# Patient Record
Sex: Female | Born: 1945 | Race: Black or African American | Hispanic: No | Smoking: Former smoker
Health system: Southern US, Community
[De-identification: ages and names within clinical notes are randomized; demographics above are authoritative.]

## PROBLEM LIST (undated history)

## (undated) DIAGNOSIS — I482 Chronic atrial fibrillation, unspecified: Secondary | ICD-10-CM

## (undated) DIAGNOSIS — Z950 Presence of cardiac pacemaker: Secondary | ICD-10-CM

## (undated) DIAGNOSIS — E0500 Thyrotoxicosis with diffuse goiter without thyrotoxic crisis or storm: Secondary | ICD-10-CM

## (undated) DIAGNOSIS — I513 Intracardiac thrombosis, not elsewhere classified: Secondary | ICD-10-CM

## (undated) DIAGNOSIS — I10 Essential (primary) hypertension: Secondary | ICD-10-CM

## (undated) DIAGNOSIS — R569 Unspecified convulsions: Secondary | ICD-10-CM

## (undated) DIAGNOSIS — I429 Cardiomyopathy, unspecified: Secondary | ICD-10-CM

## (undated) DIAGNOSIS — Z7901 Long term (current) use of anticoagulants: Secondary | ICD-10-CM

## (undated) DIAGNOSIS — I509 Heart failure, unspecified: Secondary | ICD-10-CM

## (undated) DIAGNOSIS — E039 Hypothyroidism, unspecified: Secondary | ICD-10-CM

## (undated) DIAGNOSIS — I4891 Unspecified atrial fibrillation: Secondary | ICD-10-CM

## (undated) DIAGNOSIS — I2510 Atherosclerotic heart disease of native coronary artery without angina pectoris: Secondary | ICD-10-CM

## (undated) DIAGNOSIS — C801 Malignant (primary) neoplasm, unspecified: Secondary | ICD-10-CM

## (undated) DIAGNOSIS — E079 Disorder of thyroid, unspecified: Secondary | ICD-10-CM

## (undated) DIAGNOSIS — N179 Acute kidney failure, unspecified: Secondary | ICD-10-CM

## (undated) DIAGNOSIS — M1990 Unspecified osteoarthritis, unspecified site: Secondary | ICD-10-CM

## (undated) HISTORY — PX: ABDOMINAL HYSTERECTOMY: SHX81

## (undated) HISTORY — PX: CORONARY ARTERY BYPASS GRAFT: SHX141

---

## 2015-11-17 DIAGNOSIS — Z952 Presence of prosthetic heart valve: Secondary | ICD-10-CM | POA: Insufficient documentation

## 2015-11-17 HISTORY — DX: Presence of prosthetic heart valve: Z95.2

## 2016-07-29 ENCOUNTER — Encounter (HOSPITAL_COMMUNITY): Payer: Self-pay | Admitting: Emergency Medicine

## 2016-07-29 ENCOUNTER — Emergency Department (HOSPITAL_COMMUNITY)
Admission: EM | Admit: 2016-07-29 | Discharge: 2016-07-29 | Disposition: A | Discharge status: Home / Self Care | Payer: Medicare Other | Attending: Emergency Medicine | Admitting: Emergency Medicine

## 2016-07-29 ENCOUNTER — Emergency Department (HOSPITAL_COMMUNITY): Payer: Medicare Other

## 2016-07-29 DIAGNOSIS — I509 Heart failure, unspecified: Secondary | ICD-10-CM | POA: Diagnosis not present

## 2016-07-29 DIAGNOSIS — I110 Hypertensive heart disease with heart failure: Secondary | ICD-10-CM | POA: Insufficient documentation

## 2016-07-29 DIAGNOSIS — Z951 Presence of aortocoronary bypass graft: Secondary | ICD-10-CM | POA: Diagnosis not present

## 2016-07-29 DIAGNOSIS — Z87891 Personal history of nicotine dependence: Secondary | ICD-10-CM | POA: Insufficient documentation

## 2016-07-29 DIAGNOSIS — M546 Pain in thoracic spine: Secondary | ICD-10-CM

## 2016-07-29 HISTORY — DX: Disorder of thyroid, unspecified: E07.9

## 2016-07-29 HISTORY — DX: Intracardiac thrombosis, not elsewhere classified: I51.3

## 2016-07-29 HISTORY — DX: Essential (primary) hypertension: I10

## 2016-07-29 HISTORY — DX: Acute kidney failure, unspecified: N17.9

## 2016-07-29 HISTORY — DX: Unspecified atrial fibrillation: I48.91

## 2016-07-29 HISTORY — DX: Heart failure, unspecified: I50.9

## 2016-07-29 HISTORY — DX: Cardiomyopathy, unspecified: I42.9

## 2016-07-29 HISTORY — DX: Atherosclerotic heart disease of native coronary artery without angina pectoris: I25.10

## 2016-07-29 IMAGING — CR DG THORACIC SPINE 2V
3 series · 3 of 3 positions shown · non-contrast
Comparison: None.

CLINICAL DATA: Midline back pain for 1 week.

EXAM:
THORACIC SPINE 2 VIEWS

[t thoracic spine ap]
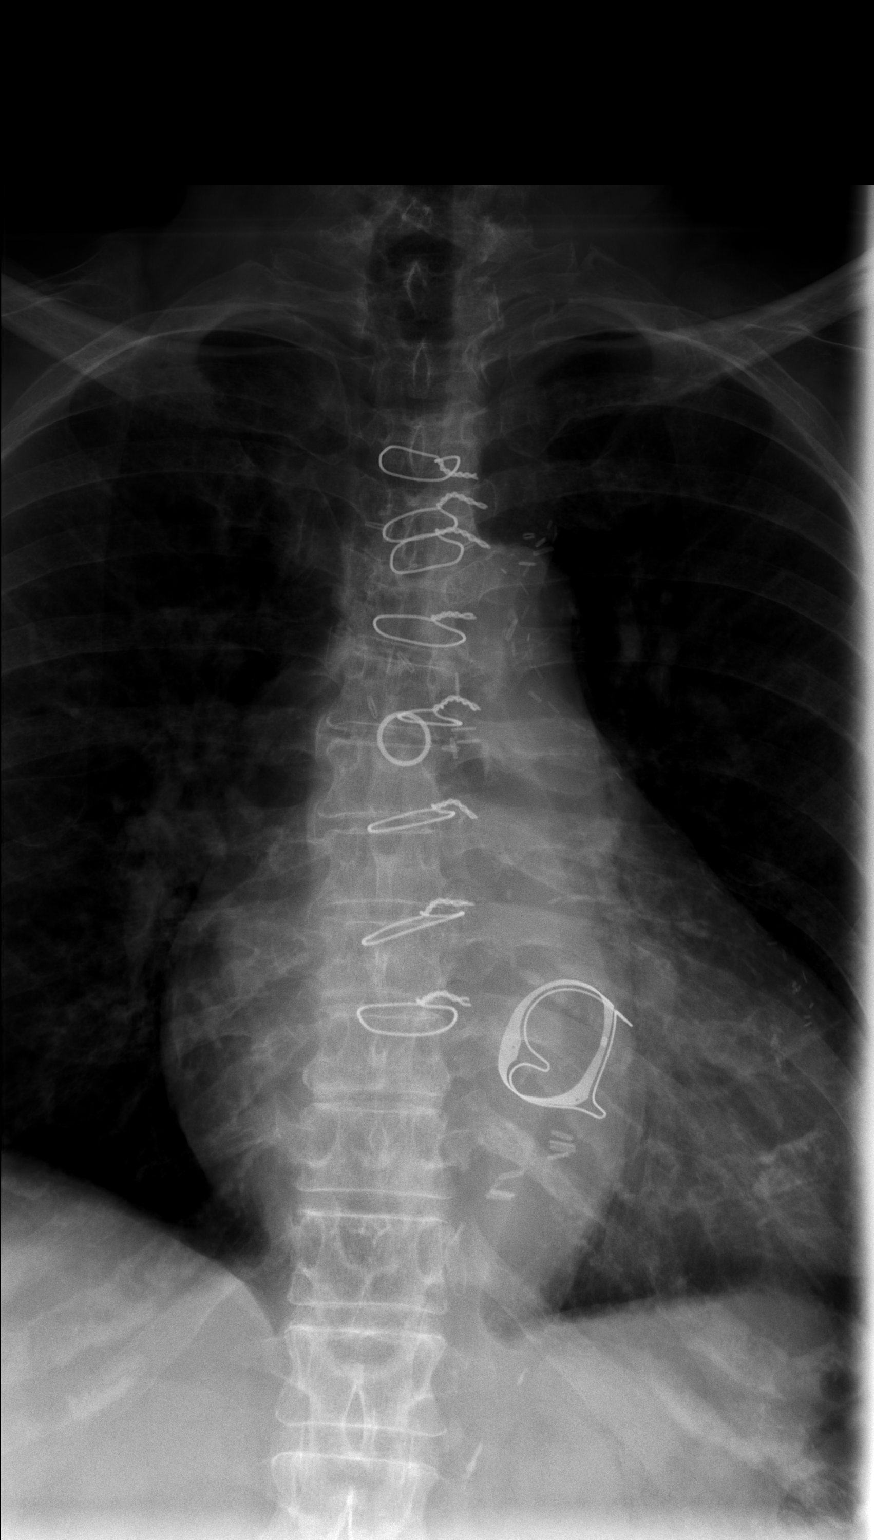

[t thoracic spine lat]
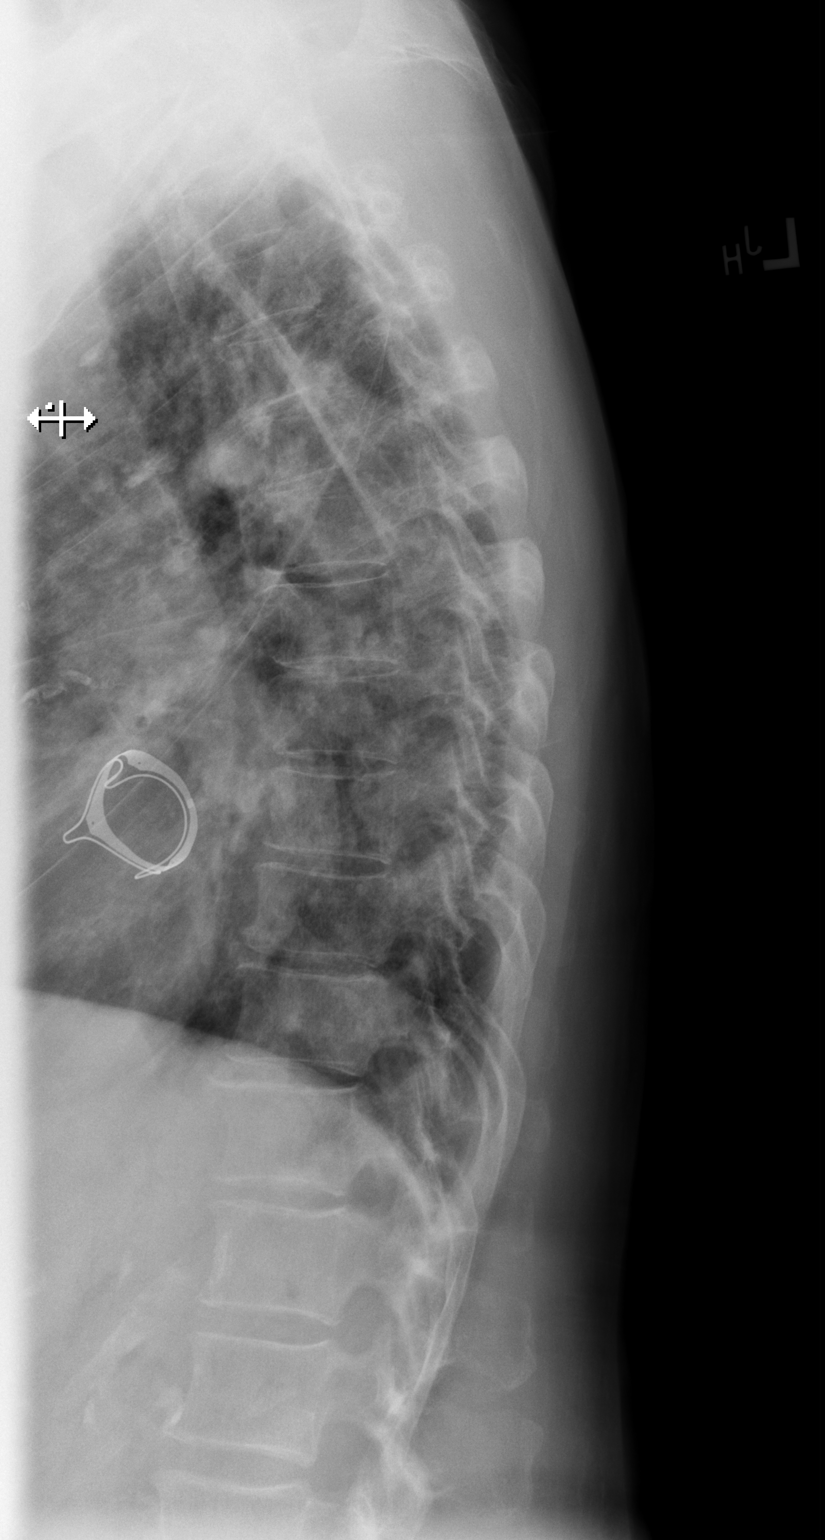

[t thoracic swimmers]
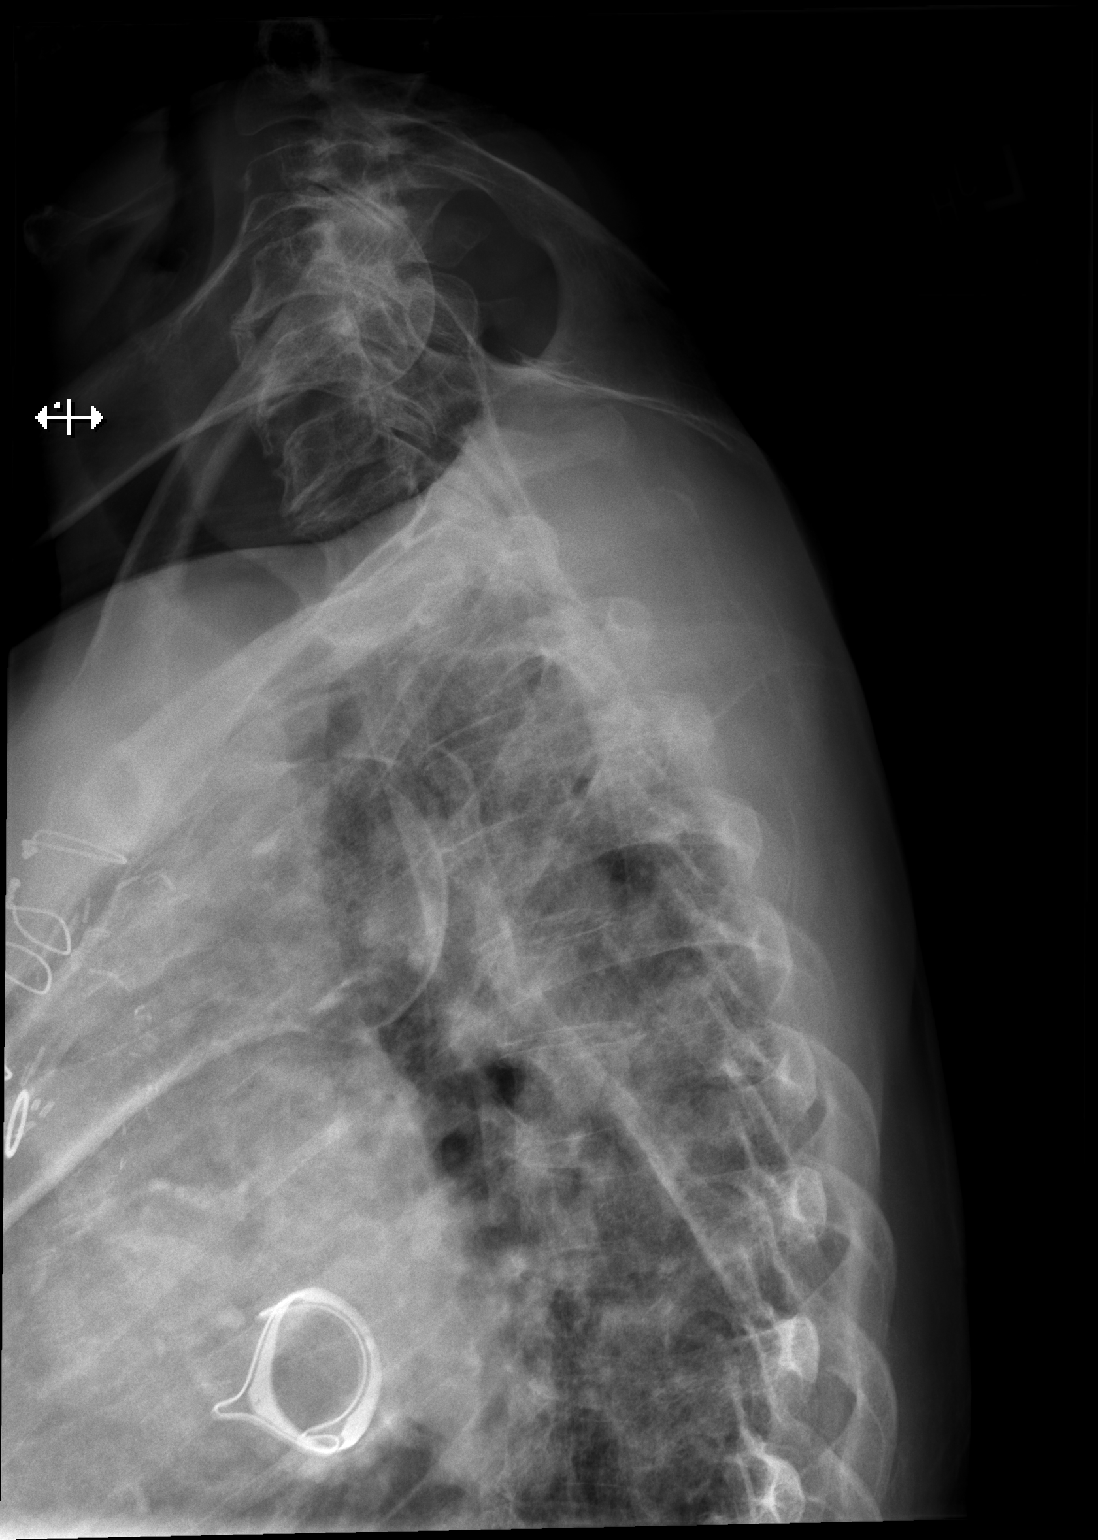

[3 of 3 positions shown; findings below may reference images not displayed]

FINDINGS: Minimal dextroscoliosis within the mid thoracic spine. Alignment is
otherwise normal. No fracture line or displaced fracture fragment
identified. No evidence of acute compression deformity. No acute or
suspicious osseous lesion.

Minimal degenerative spurring within the lower thoracic spine. No
evidence of advanced degenerative change within the thoracic spine.
Additional degenerative spurring noted within the lower cervical
spine. Visualized paravertebral soft tissues are unremarkable,
except for surgical changes of presumed CABG and cardiac valve
replacement.
IMPRESSION: 1. No acute findings.
2. Mild degenerative change, as described above.
3. Minimal scoliosis.

## 2016-07-29 MED ORDER — PREDNISONE 20 MG PO TABS: 40.0000 mg | ORAL_TABLET | Freq: Every day | ORAL | 0 refills | Status: DC

## 2016-07-29 MED ORDER — KETOROLAC TROMETHAMINE 30 MG/ML IJ SOLN
10.0000 mg | Freq: Once | INTRAMUSCULAR | Status: AC
  Administered 2016-07-29: 9.9 mg via INTRAMUSCULAR
  Filled 2016-07-29: qty 1

## 2016-07-29 MED ORDER — PREDNISONE 20 MG PO TABS
60.0000 mg | ORAL_TABLET | ORAL | Status: AC
  Administered 2016-07-29: 60 mg via ORAL
  Filled 2016-07-29: qty 3

## 2016-07-29 NOTE — ED Provider Notes (Signed)
[facility] DEPT Provider Note   CSN: [id number] Arrival date & time: 07/29/16  0532     History   Chief Complaint Chief Complaint  Patient presents with  . Back Pain    HPI Jacqueline Palmer is a 71 y.o. female.  HPI  Patient presents with concern of back pain. Pain is focally in the mid thoracic area, nonradiating, midline. Pain is sore, severe, not improved with OTC medication. Onset is atraumatic, about 3 days ago. Since that time patient has seen her cardiologist, has tried multiple OTC medication including laxative, with no relief from her back pain. Fever, no chills, no other chest pain, weakness, cough, fever, other complaints. Patient has notable history of atrial fibrillation, recent cardiac procedure as well as valve repair. She states that she takes all medication as directed, including anticoagulant.  Past Medical History:  Diagnosis Date  . Acute kidney injury ([place])   . Atrial fibrillation ([place])   . Cardiomyopathy ([place])   . CHF (congestive heart failure) ([place])   . Coronary artery disease   . Hypertension   . 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 Thyroid disease     There are no active problems to display for this patient.   Past Surgical History:  Procedure Laterality Date  . ABDOMINAL HYSTERECTOMY    . CORONARY ARTERY BYPASS GRAFT      OB History    No data available       Home Medications    Prior to Admission medications   Not on File    Family History No family history on file.  Social History Social History  Substance Use Topics  . Smoking status: Former Smoker    Types: Cigarettes    Quit date: 07/30/2014  . Smokeless tobacco: Never Used  . Alcohol use No     Allergies   Ace inhibitors and Codeine   Review of Systems Review of Systems  Constitutional:       Per HPI, otherwise negative  HENT:       Per HPI, otherwise negative  Respiratory:       Per HPI, otherwise negative    Cardiovascular:       Per HPI, otherwise negative  Gastrointestinal: Negative for vomiting.  Endocrine:       Negative aside from HPI  Genitourinary:       Neg aside from HPI   Musculoskeletal:       Per HPI, otherwise negative  Skin: Negative.   Neurological: Negative for syncope.     Physical Exam Updated Vital Signs BP 102/81 (BP Location: Left Arm)   Pulse 88   Temp 98.2 F (36.8 C) (Oral)   Resp 14   SpO2 100%   Physical Exam  Constitutional: She is oriented to person, place, and time. She appears well-developed and well-nourished. No distress.  HENT:  Head: Normocephalic and atraumatic.  Eyes: Conjunctivae and EOM are normal.  Cardiovascular: Normal rate and regular rhythm.   Pulmonary/Chest: Effort normal and breath sounds normal. No stridor. No respiratory distress.  Abdominal: She exhibits no distension.  Musculoskeletal: She exhibits no edema.  No appreciable discomfort with palpation of the midline spine, thoracic or lumbar. Patient identifies pain in the T6/T7 region. No deformity.  Neurological: She is alert and oriented to person, place, and time. No cranial nerve deficit.  Skin: Skin is warm and dry.  Psychiatric: She has a normal mood and affect.  Nursing note and vitals reviewed.  ED Treatments / Results   Radiology Dg Thoracic Spine 2 View  Result Date: 07/29/2016 CLINICAL DATA:  Midline back pain for 1 week. EXAM: THORACIC SPINE 2 VIEWS COMPARISON:  None. FINDINGS: Minimal dextroscoliosis within the mid thoracic spine. Alignment is otherwise normal. No fracture line or displaced fracture fragment identified. No evidence of acute compression deformity. No acute or suspicious osseous lesion. Minimal degenerative spurring within the lower thoracic spine. No evidence of advanced degenerative change within the thoracic spine. Additional degenerative spurring noted within the lower cervical spine. Visualized paravertebral soft tissues are unremarkable,  except for surgical changes of presumed CABG and cardiac valve replacement. IMPRESSION: 1. No acute findings. 2. Mild degenerative change, as described above. 3. Minimal scoliosis. Electronically Signed   By: Franki Cabot M.D.   On: 07/29/2016 09:07    Procedures Procedures (including critical care time)  Medications Ordered in ED Medications  predniSONE (DELTASONE) tablet 60 mg (not administered)  ketorolac (TORADOL) 30 MG/ML injection 9.9 mg (9.9 mg Intramuscular Given 07/29/16 0845)    Update: Patient in no distress on repeat exam. We discussed x-ray findings at length, as well as patient's history, she does state that she has no primary care physician, recently moved [place] this area from [place]. With no evidence for hemodynamically instability, no chest pain, no respiratory distress, low suspicion for cardiovascular etiology. Patient's specific pain in the midthoracic region suggests musculoskeletal etiology. Patient started on a short course of anti-inflammatories, will follow-up with primary care. Initial Impression / Assessment and Plan / ED Course  I have reviewed the triage vital signs and the nursing notes.  Pertinent labs & imaging results that were available during my care of the patient were reviewed by me and considered in my medical decision making (see chart for details).    Final Clinical Impressions(s) / ED Diagnoses   Final diagnoses:  Midline thoracic back pain     Carmin Muskrat, MD 07/29/16 1011

## 2016-07-29 NOTE — ED Notes (Signed)
ED Provider at bedside. 

## 2016-07-29 NOTE — ED Triage Notes (Addendum)
Pt c/o mid back pain that radiates into front epigastric area; pt seen by her cardiologist 3 days ago who suggested she try Maalox and laxatives in case symptoms were caused by constipation or indigestion; after trying Maalox and laxatives, pain was reduced, but not completely relieved

## 2016-07-29 NOTE — Discharge Instructions (Signed)
As discussed, your evaluation today has been largely reassuring.  But, it is important that you monitor your condition carefully, and do not hesitate to return to the ED if you develop new, or concerning changes in your condition. ? ?Otherwise, please follow-up with your physician for appropriate ongoing care. ? ?

## 2016-08-09 ENCOUNTER — Emergency Department (HOSPITAL_COMMUNITY): Payer: Medicare Other

## 2016-08-09 ENCOUNTER — Encounter (HOSPITAL_COMMUNITY): Payer: Self-pay

## 2016-08-09 ENCOUNTER — Inpatient Hospital Stay (HOSPITAL_COMMUNITY)
Admission: EM | Admit: 2016-08-09 | Discharge: 2016-08-15 | DRG: 291 | Disposition: A | Discharge status: Home / Self Care | Payer: Medicare Other | Attending: Interventional Cardiology | Admitting: Interventional Cardiology

## 2016-08-09 DIAGNOSIS — M1000 Idiopathic gout, unspecified site: Secondary | ICD-10-CM

## 2016-08-09 DIAGNOSIS — I2510 Atherosclerotic heart disease of native coronary artery without angina pectoris: Secondary | ICD-10-CM

## 2016-08-09 DIAGNOSIS — I482 Chronic atrial fibrillation, unspecified: Secondary | ICD-10-CM | POA: Diagnosis present

## 2016-08-09 DIAGNOSIS — K5900 Constipation, unspecified: Secondary | ICD-10-CM | POA: Diagnosis present

## 2016-08-09 DIAGNOSIS — I429 Cardiomyopathy, unspecified: Secondary | ICD-10-CM | POA: Diagnosis present

## 2016-08-09 DIAGNOSIS — M7989 Other specified soft tissue disorders: Secondary | ICD-10-CM | POA: Diagnosis not present

## 2016-08-09 DIAGNOSIS — R791 Abnormal coagulation profile: Secondary | ICD-10-CM | POA: Diagnosis present

## 2016-08-09 DIAGNOSIS — M109 Gout, unspecified: Secondary | ICD-10-CM | POA: Diagnosis present

## 2016-08-09 DIAGNOSIS — Z7901 Long term (current) use of anticoagulants: Secondary | ICD-10-CM | POA: Diagnosis not present

## 2016-08-09 DIAGNOSIS — I4892 Unspecified atrial flutter: Secondary | ICD-10-CM | POA: Diagnosis not present

## 2016-08-09 DIAGNOSIS — Z951 Presence of aortocoronary bypass graft: Secondary | ICD-10-CM | POA: Diagnosis not present

## 2016-08-09 DIAGNOSIS — I10 Essential (primary) hypertension: Secondary | ICD-10-CM

## 2016-08-09 DIAGNOSIS — Z23 Encounter for immunization: Secondary | ICD-10-CM | POA: Diagnosis present

## 2016-08-09 DIAGNOSIS — Z7982 Long term (current) use of aspirin: Secondary | ICD-10-CM | POA: Diagnosis not present

## 2016-08-09 DIAGNOSIS — R0600 Dyspnea, unspecified: Secondary | ICD-10-CM | POA: Diagnosis not present

## 2016-08-09 DIAGNOSIS — M79671 Pain in right foot: Secondary | ICD-10-CM | POA: Diagnosis not present

## 2016-08-09 DIAGNOSIS — E039 Hypothyroidism, unspecified: Secondary | ICD-10-CM | POA: Diagnosis present

## 2016-08-09 DIAGNOSIS — R600 Localized edema: Secondary | ICD-10-CM | POA: Diagnosis not present

## 2016-08-09 DIAGNOSIS — N184 Chronic kidney disease, stage 4 (severe): Secondary | ICD-10-CM | POA: Diagnosis present

## 2016-08-09 DIAGNOSIS — I4891 Unspecified atrial fibrillation: Secondary | ICD-10-CM | POA: Diagnosis not present

## 2016-08-09 DIAGNOSIS — Z87891 Personal history of nicotine dependence: Secondary | ICD-10-CM | POA: Diagnosis not present

## 2016-08-09 DIAGNOSIS — M10072 Idiopathic gout, left ankle and foot: Secondary | ICD-10-CM | POA: Diagnosis not present

## 2016-08-09 DIAGNOSIS — I130 Hypertensive heart and chronic kidney disease with heart failure and stage 1 through stage 4 chronic kidney disease, or unspecified chronic kidney disease: Principal | ICD-10-CM | POA: Diagnosis present

## 2016-08-09 DIAGNOSIS — I5023 Acute on chronic systolic (congestive) heart failure: Secondary | ICD-10-CM | POA: Diagnosis not present

## 2016-08-09 DIAGNOSIS — N183 Chronic kidney disease, stage 3 (moderate): Secondary | ICD-10-CM | POA: Diagnosis not present

## 2016-08-09 HISTORY — DX: Long term (current) use of anticoagulants: Z79.01

## 2016-08-09 HISTORY — DX: Chronic atrial fibrillation, unspecified: I48.20

## 2016-08-09 LAB — TSH: TSH: 21.073 u[IU]/mL — ABNORMAL HIGH (ref 0.350–4.500)

## 2016-08-09 LAB — CBC
HCT: 36.1 % (ref 36.0–46.0)
Hemoglobin: 12.2 g/dL (ref 12.0–15.0)
MCH: 25 pg — ABNORMAL LOW (ref 26.0–34.0)
MCHC: 33.8 g/dL (ref 30.0–36.0)
MCV: 74 fL — ABNORMAL LOW (ref 78.0–100.0)
Platelets: 232 10*3/uL (ref 150–400)
RBC: 4.88 MIL/uL (ref 3.87–5.11)
RDW: 16.9 % — ABNORMAL HIGH (ref 11.5–15.5)
WBC: 7 10*3/uL (ref 4.0–10.5)

## 2016-08-09 LAB — PROTIME-INR
INR: 2.47
Prothrombin Time: 27.2 seconds — ABNORMAL HIGH (ref 11.4–15.2)

## 2016-08-09 LAB — BRAIN NATRIURETIC PEPTIDE: B Natriuretic Peptide: 1827.3 pg/mL — ABNORMAL HIGH (ref 0.0–100.0)

## 2016-08-09 LAB — BASIC METABOLIC PANEL
Anion gap: 13 (ref 5–15)
BUN: 20 mg/dL (ref 6–20)
CO2: 22 mmol/L (ref 22–32)
Calcium: 8.9 mg/dL (ref 8.9–10.3)
Chloride: 100 mmol/L — ABNORMAL LOW (ref 101–111)
Creatinine, Ser: 2.24 mg/dL — ABNORMAL HIGH (ref 0.44–1.00)
GFR calc Af Amer: 24 mL/min — ABNORMAL LOW (ref >60)
GFR calc non Af Amer: 21 mL/min — ABNORMAL LOW (ref >60)
Glucose, Bld: 101 mg/dL — ABNORMAL HIGH (ref 65–99)
Potassium: 4.3 mmol/L (ref 3.5–5.1)
Sodium: 135 mmol/L (ref 135–145)

## 2016-08-09 LAB — TROPONIN I: Troponin I: <0.03 ng/mL (ref <0.03)

## 2016-08-09 IMAGING — DX DG CHEST 1V PORT
1 series · 1 of 1 positions shown · non-contrast
Comparison: None.

CLINICAL DATA: Hypertension, shortness of Breath

EXAM:
PORTABLE CHEST 1 VIEW

[chest ap]
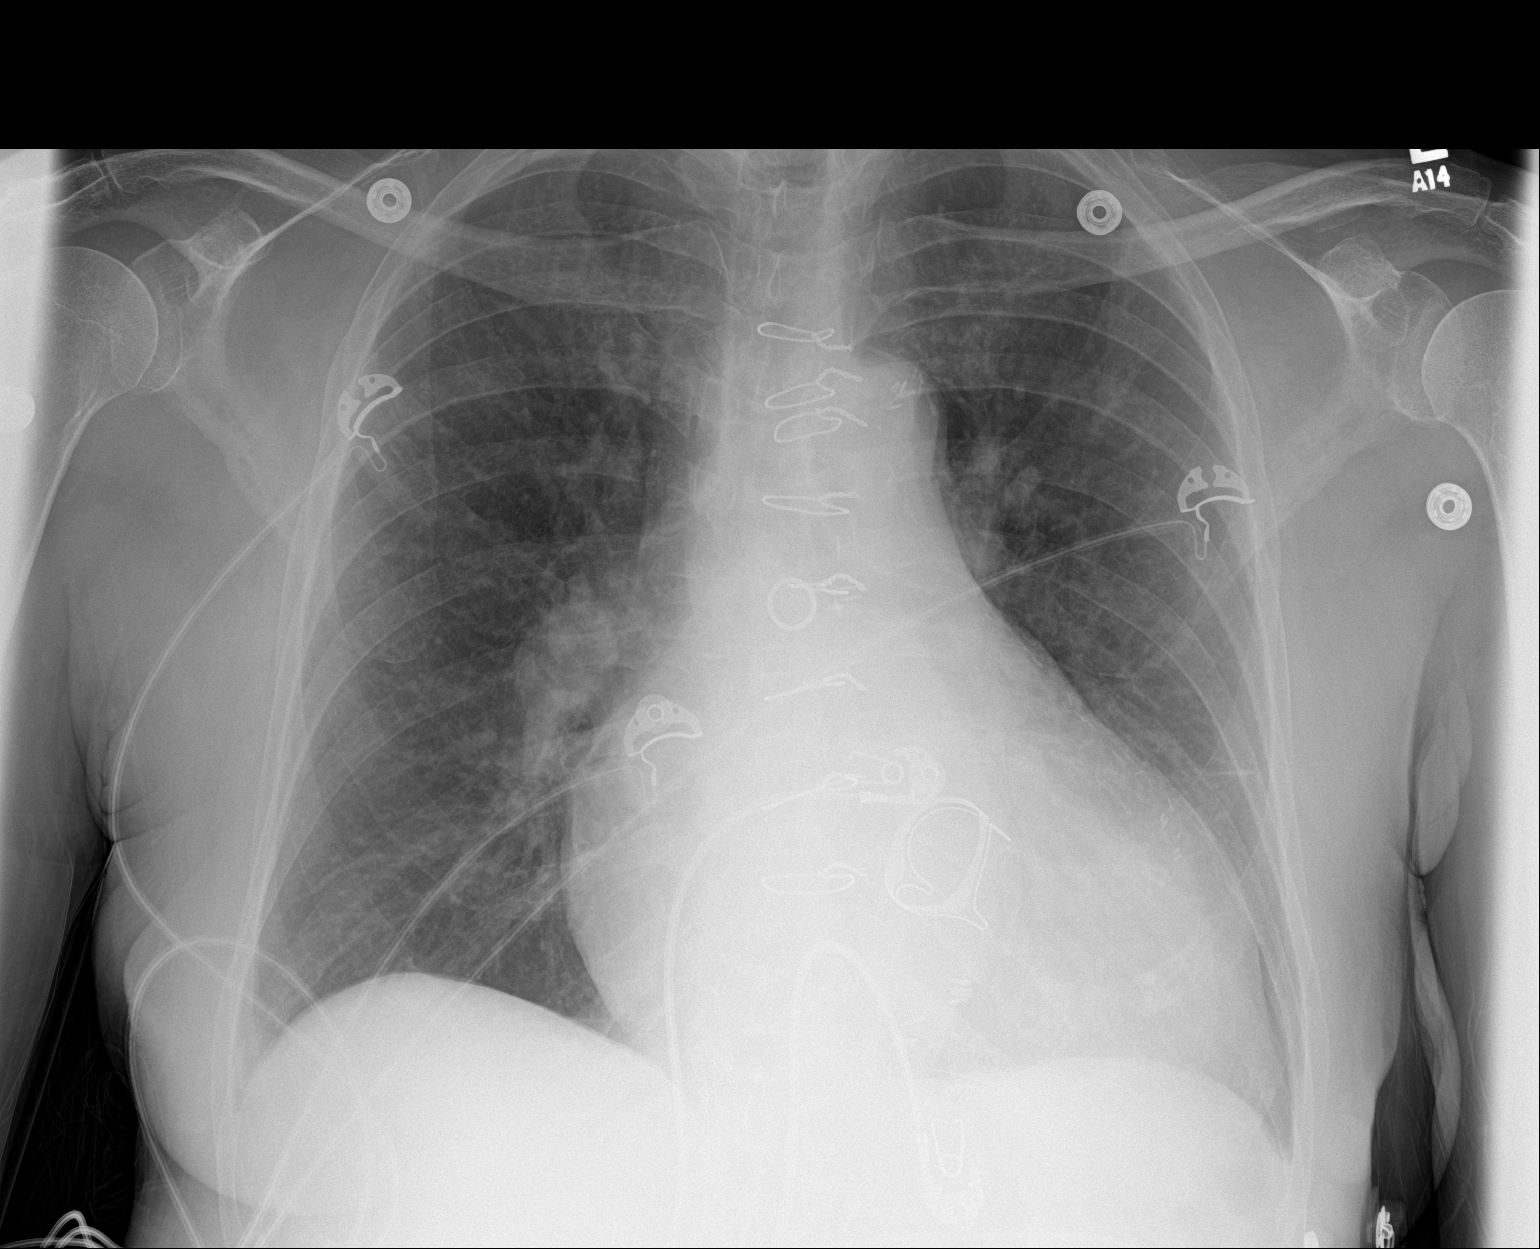

[1 of 1 positions shown; findings below may reference images not displayed]

FINDINGS: Cardiomegaly. Prior CABG and aortic valve replacement. No confluent
opacities or effusions. No acute bony abnormality.
IMPRESSION: Cardiomegaly.  No active disease.

## 2016-08-09 MED ORDER — FUROSEMIDE 10 MG/ML IJ SOLN
40.0000 mg | Freq: Every day | INTRAMUSCULAR | Status: DC
  Administered 2016-08-10 – 2016-08-11 (×2): 40 mg via INTRAVENOUS
  Filled 2016-08-09 (×2): qty 4

## 2016-08-09 MED ORDER — ASPIRIN EC 81 MG PO TBEC
81.0000 mg | DELAYED_RELEASE_TABLET | Freq: Every day | ORAL | Status: DC
  Administered 2016-08-09 – 2016-08-15 (×7): 81 mg via ORAL
  Filled 2016-08-09 (×7): qty 1

## 2016-08-09 MED ORDER — METOPROLOL SUCCINATE ER 50 MG PO TB24
75.0000 mg | ORAL_TABLET | Freq: Every day | ORAL | Status: DC
  Administered 2016-08-10 – 2016-08-15 (×6): 75 mg via ORAL
  Filled 2016-08-09 (×8): qty 1

## 2016-08-09 MED ORDER — SODIUM CHLORIDE 0.9 % IV SOLN: 250.0000 mL | INTRAVENOUS | Status: DC | PRN

## 2016-08-09 MED ORDER — HYDRALAZINE HCL 10 MG PO TABS
10.0000 mg | ORAL_TABLET | Freq: Three times a day (TID) | ORAL | Status: DC
  Administered 2016-08-09 – 2016-08-15 (×13): 10 mg via ORAL
  Filled 2016-08-09 (×17): qty 1

## 2016-08-09 MED ORDER — LEVOTHYROXINE SODIUM 25 MCG PO TABS
25.0000 ug | ORAL_TABLET | Freq: Every day | ORAL | Status: DC
  Administered 2016-08-10 – 2016-08-15 (×6): 25 ug via ORAL
  Filled 2016-08-09 (×6): qty 1

## 2016-08-09 MED ORDER — MORPHINE SULFATE (PF) 4 MG/ML IV SOLN
4.0000 mg | Freq: Once | INTRAVENOUS | Status: AC
  Administered 2016-08-09: 4 mg via INTRAVENOUS
  Filled 2016-08-09: qty 1

## 2016-08-09 MED ORDER — ONDANSETRON HCL 4 MG/2ML IJ SOLN: 4.0000 mg | Freq: Four times a day (QID) | INTRAMUSCULAR | Status: DC | PRN

## 2016-08-09 MED ORDER — FENTANYL CITRATE (PF) 100 MCG/2ML IJ SOLN
100.0000 ug | Freq: Once | INTRAMUSCULAR | Status: AC
  Administered 2016-08-09: 100 ug via INTRAVENOUS
  Filled 2016-08-09: qty 2

## 2016-08-09 MED ORDER — SPIRONOLACTONE 25 MG PO TABS: 25.0000 mg | ORAL_TABLET | Freq: Every day | ORAL | Status: DC

## 2016-08-09 MED ORDER — AMIODARONE HCL 200 MG PO TABS
200.0000 mg | ORAL_TABLET | Freq: Every day | ORAL | Status: DC
  Administered 2016-08-09 – 2016-08-15 (×7): 200 mg via ORAL
  Filled 2016-08-09 (×7): qty 1

## 2016-08-09 MED ORDER — WARFARIN SODIUM 6 MG PO TABS
6.0000 mg | ORAL_TABLET | Freq: Once | ORAL | Status: AC
  Administered 2016-08-09: 6 mg via ORAL
  Filled 2016-08-09: qty 1

## 2016-08-09 MED ORDER — FUROSEMIDE 10 MG/ML IJ SOLN
40.0000 mg | Freq: Once | INTRAMUSCULAR | Status: AC
  Administered 2016-08-09: 40 mg via INTRAVENOUS
  Filled 2016-08-09: qty 4

## 2016-08-09 MED ORDER — PRAVASTATIN SODIUM 20 MG PO TABS
20.0000 mg | ORAL_TABLET | Freq: Every day | ORAL | Status: DC
  Administered 2016-08-09 – 2016-08-15 (×7): 20 mg via ORAL
  Filled 2016-08-09 (×7): qty 1

## 2016-08-09 MED ORDER — SODIUM CHLORIDE 0.9% FLUSH
3.0000 mL | INTRAVENOUS | Status: DC | PRN
  Administered 2016-08-11: 3 mL via INTRAVENOUS
  Filled 2016-08-09: qty 3

## 2016-08-09 MED ORDER — WARFARIN - PHARMACIST DOSING INPATIENT
Freq: Every day | Status: DC
  Administered 2016-08-10 – 2016-08-11 (×2)

## 2016-08-09 MED ORDER — SODIUM CHLORIDE 0.9% FLUSH
3.0000 mL | Freq: Two times a day (BID) | INTRAVENOUS | Status: DC
  Administered 2016-08-09 – 2016-08-15 (×11): 3 mL via INTRAVENOUS

## 2016-08-09 NOTE — ED Provider Notes (Addendum)
[facility] DEPT Provider Note   CSN: [id number] Arrival date & time: 08/09/16  0901     History   Chief Complaint Chief Complaint  Patient presents with  . Leg Pain  . Shortness of Breath    HPI Jacqueline Palmer is a 71 y.o. female.Complains of bilateral leg pain and swelling right greater than left gradual onset 5 days ago. Symptoms accompanied by shortness of breath which has since improved, without treatment. Patient reports her INR was 8 last week when checked at home she withheld her Coumadin until yesterday. She denies any chest pain denies cough denies other associated symptoms. No treatment prior to coming here.  HPI  Past Medical History:  Diagnosis Date  . Acute kidney injury ([place])   . Atrial fibrillation ([place])   . Cardiomyopathy ([place])   . CHF (congestive heart failure) ([place])   . Coronary artery disease   . Hypertension   . 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 Thyroid disease     There are no active problems to display for this patient.   Past Surgical History:  Procedure Laterality Date  . ABDOMINAL HYSTERECTOMY    . CORONARY ARTERY BYPASS GRAFT      OB History    No data available       Home Medications    Prior to Admission medications   Medication Sig Start Date End Date Taking? Authorizing Provider  predniSONE (DELTASONE) 20 MG tablet Take 2 tablets (40 mg total) by mouth daily with breakfast. For the next four days 07/29/16   Carmin Muskrat, MD    Family History No family history on file.  Social History Social History  Substance Use Topics  . Smoking status: Former Smoker    Types: Cigarettes    Quit date: 07/30/2014  . Smokeless tobacco: Never Used  . Alcohol use No     Allergies   Ace inhibitors and Codeine   Review of Systems Review of Systems  Respiratory: Positive for shortness of breath.   Cardiovascular: Positive for leg swelling.  All other systems reviewed and are  negative.    Physical Exam Updated Vital Signs BP (!) 114/95 (BP Location: Right Arm)   Pulse (!) 124   Temp 98.2 F (36.8 C) (Oral)   Resp 19   Ht 5\' 8"  (1.727 m)   Wt 170 lb (77.1 kg)   SpO2 98%   BMI 25.85 kg/m   Physical Exam  Constitutional: She is oriented to person, place, and time. She appears well-developed and well-nourished. No distress.  HENT:  Head: Normocephalic and atraumatic.  Eyes: Conjunctivae are normal. Pupils are equal, round, and reactive to light.  Neck: Neck supple. JVD present. No tracheal deviation present. No thyromegaly present.  Cardiovascular:  No murmur heard. Tachycardic irregularly irregular  Pulmonary/Chest: Effort normal and breath sounds normal.  Abdominal: Soft. Bowel sounds are normal. She exhibits no distension. There is no tenderness.  Musculoskeletal: Normal range of motion. She exhibits edema. She exhibits no tenderness.  2+ pretibial pitting edema bilaterally  Neurological: She is alert and oriented to person, place, and time. Coordination normal.  Skin: Skin is warm and dry. No rash noted.  Psychiatric: She has a normal mood and affect.  Nursing note and vitals reviewed.    ED Treatments / Results  Labs (all labs ordered are listed, but only abnormal results are displayed) Labs Reviewed  BRAIN NATRIURETIC PEPTIDE  PROTIME-INR  CBC  BASIC METABOLIC PANEL  EKG  EKG Interpretation  Date/Time:  Wednesday August 09 2016 09:14:09 EDT Ventricular Rate:  122 PR Interval:    QRS Duration: 163 QT Interval:  395 QTC Calculation: 563 R Axis:   -162 Text Interpretation:  Atrial fibrillation Anterior infarct, acute (LAD) Prolonged QT interval Left bundle branch block No old tracing to compare Confirmed by Winfred Leeds  MD, Fadi Menter [phone]) on 08/09/2016 9:19:04 AM       Radiology No results found. Chest x-ray viewed by me Procedures Procedures (including critical care time)  Medications Ordered in ED Medications  morphine 4  MG/ML injection 4 mg (not administered)  fentaNYL (SUBLIMAZE) injection 100 mcg (not administered)   10:25 AM pain improved after treatment with intravenous fentanyl Patient had echocardiogram performed in [place] system 06/06/2016 which showed ejection fraction of 18%  with renal insufficiency which is chronic. Elevated BNP and dyspnea with neck vein distention or peripheral edema consistent with congestive heart failure. Lasix IV ordered. Cardiology Service consulted as patient has low ejection fraction and atrial fibrillation with mildly elevated rate.. Cardiology service arranged for overnight stay and IV Lasix ordered by me Initial Impression / Assessment and Plan / ED Course  I have reviewed the triage vital signs and the nursing notes. Chads vasc score =4 Pertinent labs & imaging results that were available during my care of the patient were reviewed by me and considered in my medical decision making (see chart for details).     Results for orders placed or performed during the hospital encounter of 08/09/16  Brain natriuretic peptide  Result Value Ref Range   B Natriuretic Peptide 1,827.3 (H) 0.0 - 100.0 pg/mL  Protime-INR  Result Value Ref Range   Prothrombin Time 27.2 (H) 11.4 - 15.2 seconds   INR 2.47   CBC  Result Value Ref Range   WBC 7.0 4.0 - 10.5 K/uL   RBC 4.88 3.87 - 5.11 MIL/uL   Hemoglobin 12.2 12.0 - 15.0 g/dL   HCT 36.1 36.0 - 46.0 %   MCV 74.0 (L) 78.0 - 100.0 fL   MCH 25.0 (L) 26.0 - 34.0 pg   MCHC 33.8 30.0 - 36.0 g/dL   RDW 16.9 (H) 11.5 - 15.5 %   Platelets 232 150 - 400 K/uL  Basic metabolic panel  Result Value Ref Range   Sodium 135 135 - 145 mmol/L   Potassium 4.3 3.5 - 5.1 mmol/L   Chloride 100 (L) 101 - 111 mmol/L   CO2 22 22 - 32 mmol/L   Glucose, Bld 101 (H) 65 - 99 mg/dL   BUN 20 6 - 20 mg/dL   Creatinine, Ser 2.24 (H) 0.44 - 1.00 mg/dL   Calcium 8.9 8.9 - 10.3 mg/dL   GFR calc non Af Amer 21 (L) >60 mL/min   GFR calc Af Amer 24 (L) >60  mL/min   Anion gap 13 5 - 15  Troponin I  Result Value Ref Range   Troponin I <0.03 <0.03 ng/mL   Dg Thoracic Spine 2 View  Result Date: 07/29/2016 CLINICAL DATA:  Midline back pain for 1 week. EXAM: THORACIC SPINE 2 VIEWS COMPARISON:  None. FINDINGS: Minimal dextroscoliosis within the mid thoracic spine. Alignment is otherwise normal. No fracture line or displaced fracture fragment identified. No evidence of acute compression deformity. No acute or suspicious osseous lesion. Minimal degenerative spurring within the lower thoracic spine. No evidence of advanced degenerative change within the thoracic spine. Additional degenerative spurring noted within the lower cervical spine. Visualized paravertebral soft  tissues are unremarkable, except for surgical changes of presumed CABG and cardiac valve replacement. IMPRESSION: 1. No acute findings. 2. Mild degenerative change, as described above. 3. Minimal scoliosis. Electronically Signed   By: Franki Cabot M.D.   On: 07/29/2016 09:07   Dg Chest Port 1 View  Result Date: 08/09/2016 CLINICAL DATA:  Hypertension, shortness of Breath EXAM: PORTABLE CHEST 1 VIEW COMPARISON:  None. FINDINGS: Cardiomegaly. Prior CABG and aortic valve replacement. No confluent opacities or effusions. No acute bony abnormality. IMPRESSION: Cardiomegaly.  No active disease. Electronically Signed   By: Rolm Baptise M.D.   On: 08/09/2016 10:09     Final Clinical Impressions(s) / ED Diagnoses  dx #1 congestive heart failure, acute on chronic Final diagnoses:  None    New Prescriptions New Prescriptions   No medications on file     Orlie Dakin, MD 08/09/16 [place], MD 08/09/16 [place], MD 08/09/16 1319

## 2016-08-09 NOTE — ED Notes (Addendum)
Pt given water per cardiology

## 2016-08-09 NOTE — H&P (Signed)
History & Physical    Patient ID: Jacqueline Palmer MRN: [id number], DOB/AGE: [birth year]   Admit date: 08/09/2016   Primary Physician: No PCP Per Patient Primary Cardiologist: [facility]  Patient Profile    71 yo female with PMH of permanent afib s/p ablation and MAZE with left atrial appendage ligation, chronic systolic HF, HTN, s/p bioprosthetic MVR with 4v CABG who presented with increasing dyspnea and LE edema.   Past Medical History    Past Medical History:  Diagnosis Date  . Acute kidney injury ([place])   . Atrial fibrillation ([place])   . Cardiomyopathy ([place])   . CHF (congestive heart failure) ([place])   . Coronary artery disease   . Hypertension   . 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 Thyroid disease     Past Surgical History:  Procedure Laterality Date  . ABDOMINAL HYSTERECTOMY    . CORONARY ARTERY BYPASS GRAFT       Allergies  Allergies  Allergen Reactions  . Ace Inhibitors Cough  . Codeine Other (See Comments)    Nausea and Feels Jittery    History of Present Illness    Jacqueline Palmer is a 71 yo female with extensive cardiac hx. Reports she was living in [place] until last year. Back in 6/17 she underwent multiple procedures including MVR, 4v CABG, MAZE procedure with left atrial appendage ligation. States while at [place], the plan was to attempt DCCV, but noted to have a LA thrombus, thus this was aborted. Noted to have ICM with EF of 35% at that time. Moved here back in September '17 and established with cardiology at [facility]. Was admitted for 14 around that time for acute HF and had lbs of fluid removed. Currently following with cardiology at [facility] still, and last visit was a couple of weeks ago. Her EF was noted to still be down at 20% and discussion was had about placing BiVi device in the near future. At this visit she reported ongoing abd pain, and was sent for a abd/aorta duplex noting no aneurysm or calcifications. Reports this  pain improved with the use of laxatives and Maloxx.   Currently fairly active, goes to the gym 3 times a weeks and walks on the treadmill for 20 minutes. Has a cane, but does not normally need it. Started noticing swelling in bilateral lower extremities along with pain in the ankles and feet. Does not weigh on regular basis, but thinks dry weight is around 168lbs. Swelling continued, and developed orthopnea over the past 2 days. Sitting up in bed to sleep recently. Presented to the ED today with symptoms. No chest pain, n/v, abd pain, dizziness or lightheadedness. Of note last INR check with [facility] was 8, and she held her coumadin until today.   In the ED labs showed stable electrolytes, Cr 2.24 (around her baseline), BNP 1827, Hgb 12.2, INR 2.47. CXR with known cardiomegaly, but no edema. EKG showed AF, no previous to compare. She was given 40mg  IV lasix x1 in the ED, no UOP yet.   Home Medications    Prior to Admission medications   Medication Sig Start Date End Date Taking? Authorizing Provider  amiodarone (PACERONE) 200 MG tablet Take 200 mg by mouth daily.   Yes Historical Provider, MD  aspirin 81 MG chewable tablet Chew 81 mg by mouth daily.   Yes Historical Provider, MD  hydrALAZINE (APRESOLINE) 10 MG tablet Take 10 mg by mouth 3 (three) times daily.   Yes Historical  Provider, MD  levothyroxine (SYNTHROID, LEVOTHROID) 25 MCG tablet Take 25 mcg by mouth daily before breakfast.   Yes Historical Provider, MD  metoprolol succinate (TOPROL-XL) 50 MG 24 hr tablet Take 75 mg by mouth daily. Take with or immediately following a meal.   Yes Historical Provider, MD  potassium chloride (K-DUR,KLOR-CON) 10 MEQ tablet Take 10 mEq by mouth daily.   Yes Historical Provider, MD  pravastatin (PRAVACHOL) 20 MG tablet Take 20 mg by mouth daily.   Yes Historical Provider, MD  spironolactone (ALDACTONE) 25 MG tablet Take 25 mg by mouth daily.   Yes Historical Provider, MD  torsemide (DEMADEX) 20 MG tablet Take 40  mg by mouth daily.   Yes Historical Provider, MD  warfarin (COUMADIN) 5 MG tablet Take 5 mg by mouth daily.   Yes Historical Provider, MD  predniSONE (DELTASONE) 20 MG tablet Take 2 tablets (40 mg total) by mouth daily with breakfast. For the next four days Patient not taking: Reported on 08/09/2016 07/29/16   Carmin Muskrat, MD    Family History    No family history on file.  Social History    Social History   Social History  . Marital status: Widowed    Spouse name: N/A  . Number of children: N/A  . Years of education: N/A   Occupational History  . Not on file.   Social History Main Topics  . Smoking status: Former Smoker    Types: Cigarettes    Quit date: 07/30/2014  . Smokeless tobacco: Never Used  . Alcohol use No  . Drug use: No  . Sexual activity: Not on file   Other Topics Concern  . Not on file   Social History Narrative  . No narrative on file     Review of Systems    General:  No chills, fever, night sweats ++ weight changes.  Cardiovascular:  See HPI Dermatological: No rash, lesions/masses Respiratory: No cough, ++ dyspnea Urologic: No hematuria, dysuria Abdominal:   No nausea, vomiting, diarrhea, bright red blood per rectum, melena, or hematemesis Neurologic:  No visual changes, wkns, changes in mental status. All other systems reviewed and are otherwise negative except as noted above.  Physical Exam    Blood pressure (!) 109/97, pulse (!) 107, temperature 98.2 F (36.8 C), temperature source Oral, resp. rate (!) 24, height 5\' 8"  (1.727 m), weight 170 lb (77.1 kg), SpO2 98 %.  General: Pleasant older AAF, NAD Psych: Normal affect. Neuro: Alert and oriented X 3. Moves all extremities spontaneously. HEENT: Normal  Neck: Supple without bruits or JVD. Lungs:  Resp regular and unlabored, CTA. Heart: Irreg Irreg no s3, s4, or murmurs. Abdomen: Soft, non-tender, non-distended, BS + x 4.  Extremities: No clubbing, cyanosis, 1+ PT bilateral edema.  DP/PT/Radials 2+ and equal bilaterally.  Labs    Troponin (Point of Care Test) No results for input(s): TROPIPOC in the last 72 hours.  Recent Labs  08/09/16 1004  TROPONINI <0.03   Lab Results  Component Value Date   WBC 7.0 08/09/2016   HGB 12.2 08/09/2016   HCT 36.1 08/09/2016   MCV 74.0 (L) 08/09/2016   PLT 232 08/09/2016    Recent Labs Lab 08/09/16 1004  NA 135  K 4.3  CL 100*  CO2 22  BUN 20  CREATININE 2.24*  CALCIUM 8.9  GLUCOSE 101*   No results found for: CHOL, HDL, LDLCALC, TRIG No results found for: [facility]   Radiology Studies    Dg Thoracic Spine  2 View  Result Date: 07/29/2016 CLINICAL DATA:  Midline back pain for 1 week. EXAM: THORACIC SPINE 2 VIEWS COMPARISON:  None. FINDINGS: Minimal dextroscoliosis within the mid thoracic spine. Alignment is otherwise normal. No fracture line or displaced fracture fragment identified. No evidence of acute compression deformity. No acute or suspicious osseous lesion. Minimal degenerative spurring within the lower thoracic spine. No evidence of advanced degenerative change within the thoracic spine. Additional degenerative spurring noted within the lower cervical spine. Visualized paravertebral soft tissues are unremarkable, except for surgical changes of presumed CABG and cardiac valve replacement. IMPRESSION: 1. No acute findings. 2. Mild degenerative change, as described above. 3. Minimal scoliosis. Electronically Signed   By: Franki Cabot M.D.   On: 07/29/2016 09:07   Dg Chest Port 1 View  Result Date: 08/09/2016 CLINICAL DATA:  Hypertension, shortness of Breath EXAM: PORTABLE CHEST 1 VIEW COMPARISON:  None. FINDINGS: Cardiomegaly. Prior CABG and aortic valve replacement. No confluent opacities or effusions. No acute bony abnormality. IMPRESSION: Cardiomegaly.  No active disease. Electronically Signed   By: Rolm Baptise M.D.   On: 08/09/2016 10:09    ECG & Cardiac Imaging    EKG: Afib RVR, known LBBB  Echo: 1/18  ([facility])  AORTIC ROOT Size: DILATED Dissection: INDETERM FOR DISSECTION AORTIC VALVE Leaflets: Tricuspid Morphology: Normal Mobility: Fully Mobile LEFT VENTRICLEAnterior: HYPOCONTRACTILE Size: Normal Lateral: HYPOCONTRACTILE  Contraction: SEVERE GLOBAL DECREASESeptal: DYSKINETIC Closest EF: 20% (Estimated)Calc.EF: 18% (3D)Apical: HYPOCONTRACTILE  LV masses: No Masses Inferior: AKINETIC  LVH: MILD LVH CONCENTRICPosterior: HYPOCONTRACTILE  LV GLS(LOL):-2.4% Dias.FxClass: N/A MITRAL VALVE Leaflets: ABNORMALMobility: Fully mobile Morphology: BIOPROSTHETIC LEFT ATRIUM Size: SEVERELY ENLARGED  LA masses: No masses Normal IAS MAIN PA Size: DILATED PULMONIC VALVE Morphology: Normal Mobility: Fully Mobile RIGHT VENTRICLE Size: MILDLY ENLARGED Free wall: HYPOCONTRACTILE  Contraction: MILD GLOBAL DECREASERV masses: No Masses  TAPSE: 1.0 cm,Normal Range [>= 1.6 cm] TRICUSPID VALVE Leaflets: NormalMobility: Fully mobile Morphology: Normal  TV Note: LEAFLETS DO NOT COAPT RIGHT ATRIUM Size: SEVERELY ENLARGEDRA Other: None  RA masses: No masses PERICARDIUM  Fluid: No effusion  Pleural Eff: PLEURAL EFFUSION NOTED INFERIOR VENACAVA Size: DILATEDABNORMAL RESPIRATORY COLLAPSE DOPPLER ECHO and OTHER SPECIAL PROCEDURES ------------------------------------  Aortic: MILD ARNo AS  Mitral: MILD MRBIOPROSTHETIC MV 1.7 m/s peak vel 11 mmHg peak grad 5 mmHg mean grad  MV Inflow E Vel.= nm* cm/sMV Annulus E'Vel.= nm* cm/sE/E'Ratio= nm* Tricuspid: SEVERE TRNo TS  2.4 m/s peak TR  vel 38 mmHg peak RV pressure Pulmonary: TRIVIAL PR No PS  INTERPRETATION --------------------------------------------------------------- SEVERE LV DYSFUNCTION (See above) WITH MILD LVH MILD RV SYSTOLIC DYSFUNCTION (See above) VALVULAR REGURGITATION: MILD AR, MILD MR, TRIVIAL PR, SEVERE TR PROSTHETIC VALVE(S): BIOPROSTHETIC MV 3D acquisition and reconstructions were performed as part of this examination to more accurately quantify the effects of reduced left ventricular ejection fraction.  Assessment & Plan    71 yo female with PMH of permanent afib s/p ablation and MAZE with left atrial appendage ligation, chronic systolic HF, HTN, s/p bioprosthetic MVR with 4v CABG who presented with increasing dyspnea and LE edema.  1. Acute on Chronic systolic HF: Known EF of 76% on last echo 1/18. Reports being compliant with her home medications, but alittle lack with her diet. Had a BLT sandwich, increased fluid intake. Noticed swelling and bilateral foot/ankle pain on Friday. Continued to worsen, with orthopnea. BNP 1827, CXR with no edema, suspect her BNP may be chronically elevated due to CKD though do not have previous lab to compare. Given 40mg  IV lasix  x1 in the ED.  -- Will continue with IV lasix 40mg  daily -- I&Os with daily weights -- counseled on diet and fluid intake -- plans to have BiVi placed with EP at [facility] next month, will hold on repeat echo as last was in 1/18.  2. Permanent Afib: Rates slightly elevated on admission in the setting of acute HF.  -- continue amiodarone, and metoprolol for rate control -- coumadin per pharmacy -- check TSH  3. HTN: Stable with current therapy  4. CAD s/p 4v CABG: No anginal symptoms. Continue with medical therapy.   Barnet Pall, NP-C Pager [phone] 08/09/2016, 12:54 PM   I have examined the patient and reviewed assessment and plan and discussed with patient.  Agree with above as stated.  Increased  edema of late.  Pain worse in the right foot.  I suspect she has another flare of gout.  Her pain is currently better.  Would not use NSAIDs but could start another prednisone taper.  If pain comes back, would give a dose of Solumedrol 60 mg IV x1.  THen could start an oral taper.  \  Longterm, her cardiology f/u is at [facility].  She is scheduled for a BiV-ICD. Treat volume overload with diuretic as well.   Larae Grooms

## 2016-08-09 NOTE — ED Notes (Signed)
Report attempted 

## 2016-08-09 NOTE — ED Notes (Signed)
Dinner tray ordered.

## 2016-08-09 NOTE — ED Triage Notes (Signed)
Pt brought in by EMS due to having BLE edema and pain. Pt endorse SOB and weakness. Pt denies chest pain and n/v/d. Per EMS pt was in a.fib RVR rates 120-130's. Pt has hx of a.fib. Pt takes coumadin. Pt a&ox4.

## 2016-08-09 NOTE — Progress Notes (Signed)
ANTICOAGULATION CONSULT NOTE - Initial Consult  Pharmacy Consult for Warfarin Indication: atrial fibrillation w/ MVR  Allergies  Allergen Reactions  . Ace Inhibitors Cough  . Codeine Other (See Comments)    Nausea and Feels Jittery    Patient Measurements: Height: 5\' 8"  (172.7 cm) Weight: 170 lb (77.1 kg) IBW/kg (Calculated) : 63.9  Vital Signs: Temp: 98.2 F (36.8 C) (03/21 0917) Temp Source: Oral (03/21 0917) BP: 106/89 (03/21 1432) Pulse Rate: 95 (03/21 1432)  Labs:  Recent Labs  08/09/16 1004  HGB 12.2  HCT 36.1  PLT 232  LABPROT 27.2*  INR 2.47  CREATININE 2.24*  TROPONINI <0.03    Estimated Creatinine Clearance: 25.5 mL/min (A) (by C-G formula based on SCr of 2.24 mg/dL (H)).   Medical History: Past Medical History:  Diagnosis Date  . Acute kidney injury ([place])   . Atrial fibrillation ([place])   . Cardiomyopathy ([place])   . CHF (congestive heart failure) ([place])   . Coronary artery disease    4v CABG 6/17  . Hypertension   . 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 Thyroid disease     Medications:   (Not in a hospital admission)  Assessment: Patient is a 71 year old female admitted 3/21 w/ heart failure exacerbation. She is on chronic warfarin therapy PTA for a fib and MVR. Patient had recent elevated INR on 3/14 of 8.0, which she stated was due to drug interaction with prednisone. She was instructed to hold her warfarin for a few days, and she restarted her previous this past weekend (Sat or Sun). She is no longer taking the prednisone.   Admission INR was slightly subtherapeutic at 2.47. Baseline CBC shows hemoglobin and platelet count within normal limits. No bleeding noted.   PTA dose: 2.5mg  Tues, Thurs, and Sat and 5mg  all other days per patient and [facility] records  Will give small boost does tonight.   Goal of Therapy:  INR 2.5-3.5 Monitor platelets by anticoagulation protocol: Yes   Plan:  Warfarin 6mg  PO  x1 tonight Daily INRs Monitor signs/symptoms of bleeding   Demetrius Charity, PharmD Acute Care Pharmacy Resident  Pager: [phone] 08/09/2016

## 2016-08-10 ENCOUNTER — Encounter (HOSPITAL_COMMUNITY): Payer: Self-pay | Admitting: General Practice

## 2016-08-10 ENCOUNTER — Inpatient Hospital Stay (HOSPITAL_COMMUNITY): Payer: Medicare Other

## 2016-08-10 DIAGNOSIS — N183 Chronic kidney disease, stage 3 (moderate): Secondary | ICD-10-CM

## 2016-08-10 DIAGNOSIS — I4892 Unspecified atrial flutter: Secondary | ICD-10-CM

## 2016-08-10 DIAGNOSIS — R600 Localized edema: Secondary | ICD-10-CM

## 2016-08-10 LAB — BASIC METABOLIC PANEL
Anion gap: 13 (ref 5–15)
BUN: 20 mg/dL (ref 6–20)
CO2: 22 mmol/L (ref 22–32)
Calcium: 8.8 mg/dL — ABNORMAL LOW (ref 8.9–10.3)
Chloride: 98 mmol/L — ABNORMAL LOW (ref 101–111)
Creatinine, Ser: 2.1 mg/dL — ABNORMAL HIGH (ref 0.44–1.00)
GFR calc Af Amer: 26 mL/min — ABNORMAL LOW (ref >60)
GFR calc non Af Amer: 23 mL/min — ABNORMAL LOW (ref >60)
Glucose, Bld: 92 mg/dL (ref 65–99)
Potassium: 4.3 mmol/L (ref 3.5–5.1)
Sodium: 133 mmol/L — ABNORMAL LOW (ref 135–145)

## 2016-08-10 IMAGING — DX DG FOOT COMPLETE 3+V*R*
3 series · 3 of 3 positions shown · non-contrast
Comparison: None.

CLINICAL DATA: Right foot pain, no known injury, initial encounter

EXAM:
RIGHT FOOT COMPLETE - 3+ VIEW

[foot ap]
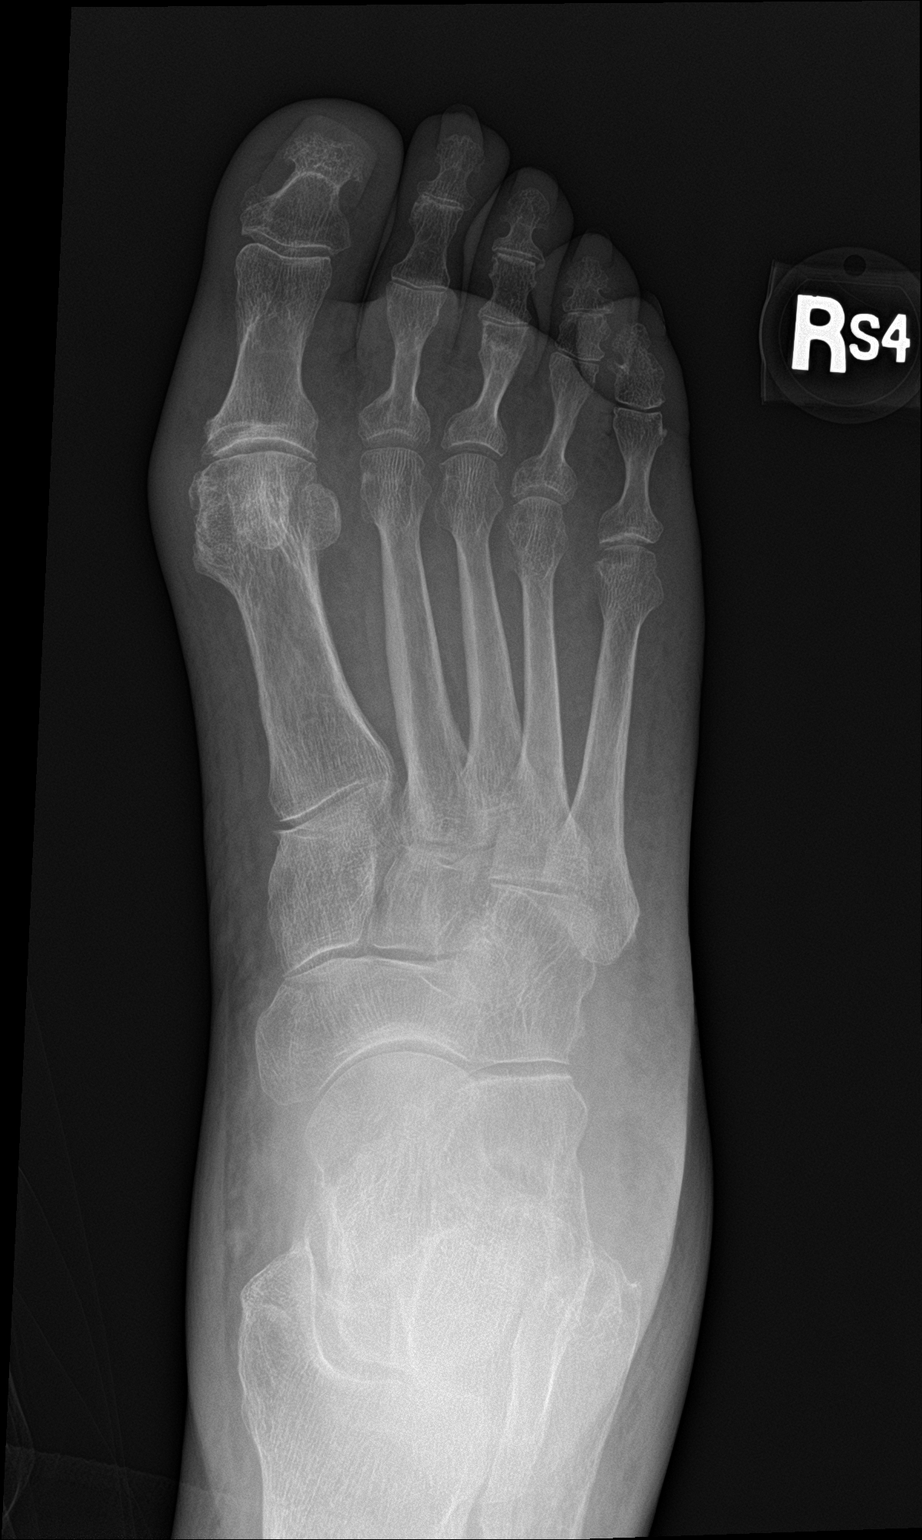

[foot obl]
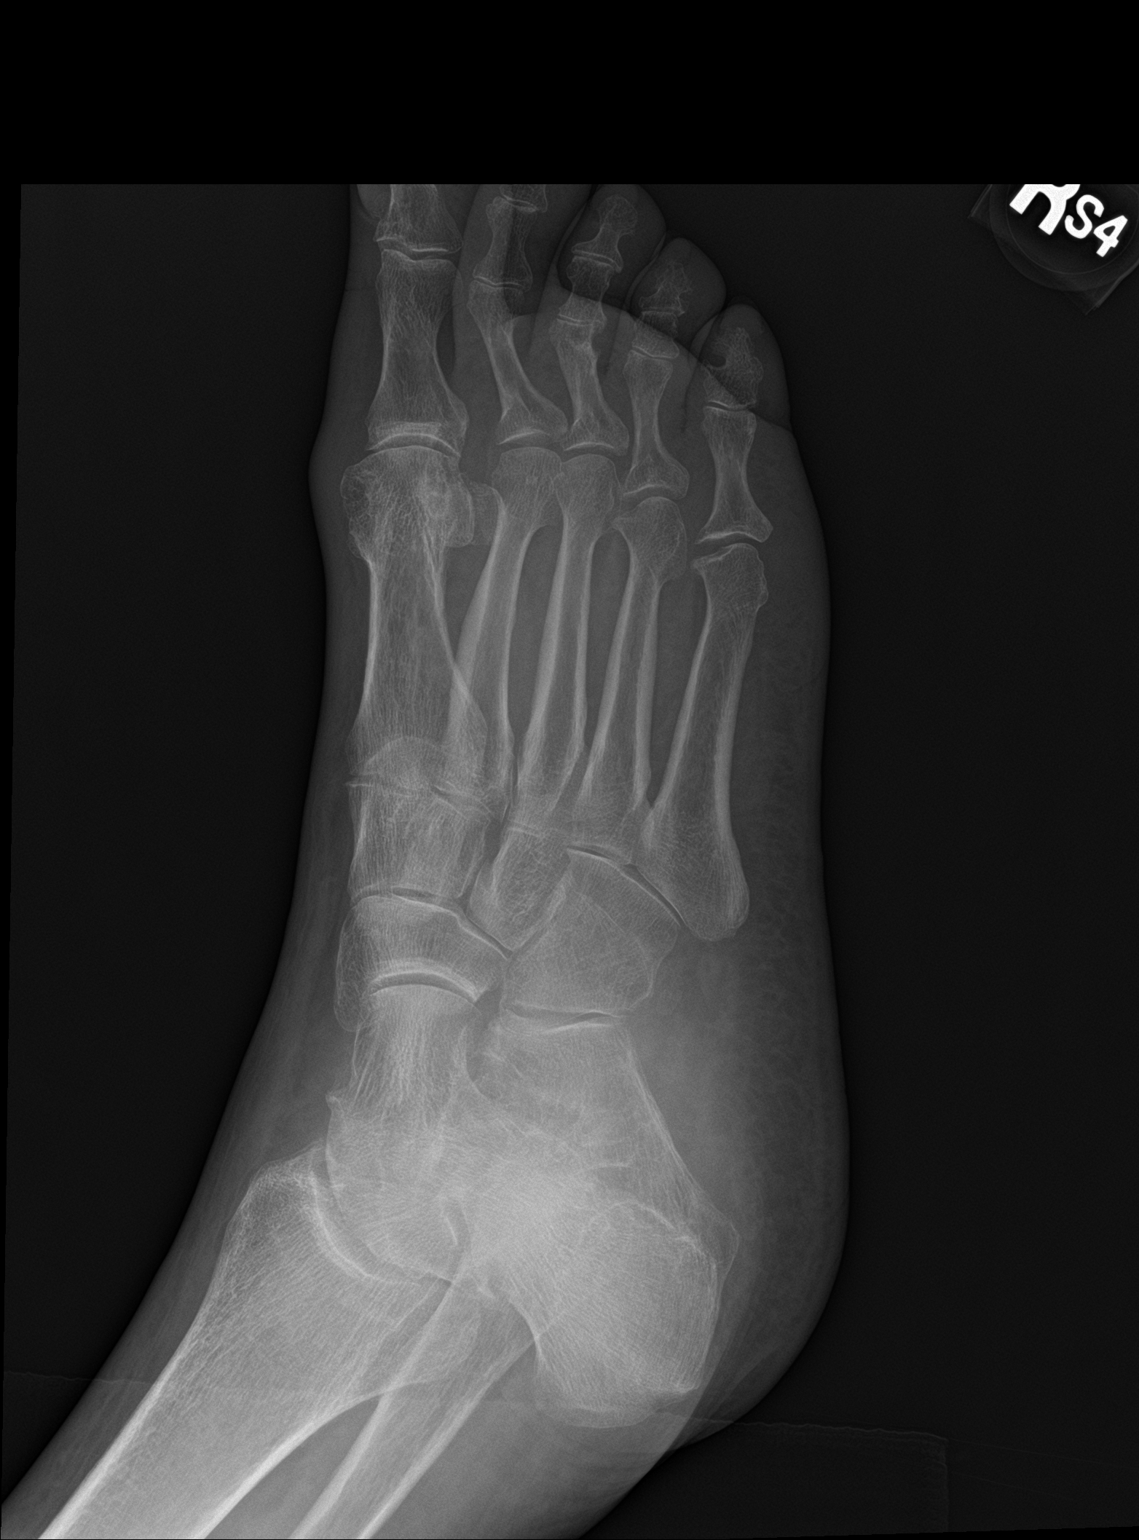

[foot lat]
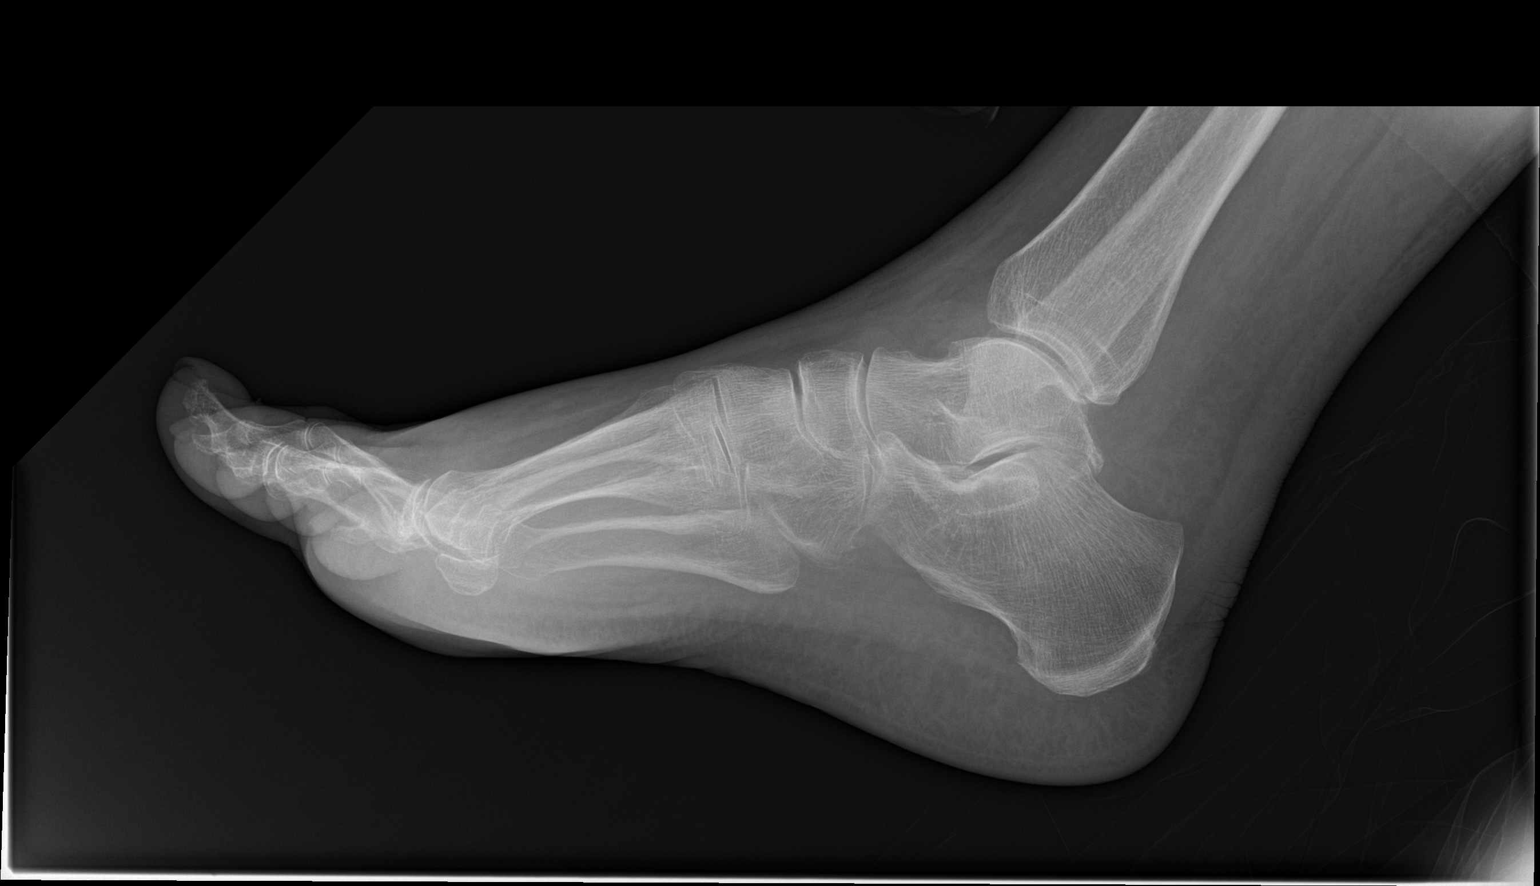

[3 of 3 positions shown; findings below may reference images not displayed]

FINDINGS: Mild hallux valgus deformity is noted. No acute fracture or
dislocation is noted. No soft tissue abnormality is noted. No acute
abnormality noted.
IMPRESSION: No acute abnormality noted.

## 2016-08-10 IMAGING — DX DG ANKLE COMPLETE 3+V*R*
3 series · 3 of 3 positions shown · non-contrast
Comparison: None.

CLINICAL DATA: Ankle pain and swelling without injury, initial
encounter

EXAM:
RIGHT ANKLE - COMPLETE 3+ VIEW

[ankle ap]
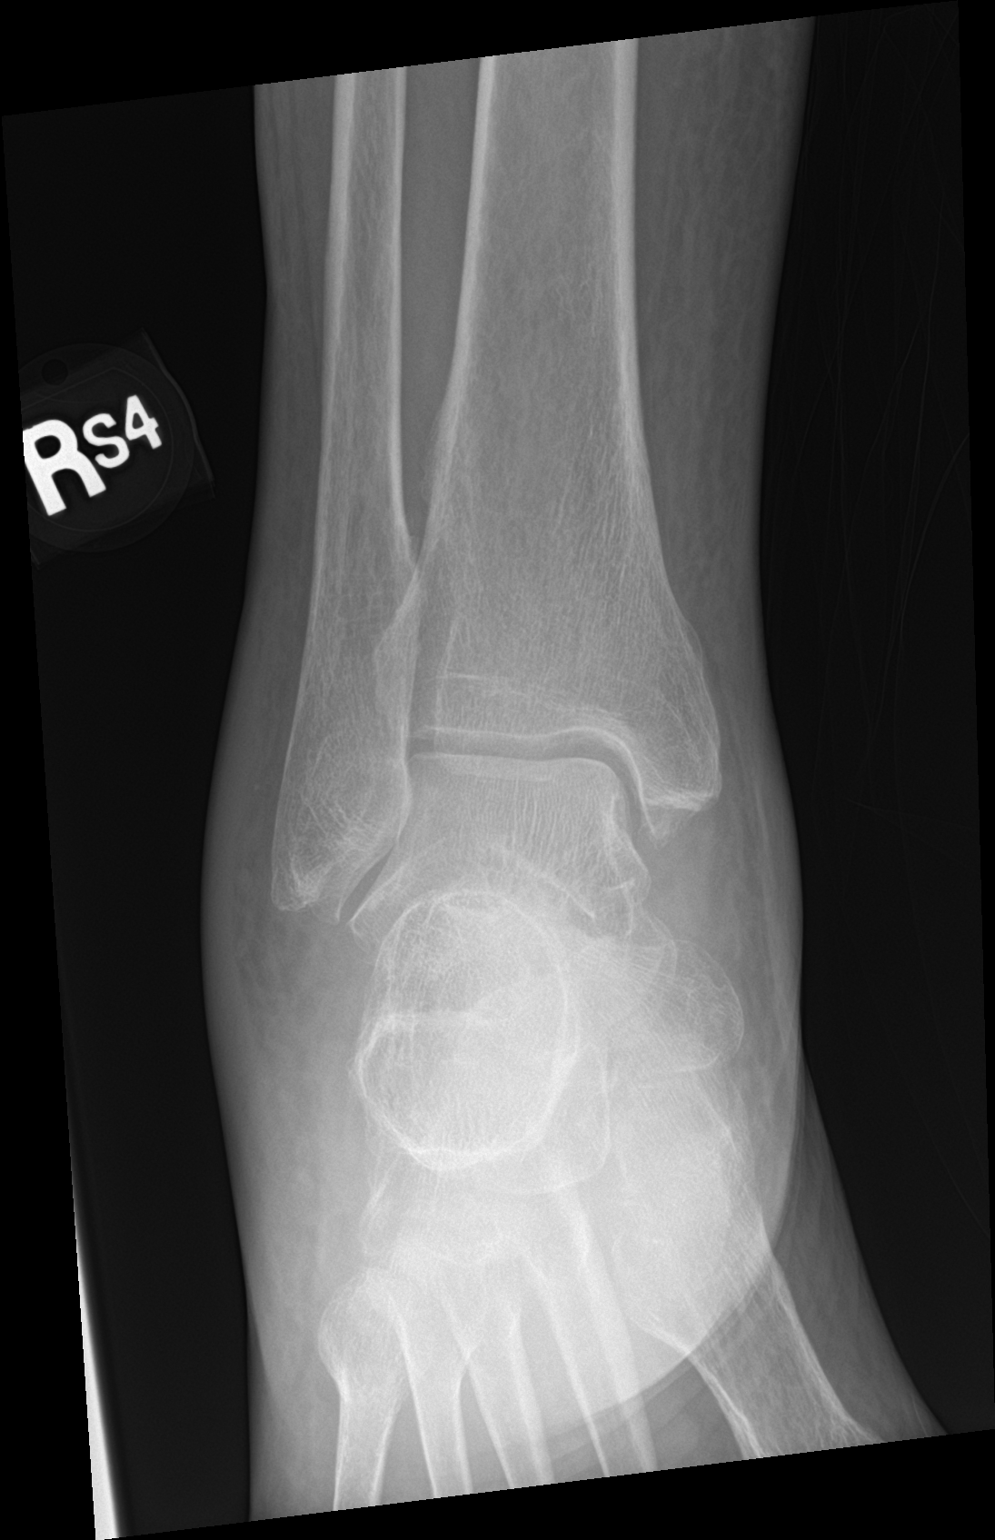

[ankle obl]
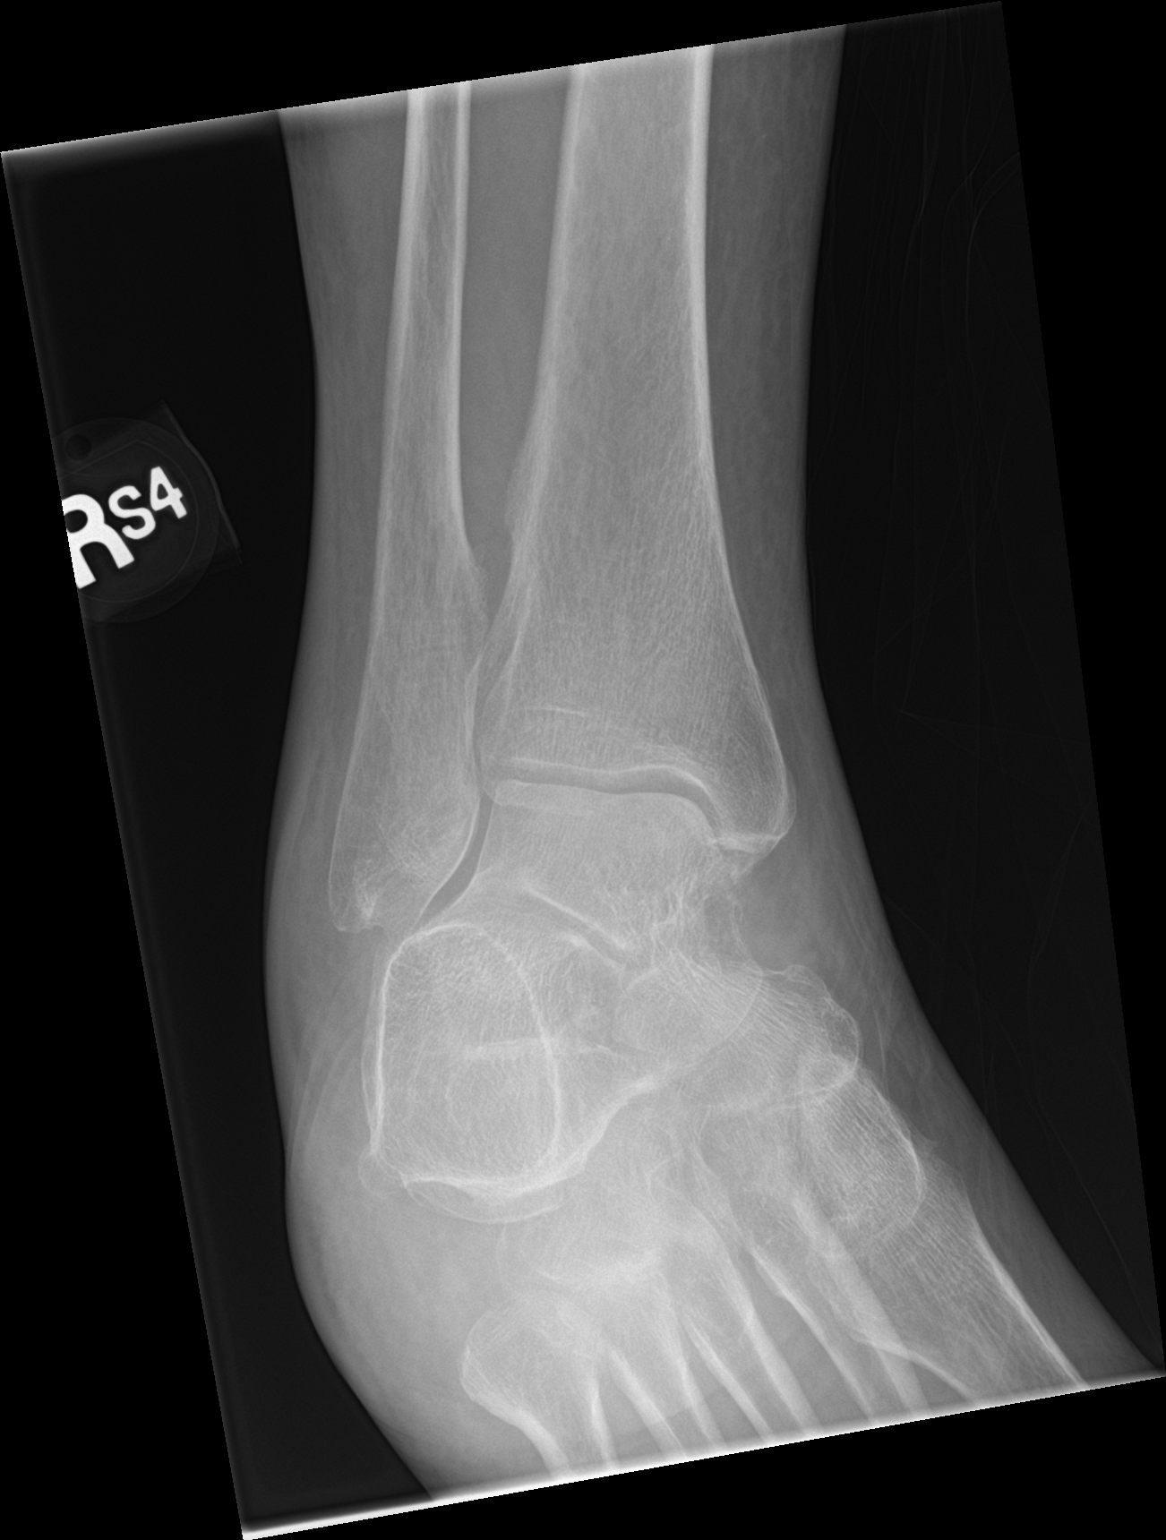

[ankle lat]
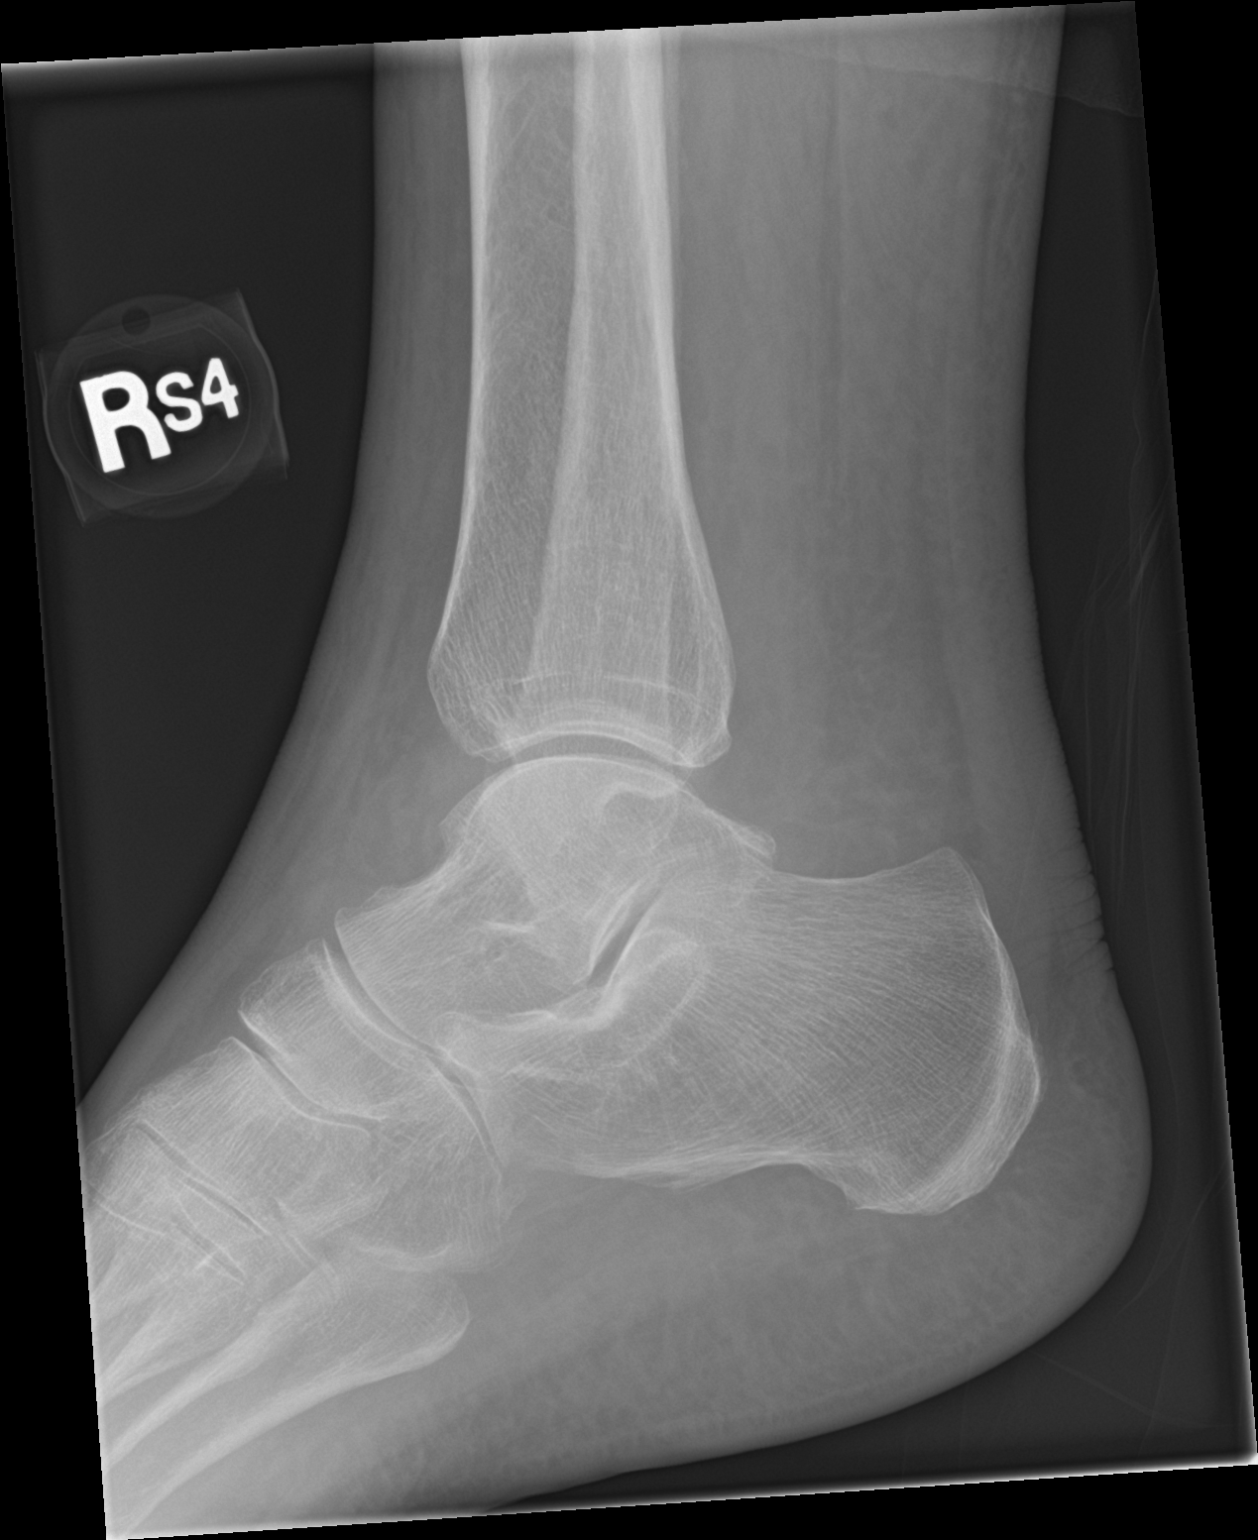

[3 of 3 positions shown; findings below may reference images not displayed]

FINDINGS: No acute fracture or dislocation is noted. Diffuse soft tissue
swelling is noted. Mild degenerative change of the talocalcaneal
joint is seen.
IMPRESSION: Soft tissue swelling without acute abnormality.

## 2016-08-10 MED ORDER — TRAMADOL HCL 50 MG PO TABS
100.0000 mg | ORAL_TABLET | Freq: Once | ORAL | Status: AC
  Administered 2016-08-10: 100 mg via ORAL
  Filled 2016-08-10: qty 2

## 2016-08-10 MED ORDER — WARFARIN SODIUM 2.5 MG PO TABS
2.5000 mg | ORAL_TABLET | Freq: Once | ORAL | Status: AC
  Administered 2016-08-10: 2.5 mg via ORAL
  Filled 2016-08-10: qty 1

## 2016-08-10 MED ORDER — TRAMADOL HCL 50 MG PO TABS
50.0000 mg | ORAL_TABLET | Freq: Once | ORAL | Status: AC
  Administered 2016-08-10: 50 mg via ORAL
  Filled 2016-08-10: qty 1

## 2016-08-10 MED ORDER — MORPHINE SULFATE (PF) 2 MG/ML IV SOLN
2.0000 mg | INTRAVENOUS | Status: DC | PRN
  Administered 2016-08-10 – 2016-08-13 (×6): 2 mg via INTRAVENOUS
  Filled 2016-08-10 (×6): qty 1

## 2016-08-10 MED ORDER — PNEUMOCOCCAL VAC POLYVALENT 25 MCG/0.5ML IJ INJ
0.5000 mL | INJECTION | INTRAMUSCULAR | Status: AC
  Administered 2016-08-11: 0.5 mL via INTRAMUSCULAR
  Filled 2016-08-10: qty 0.5

## 2016-08-10 NOTE — Progress Notes (Signed)
Pt is alert and oriented with right Leg and ankle pain, Md Order xray and morphine every 4 hours. Right now pain level is down from 9 to a 6 with 2 ultram tabs. lan to continue Iv Diuretic and pain control.

## 2016-08-10 NOTE — Progress Notes (Signed)
Patient requested pain med. Upon entry in room, patient sleeping. Text placed to Md for analgesic.

## 2016-08-10 NOTE — Progress Notes (Signed)
ANTICOAGULATION CONSULT NOTE - Initial Consult  Pharmacy Consult for Warfarin Indication: atrial fibrillation w/ MVR  Allergies  Allergen Reactions  . Ace Inhibitors Cough  . Codeine Other (See Comments)    Nausea and Feels Jittery   Patient Measurements: Height: 5\' 8"  (172.7 cm) Weight: 172 lb 6.4 oz (78.2 kg) (Scale B) IBW/kg (Calculated) : 63.9  Vital Signs: Temp: 98.3 F (36.8 C) (03/22 0700) Temp Source: Oral (03/22 0700) BP: 110/64 (03/22 0700) Pulse Rate: 100 (03/22 0700)  Labs:  Recent Labs  08/09/16 1004 08/10/16 0413  HGB 12.2  --   HCT 36.1  --   PLT 232  --   LABPROT 27.2*  --   INR 2.47  --   CREATININE 2.24* 2.10*  TROPONINI <0.03  --    Estimated Creatinine Clearance: 27.4 mL/min (A) (by C-G formula based on SCr of 2.1 mg/dL (H)).  Medical History: Past Medical History:  Diagnosis Date  . Acute kidney injury ([place])   . Atrial fibrillation ([place])   . Cardiomyopathy ([place])   . CHF (congestive heart failure) ([place])   . Coronary artery disease    4v CABG 6/17  . Hypertension   . 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 Thyroid disease    Medications:  Prescriptions Prior to Admission  Medication Sig Dispense Refill Last Dose  . amiodarone (PACERONE) 200 MG tablet Take 200 mg by mouth daily.   08/08/2016 at Unknown time  . aspirin 81 MG chewable tablet Chew 81 mg by mouth daily.   08/08/2016 at Unknown time  . hydrALAZINE (APRESOLINE) 10 MG tablet Take 10 mg by mouth 3 (three) times daily.   08/08/2016 at Unknown time  . levothyroxine (SYNTHROID, LEVOTHROID) 25 MCG tablet Take 25 mcg by mouth daily before breakfast.   08/08/2016 at Unknown time  . metoprolol succinate (TOPROL-XL) 50 MG 24 hr tablet Take 75 mg by mouth daily. Take with or immediately following a meal.   08/08/2016 at 0700  . potassium chloride (K-DUR,KLOR-CON) 10 MEQ tablet Take 10 mEq by mouth daily.   08/08/2016 at Unknown time  . pravastatin  (PRAVACHOL) 20 MG tablet Take 20 mg by mouth daily.   08/08/2016 at Unknown time  . spironolactone (ALDACTONE) 25 MG tablet Take 25 mg by mouth daily.   08/08/2016 at Unknown time  . torsemide (DEMADEX) 20 MG tablet Take 40 mg by mouth daily.   08/08/2016 at Unknown time  . warfarin (COUMADIN) 5 MG tablet Take 5 mg by mouth See admin instructions. 2.5mg  Tues Thurs, and 5mg  all other days   08/08/2016 at 1800  . predniSONE (DELTASONE) 20 MG tablet Take 2 tablets (40 mg total) by mouth daily with breakfast. For the next four days (Patient not taking: Reported on 08/09/2016) 8 tablet 0 Completed Course at Unknown time   Assessment: Patient is a 71 year old female admitted 3/21 w/ heart failure exacerbation. She is on chronic warfarin therapy PTA for a fib and MVR. Patient had recent elevated INR on 3/14 of 8.0, which she stated was due to drug interaction with prednisone. She was instructed to hold her warfarin for a few days, and she restarted her previous this past weekend (Sat or Sun). She is no longer taking the prednisone.   INR last night was 2.47 and warfarin 6mg  given. Will resume home regimen following the once boosted dose. Baseline CBC shows hemoglobin and platelet count within normal limits. No bleeding noted.   PTA  dose: 2.5mg  Tues, Thurs, and Sat and 5mg  all other days per patient and [facility] records  Goal of Therapy:  INR 2.5-3.5 Monitor platelets by anticoagulation protocol: Yes   Plan:  Warfarin 2.5mg  X1 tonight Daily INR/CBC Monitor signs/symptoms of bleeding   Georga Bora, PharmD Clinical Pharmacist Pager: [phone] 08/10/2016 9:49 AM  08/10/2016

## 2016-08-10 NOTE — Progress Notes (Addendum)
Progress Note  Patient Name: Jacqueline Palmer Date of Encounter: 08/10/2016  Primary Cardiologist: [place] with foot pain.  Ultram helps a little  Inpatient Medications    Scheduled Meds: . amiodarone  200 mg Oral Daily  . aspirin EC  81 mg Oral Daily  . furosemide  40 mg Intravenous Daily  . hydrALAZINE  10 mg Oral TID  . levothyroxine  25 mcg Oral QAC breakfast  . metoprolol succinate  75 mg Oral Daily  . [START ON 08/11/2016] pneumococcal 23 valent vaccine  0.5 mL Intramuscular Tomorrow-1000  . pravastatin  20 mg Oral Daily  . sodium chloride flush  3 mL Intravenous Q12H  . warfarin  2.5 mg Oral ONCE-1800  . Warfarin - Pharmacist Dosing Inpatient   Does not apply q1800   Continuous Infusions:  PRN Meds: sodium chloride, morphine injection, ondansetron (ZOFRAN) IV, sodium chloride flush   Vital Signs    Vitals:   08/09/16 1915 08/09/16 2032 08/10/16 0053 08/10/16 0700  BP: 101/72 110/87 (!) 112/95 110/64  Pulse: (!) 106 (!) 115 (!) 121 100  Resp: 14 16 17 16   Temp:  98.4 F (36.9 C) 98.4 F (36.9 C) 98.3 F (36.8 C)  TempSrc:  Oral Oral Oral  SpO2: 95% 100% 100% 99%  Weight:  173 lb 6.4 oz (78.7 kg)  172 lb 6.4 oz (78.2 kg)  Height:  5\' 8"  (1.727 m)      Intake/Output Summary (Last 24 hours) at 08/10/16 1029 Last data filed at 08/10/16 0928  Gross per 24 hour  Intake              480 ml  Output              400 ml  Net               80 ml   Filed Weights   08/09/16 0917 08/09/16 2032 08/10/16 0700  Weight: 170 lb (77.1 kg) 173 lb 6.4 oz (78.7 kg) 172 lb 6.4 oz (78.2 kg)    Telemetry     AFib/aflutter- Personally Reviewed  ECG    Aflutter with RVR. - Personally Reviewed  Physical Exam   GEN: No acute distress.   Neck: No JVD Cardiac: RRR, no murmurs, rubs, or gallops.  Respiratory: Clear to auscultation bilaterally. GI: Soft, nontender, non-distended  MS: No edema; No deformity. Neuro:  Nonfocal  Psych: Normal  affect   Labs    Chemistry Recent Labs Lab 08/09/16 1004 08/10/16 0413  NA 135 133*  K 4.3 4.3  CL 100* 98*  CO2 22 22  GLUCOSE 101* 92  BUN 20 20  CREATININE 2.24* 2.10*  CALCIUM 8.9 8.8*  GFRNONAA 21* 23*  GFRAA 24* 26*  ANIONGAP 13 13     Hematology Recent Labs Lab 08/09/16 1004  WBC 7.0  RBC 4.88  HGB 12.2  HCT 36.1  MCV 74.0*  MCH 25.0*  MCHC 33.8  RDW 16.9*  PLT 232    Cardiac Enzymes Recent Labs Lab 08/09/16 1004  TROPONINI <0.03   No results for input(s): TROPIPOC in the last 168 hours.   BNP Recent Labs Lab 08/09/16 1004  BNP 1,827.3*     DDimer No results for input(s): DDIMER in the last 168 hours.   Radiology    Dg Chest Port 1 View  Result Date: 08/09/2016 CLINICAL DATA:  Hypertension, shortness of Breath EXAM: PORTABLE CHEST 1 VIEW COMPARISON:  None. FINDINGS: Cardiomegaly. Prior  CABG and aortic valve replacement. No confluent opacities or effusions. No acute bony abnormality. IMPRESSION: Cardiomegaly.  No active disease. Electronically Signed   By: Rolm Baptise M.D.   On: 08/09/2016 10:09    Cardiac Studies   Records from [facility] reviewed  Patient Profile     71 y.o. female with complex cardiac disease, systolic heart failure.  Biggest issue is swelling and right foot and ankle pain.    Assessment & Plan    1) Morphine for pain.  TOlerated yesterday in the ER.  Check xrays of foot and ankle.  COntinue diuretic to help with swelling.  May be related to gout.  SHe received steroids a few weeks ago.  INR went up.  Steroids were for back pain.  WOuld have to be careful to avoid excess fluid retention and change in INR.  2) Acute on chronic systolic heart failure:  SHOB is improved.  LE edema is likely related to some heart failure.  If no fracture, would plan on treating for gout.  3) Atrial arrythmia with prior LAA thrombus.  INR betweeen 2-3.    4) CAD: no angina.  5) CRI- monitor BMet.  Signed, Larae Grooms, MD    08/10/2016, 10:29 AM

## 2016-08-11 DIAGNOSIS — M7989 Other specified soft tissue disorders: Secondary | ICD-10-CM

## 2016-08-11 DIAGNOSIS — M109 Gout, unspecified: Secondary | ICD-10-CM

## 2016-08-11 LAB — BASIC METABOLIC PANEL
Anion gap: 11 (ref 5–15)
BUN: 20 mg/dL (ref 6–20)
CO2: 23 mmol/L (ref 22–32)
Calcium: 8.9 mg/dL (ref 8.9–10.3)
Chloride: 99 mmol/L — ABNORMAL LOW (ref 101–111)
Creatinine, Ser: 2.07 mg/dL — ABNORMAL HIGH (ref 0.44–1.00)
GFR calc Af Amer: 27 mL/min — ABNORMAL LOW (ref >60)
GFR calc non Af Amer: 23 mL/min — ABNORMAL LOW (ref >60)
Glucose, Bld: 107 mg/dL — ABNORMAL HIGH (ref 65–99)
Potassium: 4.2 mmol/L (ref 3.5–5.1)
Sodium: 133 mmol/L — ABNORMAL LOW (ref 135–145)

## 2016-08-11 LAB — CBC
HCT: 35.6 % — ABNORMAL LOW (ref 36.0–46.0)
Hemoglobin: 11.9 g/dL — ABNORMAL LOW (ref 12.0–15.0)
MCH: 24.9 pg — ABNORMAL LOW (ref 26.0–34.0)
MCHC: 33.4 g/dL (ref 30.0–36.0)
MCV: 74.5 fL — ABNORMAL LOW (ref 78.0–100.0)
Platelets: 232 10*3/uL (ref 150–400)
RBC: 4.78 MIL/uL (ref 3.87–5.11)
RDW: 17 % — ABNORMAL HIGH (ref 11.5–15.5)
WBC: 5.9 10*3/uL (ref 4.0–10.5)

## 2016-08-11 LAB — URIC ACID: Uric Acid, Serum: 13.3 mg/dL — ABNORMAL HIGH (ref 2.3–6.6)

## 2016-08-11 LAB — PROTIME-INR
INR: 3.23
Prothrombin Time: 33.7 seconds — ABNORMAL HIGH (ref 11.4–15.2)

## 2016-08-11 LAB — SEDIMENTATION RATE: Sed Rate: 16 mm/hr (ref 0–22)

## 2016-08-11 MED ORDER — METHYLPREDNISOLONE SODIUM SUCC 125 MG IJ SOLR
60.0000 mg | Freq: Once | INTRAMUSCULAR | Status: AC
  Administered 2016-08-11: 60 mg via INTRAVENOUS
  Filled 2016-08-11: qty 2

## 2016-08-11 MED ORDER — WARFARIN SODIUM 5 MG PO TABS
5.0000 mg | ORAL_TABLET | Freq: Once | ORAL | Status: AC
  Administered 2016-08-11: 5 mg via ORAL
  Filled 2016-08-11: qty 1

## 2016-08-11 MED ORDER — FUROSEMIDE 10 MG/ML IJ SOLN
40.0000 mg | Freq: Two times a day (BID) | INTRAMUSCULAR | Status: DC
  Administered 2016-08-11 – 2016-08-12 (×2): 40 mg via INTRAVENOUS
  Filled 2016-08-11 (×2): qty 4

## 2016-08-11 NOTE — Progress Notes (Addendum)
Progress Note  Patient Name: Jacqueline Palmer Date of Encounter: 08/11/2016  Primary Cardiologist: [place] with foot pain.  Better after morphine  Inpatient Medications    Scheduled Meds: . amiodarone  200 mg Oral Daily  . aspirin EC  81 mg Oral Daily  . furosemide  40 mg Intravenous BID  . hydrALAZINE  10 mg Oral TID  . levothyroxine  25 mcg Oral QAC breakfast  . metoprolol succinate  75 mg Oral Daily  . pravastatin  20 mg Oral Daily  . sodium chloride flush  3 mL Intravenous Q12H  . warfarin  5 mg Oral ONCE-1800  . Warfarin - Pharmacist Dosing Inpatient   Does not apply q1800   Continuous Infusions:  PRN Meds: sodium chloride, morphine injection, ondansetron (ZOFRAN) IV, sodium chloride flush   Vital Signs    Vitals:   08/10/16 2100 08/11/16 0104 08/11/16 0418 08/11/16 0900  BP: 105/79 115/84 130/79 (!) 90/57  Pulse: 95 90 (!) 108 100  Resp: 18 17 18 18   Temp: 97.8 F (36.6 C) 97.4 F (36.3 C) 97.5 F (36.4 C) 98 F (36.7 C)  TempSrc: Oral Oral Oral Oral  SpO2: 99% 100% 100% 100%  Weight:   170 lb 12.8 oz (77.5 kg)   Height:        Intake/Output Summary (Last 24 hours) at 08/11/16 1050 Last data filed at 08/11/16 0902  Gross per 24 hour  Intake              820 ml  Output             1100 ml  Net             -280 ml   Filed Weights   08/09/16 2032 08/10/16 0700 08/11/16 0418  Weight: 173 lb 6.4 oz (78.7 kg) 172 lb 6.4 oz (78.2 kg) 170 lb 12.8 oz (77.5 kg)    Telemetry     AFib/aflutter- Personally Reviewed  ECG    Aflutter with RVR. - Personally Reviewed  Physical Exam   GEN: No acute distress.   Neck: Significant JVD, +HJR Cardiac: RRR, no murmurs, rubs, or gallops.  Respiratory: Clear to auscultation bilaterally. GI: Soft, nontender, non-distended  MS: Bilateral pitting edema; No deformity. Right ankle is warm. Neuro:  Nonfocal  Psych: Normal affect   Labs    Chemistry  Recent Labs Lab 08/09/16 1004  08/10/16 0413 08/11/16 0240  NA 135 133* 133*  K 4.3 4.3 4.2  CL 100* 98* 99*  CO2 22 22 23   GLUCOSE 101* 92 107*  BUN 20 20 20   CREATININE 2.24* 2.10* 2.07*  CALCIUM 8.9 8.8* 8.9  GFRNONAA 21* 23* 23*  GFRAA 24* 26* 27*  ANIONGAP 13 13 11      Hematology  Recent Labs Lab 08/09/16 1004 08/11/16 0240  WBC 7.0 5.9  RBC 4.88 4.78  HGB 12.2 11.9*  HCT 36.1 35.6*  MCV 74.0* 74.5*  MCH 25.0* 24.9*  MCHC 33.8 33.4  RDW 16.9* 17.0*  PLT 232 232    Cardiac Enzymes  Recent Labs Lab 08/09/16 1004  TROPONINI <0.03   No results for input(s): TROPIPOC in the last 168 hours.   BNP  Recent Labs Lab 08/09/16 1004  BNP 1,827.3*     DDimer No results for input(s): DDIMER in the last 168 hours.   Radiology    Dg Ankle Complete Right  Result Date: 08/10/2016 CLINICAL DATA:  Ankle pain and swelling without  injury, initial encounter EXAM: RIGHT ANKLE - COMPLETE 3+ VIEW COMPARISON:  None. FINDINGS: No acute fracture or dislocation is noted. Diffuse soft tissue swelling is noted. Mild degenerative change of the talocalcaneal joint is seen. IMPRESSION: Soft tissue swelling without acute abnormality. Electronically Signed   By: Inez Catalina M.D.   On: 08/10/2016 11:57   Dg Foot Complete Right  Result Date: 08/10/2016 CLINICAL DATA:  Right foot pain, no known injury, initial encounter EXAM: RIGHT FOOT COMPLETE - 3+ VIEW COMPARISON:  None. FINDINGS: Mild hallux valgus deformity is noted. No acute fracture or dislocation is noted. No soft tissue abnormality is noted. No acute abnormality noted. IMPRESSION: No acute abnormality noted. Electronically Signed   By: Inez Catalina M.D.   On: 08/10/2016 11:55    Cardiac Studies   Records from [facility] reviewed  Patient Profile     71 y.o. female with complex cardiac disease, systolic heart failure.  Biggest issue is swelling and right foot and ankle pain.    Assessment & Plan    1) Morphine for pain.  Tolerated well.  Feels that she can  try to walk more.  Check xrays of foot and ankle were negative for fracture.  COntinue diuretic to help with swelling.  May be related to gout. Will have to give a dose of solumedrol 60 mg x 1 today.  If there is improvement, she could be sent home on a Medrol dose pack.  Hesitant to use colchicine due to renal insufficiency.   Would have to have pharmacy adjust dose.  Will see if PT can ambulte.   SHe received steroids a few weeks ago.  INR went up.  Steroids were for back pain.  WOuld have to be careful to avoid excess fluid retention and change in INR.  She checks INR at home with amchine and calls [facility].  If sent home on Steroids, would have to have closer INR checks.  2) Acute on chronic systolic heart failure:  SHOB is improved.  LE edema is likely related to some heart failure.  If no fracture, would plan on treating for gout.  3) Atrial arrythmia with prior LAA thrombus.  INR betweeen 2-3.  Lower BP limits HR control meds that can be used.  4) CAD: no angina.  5) CRI- monitor BMet. Improving.  Signed, Larae Grooms, MD  08/11/2016, 10:50 AM

## 2016-08-11 NOTE — Progress Notes (Signed)
ANTICOAGULATION CONSULT NOTE - Initial Consult  Pharmacy Consult for Warfarin Indication: atrial fibrillation w/ MVR  Allergies  Allergen Reactions  . Ace Inhibitors Cough  . Codeine Other (See Comments)    Nausea and Feels Jittery   Patient Measurements: Height: 5\' 8"  (172.7 cm) Weight: 170 lb 12.8 oz (77.5 kg) IBW/kg (Calculated) : 63.9  Vital Signs: Temp: 98 F (36.7 C) (03/23 0900) Temp Source: Oral (03/23 0900) BP: 90/57 (03/23 0900) Pulse Rate: 100 (03/23 0900)  Labs:  Recent Labs  08/09/16 1004 08/10/16 0413 08/11/16 0240  HGB 12.2  --  11.9*  HCT 36.1  --  35.6*  PLT 232  --  232  LABPROT 27.2*  --  33.7*  INR 2.47  --  3.23  CREATININE 2.24* 2.10* 2.07*  TROPONINI <0.03  --   --    Estimated Creatinine Clearance: 27.7 mL/min (A) (by C-G formula based on SCr of 2.07 mg/dL (H)).  Medical History: Past Medical History:  Diagnosis Date  . Acute kidney injury ([place])   . Atrial fibrillation ([place])   . Cardiomyopathy ([place])   . CHF (congestive heart failure) ([place])   . Coronary artery disease    4v CABG 6/17  . Hypertension   . 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 Thyroid disease    Medications:  Prescriptions Prior to Admission  Medication Sig Dispense Refill Last Dose  . amiodarone (PACERONE) 200 MG tablet Take 200 mg by mouth daily.   08/08/2016 at Unknown time  . aspirin 81 MG chewable tablet Chew 81 mg by mouth daily.   08/08/2016 at Unknown time  . hydrALAZINE (APRESOLINE) 10 MG tablet Take 10 mg by mouth 3 (three) times daily.   08/08/2016 at Unknown time  . levothyroxine (SYNTHROID, LEVOTHROID) 25 MCG tablet Take 25 mcg by mouth daily before breakfast.   08/08/2016 at Unknown time  . metoprolol succinate (TOPROL-XL) 50 MG 24 hr tablet Take 75 mg by mouth daily. Take with or immediately following a meal.   08/08/2016 at 0700  . potassium chloride (K-DUR,KLOR-CON) 10 MEQ tablet Take 10 mEq by mouth daily.    08/08/2016 at Unknown time  . pravastatin (PRAVACHOL) 20 MG tablet Take 20 mg by mouth daily.   08/08/2016 at Unknown time  . spironolactone (ALDACTONE) 25 MG tablet Take 25 mg by mouth daily.   08/08/2016 at Unknown time  . torsemide (DEMADEX) 20 MG tablet Take 40 mg by mouth daily.   08/08/2016 at Unknown time  . warfarin (COUMADIN) 5 MG tablet Take 5 mg by mouth See admin instructions. 2.5mg  Tues Thurs, and 5mg  all other days   08/08/2016 at 1800  . predniSONE (DELTASONE) 20 MG tablet Take 2 tablets (40 mg total) by mouth daily with breakfast. For the next four days (Patient not taking: Reported on 08/09/2016) 8 tablet 0 Completed Course at Unknown time   Assessment: Patient is a 71 year old female admitted 3/21 w/ heart failure exacerbation. She is on chronic warfarin therapy PTA for a fib and MVR. Patient had recent elevated INR on 3/14 of 8.0, which she stated was due to drug interaction with prednisone. She was instructed to hold her warfarin for a few days, and she restarted her previous this past weekend (Sat or Sun). She is no longer taking the prednisone.   INR therapeutc: 3.23 - CBC stable, no bleeding reported  PTA dose: 2.5mg  Tues, Thurs, and Sat and 5mg  all other days per patient and  [facility] records  Goal of Therapy:  INR 2.5-3.5 Monitor platelets by anticoagulation protocol: Yes   Plan:  Warfarin 5mg  X1 tonight Daily INR/CBC Monitor signs/symptoms of bleeding   Georga Bora, PharmD Clinical Pharmacist Pager: [phone] 08/11/2016 10:14 AM

## 2016-08-11 NOTE — Evaluation (Signed)
Physical Therapy Evaluation Patient Details Name: Jacqueline Palmer MRN: [id number] DOB: 09-16-1945 Today's Date: 08/11/2016   History of Present Illness  71 yo female with PMH of permanent afib s/p ablation and MAZE with left atrial appendage ligation, chronic systolic HF, HTN, s/p bioprosthetic MVR with 4v CABG who presented with increasing dyspnea and LE edema. Dx of acute on chronic CHF, R foot pain x 1 week (? Gout).    Clinical Impression  Pt admitted with above diagnosis. Pt currently with functional limitations due to the deficits listed below (see PT Problem List). Pt ambulated 15' with RW, she keeps her RLE nonweightbearing due to R foot pain. Distance limited by pain and nausea. HR 108 walking, SaO2 100% on RA. Pt will benefit from skilled PT to increase their independence and safety with mobility to allow discharge to the venue listed below.       Follow Up Recommendations Home health PT    Equipment Recommendations  Rolling walker with 5" wheels    Recommendations for Other Services       Precautions / Restrictions Precautions Precautions: Fall Precaution Comments: no falls in past 1 year Restrictions Weight Bearing Restrictions: No      Mobility  Bed Mobility Overal bed mobility: Modified Independent             General bed mobility comments: with bedrail  Transfers Overall transfer level: Needs assistance Equipment used: Rolling walker (2 wheeled) Transfers: Sit to/from Stand Sit to Stand: Min guard         General transfer comment: min/guard safety, pt maintains RLE in NWB position 2* pain; no LOB  Ambulation/Gait Ambulation/Gait assistance: Min guard Ambulation Distance (Feet): 15 Feet Assistive device: Rolling walker (2 wheeled) Gait Pattern/deviations: Step-to pattern   Gait velocity interpretation: Below normal speed for age/gender General Gait Details: pt keeps RLE in NWB position due to pain with WB, no LOB, distance limited by pain and  nausea, nausea resolved after 3 minutes seated rest. Vital signs stable. HR 108 walking, SaO2 100% RA walking.   Stairs            Wheelchair Mobility    Modified Rankin (Stroke Patients Only)       Balance Overall balance assessment: Modified Independent                                           Pertinent Vitals/Pain Pain Assessment: 0-10 Pain Score: 5  Pain Location: R foot with movement Pain Descriptors / Indicators: Sore Pain Intervention(s): Limited activity within patient's tolerance;Monitored during session;Patient requesting pain meds-RN notified    Home Living Family/patient expects to be discharged to:: Private residence Living Arrangements: Alone Available Help at Discharge: Available PRN/intermittently;Family   Home Access: Level entry     Home Layout: One level Home Equipment: Cane - quad      Prior Function Level of Independence: Independent with assistive device(s)         Comments: walked independently until 1 week ago when R foot pain started, since then has used quad cane     Hand Dominance        Extremity/Trunk Assessment   Upper Extremity Assessment Upper Extremity Assessment: Overall WFL for tasks assessed    Lower Extremity Assessment Lower Extremity Assessment: RLE deficits/detail RLE Deficits / Details: tolerates minimal active ankle PF/DF, limited by pain, R knee ext AROM WNL (didn't do  manual muscle test due to RLE pain) RLE: Unable to fully assess due to pain    Cervical / Trunk Assessment Cervical / Trunk Assessment: Normal  Communication   Communication: No difficulties  Cognition Arousal/Alertness: Awake/alert Behavior During Therapy: WFL for tasks assessed/performed Overall Cognitive Status: Within Functional Limits for tasks assessed                                        General Comments      Exercises     Assessment/Plan    PT Assessment Patient needs continued PT  services  PT Problem List Decreased activity tolerance;Pain;Decreased mobility       PT Treatment Interventions Gait training;DME instruction;Functional mobility training;Therapeutic exercise;Therapeutic activities;Patient/family education    PT Goals (Current goals can be found in the Care Plan section)  Acute Rehab PT Goals Patient Stated Goal: return to independence with mobility, likes to go out to dinner with friends and go to church PT Goal Formulation: With patient Time For Goal Achievement: 08/25/16 Potential to Achieve Goals: Good    Frequency Min 3X/week   Barriers to discharge        Co-evaluation               End of Session Equipment Utilized During Treatment: Gait belt   Patient left: in chair;with call bell/phone within reach Nurse Communication: Mobility status PT Visit Diagnosis: Pain;Other abnormalities of gait and mobility (R26.89);Difficulty in walking, not elsewhere classified (R26.2) Pain - Right/Left: Right Pain - part of body: Ankle and joints of foot    Time: 1142-1205 PT Time Calculation (min) (ACUTE ONLY): 23 min   Charges:   PT Evaluation $PT Eval Low Complexity: 1 Procedure PT Treatments $Gait Training: 8-22 mins   PT G Codes:          Philomena Doheny 08/11/2016, 12:33 PM [phone]

## 2016-08-11 NOTE — Progress Notes (Signed)
Pt is b/p low Md ordered to hold metoprolol and hydralazine and recheck within a hour.

## 2016-08-11 NOTE — Progress Notes (Signed)
Pt is alert and oriented with no distress today, Solumedrol 60 mg and lasix 40mg  was give for inflammation. Was able to sit in chair with therapy, and plans to walk further this evening as tolerated.

## 2016-08-11 NOTE — Care Management Note (Signed)
Case Management Note  Patient Details  Name: Jacqueline Palmer MRN: [id number] Date of Birth: 02-03-1946  Subjective/Objective:    Admitted with CHF               Action/Plan: Patient lives alone with family members close by; she does not have a PCP at this time and states that she has references and plan on choosing her PCP when she is discharged; has private insurance with Medicare / Jacqueline Palmer with prescription drug coverage; pharmacy of choice is [facility]; no DME, she usually exercises 3 times a week at a local gym; her sister in law does all of her cooking - heart healthy low sodium; noted Physical Therapy recommendations for HHPT; pt does not want any HHC at this time; CM will continue to follow for DCP  Expected Discharge Date:   possibly 08/12/2016               Expected Discharge Plan:  [place]  Discharge planning Services  CM Consult   HH Arranged:  Patient Refused [facility]     Status of Service:  In process, will continue to follow  Sherrilyn Rist [phone] 08/11/2016, 3:41 PM

## 2016-08-12 DIAGNOSIS — I5023 Acute on chronic systolic (congestive) heart failure: Secondary | ICD-10-CM

## 2016-08-12 LAB — BASIC METABOLIC PANEL
Anion gap: 13 (ref 5–15)
BUN: 26 mg/dL — ABNORMAL HIGH (ref 6–20)
CO2: 19 mmol/L — ABNORMAL LOW (ref 22–32)
Calcium: 9 mg/dL (ref 8.9–10.3)
Chloride: 101 mmol/L (ref 101–111)
Creatinine, Ser: 2.72 mg/dL — ABNORMAL HIGH (ref 0.44–1.00)
GFR calc Af Amer: 19 mL/min — ABNORMAL LOW (ref >60)
GFR calc non Af Amer: 17 mL/min — ABNORMAL LOW (ref >60)
Glucose, Bld: 135 mg/dL — ABNORMAL HIGH (ref 65–99)
Potassium: 5.4 mmol/L — ABNORMAL HIGH (ref 3.5–5.1)
Sodium: 133 mmol/L — ABNORMAL LOW (ref 135–145)

## 2016-08-12 LAB — CBC
HCT: 39.2 % (ref 36.0–46.0)
Hemoglobin: 13 g/dL (ref 12.0–15.0)
MCH: 24.8 pg — ABNORMAL LOW (ref 26.0–34.0)
MCHC: 33.2 g/dL (ref 30.0–36.0)
MCV: 74.8 fL — ABNORMAL LOW (ref 78.0–100.0)
Platelets: 237 10*3/uL (ref 150–400)
RBC: 5.24 MIL/uL — ABNORMAL HIGH (ref 3.87–5.11)
RDW: 17.1 % — ABNORMAL HIGH (ref 11.5–15.5)
WBC: 5.7 10*3/uL (ref 4.0–10.5)

## 2016-08-12 LAB — PROTIME-INR
INR: 3.6
Prothrombin Time: 36.8 seconds — ABNORMAL HIGH (ref 11.4–15.2)

## 2016-08-12 MED ORDER — WARFARIN - PHARMACIST DOSING INPATIENT: Freq: Every day | Status: DC

## 2016-08-12 MED ORDER — WARFARIN SODIUM 2.5 MG PO TABS
2.5000 mg | ORAL_TABLET | Freq: Once | ORAL | Status: AC
  Administered 2016-08-12: 2.5 mg via ORAL
  Filled 2016-08-12: qty 1

## 2016-08-12 MED ORDER — BISACODYL 5 MG PO TBEC: 5.0000 mg | DELAYED_RELEASE_TABLET | Freq: Every day | ORAL | Status: DC | PRN

## 2016-08-12 MED ORDER — DOCUSATE SODIUM 100 MG PO CAPS
100.0000 mg | ORAL_CAPSULE | Freq: Every day | ORAL | Status: DC | PRN
  Administered 2016-08-12: 100 mg via ORAL
  Filled 2016-08-12 (×2): qty 1

## 2016-08-12 NOTE — Plan of Care (Signed)
Problem: Activity: Goal: Risk for activity intolerance will decrease Outcome: Progressing Pt able to ambulate down to the end of the hall and back with no issues and significantly less pain than before.

## 2016-08-12 NOTE — Progress Notes (Signed)
Progress Note  Patient Name: Jacqueline Palmer Date of Encounter: 08/12/2016  Primary Cardiologist:   [facility]  Subjective   Her foot pain is improved.  No acute SOB   Inpatient Medications    Scheduled Meds: . amiodarone  200 mg Oral Daily  . aspirin EC  81 mg Oral Daily  . furosemide  40 mg Intravenous BID  . hydrALAZINE  10 mg Oral TID  . levothyroxine  25 mcg Oral QAC breakfast  . metoprolol succinate  75 mg Oral Daily  . pravastatin  20 mg Oral Daily  . sodium chloride flush  3 mL Intravenous Q12H  . Warfarin - Pharmacist Dosing Inpatient   Does not apply q1800   Continuous Infusions:  PRN Meds: sodium chloride, morphine injection, ondansetron (ZOFRAN) IV, sodium chloride flush   Vital Signs    Vitals:   08/11/16 1110 08/11/16 1921 08/12/16 0546 08/12/16 0900  BP: 109/80 100/76 109/78 103/72  Pulse: (!) 105 (!) 102 91 91  Resp:  18 18 16   Temp:  97.6 F (36.4 C) 98.2 F (36.8 C) 97.5 F (36.4 C)  TempSrc:  Oral Oral Oral  SpO2:  100% 100% 99%  Weight:   173 lb 1.6 oz (78.5 kg)   Height:        Intake/Output Summary (Last 24 hours) at 08/12/16 0954 Last data filed at 08/12/16 0900  Gross per 24 hour  Intake              700 ml  Output              400 ml  Net              300 ml   Filed Weights   08/10/16 0700 08/11/16 0418 08/12/16 0546  Weight: 172 lb 6.4 oz (78.2 kg) 170 lb 12.8 oz (77.5 kg) 173 lb 1.6 oz (78.5 kg)    Telemetry    Atrial fib with rate OK.   - Personally Reviewed  ECG    NA - Personally Reviewed  Physical Exam   GEN: No acute distress.   Neck: No  JVD Cardiac: Irregular RR, NO murmurs, rubs, or gallops.  Respiratory: Clear  to auscultation bilaterally. GI: Soft, nontender, non-distended  MS: Mild edema; No deformity. Neuro:  Nonfocal  Psych: Normal affect   Labs    Chemistry Recent Labs Lab 08/10/16 0413 08/11/16 0240 08/12/16 0040  NA 133* 133* 133*  K 4.3 4.2 5.4*  CL 98* 99* 101  CO2 22 23 19*  GLUCOSE 92  107* 135*  BUN 20 20 26*  CREATININE 2.10* 2.07* 2.72*  CALCIUM 8.8* 8.9 9.0  GFRNONAA 23* 23* 17*  GFRAA 26* 27* 19*  ANIONGAP 13 11 13      Hematology Recent Labs Lab 08/09/16 1004 08/11/16 0240 08/12/16 0040  WBC 7.0 5.9 5.7  RBC 4.88 4.78 5.24*  HGB 12.2 11.9* 13.0  HCT 36.1 35.6* 39.2  MCV 74.0* 74.5* 74.8*  MCH 25.0* 24.9* 24.8*  MCHC 33.8 33.4 33.2  RDW 16.9* 17.0* 17.1*  PLT 232 232 237    Cardiac Enzymes Recent Labs Lab 08/09/16 1004  TROPONINI <0.03   No results for input(s): TROPIPOC in the last 168 hours.   BNP Recent Labs Lab 08/09/16 1004  BNP 1,827.3*     DDimer No results for input(s): DDIMER in the last 168 hours.   Radiology    Dg Ankle Complete Right  Result Date: 08/10/2016 CLINICAL DATA:  Ankle pain  and swelling without injury, initial encounter EXAM: RIGHT ANKLE - COMPLETE 3+ VIEW COMPARISON:  None. FINDINGS: No acute fracture or dislocation is noted. Diffuse soft tissue swelling is noted. Mild degenerative change of the talocalcaneal joint is seen. IMPRESSION: Soft tissue swelling without acute abnormality. Electronically Signed   By: Inez Catalina M.D.   On: 08/10/2016 11:57   Dg Foot Complete Right  Result Date: 08/10/2016 CLINICAL DATA:  Right foot pain, no known injury, initial encounter EXAM: RIGHT FOOT COMPLETE - 3+ VIEW COMPARISON:  None. FINDINGS: Mild hallux valgus deformity is noted. No acute fracture or dislocation is noted. No soft tissue abnormality is noted. No acute abnormality noted. IMPRESSION: No acute abnormality noted. Electronically Signed   By: Inez Catalina M.D.   On: 08/10/2016 11:55    Cardiac Studies   NA  Patient Profile     71 y.o. female with PMH of permanent afib s/p ablation and MAZE with left atrial appendage ligation, chronic systolic HF, HTN, s/p bioprosthetic MVR with 4v CABG who presented with increasing dyspnea and LE edema.   Assessment & Plan    ACUTE ON CHRONIC SYSTOLIC HF:   Not clear that her  intake and output is up to date.  Her weight is increased which doesn't make sense clinically.  I am going to hold her Lasix secondary to her increased creatinine.  She will keep her feet up.   CKD STAGE IV:    Creat is elevated today.   PERMANENT ATRIAL FIB:  On anticoagulation.  Rate OK  HTN:  BP is well controlled on current meds.    CAD: No evidence of ongoing angina.  No further ischemia work up.    FOOT PAIN:  Likely gout.  Negative work up otherwise.  Ambulated with PT.    Signed, Minus Breeding, MD  08/12/2016, 9:54 AM

## 2016-08-12 NOTE — Progress Notes (Addendum)
ANTICOAGULATION CONSULT NOTE - Initial Consult  Pharmacy Consult for Warfarin Indication: atrial fibrillation w/ MVR  Allergies  Allergen Reactions  . Ace Inhibitors Cough  . Codeine Other (See Comments)    Nausea and Feels Jittery   Patient Measurements: Height: 5\' 8"  (172.7 cm) Weight: 173 lb 1.6 oz (78.5 kg) IBW/kg (Calculated) : 63.9  Vital Signs: Temp: 97.5 F (36.4 C) (03/24 0900) Temp Source: Oral (03/24 0900) BP: 103/72 (03/24 0900) Pulse Rate: 91 (03/24 0900)  Labs:  Recent Labs  08/09/16 1004 08/10/16 0413 08/11/16 0240 08/12/16 0040  HGB 12.2  --  11.9* 13.0  HCT 36.1  --  35.6* 39.2  PLT 232  --  232 237  LABPROT 27.2*  --  33.7* 36.8*  INR 2.47  --  3.23 3.60  CREATININE 2.24* 2.10* 2.07* 2.72*  TROPONINI <0.03  --   --   --    Estimated Creatinine Clearance: 21.2 mL/min (A) (by C-G formula based on SCr of 2.72 mg/dL (H)).  Medical History: Past Medical History:  Diagnosis Date  . Acute kidney injury ([place])   . Atrial fibrillation ([place])   . Cardiomyopathy ([place])   . CHF (congestive heart failure) ([place])   . Coronary artery disease    4v CABG 6/17  . Hypertension   . 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 Thyroid disease    Medications:  Prescriptions Prior to Admission  Medication Sig Dispense Refill Last Dose  . amiodarone (PACERONE) 200 MG tablet Take 200 mg by mouth daily.   08/08/2016 at Unknown time  . aspirin 81 MG chewable tablet Chew 81 mg by mouth daily.   08/08/2016 at Unknown time  . hydrALAZINE (APRESOLINE) 10 MG tablet Take 10 mg by mouth 3 (three) times daily.   08/08/2016 at Unknown time  . levothyroxine (SYNTHROID, LEVOTHROID) 25 MCG tablet Take 25 mcg by mouth daily before breakfast.   08/08/2016 at Unknown time  . metoprolol succinate (TOPROL-XL) 50 MG 24 hr tablet Take 75 mg by mouth daily. Take with or immediately following a meal.   08/08/2016 at 0700  . potassium chloride (K-DUR,KLOR-CON)  10 MEQ tablet Take 10 mEq by mouth daily.   08/08/2016 at Unknown time  . pravastatin (PRAVACHOL) 20 MG tablet Take 20 mg by mouth daily.   08/08/2016 at Unknown time  . spironolactone (ALDACTONE) 25 MG tablet Take 25 mg by mouth daily.   08/08/2016 at Unknown time  . torsemide (DEMADEX) 20 MG tablet Take 40 mg by mouth daily.   08/08/2016 at Unknown time  . warfarin (COUMADIN) 5 MG tablet Take 5 mg by mouth See admin instructions. 2.5mg  Tues Thurs, and 5mg  all other days   08/08/2016 at 1800  . predniSONE (DELTASONE) 20 MG tablet Take 2 tablets (40 mg total) by mouth daily with breakfast. For the next four days (Patient not taking: Reported on 08/09/2016) 8 tablet 0 Completed Course at Unknown time   Assessment: Patient is a 71 year old female admitted 3/21 w/ heart failure exacerbation. She is on chronic warfarin therapy PTA for a fib and MVR. Patient had recent elevated INR on 3/14 of 8.0, which she stated was due to drug interaction with prednisone. She was instructed to hold her warfarin for a few days, and she restarted her previous dose this past weekend (Sat or Sun). She is no longer taking the prednisone.   INR slightly supratherapeutc: 3.60.  -INR goal 2.5-3.5. Although valve is bioprosthetic, I  confirmed with the patient that this is the correct goal.  -CBC stable, no bleeding reported  PTA dose: 2.5mg  Tues, Thurs, and Sat and 5mg  all other days per patient and [facility] records  Goal of Therapy:  INR 2.5-3.5 Monitor platelets by anticoagulation protocol: Yes   Plan:  Warfarin 2.5mg  x1 tonight Daily INR Monitor signs/symptoms of bleeding   Myer Peer Grayland Ormond), PharmD  PGY1 Pharmacy Resident Pager: [phone] 08/12/2016 10:02 AM

## 2016-08-13 LAB — CBC
HCT: 36.2 % (ref 36.0–46.0)
Hemoglobin: 11.9 g/dL — ABNORMAL LOW (ref 12.0–15.0)
MCH: 24.5 pg — ABNORMAL LOW (ref 26.0–34.0)
MCHC: 32.9 g/dL (ref 30.0–36.0)
MCV: 74.5 fL — ABNORMAL LOW (ref 78.0–100.0)
Platelets: 247 10*3/uL (ref 150–400)
RBC: 4.86 MIL/uL (ref 3.87–5.11)
RDW: 17.2 % — ABNORMAL HIGH (ref 11.5–15.5)
WBC: 7.3 10*3/uL (ref 4.0–10.5)

## 2016-08-13 LAB — PROTIME-INR
INR: 4.81
Prothrombin Time: 46.4 seconds — ABNORMAL HIGH (ref 11.4–15.2)

## 2016-08-13 LAB — BASIC METABOLIC PANEL
Anion gap: 10 (ref 5–15)
BUN: 31 mg/dL — ABNORMAL HIGH (ref 6–20)
CO2: 24 mmol/L (ref 22–32)
Calcium: 8.6 mg/dL — ABNORMAL LOW (ref 8.9–10.3)
Chloride: 99 mmol/L — ABNORMAL LOW (ref 101–111)
Creatinine, Ser: 2.33 mg/dL — ABNORMAL HIGH (ref 0.44–1.00)
GFR calc Af Amer: 23 mL/min — ABNORMAL LOW (ref >60)
GFR calc non Af Amer: 20 mL/min — ABNORMAL LOW (ref >60)
Glucose, Bld: 87 mg/dL (ref 65–99)
Potassium: 4.6 mmol/L (ref 3.5–5.1)
Sodium: 133 mmol/L — ABNORMAL LOW (ref 135–145)

## 2016-08-13 MED ORDER — TORSEMIDE 20 MG PO TABS
40.0000 mg | ORAL_TABLET | Freq: Every day | ORAL | Status: DC
  Administered 2016-08-13 – 2016-08-15 (×3): 40 mg via ORAL
  Filled 2016-08-13 (×3): qty 2

## 2016-08-13 NOTE — Progress Notes (Signed)
CRITICAL VALUE ALERT  Critical value received:  INR 4.81  Date of notification:  08/13/2016  Time of notification:  0625  Critical value read back: Yes  Nurse who received alert:  Hermina Barters  MD notified (1st page):  Dr Anthoney Harada  Time of first page:  [phone]  Notified Pharmacy of critical lab as well

## 2016-08-13 NOTE — Progress Notes (Signed)
VSS during 7 a to 7 p shift, BP soft 90's over 50's, Hydralazine held.  Oxygen saturation remains in 90's on room air.  Patient did ambulate entire length of unit with walker, tolerated well.

## 2016-08-13 NOTE — Plan of Care (Signed)
Problem: Pain Managment: Goal: General experience of comfort will improve Outcome: Progressing Patient reports decreased pain in right leg with pain management. Patient is able to ambulate better without complication. States PRN morphine eases pain

## 2016-08-13 NOTE — Progress Notes (Signed)
ANTICOAGULATION CONSULT NOTE - Initial Consult  Pharmacy Consult for Warfarin Indication: atrial fibrillation w/ MVR  Allergies  Allergen Reactions  . Ace Inhibitors Cough  . Codeine Other (See Comments)    Nausea and Feels Jittery   Patient Measurements: Height: 5\' 8"  (172.7 cm) Weight: 174 lb 4.8 oz (79.1 kg) (Scale B) IBW/kg (Calculated) : 63.9  Vital Signs: Temp: 97.4 F (36.3 C) (03/25 0538) Temp Source: Axillary (03/25 0538) BP: 110/79 (03/25 0538) Pulse Rate: 95 (03/25 0538)  Labs:  Recent Labs  08/11/16 0240 08/12/16 0040 08/13/16 0458  HGB 11.9* 13.0 11.9*  HCT 35.6* 39.2 36.2  PLT 232 237 247  LABPROT 33.7* 36.8* 46.4*  INR 3.23 3.60 4.81*  CREATININE 2.07* 2.72* 2.33*   Estimated Creatinine Clearance: 24.8 mL/min (A) (by C-G formula based on SCr of 2.33 mg/dL (H)).  Medical History: Past Medical History:  Diagnosis Date  . Acute kidney injury ([place])   . Atrial fibrillation ([place])   . Cardiomyopathy ([place])   . CHF (congestive heart failure) ([place])   . Coronary artery disease    4v CABG 6/17  . Hypertension   . 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 Thyroid disease    Medications:  Prescriptions Prior to Admission  Medication Sig Dispense Refill Last Dose  . amiodarone (PACERONE) 200 MG tablet Take 200 mg by mouth daily.   08/08/2016 at Unknown time  . aspirin 81 MG chewable tablet Chew 81 mg by mouth daily.   08/08/2016 at Unknown time  . hydrALAZINE (APRESOLINE) 10 MG tablet Take 10 mg by mouth 3 (three) times daily.   08/08/2016 at Unknown time  . levothyroxine (SYNTHROID, LEVOTHROID) 25 MCG tablet Take 25 mcg by mouth daily before breakfast.   08/08/2016 at Unknown time  . metoprolol succinate (TOPROL-XL) 50 MG 24 hr tablet Take 75 mg by mouth daily. Take with or immediately following a meal.   08/08/2016 at 0700  . potassium chloride (K-DUR,KLOR-CON) 10 MEQ tablet Take 10 mEq by mouth daily.   08/08/2016 at  Unknown time  . pravastatin (PRAVACHOL) 20 MG tablet Take 20 mg by mouth daily.   08/08/2016 at Unknown time  . spironolactone (ALDACTONE) 25 MG tablet Take 25 mg by mouth daily.   08/08/2016 at Unknown time  . torsemide (DEMADEX) 20 MG tablet Take 40 mg by mouth daily.   08/08/2016 at Unknown time  . warfarin (COUMADIN) 5 MG tablet Take 5 mg by mouth See admin instructions. 2.5mg  Tues Thurs, and 5mg  all other days   08/08/2016 at 1800  . predniSONE (DELTASONE) 20 MG tablet Take 2 tablets (40 mg total) by mouth daily with breakfast. For the next four days (Patient not taking: Reported on 08/09/2016) 8 tablet 0 Completed Course at Unknown time   Assessment: Patient is a 71 year old female admitted 3/21 w/ heart failure exacerbation. She is on chronic warfarin therapy PTA for a fib and MVR. Patient had recent elevated INR on 3/14 of 8.0, which she stated was due to drug interaction with prednisone. She was instructed to hold her warfarin for a few days, and she restarted her previous dose this past weekend (Sat or Sun). She is no longer taking the prednisone.   INR supratherapeutc: 4.81 -INR goal 2.5-3.5. Although valve is bioprosthetic, I confirmed with the patient that this is the correct goal.  -CBC stable, no bleeding reported  PTA dose: 2.5mg  Tues, Thurs, and Sat and 5mg  all other days per  patient and [facility] records  Goal of Therapy:  INR 2.5-3.5 Monitor platelets by anticoagulation protocol: Yes   Plan:  Hold warfarin Daily INR Monitor signs/symptoms of bleeding   Myer Peer Grayland Ormond), PharmD  PGY1 Pharmacy Resident Pager: [phone] 08/13/2016 9:19 AM

## 2016-08-13 NOTE — Progress Notes (Signed)
Progress Note  Patient Name: Jacqueline Palmer Date of Encounter: 08/13/2016  Primary Cardiologist:   [facility]  Subjective   Her foot pain is improved.  No acute SOB.   She is not quite at baseline.   Inpatient Medications    Scheduled Meds: . amiodarone  200 mg Oral Daily  . aspirin EC  81 mg Oral Daily  . hydrALAZINE  10 mg Oral TID  . levothyroxine  25 mcg Oral QAC breakfast  . metoprolol succinate  75 mg Oral Daily  . pravastatin  20 mg Oral Daily  . sodium chloride flush  3 mL Intravenous Q12H  . Warfarin - Pharmacist Dosing Inpatient   Does not apply q1800   Continuous Infusions:  PRN Meds: sodium chloride, bisacodyl, docusate sodium, morphine injection, ondansetron (ZOFRAN) IV, sodium chloride flush   Vital Signs    Vitals:   08/12/16 1200 08/12/16 1632 08/12/16 2015 08/13/16 0538  BP: 106/81 101/76 94/67 110/79  Pulse: 92  92 95  Resp: 18  17 16   Temp: 97.7 F (36.5 C)  97.4 F (36.3 C) 97.4 F (36.3 C)  TempSrc: Oral  Oral Axillary  SpO2: 100%  100% 100%  Weight:    174 lb 4.8 oz (79.1 kg)  Height:        Intake/Output Summary (Last 24 hours) at 08/13/16 0901 Last data filed at 08/13/16 0539  Gross per 24 hour  Intake              600 ml  Output             1000 ml  Net             -400 ml   Filed Weights   08/11/16 0418 08/12/16 0546 08/13/16 0538  Weight: 170 lb 12.8 oz (77.5 kg) 173 lb 1.6 oz (78.5 kg) 174 lb 4.8 oz (79.1 kg)    Telemetry    Artifact on EKG. NSR.   - Personally Reviewed  ECG    NA - Personally Reviewed  Physical Exam   GEN: No acute distress.   Neck:  Positive JVD Cardiac: Irregular RR, NO murmurs, rubs, or gallops.  Respiratory: Clear  to auscultation bilaterally. GI: Soft, nontender, non-distended  MS: Mild ankle edema; No deformity. Neuro:  Nonfocal  Psych: Normal affect   Labs    Chemistry  Recent Labs Lab 08/11/16 0240 08/12/16 0040 08/13/16 0458  NA 133* 133* 133*  K 4.2 5.4* 4.6  CL 99* 101 99*    CO2 23 19* 24  GLUCOSE 107* 135* 87  BUN 20 26* 31*  CREATININE 2.07* 2.72* 2.33*  CALCIUM 8.9 9.0 8.6*  GFRNONAA 23* 17* 20*  GFRAA 27* 19* 23*  ANIONGAP 11 13 10      Hematology  Recent Labs Lab 08/11/16 0240 08/12/16 0040 08/13/16 0458  WBC 5.9 5.7 7.3  RBC 4.78 5.24* 4.86  HGB 11.9* 13.0 11.9*  HCT 35.6* 39.2 36.2  MCV 74.5* 74.8* 74.5*  MCH 24.9* 24.8* 24.5*  MCHC 33.4 33.2 32.9  RDW 17.0* 17.1* 17.2*  PLT 232 237 247    Cardiac Enzymes  Recent Labs Lab 08/09/16 1004  TROPONINI <0.03   No results for input(s): TROPIPOC in the last 168 hours.   BNP  Recent Labs Lab 08/09/16 1004  BNP 1,827.3*     DDimer No results for input(s): DDIMER in the last 168 hours.   Radiology    No results found.  Cardiac Studies   NA  Patient Profile     71 y.o. female with PMH of permanent afib s/p ablation and MAZE with left atrial appendage ligation, chronic systolic HF, HTN, s/p bioprosthetic MVR with 4v CABG who presented with increasing dyspnea and LE edema.   Assessment & Plan    ACUTE ON CHRONIC SYSTOLIC HF:   Her weight is about the same.   Mildly net negative since admission.   The ultimate plan is a BiV device at [facility] in April.   Her biggest issues is the ankle edema and she doesn't want to go home until this is improved.  I will add compression stockings and start PO diuretic as below.   CKD STAGE IV:    Creat is down slightly today. Diuretic held yesterday.  I will restart PO   PERMANENT ATRIAL FIB:  On anticoagulation.  NSR on tele.  Supratherapeutic INR.  Management per pharmacy.   HTN:  BP is somewhat low on current meds.   No change in meds.   CAD: No evidence of ongoing chest pain.  No further ischemia work up.    FOOT PAIN:  Likely gout.  Ambulated yesterday.   Improved  Signed, Minus Breeding, MD  08/13/2016, 9:01 AM

## 2016-08-14 ENCOUNTER — Encounter (HOSPITAL_COMMUNITY): Payer: Self-pay | Admitting: Physician Assistant

## 2016-08-14 ENCOUNTER — Telehealth: Payer: Self-pay | Admitting: Cardiology

## 2016-08-14 DIAGNOSIS — M79671 Pain in right foot: Secondary | ICD-10-CM

## 2016-08-14 DIAGNOSIS — Z7901 Long term (current) use of anticoagulants: Secondary | ICD-10-CM

## 2016-08-14 DIAGNOSIS — M109 Gout, unspecified: Secondary | ICD-10-CM | POA: Diagnosis present

## 2016-08-14 DIAGNOSIS — I4891 Unspecified atrial fibrillation: Secondary | ICD-10-CM

## 2016-08-14 DIAGNOSIS — I482 Chronic atrial fibrillation, unspecified: Secondary | ICD-10-CM | POA: Diagnosis present

## 2016-08-14 HISTORY — DX: Long term (current) use of anticoagulants: Z79.01

## 2016-08-14 HISTORY — DX: Chronic atrial fibrillation, unspecified: I48.20

## 2016-08-14 LAB — CBC
HCT: 38.7 % (ref 36.0–46.0)
Hemoglobin: 12.7 g/dL (ref 12.0–15.0)
MCH: 24.8 pg — ABNORMAL LOW (ref 26.0–34.0)
MCHC: 32.8 g/dL (ref 30.0–36.0)
MCV: 75.4 fL — ABNORMAL LOW (ref 78.0–100.0)
Platelets: 270 10*3/uL (ref 150–400)
RBC: 5.13 MIL/uL — ABNORMAL HIGH (ref 3.87–5.11)
RDW: 17.5 % — ABNORMAL HIGH (ref 11.5–15.5)
WBC: 7.6 10*3/uL (ref 4.0–10.5)

## 2016-08-14 LAB — BASIC METABOLIC PANEL
Anion gap: 12 (ref 5–15)
BUN: 26 mg/dL — ABNORMAL HIGH (ref 6–20)
CO2: 25 mmol/L (ref 22–32)
Calcium: 8.6 mg/dL — ABNORMAL LOW (ref 8.9–10.3)
Chloride: 98 mmol/L — ABNORMAL LOW (ref 101–111)
Creatinine, Ser: 2.01 mg/dL — ABNORMAL HIGH (ref 0.44–1.00)
GFR calc Af Amer: 28 mL/min — ABNORMAL LOW (ref >60)
GFR calc non Af Amer: 24 mL/min — ABNORMAL LOW (ref >60)
Glucose, Bld: 81 mg/dL (ref 65–99)
Potassium: 4.2 mmol/L (ref 3.5–5.1)
Sodium: 135 mmol/L (ref 135–145)

## 2016-08-14 LAB — PROTIME-INR
INR: 3.48
Prothrombin Time: 35.8 seconds — ABNORMAL HIGH (ref 11.4–15.2)

## 2016-08-14 MED ORDER — BISACODYL 5 MG PO TBEC: 5.0000 mg | DELAYED_RELEASE_TABLET | Freq: Every day | ORAL | 0 refills | Status: DC | PRN

## 2016-08-14 MED ORDER — WARFARIN SODIUM 5 MG PO TABS: 5.0000 mg | ORAL_TABLET | ORAL | 1 refills | Status: DC

## 2016-08-14 MED ORDER — WARFARIN SODIUM 2.5 MG PO TABS
2.5000 mg | ORAL_TABLET | Freq: Once | ORAL | Status: AC
  Administered 2016-08-14: 2.5 mg via ORAL
  Filled 2016-08-14: qty 1

## 2016-08-14 MED ORDER — DOCUSATE SODIUM 100 MG PO CAPS
100.0000 mg | ORAL_CAPSULE | Freq: Every day | ORAL | 0 refills | Status: DC | PRN
Start: 2016-08-14 — End: 2020-02-19

## 2016-08-14 MED ORDER — HYDROCODONE-ACETAMINOPHEN 5-325 MG PO TABS
1.0000 | ORAL_TABLET | ORAL | Status: DC | PRN
  Administered 2016-08-14 – 2016-08-15 (×4): 2 via ORAL
  Filled 2016-08-14 (×4): qty 2

## 2016-08-14 NOTE — Care Management Important Message (Signed)
Important Message  Patient Details  Name: Jacqueline Palmer MRN: [id number] Date of Birth: [birth year]   Medicare Important Message Given:  Yes    Orbie Pyo 08/14/2016, 12:38 PM

## 2016-08-14 NOTE — Progress Notes (Signed)
ANTICOAGULATION CONSULT NOTE - Follow Up Consult  Pharmacy Consult for Coumadin Indication: atrial fibrillation, hx LAA thrombus  Allergies  Allergen Reactions  . Ace Inhibitors Cough  . Codeine Other (See Comments)    Nausea and Feels Jittery    Patient Measurements: Height: 5\' 8"  (172.7 cm) Weight: 172 lb 3.2 oz (78.1 kg) (Scale B) IBW/kg (Calculated) : 63.9  Vital Signs: Temp: 97.6 F (36.4 C) (03/26 0537) Temp Source: Oral (03/26 0537) BP: 103/72 (03/26 0537) Pulse Rate: 91 (03/26 0537)  Labs:  Recent Labs  08/12/16 0040 08/13/16 0458 08/14/16 0559  HGB 13.0 11.9* 12.7  HCT 39.2 36.2 38.7  PLT 237 247 270  LABPROT 36.8* 46.4* 35.8*  INR 3.60 4.81* 3.48  CREATININE 2.72* 2.33* 2.01*    Estimated Creatinine Clearance: 28.6 mL/min (A) (by C-G formula based on SCr of 2.01 mg/dL (H)).  Assessment: Patient is a 71 year old female admitted 3/21 w/ heart failure exacerbation. She is on chronic warfarin therapy PTA for a fib and LAA. Hx bioprosthetic MVR.  Patient had recent elevated INR on 3/14 of 8.0, which she stated was due to drug interaction with prednisone. She was instructed to hold her warfarin for a 3 days, and she restarted her previous dose on 3/24. She is no longer taking the prednisone.     Outpatient INR goal 2.5-3.5 Home regimen: 2.5 mg on Tues and Thursday, 5 mg all other days. (previously took 2.5 mg on Saturdays as well). Has had home monitor for INR checks since Dec/January, managed by [facility].    INR 3.48 today, upper end of target range.  No Coumadin given on 3/25 with INR 4.81.  Goal of Therapy:  INR 2.5-3.5 Monitor platelets by anticoagulation protocol: Yes   Plan:   Coumadin 2.5 mg today (instead of usual Monday dose of 5 mg).  Discussed with patient.  Daily PT/INR.  Arty Baumgartner, [place] Pager: [phone] 08/14/2016,1:04 PM

## 2016-08-14 NOTE — Telephone Encounter (Signed)
TCM Phone Call-Please call pt,she has an appt with Cecilie Kicks on 08-24-16 at 9:00

## 2016-08-14 NOTE — Discharge Summary (Signed)
Discharge Summary    Patient ID: Jacqueline Palmer,  MRN: [id number], DOB/AGE: 71/10/1945 71 y.o.  Admit date: 08/09/2016 Discharge date: 08/15/2016  Primary Care Provider: No PCP Per Patient Primary Cardiologist: [facility]   Discharge Diagnoses    Principal Problem:   Acute on chronic systolic heart failure ([place]) Active Problems:   Gout attack   Chronic a-fib ([facility])   Chronic anticoagulation   Allergies Allergies  Allergen Reactions  . Ace Inhibitors Cough  . Codeine Other (See Comments)    Nausea and Feels Jittery    Diagnostic Studies/Procedures    Xray results below _____________   History of Present Illness     71 yo female with PMH of permanent afib s/p ablation and MAZE with left atrial appendage ligation 2017 at time of CABG, chronic systolic HF, HTN, s/p bioprosthetic MVR with 4v CABG 2017, CKD IV, EF 20%. [facility] MD has discussed BiV ICD. Pt presented 03/21 with foot pain, increasing dyspnea, and LE edema.   Hospital Course     Consultants: Ortho   CHF: Her BNP was significantly elevated. She had a recent echo at [facility] showing her EF to be 20%. This was not repeated. Ms. Yoshino was initially diuresed with IV Lasix. Her renal function was followed closely during her hospital stay. Her baseline creatinine is a little greater than 2. Her BUN and creatinine was checked daily and peaked at 26/2.72 on 03/24. Her breathing had improved. The IV Lasix was discontinued and she was started on oral Torsemide at her home dose. Discharge labs are below. Compliance with a low sodium diet and daily weights is encouraged. Her Aldactone was held on admission. We will continue to hold it after discharge. They can possibly restarted as an outpatient if her renal function remains stable.  Gout: About 2 weeks ago, she was given a short course of prednisone as an outpatient. Although the CHF was more serious, the foot pain from the gout is the reason she came to the hospital. Her uric acid level  was elevated. She got a dose of Solu-Medrol and some pain control medications, but her symptoms did not improve. An orthopedic consult was called. Will be on short course of 0.6mg  BID colchicine for 7 days, and 10 days of Norco given by Ortho.   Atrial fib: Her atrial fib is permanent. She was on amiodarone prior to admission and this was continued. She had also been on Toprol-XL 75 mg a day and this was continued as well. Her heart rate was generally less than 110. It was more elevated when she was first admitted, this was felt secondary to pain and shortness of breath. By discharge, her heart rate was much better controlled. Her systolic blood pressure was sometimes a little less than 100 and never much over 120. No dose change in her medications was indicated.  Chronic anticoagulation: She was on Coumadin prior to admission and this was continued. She was therapeutic at discharge. Her INR was 2.47 on admission, but increased to 4.81 on her home dose. It was decided that her home dose should be decreased slightly. She was on 5 mg for 5 days a week and 2.5 mg the other 2 days. She will now be on 2.5 mg 4 days a week and 5 mg 3 days a week. She will check it at home later this week and then as scheduled.  Hypothyroidism: She was on thyroid supplement prior to admission with Synthroid 25 g daily. Her TSH was  checked and was elevated. We do not follow her for this and so will not change her Synthroid dose. However, she was advised to follow-up with the prescriber of the Synthroid to make sure her dose is high enough.  She had some problems with constipation during her hospital stay and was put on Dulcolax and Synthroid. She is encouraged to continue those as needed.  On 08/15/16, she was seen by Dr. Ellyn Hack and all data were reviewed. From a cardiology standpoint, she is stable.  _____________  Discharge Vitals Blood pressure 106/84, pulse 90, temperature 97.5 F (36.4 C), temperature source Oral,  resp. rate 18, height 5\' 8"  (1.727 m), weight 175 lb 3.2 oz (79.5 kg), SpO2 98 %.  Filed Weights   08/13/16 0538 08/14/16 0537 08/15/16 0531  Weight: 174 lb 4.8 oz (79.1 kg) 172 lb 3.2 oz (78.1 kg) 175 lb 3.2 oz (79.5 kg)    Labs & Radiologic Studies    CBC  Recent Labs  08/14/16 0559 08/15/16 0358  WBC 7.6 4.8  HGB 12.7 11.4*  HCT 38.7 34.4*  MCV 75.4* 75.3*  PLT 270 741   Basic Metabolic Panel  Recent Labs  08/13/16 0458 08/14/16 0559  NA 133* 135  K 4.6 4.2  CL 99* 98*  CO2 24 25  GLUCOSE 87 81  BUN 31* 26*  CREATININE 2.33* 2.01*  CALCIUM 8.6* 8.6*   Cardiac Enzymes Lab Results  Component Value Date   TROPONINI <0.03 08/09/2016   BNP B Natriuretic Peptide  Date Value Ref Range Status  08/09/2016 1,827.3 (H) 0.0 - 100.0 pg/mL Final   Thyroid Function Tests Lab Results  Component Value Date   TSH 21.073 (H) 08/09/2016   Lab Results  Component Value Date   INR 3.47 08/15/2016   INR 3.48 08/14/2016   INR 4.81 (HH) 08/13/2016   Lab Results  Component Value Date   LABURIC 13.3 (H) 08/11/2016  ________  Dg Ankle Complete Right Result Date: 08/10/2016 CLINICAL DATA:  Ankle pain and swelling without injury, initial encounter EXAM: RIGHT ANKLE - COMPLETE 3+ VIEW COMPARISON:  None. FINDINGS: No acute fracture or dislocation is noted. Diffuse soft tissue swelling is noted. Mild degenerative change of the talocalcaneal joint is seen. IMPRESSION: Soft tissue swelling without acute abnormality. Electronically Signed   By: Inez Catalina M.D.   On: 08/10/2016 11:57   Dg Chest Port 1 View Result Date: 08/09/2016 CLINICAL DATA:  Hypertension, shortness of Breath EXAM: PORTABLE CHEST 1 VIEW COMPARISON:  None. FINDINGS: Cardiomegaly. Prior CABG and aortic valve replacement. No confluent opacities or effusions. No acute bony abnormality. IMPRESSION: Cardiomegaly.  No active disease. Electronically Signed   By: Rolm Baptise M.D.   On: 08/09/2016 10:09   Dg Foot Complete  Right Result Date: 08/10/2016 CLINICAL DATA:  Right foot pain, no known injury, initial encounter EXAM: RIGHT FOOT COMPLETE - 3+ VIEW COMPARISON:  None. FINDINGS: Mild hallux valgus deformity is noted. No acute fracture or dislocation is noted. No soft tissue abnormality is noted. No acute abnormality noted. IMPRESSION: No acute abnormality noted. Electronically Signed   By: Inez Catalina M.D.   On: 08/10/2016 11:55   Disposition   Pt is being discharged home today in good condition.  Follow-up Plans & Appointments    Follow-up Information    Jettie Pagan, MD Follow up on 09/04/2016.   Specialty:  Cardiology Why:  Please keep f/u appt.  Contact information: [address] [place] [address] [phone]  Discharge Instructions    (HEART FAILURE PATIENTS) Call MD:  Anytime you have any of the following symptoms: 1) 3 pound weight gain in 24 hours or 5 pounds in 1 week 2) shortness of breath, with or without a dry hacking cough 3) swelling in the hands, feet or stomach 4) if you have to sleep on extra pillows at night in order to breathe.    Complete by:  As directed    Diet - low sodium heart healthy    Complete by:  As directed    Discharge instructions    Complete by:  As directed    For patients with congestive heart failure, we give them these special instructions:  1. Follow a low-salt diet and watch your fluid intake. In general, you should not be taking in more than 2 liters of fluid per day (no more than 8 glasses per day). Some patients are restricted to less than 1.5 liters of fluid per day (no more than 6 glasses per day). This includes sources of water in foods like soup, coffee, tea, milk, etc. 2. Weigh yourself on the same scale at same time of day and keep a log. 3. Call your doctor: (Anytime you feel any of the following symptoms)  - 3-4 pound weight gain in 1-2 days or 2 pounds overnight  - Shortness of breath, with or without a dry hacking cough  -  Swelling in the hands, feet or stomach  - If you have to sleep on extra pillows at night in order to breathe   IT IS IMPORTANT TO LET YOUR DOCTOR KNOW EARLY ON IF YOU ARE HAVING SYMPTOMS SO WE CAN HELP YOU!   Increase activity slowly    Complete by:  As directed       Discharge Medications   Current Discharge Medication List    START taking these medications   Details  bisacodyl (DULCOLAX) 5 MG EC tablet Take 1-2 tablets (5-10 mg total) by mouth daily as needed for moderate constipation. Qty: 30 tablet, Refills: 0    colchicine 0.6 MG tablet Take 1 tablet (0.6 mg total) by mouth 2 (two) times daily. Qty: 14 tablet, Refills: 0    docusate sodium (COLACE) 100 MG capsule Take 1 capsule (100 mg total) by mouth daily as needed for mild constipation or moderate constipation. Qty: 30 capsule, Refills: 0      CONTINUE these medications which have CHANGED   Details  warfarin (COUMADIN) 5 MG tablet Take 1 tablet (5 mg total) by mouth See admin instructions. 2.5mg  Mon, Tues Thurs, Sat and 5mg  Wed, Fri, Sunday Qty: 30 tablet, Refills: 1      CONTINUE these medications which have NOT CHANGED   Details  amiodarone (PACERONE) 200 MG tablet Take 200 mg by mouth daily.    aspirin 81 MG chewable tablet Chew 81 mg by mouth daily.    hydrALAZINE (APRESOLINE) 10 MG tablet Take 10 mg by mouth 3 (three) times daily.    levothyroxine (SYNTHROID, LEVOTHROID) 25 MCG tablet Take 25 mcg by mouth daily before breakfast.    metoprolol succinate (TOPROL-XL) 50 MG 24 hr tablet Take 75 mg by mouth daily. Take with or immediately following a meal.    potassium chloride (K-DUR,KLOR-CON) 10 MEQ tablet Take 10 mEq by mouth daily.    pravastatin (PRAVACHOL) 20 MG tablet Take 20 mg by mouth daily.    torsemide (DEMADEX) 20 MG tablet Take 40 mg by mouth daily.      STOP  taking these medications     spironolactone (ALDACTONE) 25 MG tablet      predniSONE (DELTASONE) 20 MG tablet             Outstanding Labs/Studies   None  Duration of Discharge Encounter   Greater than 30 minutes including physician time.  Signed, Reino Bellis NP 08/15/2016, 8:59 AM   I saw evaluated the patient on the day of discharge. Agree with the discharge summary noted above. She was admitted for what was thought to be heart failure exacerbation with lower extremity edema, but was probably more related to left foot/ankle pseudogout or gout related swelling. We did diurese her gently with oral diuretics and that improved some. She was evaluated by orthopedic surgery recommended continued treatment with hydrocodone and also adding colchicine.  She should contact her primary cardiologist prior to the scheduled procedure date to determine if she needs an updated H&P eval.  Glenetta Hew, M.D., M.S. Interventional Cardiologist   Pager # [phone] Phone # [phone] [address] [place], [place] [address]

## 2016-08-14 NOTE — Consult Note (Signed)
Reason for Consult: Right foot pain Referring Physician: Dr.Varanasi  Jacqueline Palmer is an 71 y.o. female.  HPI: Jniya is a 71 year old female who typically gets her health care at [facility] who is currently hospitalized for heart related issues.  She scheduled to have a pacemaker and defibrillator implanted in April.  She describes 3-4 day history of insidious onset right foot pain with no trauma.  She denies any known history of gout or pseudogout.  She states that weightbearing has been painful but while she has been taking Norco she has been able to walk around.  Past Medical History:  Diagnosis Date  . Acute kidney injury ([place])   . Atrial fibrillation ([place])   . Cardiomyopathy ([place])   . CHF (congestive heart failure) ([place])   . Chronic a-fib ([place]) 08/14/2016  . Chronic anticoagulation 08/14/2016  . Coronary artery disease    4v CABG 6/17  . Hypertension   . 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 Thyroid disease     Past Surgical History:  Procedure Laterality Date  . ABDOMINAL HYSTERECTOMY    . CORONARY ARTERY BYPASS GRAFT      History reviewed. No pertinent family history.  Social History:  reports that she quit smoking about 2 years ago. Her smoking use included Cigarettes. She has never used smokeless tobacco. She reports that she does not drink alcohol or use drugs.  Allergies:  Allergies  Allergen Reactions  . Ace Inhibitors Cough  . Codeine Other (See Comments)    Nausea and Feels Jittery    Medications: I have reviewed the patient's current medications.  Results for orders placed or performed during the hospital encounter of 08/09/16 (from the past 48 hour(s))  Basic metabolic panel     Status: Abnormal   Collection Time: 08/13/16  4:58 AM  Result Value Ref Range   Sodium 133 (L) 135 - 145 mmol/L   Potassium 4.6 3.5 - 5.1 mmol/L   Chloride 99 (L) 101 - 111 mmol/L   CO2 24 22 - 32 mmol/L   Glucose, Bld 87 65 - 99  mg/dL   BUN 31 (H) 6 - 20 mg/dL   Creatinine, Ser 2.33 (H) 0.44 - 1.00 mg/dL   Calcium 8.6 (L) 8.9 - 10.3 mg/dL   GFR calc non Af Amer 20 (L) >60 mL/min   GFR calc Af Amer 23 (L) >60 mL/min    Comment: (NOTE) The eGFR has been calculated using the CKD EPI equation. This calculation has not been validated in all clinical situations. eGFR's persistently <60 mL/min signify possible Chronic Kidney Disease.    Anion gap 10 5 - 15  Protime-INR     Status: Abnormal   Collection Time: 08/13/16  4:58 AM  Result Value Ref Range   Prothrombin Time 46.4 (H) 11.4 - 15.2 seconds   INR 4.81 (HH)     Comment: REPEATED TO VERIFY CRITICAL RESULT CALLED TO, READ BACK BY AND VERIFIED WITH: B DOADOO RN [phone] BY SHORTT   CBC     Status: Abnormal   Collection Time: 08/13/16  4:58 AM  Result Value Ref Range   WBC 7.3 4.0 - 10.5 K/uL   RBC 4.86 3.87 - 5.11 MIL/uL   Hemoglobin 11.9 (L) 12.0 - 15.0 g/dL   HCT 36.2 36.0 - 46.0 %   MCV 74.5 (L) 78.0 - 100.0 fL   MCH 24.5 (L) 26.0 - 34.0 pg   MCHC 32.9 30.0 - 36.0 g/dL  RDW 17.2 (H) 11.5 - 15.5 %   Platelets 247 150 - 400 K/uL  Basic metabolic panel     Status: Abnormal   Collection Time: 08/14/16  5:59 AM  Result Value Ref Range   Sodium 135 135 - 145 mmol/L   Potassium 4.2 3.5 - 5.1 mmol/L   Chloride 98 (L) 101 - 111 mmol/L   CO2 25 22 - 32 mmol/L   Glucose, Bld 81 65 - 99 mg/dL   BUN 26 (H) 6 - 20 mg/dL   Creatinine, Ser 2.01 (H) 0.44 - 1.00 mg/dL   Calcium 8.6 (L) 8.9 - 10.3 mg/dL   GFR calc non Af Amer 24 (L) >60 mL/min   GFR calc Af Amer 28 (L) >60 mL/min    Comment: (NOTE) The eGFR has been calculated using the CKD EPI equation. This calculation has not been validated in all clinical situations. eGFR's persistently <60 mL/min signify possible Chronic Kidney Disease.    Anion gap 12 5 - 15  Protime-INR     Status: Abnormal   Collection Time: 08/14/16  5:59 AM  Result Value Ref Range   Prothrombin Time 35.8 (H) 11.4 - 15.2  seconds   INR 3.48   CBC     Status: Abnormal   Collection Time: 08/14/16  5:59 AM  Result Value Ref Range   WBC 7.6 4.0 - 10.5 K/uL   RBC 5.13 (H) 3.87 - 5.11 MIL/uL   Hemoglobin 12.7 12.0 - 15.0 g/dL   HCT 38.7 36.0 - 46.0 %   MCV 75.4 (L) 78.0 - 100.0 fL   MCH 24.8 (L) 26.0 - 34.0 pg   MCHC 32.8 30.0 - 36.0 g/dL   RDW 17.5 (H) 11.5 - 15.5 %   Platelets 270 150 - 400 K/uL    No results found.  Review of Systems  Constitutional: Negative.   HENT: Negative.   Eyes: Negative.   Respiratory: Negative.   Cardiovascular: Negative.   Gastrointestinal: Negative.   Genitourinary: Negative.   Musculoskeletal: Positive for joint pain.  Skin: Negative.   Neurological: Negative.   Endo/Heme/Allergies: Negative.   Psychiatric/Behavioral: Negative.    Blood pressure 92/73, pulse 91, temperature 97.7 F (36.5 C), temperature source Oral, resp. rate 18, height '5\' 8"'  (1.727 m), weight 172 lb 3.2 oz (78.1 kg), SpO2 100 %. Physical Exam  Constitutional: She appears well-developed.  HENT:  Head: Normocephalic.  Eyes: Pupils are equal, round, and reactive to light.  Neck: Normal range of motion.  Cardiovascular: Normal rate.   Respiratory: Effort normal.  Neurological: She is alert.  Skin: Skin is warm.  Psychiatric: She has a normal mood and affect.  Right foot is examined.  Pedal pulses palpable.  There is swelling and warmth in the right forefoot region which is not present on the left.  Ankle dorsiflexion plantar flexion inversion eversion is intact.  Palpable nontender anterior tibial posterior tibial peroneal and Achilles tendon.  She does have painless passive motion of the right MTP joint #1.  No tissue crepitus fluctuance is noted.  No induration is present.  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  No evidence of infection.  Radiographs unremarkable.  I discussed with her different  treatment options including injection into the MTP joint of cortisone versus oral cortisone.  Most other anti-inflammatory type gout Medicine could interact with her blood thinners  Patient prefers to try pain medication for 2-3 days while this  passes.  It is improving some on its own.  Current length of symptoms about 3-4 days.  I think the one time prescription for the pain medicine is reasonable for now but moving forward should the symptoms recur I think a 6 day prednisone Dosepak or injection into the joint to be the quickest way for resolution with subsequent follow-up with her primary care provider.  One time prescription for pain medicine is left on the chart.  It is what she is currently using now.  Landry Dyke Darnell Stimson 08/14/2016, 11:17 PM

## 2016-08-14 NOTE — Progress Notes (Signed)
Pt c/o foot pain, likely gout as uric acid level is >13. Add Vicodin while in-hospital, discuss steroid rx w/ MD.  Rosaria Ferries, PA-C 08/14/2016 9:17 AM Beeper [phone]

## 2016-08-14 NOTE — Discharge Instructions (Signed)
You take Synthroid/levothyroxine for your thyroid. You need to make an appointment with the prescriber of this medication because the dose may not be high enough, based on labs we did here. Please try to see that provider within the next week or 2.   Warfarin tablets What is this medicine? WARFARIN (WAR far in) is an anticoagulant. It is used to treat or prevent clots in the veins, arteries, lungs, or heart. This medicine may be used for other purposes; ask your health care provider or pharmacist if you have questions. COMMON BRAND NAME(S): Coumadin, Jantoven What should I tell my health care provider before I take this medicine? They need to know if you have any of these conditions: -alcoholism -anemia -bleeding disorders -cancer -diabetes -heart disease -high blood pressure -history of bleeding in the gastrointestinal tract -history of stroke or other brain injury or disease -kidney or liver disease -protein C deficiency -protein S deficiency -psychosis or dementia -recent injury, recent or planned surgery or procedure -an unusual or allergic reaction to warfarin, other medicines, foods, dyes, or preservatives -pregnant or trying to get pregnant -breast-feeding How should I use this medicine? Take this medicine by mouth with a glass of water. Follow the directions on the prescription label. You can take this medicine with or without food. Take your medicine at the same time each day. Do not take it more often than directed. Do not stop taking except on your doctor's advice. Stopping this medicine may increase your risk of a blood clot. Be sure to refill your prescription before you run out of medicine. If your doctor or healthcare professional calls to change your dose, write down the dose and any other instructions. Always read the dose and instructions back to him or her to make sure you understand them. Tell your doctor or healthcare professional what strength of tablets you have on  hand. Ask how many tablets you should take to equal your new dose. Write the date on the new instructions and keep them near your medicine. If you are told to stop taking your medicine until your next blood test, call your doctor or healthcare professional if you do not hear anything within 24 hours of the test to find out your new dose or when to restart your prior dose. A special MedGuide will be given to you by the pharmacist with each prescription and refill. Be sure to read this information carefully each time. Talk to your pediatrician regarding the use of this medicine in children. Special care may be needed. Overdosage: If you think you have taken too much of this medicine contact a poison control center or emergency room at once. NOTE: This medicine is only for you. Do not share this medicine with others. What if I miss a dose? It is important not to miss a dose. If you miss a dose, call your healthcare provider. Take the dose as soon as possible on the same day. If it is almost time for your next dose, take only that dose. Do not take double or extra doses to make up for a missed dose. What may interact with this medicine? Do not take this medicine with any of the following medications: -agents that prevent or dissolve blood clots -aspirin or other salicylates -danshen -dextrothyroxine -mifepristone -St. John's Wort -red yeast rice This medicine may also interact with the following medications: -acetaminophen -agents that lower cholesterol -alcohol -allopurinol -amiodarone -antibiotics or medicines for treating bacterial, fungal or viral infections -azathioprine -barbiturate medicines for inducing  sleep or treating seizures -certain medicines for diabetes -certain medicines for heart rhythm problems -certain medicines for hepatitis C virus infections like daclatasvir, dasabuvir; ombitasvir; paritaprevir; ritonavir, elbasvir; grazoprevir, ledipasvir; sofosbuvir, simeprevir,  sofosbuvir, sofosbuvir; velpatasvir, sofosbuvir; velpatasvir; voxilaprevir -certain medicines for high blood pressure -chloral hydrate -cisapride -conivaptan -disulfiram -female hormones, including contraceptive or birth control pills -general anesthetics -herbal or dietary products like garlic, ginkgo, ginseng, green tea, or kava kava -influenza virus vaccine -female hormones -medicines for mental depression or psychosis -medicines for some types of cancer -medicines for stomach problems -methylphenidate -NSAIDs, medicines for pain and inflammation, like ibuprofen or naproxen -propoxyphene -quinidine, quinine -raloxifene -seizure or epilepsy medicine like carbamazepine, phenytoin, and valproic acid -steroids like cortisone and prednisone -tamoxifen -thyroid medicine -tramadol -vitamin c, vitamin e, and vitamin K -zafirlukast -zileuton This list may not describe all possible interactions. Give your health care provider a list of all the medicines, herbs, non-prescription drugs, or dietary supplements you use. Also tell them if you smoke, drink alcohol, or use illegal drugs. Some items may interact with your medicine. What should I watch for while using this medicine? Visit your doctor or health care professional for regular checks on your progress. You will need to have a blood test called a PT/INR regularly. The PT/INR blood test is done to make sure you are getting the right dose of this medicine. It is important to not miss your appointment for the blood tests. When you first start taking this medicine, these tests are done often. Once the correct dose is determined and you take your medicine properly, these tests can be done less often. Notify your doctor or health care professional and seek emergency treatment if you develop breathing problems; changes in vision; chest pain; severe, sudden headache; pain, swelling, warmth in the leg; trouble speaking; sudden numbness or weakness of  the face, arm or leg. These can be signs that your condition has gotten worse. While you are taking this medicine, carry an identification card with your name, the name and dose of medicine(s) being used, and the name and phone number of your doctor or health care professional or person to contact in an emergency. Do not start taking or stop taking any medicines or over-the-counter medicines except on the advice of your doctor or health care professional. You should discuss your diet with your doctor or health care professional. Do not make major changes in your diet. Vitamin K can affect how well this medicine works. Many foods contain vitamin K. It is important to eat a consistent amount of foods with vitamin K. Other foods with vitamin K that you should eat in consistent amounts are asparagus, basil, black eyed peas, broccoli, brussel sprouts, cabbage, green onions, green tea, parsley, green leafy vegetables like beet greens, collard greens, kale, spinach, turnip greens, or certain lettuces like green leaf or romaine. This medicine can cause birth defects or bleeding in an unborn child. Women of childbearing age should use effective birth control while taking this medicine. If a woman becomes pregnant while taking this medicine, she should discuss the potential risks and her options with her health care professional. Avoid sports and activities that might cause injury while you are using this medicine. Severe falls or injuries can cause unseen bleeding. Be careful when using sharp tools or knives. Consider using an Copy. Take special care brushing or flossing your teeth. Report any injuries, bruising, or red spots on the skin to your doctor or health care professional. If you have  an illness that causes vomiting, diarrhea, or fever for more than a few days, contact your doctor. Also check with your doctor if you are unable to eat for several days. These problems can change the effect of this  medicine. Even after you stop taking this medicine, it takes several days before your body recovers its normal ability to clot blood. Ask your doctor or health care professional how long you need to be careful. If you are going to have surgery or dental work, tell your doctor or health care professional that you have been taking this medicine. What side effects may I notice from receiving this medicine? Side effects that you should report to your doctor or health care professional as soon as possible: -allergic reactions like skin rash, itching or hives, swelling of the face, lips, or tongue -breathing problems -chest pain -dizziness -headache -heavy menstrual bleeding or vaginal bleeding -pain in the lower back or side -painful, blue or purple toes -painful skin ulcers that do not go away -signs and symptoms of bleeding such as bloody or black, tarry stools; red or dark-brown urine; spitting up blood or brown material that looks like coffee grounds; red spots on the skin; unusual bruising or bleeding from the eye, gums, or nose -stomach pain -unusually weak or tired Side effects that usually do not require medical attention (report to your doctor or health care professional if they continue or are bothersome): -diarrhea -hair loss This list may not describe all possible side effects. Call your doctor for medical advice about side effects. You may report side effects to FDA at 1-800-FDA-1088. Where should I keep my medicine? Keep out of the reach of children. Store at room temperature between 15 and 30 degrees C (59 and 86 degrees F). Protect from light. Throw away any unused medicine after the expiration date. Do not flush down the toilet. NOTE: This sheet is a summary. It may not cover all possible information. If you have questions about this medicine, talk to your doctor, pharmacist, or health care provider.  2018 Elsevier/Gold Standard (2016-04-27 11:27:41)

## 2016-08-14 NOTE — Progress Notes (Signed)
Progress Note  Patient Name: Jacqueline Palmer Date of Encounter: 08/14/2016  Primary Cardiologist:   Duke MD  Subjective   Her foot pain is improved.  No acute SOB.   She is not quite at baseline b/c unable to be mobile.  Wants to be able to walk prior to d/c   Inpatient Medications    Scheduled Meds: . amiodarone  200 mg Oral Daily  . aspirin EC  81 mg Oral Daily  . hydrALAZINE  10 mg Oral TID  . levothyroxine  25 mcg Oral QAC breakfast  . metoprolol succinate  75 mg Oral Daily  . pravastatin  20 mg Oral Daily  . sodium chloride flush  3 mL Intravenous Q12H  . torsemide  40 mg Oral Daily  . Warfarin - Pharmacist Dosing Inpatient   Does not apply q1800   Continuous Infusions:  PRN Meds: sodium chloride, bisacodyl, docusate sodium, HYDROcodone-acetaminophen, morphine injection, ondansetron (ZOFRAN) IV, sodium chloride flush   Vital Signs    Vitals:   08/13/16 1230 08/13/16 1705 08/13/16 2044 08/14/16 0537  BP: 91/62 95/69 93/74  103/72  Pulse: 93 90 90 91  Resp: 18  17 18   Temp: 97.5 F (36.4 C)  97.8 F (36.6 C) 97.6 F (36.4 C)  TempSrc: Oral  Oral Oral  SpO2: 100%  100% 98%  Weight:    78.1 kg (172 lb 3.2 oz)  Height:        Intake/Output Summary (Last 24 hours) at 08/14/16 1057 Last data filed at 08/14/16 0910  Gross per 24 hour  Intake              895 ml  Output              500 ml  Net              395 ml   Filed Weights   08/12/16 0546 08/13/16 0538 08/14/16 0537  Weight: 78.5 kg (173 lb 1.6 oz) 79.1 kg (174 lb 4.8 oz) 78.1 kg (172 lb 3.2 oz)    Telemetry    Artifact on EKG. NSR.   - Personally Reviewed  ECG    NA - Personally Reviewed  Physical Exam   GEN: No acute distress.   Neck:  Positive JVD -with mild HJR Cardiac: Irregular RR - lots of ectopy, NO murmurs, rubs, ? Split S2 vs. gallop.  Respiratory: Clear  to auscultation bilaterally. GI: Soft, nontender, non-distended  MS: Mild ankle edema; No deformity. R ankle /foot tender to  palpation - not truly warm,. More painful on dorsal aspect of great toe. Neuro:  Nonfocal  Psych: Normal affect   Labs    Chemistry  Recent Labs Lab 08/12/16 0040 08/13/16 0458 08/14/16 0559  NA 133* 133* 135  K 5.4* 4.6 4.2  CL 101 99* 98*  CO2 19* 24 25  GLUCOSE 135* 87 81  BUN 26* 31* 26*  CREATININE 2.72* 2.33* 2.01*  CALCIUM 9.0 8.6* 8.6*  GFRNONAA 17* 20* 24*  GFRAA 19* 23* 28*  ANIONGAP 13 10 12      Hematology  Recent Labs Lab 08/12/16 0040 08/13/16 0458 08/14/16 0559  WBC 5.7 7.3 7.6  RBC 5.24* 4.86 5.13*  HGB 13.0 11.9* 12.7  HCT 39.2 36.2 38.7  MCV 74.8* 74.5* 75.4*  MCH 24.8* 24.5* 24.8*  MCHC 33.2 32.9 32.8  RDW 17.1* 17.2* 17.5*  PLT 237 247 270    Cardiac Enzymes  Recent Labs Lab 08/09/16 1004  TROPONINI <0.03  No results for input(s): TROPIPOC in the last 168 hours.   BNP  Recent Labs Lab 08/09/16 1004  BNP 1,827.3*     DDimer No results for input(s): DDIMER in the last 168 hours.   Radiology    No results found.  Cardiac Studies   NA  Patient Profile     71 y.o. female with PMH of permanent afib s/p ablation and MAZE with left atrial appendage ligation, chronic systolic HF, HTN, s/p bioprosthetic MVR with 4v CABG who presented with increasing dyspnea and LE edema.  He most notable complaint is her R foot pain.  Assessment & Plan    ACUTE ON CHRONIC SYSTOLIC HF:   Her weight is about the same.   Mildly net negative since admission.   The ultimate plan is a BiV device at [facility] in April.   Her biggest issues is the ankle edema and she doesn't want to go home until this is improved.  I will add compression stockings and start PO diuretic as below.   CKD STAGE IV:    Creat is down slightly today. Diuretic held yesterday.  I will restart PO   PERMANENT ATRIAL FIB:  On anticoagulation.  NSR on tele.  Supratherapeutic INR.  Management per pharmacy.   HTN:  BP is somewhat low on current meds.   No change in meds.   CAD: No  evidence of ongoing chest pain.  No further ischemia work up.    FOOT PAIN:  Likely gou - elevated uric acid..  Still painful.  She does not want to use steroids -- will add colchicine & continue PRN Norco.  Since this is the main concern, I agreed to consult [facility] to evaluate for recommendations. Rec: ambulate  Hopefully d/c home once we develop a  Rx plan for R foot pain.  Signed, Glenetta Hew, MD  08/14/2016, 10:57 AM

## 2016-08-15 DIAGNOSIS — I482 Chronic atrial fibrillation: Secondary | ICD-10-CM

## 2016-08-15 DIAGNOSIS — M10072 Idiopathic gout, left ankle and foot: Secondary | ICD-10-CM

## 2016-08-15 LAB — CBC
HCT: 34.4 % — ABNORMAL LOW (ref 36.0–46.0)
Hemoglobin: 11.4 g/dL — ABNORMAL LOW (ref 12.0–15.0)
MCH: 24.9 pg — ABNORMAL LOW (ref 26.0–34.0)
MCHC: 33.1 g/dL (ref 30.0–36.0)
MCV: 75.3 fL — ABNORMAL LOW (ref 78.0–100.0)
Platelets: 264 10*3/uL (ref 150–400)
RBC: 4.57 MIL/uL (ref 3.87–5.11)
RDW: 17.8 % — ABNORMAL HIGH (ref 11.5–15.5)
WBC: 4.8 10*3/uL (ref 4.0–10.5)

## 2016-08-15 LAB — PROTIME-INR
INR: 3.47
Prothrombin Time: 35.7 seconds — ABNORMAL HIGH (ref 11.4–15.2)

## 2016-08-15 MED ORDER — COLCHICINE 0.6 MG PO TABS: 0.6000 mg | ORAL_TABLET | Freq: Two times a day (BID) | ORAL | 0 refills | Status: DC

## 2016-08-15 MED ORDER — COLCHICINE 0.6 MG PO TABS
0.6000 mg | ORAL_TABLET | Freq: Two times a day (BID) | ORAL | Status: DC
  Administered 2016-08-15: 0.6 mg via ORAL
  Filled 2016-08-15: qty 1

## 2016-08-15 NOTE — Progress Notes (Signed)
qPhysical Therapy Treatment Patient Details Name: Jacqueline Palmer MRN: [id number] DOB: 13-May-1946 Today's Date: 08/15/2016    History of Present Illness 71 yo female with PMH of permanent afib s/p ablation and MAZE with left atrial appendage ligation, chronic systolic HF, HTN, s/p bioprosthetic MVR with 4v CABG who presented with increasing dyspnea and LE edema. Dx of acute on chronic CHF. SHe also has questionable Rt foot gout flare.    PT Comments    Pt's current mobility adequate for discharge home alone. Discussed with pt cooking modifications, safety considerations for shopping to manage fatigue and Rt ankle pain with return demo understanding.  Pt feels confident to return home.  VItals good during session.  Pt already has quad cane and may benefit from RW to manage longer distances within her home and community.    Follow Up Recommendations  No PT follow up     Equipment Recommendations  Rolling walker with 5" wheels;3in1 (PT)    Recommendations for Other Services       Precautions / Restrictions Precautions Precautions: Fall Precaution Comments: reliance on assistive device, pain in foot Restrictions Weight Bearing Restrictions: No    Mobility  Bed Mobility Overal bed mobility: Independent             General bed mobility comments: supine to sit  Transfers Overall transfer level: Independent Equipment used: Radio broadcast assistant: Sit to/from [employer] to Stand: Modified independent (Device/Increase time) Stand pivot transfers: Modified independent (Device/Increase time)       General transfer comment: from bed  Ambulation/Gait Ambulation/Gait assistance: Modified independent (Device/Increase time) Ambulation Distance (Feet): 225 Feet Assistive device: Quad cane Gait Pattern/deviations: Step-to pattern;Decreased step length - left;Decreased stance time - right     General Gait Details: min cues for cane sequencing and to use in left  hand Vs Rt. to manage Rt foot pain   Stairs            Wheelchair Mobility    Modified Rankin (Stroke Patients Only)       Balance Overall balance assessment: Modified Independent                                          Cognition Arousal/Alertness: Awake/alert Behavior During Therapy: WFL for tasks assessed/performed Overall Cognitive Status: Within Functional Limits for tasks assessed                                        Exercises      General Comments General comments (skin integrity, edema, etc.): post gait HR 97 bpm, oxygen saturation on RA 100%      Pertinent Vitals/Pain Pain Assessment: 0-10 Pain Score: 4  Pain Location: Rt foot Pain Descriptors / Indicators: Aching;Sore Pain Intervention(s): Limited activity within patient's tolerance;Monitored during session;RN gave pain meds during session    Home Living                      Prior Function            PT Goals (current goals can now be found in the care plan section) Acute Rehab PT Goals Patient Stated Goal: get foot feeling better PT Goal Formulation: With patient Time For Goal Achievement: 08/25/16 Potential to Achieve Goals: Good Progress towards PT goals:  Progressing toward goals    Frequency    Min 3X/week      PT Plan Current plan remains appropriate    Co-evaluation             End of Session Equipment Utilized During Treatment: Gait belt Activity Tolerance: Patient tolerated treatment well Patient left: in chair;with call bell/phone within reach Nurse Communication: Mobility status PT Visit Diagnosis: Pain;Other abnormalities of gait and mobility (R26.89);Difficulty in walking, not elsewhere classified (R26.2) Pain - Right/Left: Right Pain - part of body: Ankle and joints of foot     Time: 0920-0955 PT Time Calculation (min) (ACUTE ONLY): 35 min  Charges:  $Gait Training: 23-37 mins $Self Care/Home Management:  01/27/23                    G Codes:    Suszanne Finch, PT [phone]  [place] 08/15/2016, 10:28 AM

## 2016-08-15 NOTE — Progress Notes (Signed)
Progress Note  Patient Name: Jacqueline Palmer Date of Encounter: 08/15/2016  Primary Cardiologist:   Jacqueline Hickman MD - Dr. Gayland Palmer at [place]. [place], [place]. Phone #[phone].  Subjective   Her foot pain is improved.  No acute SOB.  She was actually able to walk in the hall without the cane without too much pain. Ready to go home   Inpatient Medications    Scheduled Meds: . amiodarone  200 mg Oral Daily  . aspirin EC  81 mg Oral Daily  . colchicine  0.6 mg Oral BID  . hydrALAZINE  10 mg Oral TID  . levothyroxine  25 mcg Oral QAC breakfast  . metoprolol succinate  75 mg Oral Daily  . pravastatin  20 mg Oral Daily  . sodium chloride flush  3 mL Intravenous Q12H  . torsemide  40 mg Oral Daily  . Warfarin - Pharmacist Dosing Inpatient   Does not apply q1800   Continuous Infusions:  PRN Meds: sodium chloride, bisacodyl, docusate sodium, HYDROcodone-acetaminophen, morphine injection, ondansetron (ZOFRAN) IV, sodium chloride flush   Vital Signs    Vitals:   08/14/16 1707 08/14/16 2100 08/14/16 2300 08/15/16 0531  BP: 92/67 92/73 91/64  106/84  Pulse: 86 91  90  Resp: 18 18    Temp: 97.7 F (36.5 C)   97.5 F (36.4 C)  TempSrc: Oral   Oral  SpO2: 100% 100%  98%  Weight:    79.5 kg (175 lb 3.2 oz)  Height:        Intake/Output Summary (Last 24 hours) at 08/15/16 0830 Last data filed at 08/15/16 0535  Gross per 24 hour  Intake             1015 ml  Output             1600 ml  Net             -585 ml   Filed Weights   08/13/16 0538 08/14/16 0537 08/15/16 0531  Weight: 79.1 kg (174 lb 4.8 oz) 78.1 kg (172 lb 3.2 oz) 79.5 kg (175 lb 3.2 oz)    Telemetry    Artifact on EKG. NSR.   - Personally Reviewed  ECG    NA - Personally Reviewed  Physical Exam   GEN: No acute distress.   Neck:  Positive JVD -with mild HJR Cardiac: Irregular RR - lots of ectopy, NO murmurs, rubs, ? Split S2 vs. gallop.  Respiratory: Clear  to  auscultation bilaterally. GI: Soft, nontender, non-distended  MS: Mild ankle edema; No deformity. R ankle /foot tender to palpation - not truly warm,. painful on dorsal aspect of great toe - notably improved from yesterday. Neuro:  Nonfocal  Psych: Normal affect   Labs    Chemistry  Recent Labs Lab 08/12/16 0040 08/13/16 0458 08/14/16 0559  NA 133* 133* 135  K 5.4* 4.6 4.2  CL 101 99* 98*  CO2 19* 24 25  GLUCOSE 135* 87 81  BUN 26* 31* 26*  CREATININE 2.72* 2.33* 2.01*  CALCIUM 9.0 8.6* 8.6*  GFRNONAA 17* 20* 24*  GFRAA 19* 23* 28*  ANIONGAP 13 10 12      Hematology  Recent Labs Lab 08/13/16 0458 08/14/16 0559 08/15/16 0358  WBC 7.3 7.6 4.8  RBC 4.86 5.13* 4.57  HGB 11.9* 12.7 11.4*  HCT 36.2 38.7 34.4*  MCV 74.5* 75.4* 75.3*  MCH 24.5* 24.8* 24.9*  MCHC 32.9 32.8 33.1  RDW 17.2* 17.5*  17.8*  PLT 247 270 264    Cardiac Enzymes  Recent Labs Lab 08/09/16 1004  TROPONINI <0.03   No results for input(s): TROPIPOC in the last 168 hours.   BNP  Recent Labs Lab 08/09/16 1004  BNP 1,827.3*     DDimer No results for input(s): DDIMER in the last 168 hours.   Radiology    No results found.  Cardiac Studies   NA  Patient Profile     71 y.o. female with PMH of permanent afib s/p ablation and MAZE with left atrial appendage ligation, chronic systolic HF, HTN, s/p bioprosthetic MVR with 4v CABG who presented with increasing dyspnea and LE edema.  He most notable complaint is her R foot pain.  Assessment & Plan    ACUTE ON CHRONIC SYSTOLIC HF:   Her weight is about the same - not sure how the weight goes up despite being net negative.  Edema improved. The ultimate plan is a BiV device at [facility] in April.      Ankle swelling has improved.  She was placed back on oral diuretic  Did not tolerate compression stockings with her painful foot. Can resume once foot pain is treated  CKD STAGE IV:    Creat slowly improved with diuresis. Now on by mouth  diuretics  PERMANENT ATRIAL FIB:  On anticoagulation.  NSR on tele.  Supratherapeutic INR.  Management per pharmacy.   HTN:  BP is somewhat low on current meds - however seem to be that way during her prior cardiology clinic visit with her primary cardiologist.   No change in meds.   CAD: No evidence of ongoing chest pain.  No further ischemia work up.    FOOT PAIN:  Likely gout - elevated uric acid..  Still painful.  She does not want to use steroids -- will add colchicine & continue PRN Norco.   - Appreciate input from Dr. Marlou Palmer orthopedics. Plan for now will be Norco plus colchicine - 1 week of colchicine 0.6 twice a day twice a day and roughly 10 days of Norco  Hopefully d/c home today.  She will need to establish follow-up with Dr. Sheppard Palmer in order to be seen prior to her planned BiVICD.  Signed, Jacqueline Hew, MD  08/15/2016, 8:30 AM

## 2016-08-16 NOTE — Telephone Encounter (Signed)
Patient contacted regarding discharge from [facility] on 08/15/2016.  Patient understands to follow up with provider Cecilie Kicks, PA-C on 08/24/2016 at 1000 at [facility] office.  Patient understands discharge instructions? Yes  Patient understands medications and regiment? Yes  Patient understands to bring all medications to this visit? Yes  Patient states she is doing well, having some nausea this am, but otherwise doing well. She voiced understanding and agreed with above plan.

## 2016-08-24 ENCOUNTER — Encounter: Payer: Self-pay | Admitting: Cardiology

## 2016-08-24 ENCOUNTER — Ambulatory Visit (INDEPENDENT_AMBULATORY_CARE_PROVIDER_SITE_OTHER): Payer: Medicare Other | Admitting: Cardiology

## 2016-08-24 VITALS — BP 122/96 | HR 100 | Ht 68.0 in | Wt 176.1 lb

## 2016-08-24 DIAGNOSIS — N289 Disorder of kidney and ureter, unspecified: Secondary | ICD-10-CM

## 2016-08-24 DIAGNOSIS — I255 Ischemic cardiomyopathy: Secondary | ICD-10-CM

## 2016-08-24 DIAGNOSIS — I5022 Chronic systolic (congestive) heart failure: Secondary | ICD-10-CM

## 2016-08-24 DIAGNOSIS — M1000 Idiopathic gout, unspecified site: Secondary | ICD-10-CM | POA: Diagnosis not present

## 2016-08-24 DIAGNOSIS — E039 Hypothyroidism, unspecified: Secondary | ICD-10-CM | POA: Diagnosis not present

## 2016-08-24 DIAGNOSIS — I2589 Other forms of chronic ischemic heart disease: Secondary | ICD-10-CM

## 2016-08-24 DIAGNOSIS — I482 Chronic atrial fibrillation, unspecified: Secondary | ICD-10-CM

## 2016-08-24 LAB — BASIC METABOLIC PANEL
BUN/Creatinine Ratio: 10 — ABNORMAL LOW (ref 12–28)
BUN: 25 mg/dL (ref 8–27)
CO2: 22 mmol/L (ref 18–29)
Calcium: 9.2 mg/dL (ref 8.7–10.3)
Chloride: 98 mmol/L (ref 96–106)
Creatinine, Ser: 2.41 mg/dL — ABNORMAL HIGH (ref 0.57–1.00)
GFR calc Af Amer: 23 mL/min/{1.73_m2} — ABNORMAL LOW (ref >59)
GFR calc non Af Amer: 20 mL/min/{1.73_m2} — ABNORMAL LOW (ref >59)
Glucose: 73 mg/dL (ref 65–99)
Potassium: 3.8 mmol/L (ref 3.5–5.2)
Sodium: 140 mmol/L (ref 134–144)

## 2016-08-24 NOTE — Progress Notes (Signed)
Cardiology Office Note   Date:  08/24/2016   ID:  Jacqueline Palmer, DOB Sep 23, 1945, MRN [id number]  PCP:  No PCP Per Patient  Cardiologist:  Dr. Irish Lack   Here but back to [facility] with Dr. Sheppard Coil.   Chief Complaint  Patient presents with  . Hospitalization Follow-up      History of Present Illness: Jacqueline Palmer is a 71 y.o. female who presents for hospital follow up for acute on chronic systolic HF.   PMH of rheumatic heart disease,  permanent afib s/p ablation and MAZE with left atrial appendage ligation, chronic systolic HF, HTN, s/p bioprosthetic MVR with 4v CABG who presented with increasing dyspnea and LE edema.  She is to have BiV PPM with AV node ablation at [place] she has seen Dr. Glennon Mac there for EP.  Marland Kitchen   She had a recent echo at [facility] showing her EF to be 20%. on admit to [facility] she was initially diuresed with IV Lasix. Her renal function was followed closely during her hospital stay. Her baseline creatinine is a little greater than 2. Her BUN and creatinine was checked daily and peaked at 26/2.72 on 03/24. She diuresed.  Wt at discharge 175.    She is compliant with a low sodium diet and daily weights though her daily weights are not set up yet.  Her Aldactone was held on admission. We will continue to hold it .  She also was treated for gout.  Given colchicine for 7 days Norco for pain.  Her a fib continues. Rate remains around 100.   She is on coumadin for anticoagulation.   Her TSH was elevated at 21.073.  We have asked her to have followed with Dr. Sheppard Coil or PCP--we gave her a list of names today for PCP her in [place].      Today she is stable but SOB only with exertion.  She has not had to prop up at night.  Her edema of her legs has returned.  She feels it in her abd and thighs as well.  She has not yet had torsemide this am.  Her wt is lower than hospital as she wore her coat and shoes to weigh.  Her gout has improved.  No chest pain.  She is to be seen at [facility] next week for  pre-op plan for BiV.  She denies any chest pain.   Past Medical History:  Diagnosis Date  . Acute kidney injury ([place])   . Atrial fibrillation ([place])   . Cardiomyopathy ([place])   . CHF (congestive heart failure) ([place])   . Chronic a-fib ([place]) 08/14/2016  . Chronic anticoagulation 08/14/2016  . Coronary artery disease    4v CABG 6/17  . Hypertension   . 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 Thyroid disease     Past Surgical History:  Procedure Laterality Date  . ABDOMINAL HYSTERECTOMY    . CORONARY ARTERY BYPASS GRAFT       Current Outpatient Prescriptions  Medication Sig Dispense Refill  . amiodarone (PACERONE) 200 MG tablet Take 200 mg by mouth daily.    Marland Kitchen aspirin 81 MG chewable tablet Chew 81 mg by mouth daily.    . bisacodyl (DULCOLAX) 5 MG EC tablet Take 1-2 tablets (5-10 mg total) by mouth daily as needed for moderate constipation. 30 tablet 0  . colchicine 0.6 MG tablet Take 1 tablet (0.6 mg total) by mouth 2 (two) times daily. 14 tablet 0  . docusate  sodium (COLACE) 100 MG capsule Take 1 capsule (100 mg total) by mouth daily as needed for mild constipation or moderate constipation. 30 capsule 0  . hydrALAZINE (APRESOLINE) 10 MG tablet Take 10 mg by mouth 3 (three) times daily.    Marland Kitchen levothyroxine (SYNTHROID, LEVOTHROID) 25 MCG tablet Take 25 mcg by mouth daily before breakfast.    . metoprolol succinate (TOPROL-XL) 50 MG 24 hr tablet Take 75 mg by mouth daily. Take with or immediately following a meal.    . potassium chloride (K-DUR,KLOR-CON) 10 MEQ tablet Take 10 mEq by mouth daily.    . pravastatin (PRAVACHOL) 20 MG tablet Take 20 mg by mouth daily.    Marland Kitchen torsemide (DEMADEX) 20 MG tablet Take 40 mg by mouth daily.    Marland Kitchen warfarin (COUMADIN) 5 MG tablet Take 1 tablet (5 mg total) by mouth See admin instructions. 2.5mg  Mon, Tues Thurs, Sat and 5mg  Wed, Fri, Sunday 30 tablet 1   No current facility-administered medications for this visit.       Allergies:   Ace inhibitors and Codeine    Social History:  The patient  reports that she quit smoking about 2 years ago. Her smoking use included Cigarettes. She has never used smokeless tobacco. She reports that she does not drink alcohol or use drugs.   Family History:  The patient's family history includes Heart attack in her father, mother, and sister.    ROS:  General:no colds or fevers, no weight changes Skin:no rashes or ulcers HEENT:no blurred vision, no congestion CV:see HPI PUL:see HPI GI:no diarrhea constipation or melena, no indigestion GU:no hematuria, no dysuria MS:no joint pain, no claudication Neuro:no syncope, no lightheadedness Endo:no diabetes, + thyroid disease  Wt Readings from Last 3 Encounters:  08/24/16 176 lb 1.9 oz (79.9 kg)  08/15/16 175 lb 3.2 oz (79.5 kg)     PHYSICAL EXAM: VS:  BP (!) 122/96   Pulse 100   Ht 5\' 8"  (1.727 m)   Wt 176 lb 1.9 oz (79.9 kg)   SpO2 99%   BMI 26.78 kg/m  , BMI Body mass index is 26.78 kg/m. General:Pleasant affect, NAD Skin:Warm and dry, brisk capillary refill HEENT:normocephalic, sclera clear, mucus membranes moist Neck:supple, no JVD, no bruits  Heart:irreg irreg without murmur, gallup, rub or click Lungs:clear without rales, rhonchi, or wheezes OMB:TDHR, non tender, + BS, do not palpate liver spleen or masses Ext:2+ lower ext edema, 1+ pedal pulses, 2+ radial pulses Neuro:alert and oriented X 3, MAE, follows commands, + facial symmetry    EKG:  EKG is ordered today. The ekg ordered today demonstrates a fib with LBBB. No acute changes.  Recent Labs: 08/09/2016: B Natriuretic Peptide 1,827.3; TSH 21.073 08/14/2016: BUN 26; Creatinine, Ser 2.01; Potassium 4.2; Sodium 135 08/15/2016: Hemoglobin 11.4; Platelets 264    Lipid Panel No results found for: CHOL, TRIG, HDL, CHOLHDL, VLDL, LDLCALC, LDLDIRECT     Other studies Reviewed: Additional studies/ records that were reviewed today include: office  notes .   ASSESSMENT AND PLAN:  1. Acute on chronic systolic HF, overall improved but edema is returning she will take an extra torsemide today.  We will do BMP today as well.  Follow up at [facility] when scheduled.  2. ICM with EF 20% - plan for BiVICD in near future.  3. A fib- plan for av node ablation with Dr. Glennon Mac.  4.  Gout improved with colchicine  5. Hypothyroid elevated TSH with hospitalization, Cards team preferred PCP management.  Pt will follow with [name] prn, she is primarily pt at [facility] for cardiology.     Current medicines are reviewed with the patient today.  The patient Has no concerns regarding medicines.  The following changes have been made:  See above Labs/ tests ordered today include:see above  Disposition:   FU:  see above  Signed, Cecilie Kicks, NP  08/24/2016 9:39 AM    [address], [place], [address] [place], [place] Phone: [phone]; Fax: [phone]

## 2016-08-24 NOTE — Patient Instructions (Signed)
Medication Instructions:  1. TODAY ONLY TAKE 60 MG OF TORSEMIDE; AFTER TODAY YOU WILL RESUME TORSEMIDE 40 MG DAILY  Labwork: 1. TODAY BMET  Testing/Procedures: NONE ORDERED  Follow-Up: AS NEEDED  Any Other Special Instructions Will Be Listed Below (If Applicable).  YOU HAVE BEEN GIVEN A LIST OF PRIMARY CARE PHYSICIANS   If you need a refill on your cardiac medications before your next appointment, please call your pharmacy.

## 2016-11-29 ENCOUNTER — Telehealth: Payer: Self-pay | Admitting: *Deleted

## 2016-11-29 NOTE — Telephone Encounter (Signed)
Received Missed Visit Note, forwarded to provider/SLS 07/11

## 2017-06-20 ENCOUNTER — Other Ambulatory Visit: Payer: Self-pay | Admitting: Physician Assistant

## 2017-06-20 DIAGNOSIS — E2839 Other primary ovarian failure: Secondary | ICD-10-CM

## 2017-06-20 DIAGNOSIS — Z1231 Encounter for screening mammogram for malignant neoplasm of breast: Secondary | ICD-10-CM

## 2017-08-03 LAB — HM COLONOSCOPY

## 2018-01-30 ENCOUNTER — Encounter (HOSPITAL_COMMUNITY): Payer: Self-pay | Admitting: Emergency Medicine

## 2018-01-30 ENCOUNTER — Emergency Department (HOSPITAL_COMMUNITY): Payer: Medicare Other

## 2018-01-30 ENCOUNTER — Emergency Department (HOSPITAL_COMMUNITY)
Admission: EM | Admit: 2018-01-30 | Discharge: 2018-01-30 | Disposition: A | Discharge status: Home / Self Care | Payer: Medicare Other | Attending: Emergency Medicine | Admitting: Emergency Medicine

## 2018-01-30 DIAGNOSIS — Z79899 Other long term (current) drug therapy: Secondary | ICD-10-CM | POA: Diagnosis not present

## 2018-01-30 DIAGNOSIS — M25562 Pain in left knee: Secondary | ICD-10-CM

## 2018-01-30 DIAGNOSIS — Z87891 Personal history of nicotine dependence: Secondary | ICD-10-CM | POA: Insufficient documentation

## 2018-01-30 DIAGNOSIS — Z951 Presence of aortocoronary bypass graft: Secondary | ICD-10-CM | POA: Diagnosis not present

## 2018-01-30 DIAGNOSIS — I110 Hypertensive heart disease with heart failure: Secondary | ICD-10-CM | POA: Diagnosis not present

## 2018-01-30 DIAGNOSIS — Z7982 Long term (current) use of aspirin: Secondary | ICD-10-CM | POA: Diagnosis not present

## 2018-01-30 DIAGNOSIS — I5023 Acute on chronic systolic (congestive) heart failure: Secondary | ICD-10-CM | POA: Diagnosis not present

## 2018-01-30 DIAGNOSIS — Z7901 Long term (current) use of anticoagulants: Secondary | ICD-10-CM | POA: Insufficient documentation

## 2018-01-30 LAB — I-STAT TROPONIN, ED: Troponin i, poc: 0.03 ng/mL (ref 0.00–0.08)

## 2018-01-30 IMAGING — DX DG KNEE COMPLETE 4+V*L*
4 series · 4 of 4 positions shown · non-contrast
Comparison: None.

CLINICAL DATA: Status post feeling twisting sensation of the left
knee with left knee pain.

EXAM:
LEFT KNEE - COMPLETE 4+ VIEW

[knee ap]
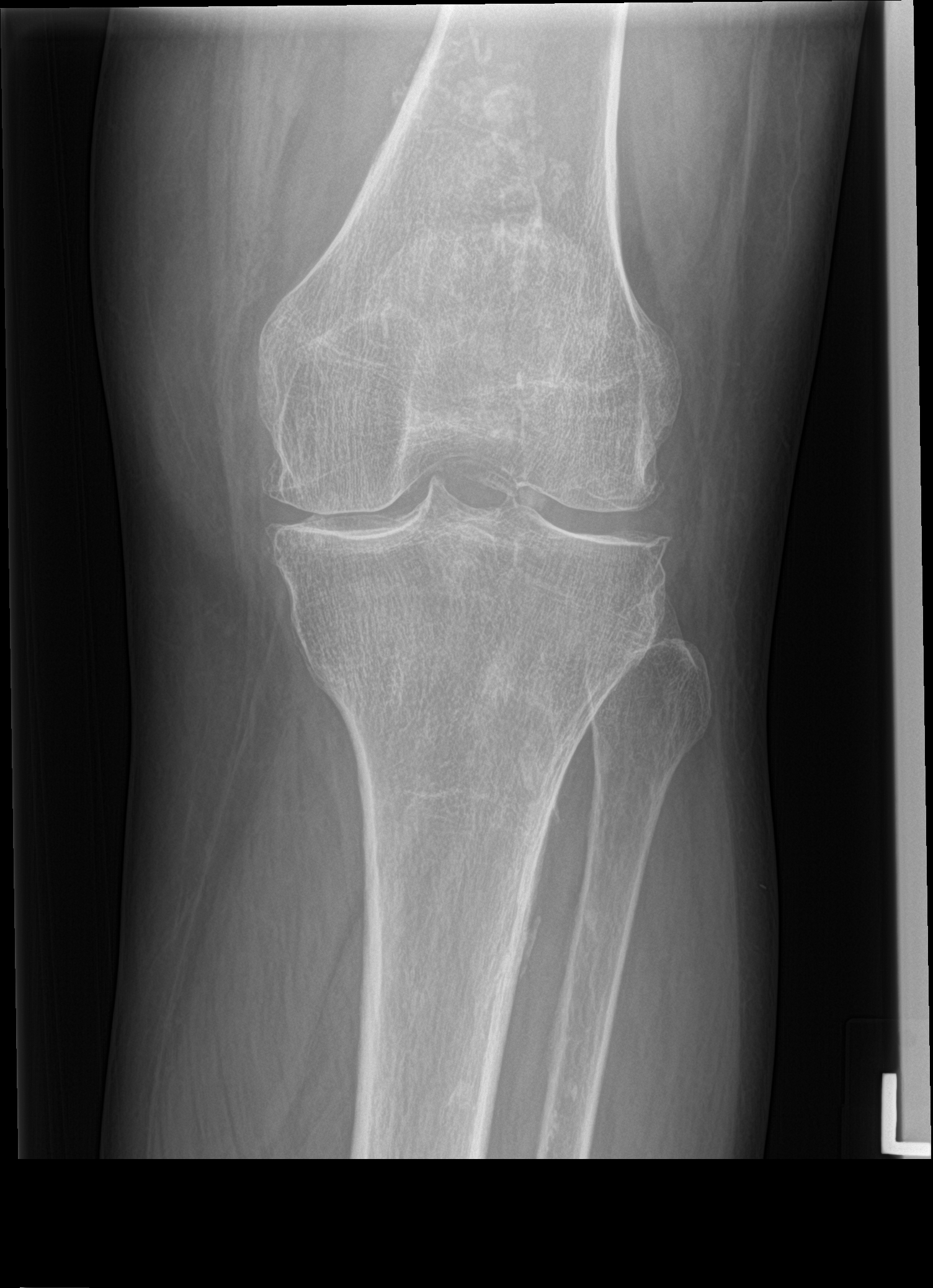

[knee lat]
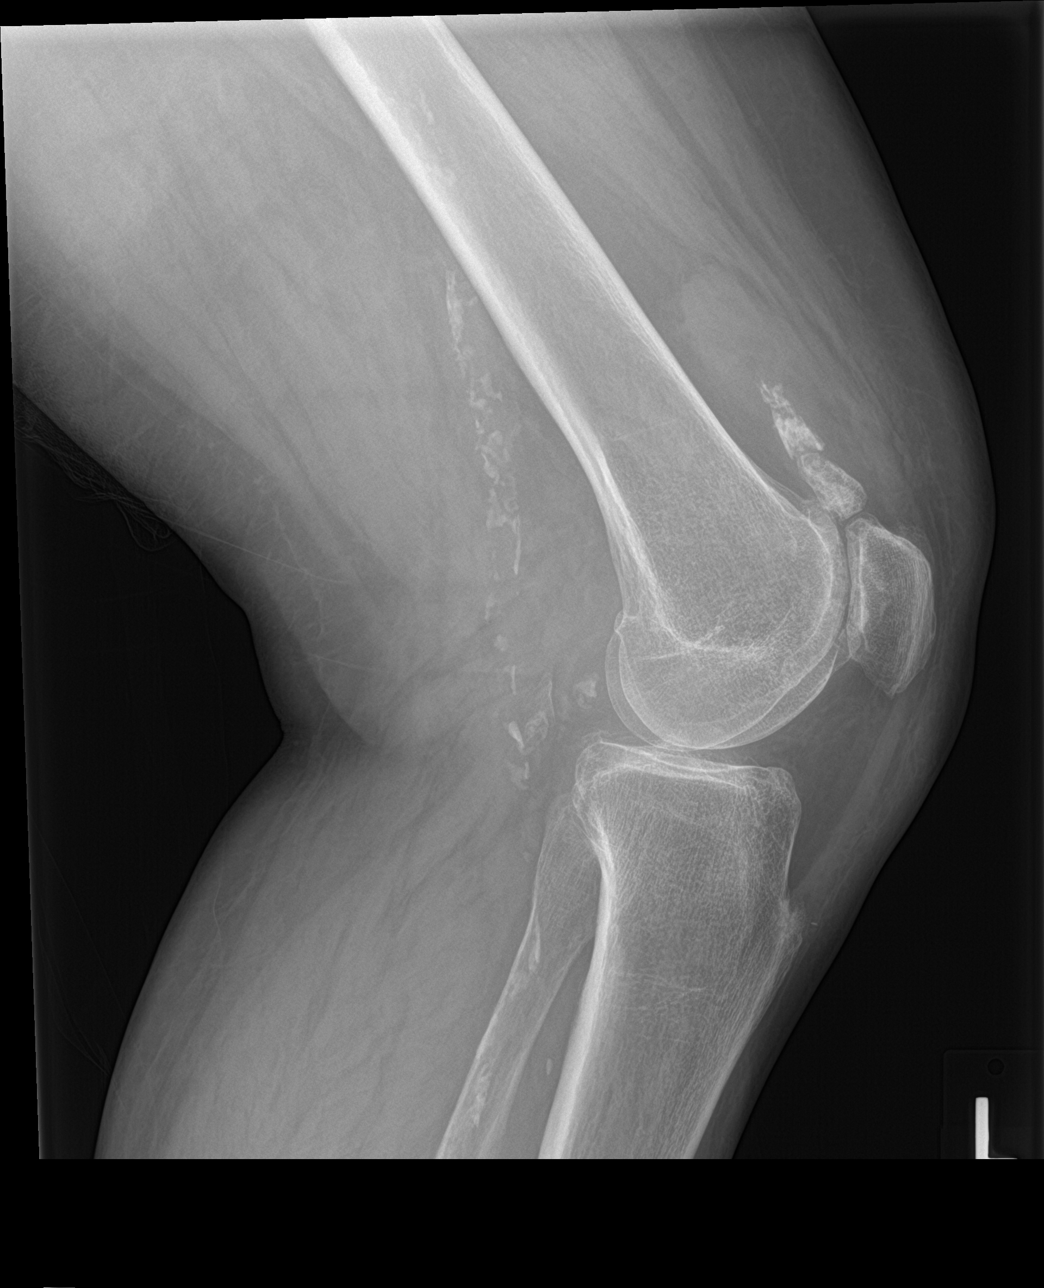

[knee obl (1 of 2)]
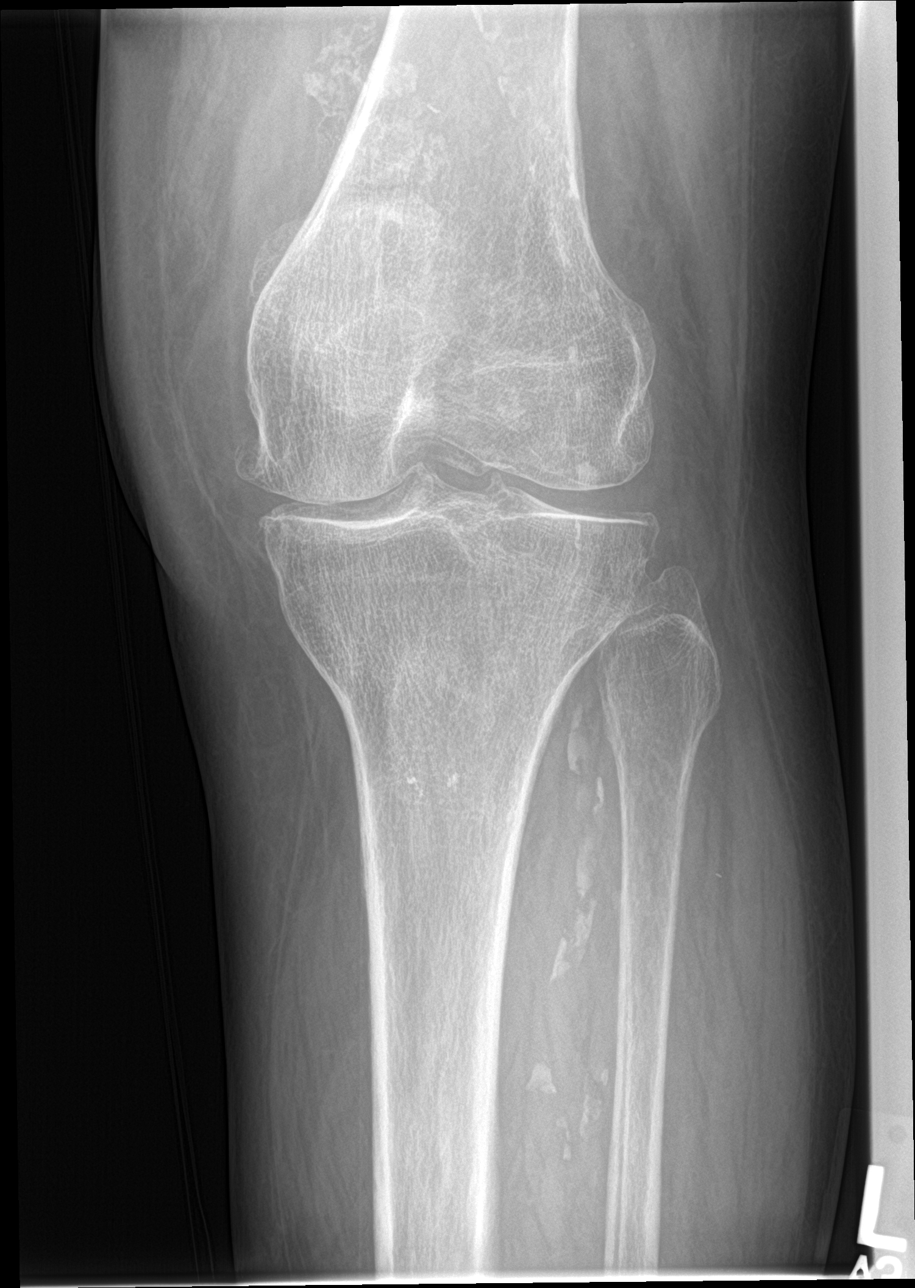

[knee obl (2 of 2)]
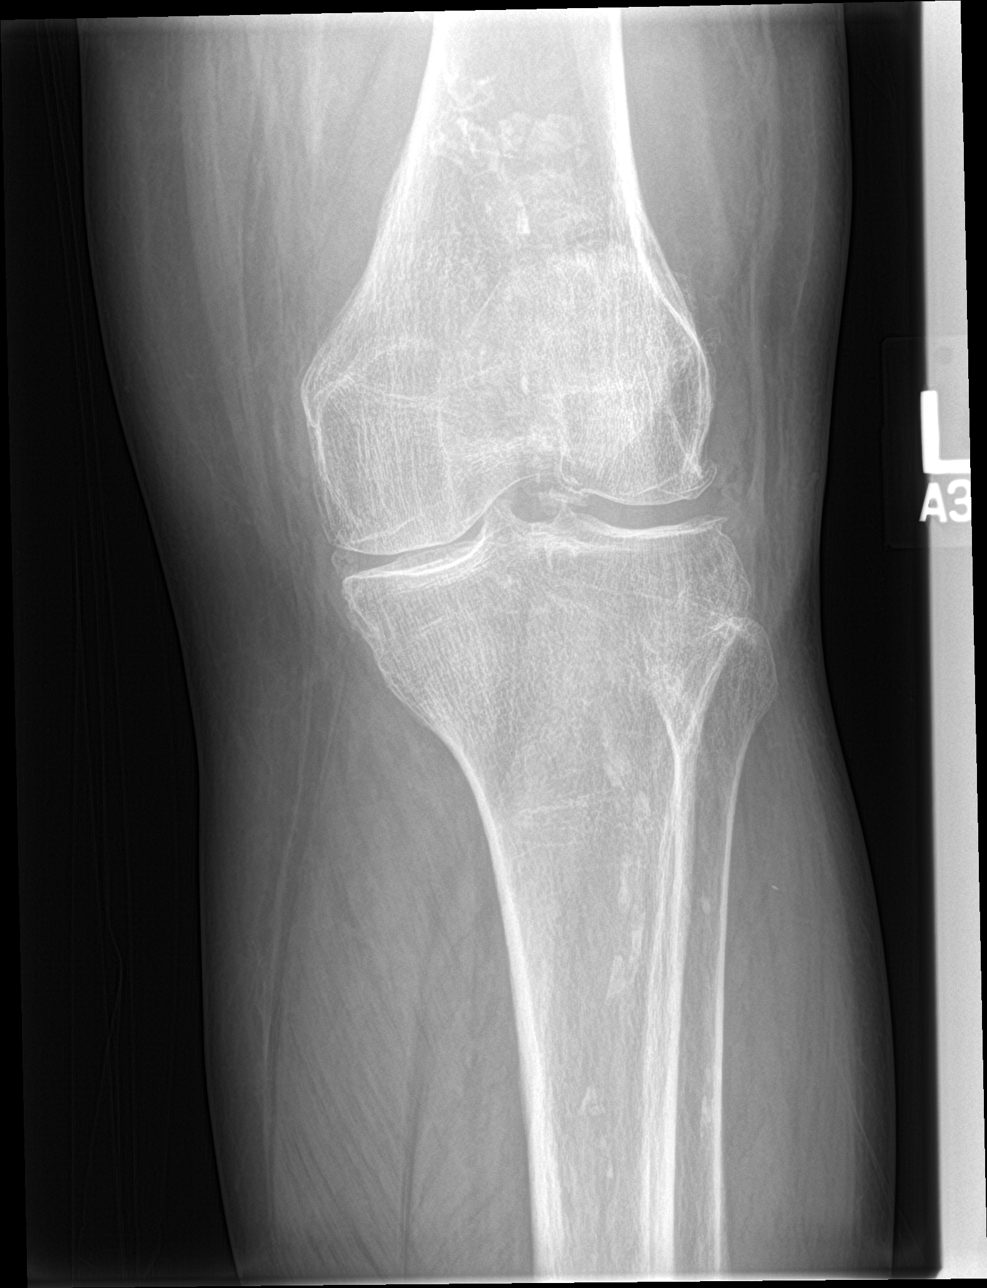

[4 of 4 positions shown; findings below may reference images not displayed]

FINDINGS: No evidence of fracture, dislocation. There are degenerative joint
changes of LIBBE knee with narrowed joint space and osteophyte
formation and suprapatellar effusion. Calcified bodies are
identified in the suprapatellar space. Soft tissues are
unremarkable.
IMPRESSION: No acute fracture or dislocation identified. Osteoarthritic changes
of left knee.

## 2018-01-30 NOTE — ED Notes (Signed)
Patient transported to X-ray 

## 2018-01-30 NOTE — ED Notes (Signed)
Patient verbalizes understanding of discharge instructions. Opportunity for questioning and answers were provided. Armband removed by staff, pt discharged from ED in wheelchair.  

## 2018-01-30 NOTE — ED Notes (Signed)
Spoke to APP about patient and her complaints.  Denies SOB, denies CP.  Wants to be seen for knee.  Will wait for EDP to see patient in back to place their preferred orders.

## 2018-01-30 NOTE — ED Notes (Signed)
RN contacted Xray to inquire when Pt's xray will be completed. Per Tech, Pt should be transported within the hr

## 2018-01-30 NOTE — ED Triage Notes (Signed)
Pt presents to ED for assessment after being seen by [place] [facility] where she was seen today for left knee pain after she felt a "pop" in her knee when rolling over in bed.  Patient is unable to bear weight without a strong increase in pain, swelling noted.  Xray of the knee was done (patient has disc with her) and was told they noted fluid collecting around her knee.  Pt has a hx of CHF, and was sent here for further work-up due to swelling in both legs noted and concerns for fluid retention.

## 2018-01-30 NOTE — ED Provider Notes (Signed)
[facility] EMERGENCY DEPARTMENT Provider Note   CSN: [id number] Arrival date & time: 01/30/18  1148     History   Chief Complaint Chief Complaint  Patient presents with  . Knee Pain    HPI Jacqueline Palmer is a 72 y.o. female.  The history is provided by the patient.  Knee Pain   This is a new problem. The current episode started more than 2 days ago. The problem occurs constantly. The problem has been gradually improving. The pain is present in the left knee. The quality of the pain is described as aching. The pain is mild. Pertinent negatives include no numbness, full range of motion, no stiffness, no tingling and no itching. Associated symptoms comments: swelling. The symptoms are aggravated by activity. She has tried OTC pain medications for the symptoms. The treatment provided mild relief. There has been a history of trauma (Patient heard a pop while twisting her knee several days ago, has been walking with cane. ). Family history is significant for gout.    Past Medical History:  Diagnosis Date  . Acute kidney injury ([place])   . Atrial fibrillation ([place])   . Cardiomyopathy ([place])   . CHF (congestive heart failure) ([place])   . Chronic a-fib ([place]) 08/14/2016  . Chronic anticoagulation 08/14/2016  . Coronary artery disease    4v CABG 6/17  . Hypertension   . 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 Thyroid disease     Patient Active Problem List   Diagnosis Date Noted  . Gout attack 08/14/2016  . Chronic a-fib ([place]) 08/14/2016  . Chronic anticoagulation 08/14/2016  . Acute on chronic systolic heart failure ([place]) 08/09/2016    Past Surgical History:  Procedure Laterality Date  . ABDOMINAL HYSTERECTOMY    . CORONARY ARTERY BYPASS GRAFT       OB History   None      Home Medications    Prior to Admission medications   Medication Sig Start Date End Date Taking? Authorizing Provider  amiodarone (PACERONE) 200 MG  tablet Take 200 mg by mouth daily.    [provider]  aspirin 81 MG chewable tablet Chew 81 mg by mouth daily.    [provider]  bisacodyl (DULCOLAX) 5 MG EC tablet Take 1-2 tablets (5-10 mg total) by mouth daily as needed for moderate constipation. 08/14/16   Barrett, Evelene Croon, PA-C  colchicine 0.6 MG tablet Take 1 tablet (0.6 mg total) by mouth 2 (two) times daily. 08/15/16   Cheryln Manly, NP  docusate sodium (COLACE) 100 MG capsule Take 1 capsule (100 mg total) by mouth daily as needed for mild constipation or moderate constipation. 08/14/16   Barrett, Evelene Croon, PA-C  hydrALAZINE (APRESOLINE) 10 MG tablet Take 10 mg by mouth 3 (three) times daily.    [provider]  levothyroxine (SYNTHROID, LEVOTHROID) 25 MCG tablet Take 25 mcg by mouth daily before breakfast.    [provider]  metoprolol succinate (TOPROL-XL) 50 MG 24 hr tablet Take 75 mg by mouth daily. Take with or immediately following a meal.    [provider]  potassium chloride (K-DUR,KLOR-CON) 10 MEQ tablet Take 10 mEq by mouth daily.    [provider]  pravastatin (PRAVACHOL) 20 MG tablet Take 20 mg by mouth daily.    [provider]  torsemide (DEMADEX) 20 MG tablet Take 40 mg by mouth daily.    [provider]  warfarin (COUMADIN) 5  MG tablet Take 1 tablet (5 mg total) by mouth See admin instructions. 2.5mg  Mon, Tues Thurs, Sat and 5mg  Wed, Fri, Sunday 08/14/16   Barrett, Evelene Croon, PA-C    Family History Family History  Problem Relation Age of Onset  . Heart attack Mother   . Heart attack Father   . Heart attack Sister     Social History Social History   Tobacco Use  . Smoking status: Former Smoker    Types: Cigarettes    Last attempt to quit: 07/30/2014    Years since quitting: 3.5  . Smokeless tobacco: Never Used  Substance Use Topics  . Alcohol use: No  . Drug use: No     Allergies   Ace inhibitors and Codeine   Review of  Systems Review of Systems  Constitutional: Negative for chills and fever.  HENT: Negative for ear pain and sore throat.   Eyes: Negative for pain and visual disturbance.  Respiratory: Negative for cough and shortness of breath.   Cardiovascular: Negative for chest pain and palpitations.  Gastrointestinal: Negative for abdominal pain and vomiting.  Genitourinary: Negative for dysuria and hematuria.  Musculoskeletal: Positive for arthralgias, gait problem and joint swelling. Negative for back pain and stiffness.  Skin: Negative for color change, itching and rash.  Neurological: Negative for tingling, seizures, syncope and numbness.  All other systems reviewed and are negative.    Physical Exam Updated Vital Signs  ED Triage Vitals [01/30/18 1204]  Enc Vitals Group     BP (!) 152/91     Pulse Rate 74     Resp 16     Temp 98 F (36.7 C)     Temp Source Oral     SpO2 98 %     Weight      Height      Head Circumference      Peak Flow      Pain Score 7     Pain Loc      Pain Edu?      Excl. in [place]?     Physical Exam  Constitutional: She appears well-developed and well-nourished. No distress.  HENT:  Head: Normocephalic and atraumatic.  Eyes: Pupils are equal, round, and reactive to light. Conjunctivae are normal.  Neck: Neck supple.  Cardiovascular: Normal rate and regular rhythm.  No murmur heard. Pulmonary/Chest: Effort normal and breath sounds normal. No respiratory distress.  Abdominal: Soft. There is no tenderness.  Musculoskeletal: Normal range of motion. She exhibits edema. She exhibits no tenderness or deformity.  Patient with swelling around the left knee, she is not tender to palpation in this area, normal range of motion of the left lower extremity without any pain, no warmth  Neurological: She is alert.  Skin: Skin is warm and dry.  Psychiatric: She has a normal mood and affect.  Nursing note and vitals reviewed.    ED Treatments / Results  Labs (all  labs ordered are listed, but only abnormal results are displayed) Labs Reviewed  I-STAT TROPONIN, ED    EKG None  Radiology Dg Knee Complete 4 Views Left  Result Date: 01/30/2018 CLINICAL DATA:  Status post feeling twisting sensation of the left knee with left knee pain. EXAM: LEFT KNEE - COMPLETE 4+ VIEW COMPARISON:  None. FINDINGS: No evidence of fracture, dislocation. There are degenerative joint changes of land knee with narrowed joint space and osteophyte formation and suprapatellar effusion. Calcified bodies are identified in the suprapatellar space. Soft tissues are unremarkable. IMPRESSION:  No acute fracture or dislocation identified. Osteoarthritic changes of left knee. Electronically Signed   By: Abelardo Diesel M.D.   On: 01/30/2018 15:34    Procedures Procedures (including critical care time)  Medications Ordered in ED Medications - No data to display   Initial Impression / Assessment and Plan / ED Course  I have reviewed the triage vital signs and the nursing notes.  Pertinent labs & imaging results that were available during my care of the patient were reviewed by me and considered in my medical decision making (see chart for details).     Chieko Neises is a 72 year old female with history of CAD, hypertension, atrial fibrillation on Coumadin who presents to the ED with left knee pain.  Patient with normal vitals.  No fever.  Patient felt a pop in her left knee 3 days ago and had some swelling and pain that has gradually improved.  She had x-rays done 3 days ago that were overall unremarkable.  Patient wanted basically a second opinion today and came for evaluation.  She denies any worsening pain.  She is able to ambulate well with a cane.  She has full range of motion of the knee with no pain.  Has some mild swelling around her knee.  She does have a history of gout but the knee is not warm.  It is not tender.  She specifically states that this does not feel like her gout.   She has never had gout in her knee.  No concern for septic arthritis as patient has no fever, no pain with range of motion.  No overlying signs of cellulitis.  Given the history of trauma suspect that patient likely with traumatic effusion.  She is on blood thinners and could have possible hematoma as well.  Repeat x-ray showed no acute fracture or malalignment.  Overall no major joint effusion, just some suprapatellar effusion.  Patient with arthritic changes as well.  Suspect this is likely from arthritis/trauma and recommend continued use of Tylenol for pain, elevation of the limb, ambulation with cane, compression.  Recommend follow-up with primary care doctor.  Troponin was ordered in first look process and unaware why as patient does not have any cardiac complaints.  It was normal but no need for any further work-up from a cardiac standpoint.  Patient discharged in good condition.  This chart was dictated using voice recognition software.  Despite best efforts to proofread,  errors can occur which can change the documentation meaning.   Final Clinical Impressions(s) / ED Diagnoses   Final diagnoses:  Acute pain of left knee    ED Discharge Orders    None       Lennice Sites, DO 01/30/18 1556

## 2018-07-04 ENCOUNTER — Ambulatory Visit
Admission: RE | Admit: 2018-07-04 | Discharge: 2018-07-04 | Disposition: A | Discharge status: Home / Self Care | Payer: Medicare Other | Source: Ambulatory Visit | Attending: Physician Assistant | Admitting: Physician Assistant

## 2018-07-04 ENCOUNTER — Other Ambulatory Visit: Payer: Self-pay | Admitting: Physician Assistant

## 2018-07-04 DIAGNOSIS — Z780 Asymptomatic menopausal state: Secondary | ICD-10-CM

## 2018-07-04 DIAGNOSIS — Z1231 Encounter for screening mammogram for malignant neoplasm of breast: Secondary | ICD-10-CM

## 2018-07-04 LAB — HM DEXA SCAN

## 2018-07-04 IMAGING — MG DIGITAL SCREENING BILATERAL MAMMOGRAM WITH TOMO AND CAD
8 series · 8 of 24 positions shown · non-contrast
Comparison: Previous exam(s).

CLINICAL DATA: Screening.

EXAM:
DIGITAL SCREENING BILATERAL MAMMOGRAM WITH TOMO AND CAD

[R MLO synth-2D]
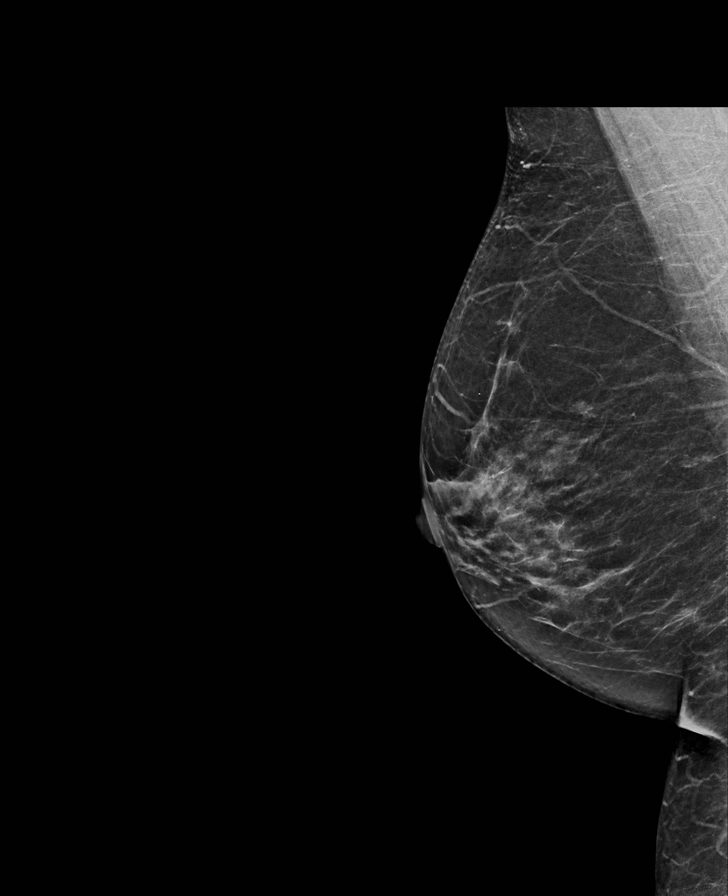

[R CC synth-2D]
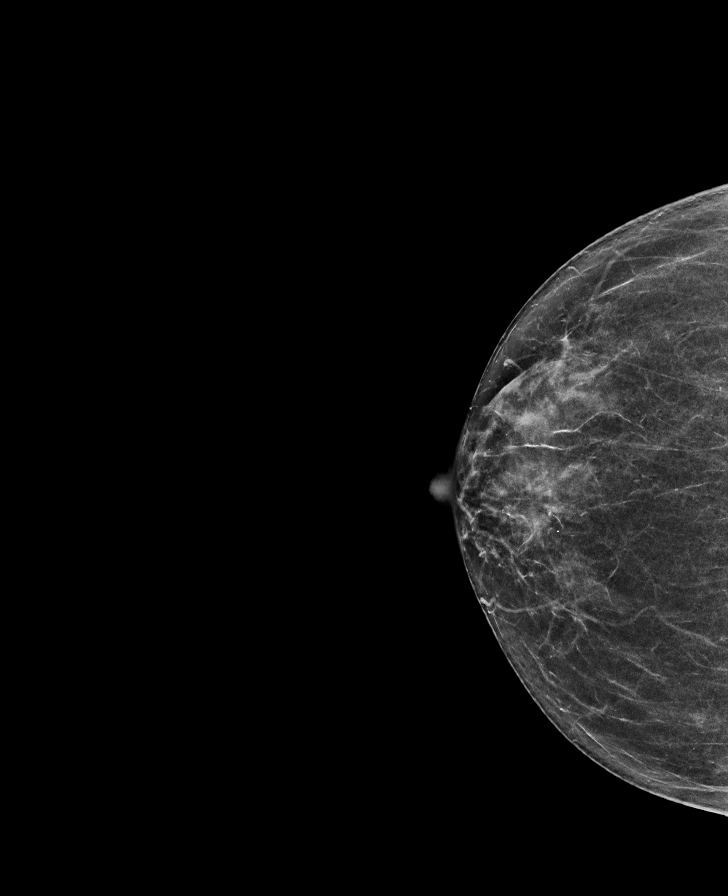

[L MLO synth-2D]
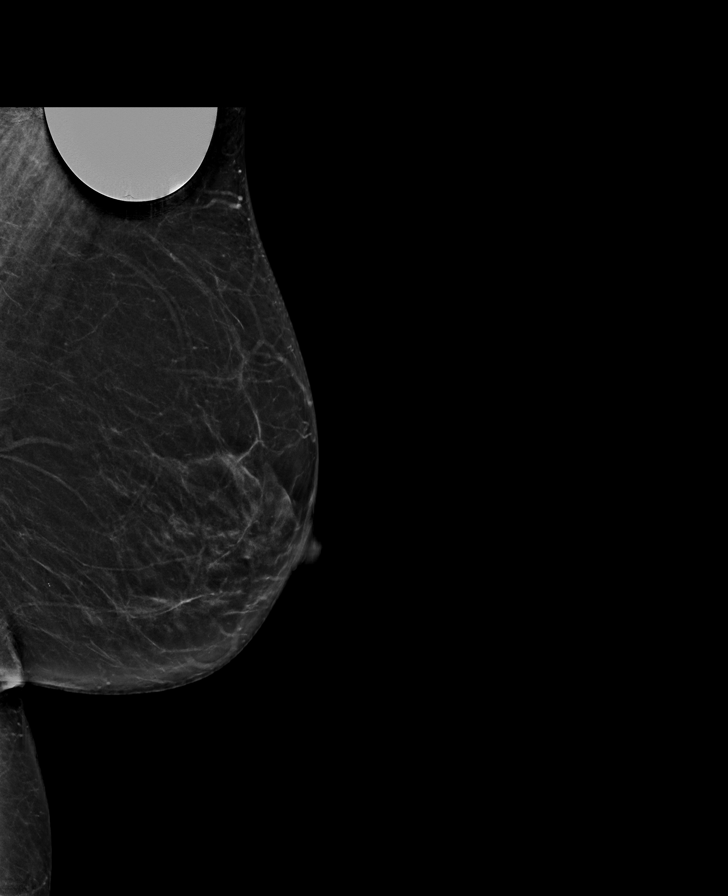

[L CC synth-2D]
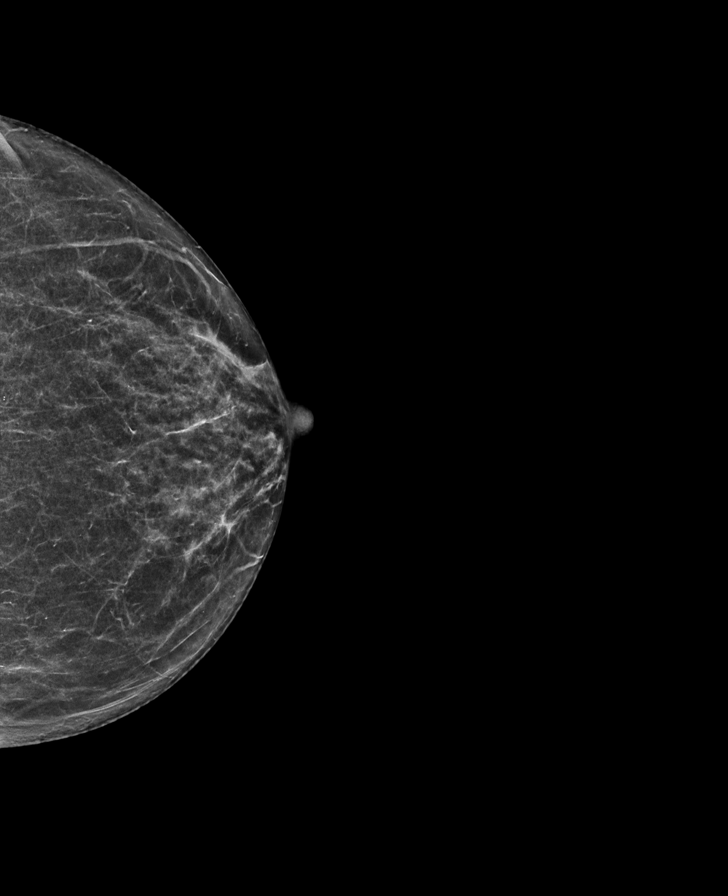

[R CC tomo · tomo slice 32/63.0]
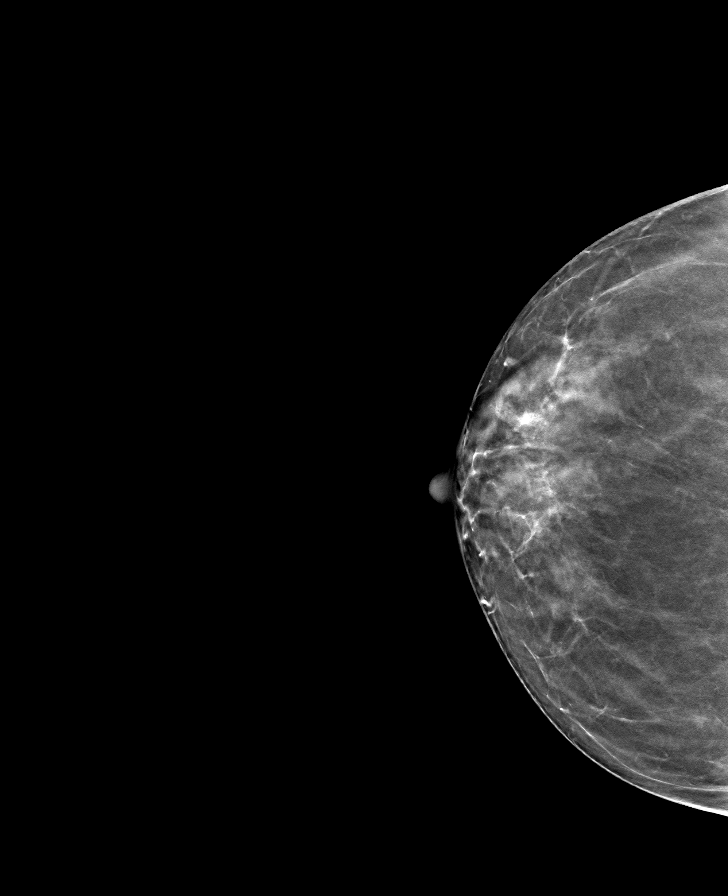

[L CC tomo · tomo slice 31/60.0]
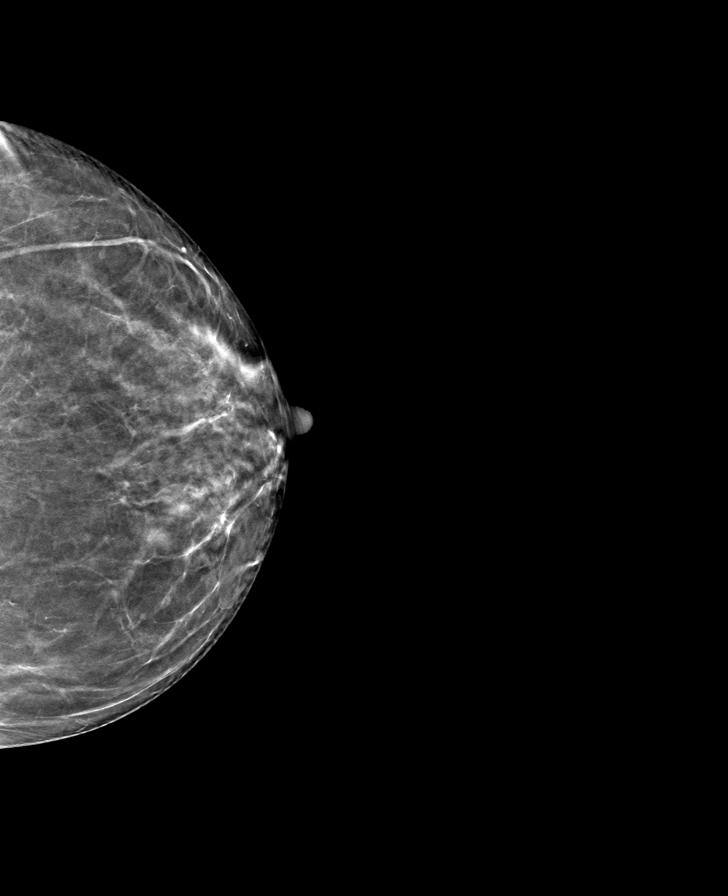

[L MLO tomo · tomo slice 33/66.0]
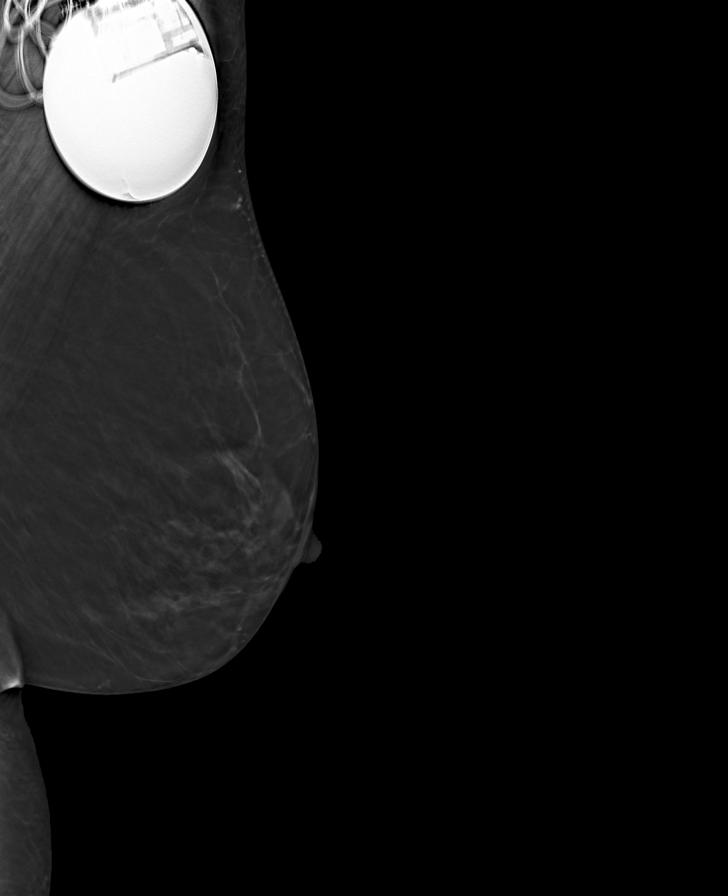

[R MLO tomo · tomo slice 35/68.0]
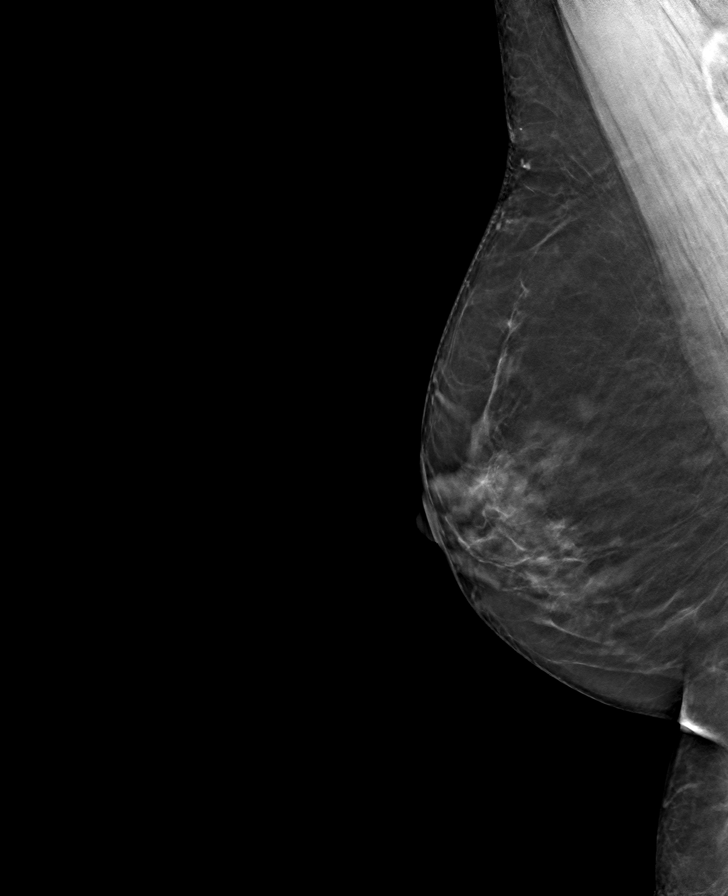

[8 of 24 positions shown; findings below may reference images not displayed]

ACR Breast Density Category b: There are scattered areas of
fibroglandular density.
FINDINGS: There are no findings suspicious for malignancy. Images were
processed with CAD.
IMPRESSION: No mammographic evidence of malignancy. A result letter of this
screening mammogram will be mailed directly to the patient.

RECOMMENDATION:
Screening mammogram in one year. (Code:[TQ])

BI-RADS CATEGORY  1: Negative.

## 2018-07-10 ENCOUNTER — Other Ambulatory Visit: Payer: Self-pay | Admitting: Physician Assistant

## 2018-07-10 DIAGNOSIS — R5381 Other malaise: Secondary | ICD-10-CM

## 2019-07-30 HISTORY — PX: CRANIOTOMY: SHX93

## 2019-10-16 HISTORY — PX: NEPHRECTOMY: SHX65

## 2019-12-26 ENCOUNTER — Telehealth: Payer: Self-pay

## 2019-12-26 NOTE — Telephone Encounter (Signed)
NOTES [place] [phone], SENT REFERRAL TO [place]

## 2019-12-30 ENCOUNTER — Telehealth: Payer: Self-pay | Admitting: Oncology

## 2019-12-30 ENCOUNTER — Telehealth: Payer: Self-pay

## 2019-12-30 NOTE — Telephone Encounter (Signed)
Received a call from Ms. Jacqueline Palmer to transfer her care from [facility] for renal cell carcinoma. She recently moved to [facility] from [place]. Pt has been scheduled to see Dr. Alen Blew on 8/19 at 11am. I cld and provided the appt date and time. She's aware to arrive 15 minutes early. I provided the address and location to our facility.

## 2019-12-30 NOTE — Telephone Encounter (Signed)
NOTES [place] [phone], SENT REFERRAL TO [place]

## 2020-01-08 ENCOUNTER — Other Ambulatory Visit: Payer: Self-pay

## 2020-01-08 ENCOUNTER — Inpatient Hospital Stay: Payer: Medicare Other | Attending: Oncology | Admitting: Oncology

## 2020-01-08 VITALS — BP 141/96 | HR 76 | Temp 97.7°F | Wt 190.4 lb

## 2020-01-08 DIAGNOSIS — R918 Other nonspecific abnormal finding of lung field: Secondary | ICD-10-CM

## 2020-01-08 DIAGNOSIS — N2889 Other specified disorders of kidney and ureter: Secondary | ICD-10-CM

## 2020-01-08 DIAGNOSIS — I4891 Unspecified atrial fibrillation: Secondary | ICD-10-CM | POA: Diagnosis not present

## 2020-01-08 DIAGNOSIS — C641 Malignant neoplasm of right kidney, except renal pelvis: Secondary | ICD-10-CM | POA: Diagnosis present

## 2020-01-08 DIAGNOSIS — Z79899 Other long term (current) drug therapy: Secondary | ICD-10-CM | POA: Diagnosis not present

## 2020-01-08 DIAGNOSIS — G9389 Other specified disorders of brain: Secondary | ICD-10-CM | POA: Diagnosis not present

## 2020-01-08 DIAGNOSIS — Z7901 Long term (current) use of anticoagulants: Secondary | ICD-10-CM | POA: Insufficient documentation

## 2020-01-08 DIAGNOSIS — I110 Hypertensive heart disease with heart failure: Secondary | ICD-10-CM | POA: Diagnosis not present

## 2020-01-08 DIAGNOSIS — C7931 Secondary malignant neoplasm of brain: Secondary | ICD-10-CM | POA: Diagnosis not present

## 2020-01-08 DIAGNOSIS — I509 Heart failure, unspecified: Secondary | ICD-10-CM | POA: Diagnosis not present

## 2020-01-08 DIAGNOSIS — C649 Malignant neoplasm of unspecified kidney, except renal pelvis: Secondary | ICD-10-CM | POA: Diagnosis not present

## 2020-01-08 DIAGNOSIS — Z905 Acquired absence of kidney: Secondary | ICD-10-CM | POA: Diagnosis not present

## 2020-01-08 DIAGNOSIS — Z951 Presence of aortocoronary bypass graft: Secondary | ICD-10-CM | POA: Insufficient documentation

## 2020-01-08 NOTE — Progress Notes (Signed)
Reason for the request:    Kidney cancer  HPI: I was asked by Dr. Tasia Catchings to evaluate Jacqueline Palmer for the diagnosis of kidney cancer.  She is a 74 year old woman with history of coronary disease, atrial fibrillation as well as chronic renal insufficiency who was found to have a 7 cm solid right kidney mass in November 2020.  She was under evaluation to have surgical resection but was delayed till about January 2021 where she had evaluation at [facility].  She has subsequently developed seizure and found to have a right frontal metastasis.  She presented with new onset seizure and slurred speech at [facility] on July 24, 2019.  On March 11 of 2021 she underwent right frontal craniotomy and resection.  The pathology showed metastatic disease consistent with renal cell carcinoma.  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  She has subsequently received radiation therapy after craniotomy.  She subsequently underwent radical nephrectomy completed on Oct 16, 2019 with a final pathology showed renal cell carcinoma without any specific subtype.  Her histological grade was grade 3 and the tumor measuring 9 cm extending into the perinephric tissue with a final pathological staging [id number].  This was performed by Dr. Thomos Lemons at [facility].  She has recovered reasonably well from surgery and follow-up imaging studies in October 31, 2019 did not show any evidence of metastatic disease.  Last MRI of the brain was obtained on Sep 29, 2019 which showed redemonstration of a right frontoparietal craniotomy with decrease edema and no evidence of metastatic disease.  She relocated to this area and was referred to [facility] to establish care regarding her oncology ongoing treatment and care.  She is currently living in a senior living facility independently.  She denies any recent issues including no  nausea, abdominal pain or altered mental status.  She denies any headaches, seizures or slurred speech.  She denies any weight loss or appetite changes.  She has resumed all activities of daily living.  He does not report any headaches, blurry vision, syncope or seizures. Does not report any fevers, chills or sweats.  Does not report any cough, wheezing or hemoptysis.  Does not report any chest pain, palpitation, orthopnea or leg edema.  Does not report any nausea, vomiting or abdominal pain.  Does not report any constipation or diarrhea.  Does not report any skeletal complaints.    Does not report frequency, urgency or hematuria.  Does not report any skin rashes or lesions. Does not report any heat or cold intolerance.  Does not report any lymphadenopathy or petechiae.  Does not report any anxiety or depression.  Remaining review of systems is negative.    Past Medical History:  Diagnosis Date  . Acute kidney injury ([place])   . Atrial fibrillation ([place])   . Cardiomyopathy ([place])   . CHF (congestive heart failure) ([place])   . Chronic a-fib 08/14/2016  . Chronic anticoagulation 08/14/2016  . Coronary artery disease    4v CABG 6/17  . Hypertension   . 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 Thyroid disease   :  Past Surgical History:  Procedure Laterality Date  . ABDOMINAL HYSTERECTOMY    . CORONARY ARTERY BYPASS GRAFT    :   Current Outpatient Medications:  .  amiodarone (PACERONE) 200 MG tablet, Take 200 mg by mouth daily., Disp: , Rfl:  .  aspirin 81 MG chewable tablet, Chew 81 mg by mouth daily., Disp: , Rfl:  .  bisacodyl (DULCOLAX) 5 MG EC tablet, Take 1-2 tablets (5-10 mg total) by mouth daily as needed for moderate constipation., Disp: 30 tablet, Rfl: 0 .  colchicine 0.6 MG tablet, Take 1 tablet (0.6 mg total) by mouth 2 (two) times daily., Disp: 14 tablet, Rfl: 0 .  docusate sodium (COLACE) 100 MG capsule, Take 1 capsule (100 mg total) by mouth  daily as needed for mild constipation or moderate constipation., Disp: 30 capsule, Rfl: 0 .  hydrALAZINE (APRESOLINE) 10 MG tablet, Take 10 mg by mouth 3 (three) times daily., Disp: , Rfl:  .  levothyroxine (SYNTHROID, LEVOTHROID) 25 MCG tablet, Take 25 mcg by mouth daily before breakfast., Disp: , Rfl:  .  metoprolol succinate (TOPROL-XL) 50 MG 24 hr tablet, Take 75 mg by mouth daily. Take with or immediately following a meal., Disp: , Rfl:  .  potassium chloride (K-DUR,KLOR-CON) 10 MEQ tablet, Take 10 mEq by mouth daily., Disp: , Rfl:  .  pravastatin (PRAVACHOL) 20 MG tablet, Take 20 mg by mouth daily., Disp: , Rfl:  .  torsemide (DEMADEX) 20 MG tablet, Take 40 mg by mouth daily., Disp: , Rfl:  .  warfarin (COUMADIN) 5 MG tablet, Take 1 tablet (5 mg total) by mouth See admin instructions. 2.39m Mon, Tues Thurs, Sat and 561mWed, Fri, Sunday, Disp: 30 tablet, Rfl: 1:  Allergies  Allergen Reactions  . Ace Inhibitors Cough  . Codeine Other (See Comments)    Nausea and Feels Jittery  :  Family History  Problem Relation Age of Onset  . Heart attack Mother   . Heart attack Father   . Heart attack Sister   . Breast cancer Neg Hx   :  Social History   Socioeconomic History  . Marital status: Widowed    Spouse name: Not on file  . Number of children: Not on file  . Years of education: Not on file  . Highest education level: Not on file  Occupational History  . Not on file  Tobacco Use  . Smoking status: Former Smoker    Types: Cigarettes    Quit date: 07/30/2014    Years since quitting: 5.4  . Smokeless tobacco: Never Used  Substance and Sexual Activity  . Alcohol use: No  . Drug use: No  . Sexual activity: Not on file  Other Topics Concern  . Not on file  Social History Narrative  . Not on file   Social Determinants of Health   Financial Resource Strain:   . Difficulty of Paying Living Expenses: Not on file  Food Insecurity:   . Worried About [employer] fundraisern the  Last Year: Not on file  . Ran Out of Food in the Last Year: Not on file  Transportation Needs:   . Lack of Transportation (Medical): Not on file  . Lack of Transportation (Non-Medical): Not on file  Physical Activity:   . Days of Exercise per Week: Not on file  . Minutes of Exercise per Session: Not on file  Stress:   . Feeling of Stress : Not on file  Social Connections:   . Frequency of Communication with Friends and Family: Not on file  . Frequency of Social Gatherings with Friends and Family: Not on file  . Attends Religious Services: Not on file  . Active Member of Clubs or Organizations: Not on file  . Attends Club or  Organization Meetings: Not on file  . Marital Status: Not on file  Intimate Partner Violence:   . Fear of Current or Ex-Partner: Not on file  . Emotionally Abused: Not on file  . Physically Abused: Not on file  . Sexually Abused: Not on file  :  Pertinent items are noted in HPI.  Exam: Blood pressure (!) 141/96, pulse 76, temperature 97.7 F (36.5 C), temperature source Oral, weight 190 lb 6.4 oz (86.4 kg), SpO2 99 %.   ECOG 1 General appearance: alert and cooperative appeared without distress. Head: atraumatic without any abnormalities. Eyes: conjunctivae/corneas clear. PERRL.  Sclera anicteric. Throat: lips, mucosa, and tongue normal; without oral thrush or ulcers. Resp: clear to auscultation bilaterally without rhonchi, wheezes or dullness to percussion. Cardio: regular rate and rhythm, S1, S2 normal, no murmur, click, rub or gallop GI: soft, non-tender; bowel sounds normal; no masses,  no organomegaly Skin: Skin color, texture, turgor normal. No rashes or lesions Lymph nodes: Cervical, supraclavicular, and axillary nodes normal. Neurologic: Grossly normal without any motor, sensory or deep tendon reflexes. Musculoskeletal: No joint deformity or effusion.   Assessment and Plan:   74 year old woman with:  1.  Stage IV renal cell carcinoma  currently without any active disease after surgical resection of an isolated brain metastasis and radical nephrectomy completed in May 2021.  She initially presented with 7 cm right kidney mass in November 2020.  She has received surgical resection at [facility] and relocated to this area and has not received any systemic therapy.  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  Last imaging studies done of the brain in May 2021 which showed no evidence of disease.  Imaging studies of the chest obtained on 2019-11-22 did not show any evidence of metastatic disease.   The natural course of this disease and treatment options were discussed at this time.  Given the fact that she is on no evidence of measurable metastatic disease would recommend continued active surveillance.  Systemic therapy with cabozantinib or PD-L1 inhibitor would be a possibility if she develops metastatic disease.  The plan is to update her staging work-up with imaging studies of the chest, abdomen and pelvis as well as an MRI of the brain in the near future and then will be done periodically.  Every 6 months imaging studies will be the plan unless other issues arise.  2.  Renal insufficiency: Her creatinine obtained on July 15 was 2.0 with creatinine clearance around 25 cc/min.  We have discussed avoiding nephrotoxic agents and potentially follow-up with nephrology.  3.  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  She is no longer on any antiseizure medication.  4.  Pain: Related to her surgical resection and improving at this time.  She does take oxycodone infrequently.  5.  Follow-up: Will be in the next 3 months unless her imaging studies are abnormal.  60  minutes were dedicated to this visit. The time was spent on reviewing laboratory data, imaging studies, discussing treatment  options,  and answering questions regarding future plan.    A copy of this consult has been forwarded to the requesting physician.

## 2020-01-09 ENCOUNTER — Telehealth: Payer: Self-pay | Admitting: Oncology

## 2020-01-09 NOTE — Telephone Encounter (Signed)
Scheduled appointments per 8/19 los. Left message for patient with appointments dates and times.

## 2020-01-15 ENCOUNTER — Other Ambulatory Visit: Payer: Medicare Other

## 2020-01-15 ENCOUNTER — Emergency Department (HOSPITAL_COMMUNITY)
Admission: EM | Admit: 2020-01-15 | Discharge: 2020-01-15 | Disposition: A | Discharge status: Home / Self Care | Payer: Medicare Other | Attending: Emergency Medicine | Admitting: Emergency Medicine

## 2020-01-15 ENCOUNTER — Other Ambulatory Visit: Payer: Self-pay

## 2020-01-15 ENCOUNTER — Emergency Department (HOSPITAL_COMMUNITY): Payer: Medicare Other

## 2020-01-15 DIAGNOSIS — Z85528 Personal history of other malignant neoplasm of kidney: Secondary | ICD-10-CM | POA: Insufficient documentation

## 2020-01-15 DIAGNOSIS — Y939 Activity, unspecified: Secondary | ICD-10-CM | POA: Diagnosis not present

## 2020-01-15 DIAGNOSIS — Z91040 Latex allergy status: Secondary | ICD-10-CM | POA: Diagnosis not present

## 2020-01-15 DIAGNOSIS — E876 Hypokalemia: Secondary | ICD-10-CM | POA: Diagnosis not present

## 2020-01-15 DIAGNOSIS — I5023 Acute on chronic systolic (congestive) heart failure: Secondary | ICD-10-CM | POA: Diagnosis not present

## 2020-01-15 DIAGNOSIS — Z951 Presence of aortocoronary bypass graft: Secondary | ICD-10-CM | POA: Diagnosis not present

## 2020-01-15 DIAGNOSIS — Z87891 Personal history of nicotine dependence: Secondary | ICD-10-CM | POA: Insufficient documentation

## 2020-01-15 DIAGNOSIS — M542 Cervicalgia: Secondary | ICD-10-CM | POA: Insufficient documentation

## 2020-01-15 DIAGNOSIS — S0993XA Unspecified injury of face, initial encounter: Secondary | ICD-10-CM | POA: Diagnosis present

## 2020-01-15 DIAGNOSIS — R519 Headache, unspecified: Secondary | ICD-10-CM | POA: Diagnosis not present

## 2020-01-15 DIAGNOSIS — R42 Dizziness and giddiness: Secondary | ICD-10-CM | POA: Insufficient documentation

## 2020-01-15 DIAGNOSIS — R569 Unspecified convulsions: Secondary | ICD-10-CM | POA: Insufficient documentation

## 2020-01-15 DIAGNOSIS — I2510 Atherosclerotic heart disease of native coronary artery without angina pectoris: Secondary | ICD-10-CM | POA: Insufficient documentation

## 2020-01-15 DIAGNOSIS — I110 Hypertensive heart disease with heart failure: Secondary | ICD-10-CM | POA: Diagnosis not present

## 2020-01-15 DIAGNOSIS — Z79899 Other long term (current) drug therapy: Secondary | ICD-10-CM | POA: Diagnosis not present

## 2020-01-15 DIAGNOSIS — X58XXXA Exposure to other specified factors, initial encounter: Secondary | ICD-10-CM | POA: Insufficient documentation

## 2020-01-15 DIAGNOSIS — Y999 Unspecified external cause status: Secondary | ICD-10-CM | POA: Insufficient documentation

## 2020-01-15 DIAGNOSIS — S00512A Abrasion of oral cavity, initial encounter: Secondary | ICD-10-CM | POA: Insufficient documentation

## 2020-01-15 DIAGNOSIS — Z7901 Long term (current) use of anticoagulants: Secondary | ICD-10-CM | POA: Diagnosis not present

## 2020-01-15 DIAGNOSIS — Y929 Unspecified place or not applicable: Secondary | ICD-10-CM | POA: Diagnosis not present

## 2020-01-15 LAB — COMPREHENSIVE METABOLIC PANEL
ALT: 14 U/L (ref 0–44)
AST: 22 U/L (ref 15–41)
Albumin: 3.8 g/dL (ref 3.5–5.0)
Alkaline Phosphatase: 69 U/L (ref 38–126)
Anion gap: 15 (ref 5–15)
BUN: 15 mg/dL (ref 8–23)
CO2: 24 mmol/L (ref 22–32)
Calcium: 9.6 mg/dL (ref 8.9–10.3)
Chloride: 103 mmol/L (ref 98–111)
Creatinine, Ser: 2 mg/dL — ABNORMAL HIGH (ref 0.44–1.00)
GFR calc Af Amer: 28 mL/min — ABNORMAL LOW (ref >60)
GFR calc non Af Amer: 24 mL/min — ABNORMAL LOW (ref >60)
Glucose, Bld: 85 mg/dL (ref 70–99)
Potassium: 3.1 mmol/L — ABNORMAL LOW (ref 3.5–5.1)
Sodium: 142 mmol/L (ref 135–145)
Total Bilirubin: 0.7 mg/dL (ref 0.3–1.2)
Total Protein: 6.5 g/dL (ref 6.5–8.1)

## 2020-01-15 LAB — CBC WITH DIFFERENTIAL/PLATELET
Abs Immature Granulocytes: 0.04 10*3/uL (ref 0.00–0.07)
Basophils Absolute: 0 10*3/uL (ref 0.0–0.1)
Basophils Relative: 1 %
Eosinophils Absolute: 0.1 10*3/uL (ref 0.0–0.5)
Eosinophils Relative: 1 %
HCT: 38.1 % (ref 36.0–46.0)
Hemoglobin: 11.7 g/dL — ABNORMAL LOW (ref 12.0–15.0)
Immature Granulocytes: 1 %
Lymphocytes Relative: 18 %
Lymphs Abs: 1.3 10*3/uL (ref 0.7–4.0)
MCH: 23.7 pg — ABNORMAL LOW (ref 26.0–34.0)
MCHC: 30.7 g/dL (ref 30.0–36.0)
MCV: 77.1 fL — ABNORMAL LOW (ref 80.0–100.0)
Monocytes Absolute: 0.8 10*3/uL (ref 0.1–1.0)
Monocytes Relative: 10 %
Neutro Abs: 5.1 10*3/uL (ref 1.7–7.7)
Neutrophils Relative %: 69 %
Platelets: 317 10*3/uL (ref 150–400)
RBC: 4.94 MIL/uL (ref 3.87–5.11)
RDW: 16.8 % — ABNORMAL HIGH (ref 11.5–15.5)
WBC: 7.3 10*3/uL (ref 4.0–10.5)
nRBC: 0 % (ref 0.0–0.2)

## 2020-01-15 LAB — URINALYSIS, ROUTINE W REFLEX MICROSCOPIC
Bilirubin Urine: NEGATIVE
Glucose, UA: NEGATIVE mg/dL
Hgb urine dipstick: NEGATIVE
Ketones, ur: NEGATIVE mg/dL
Leukocytes,Ua: NEGATIVE
Nitrite: NEGATIVE
Protein, ur: NEGATIVE mg/dL
Specific Gravity, Urine: 1.006 (ref 1.005–1.030)
pH: 6 (ref 5.0–8.0)

## 2020-01-15 LAB — PROTIME-INR
INR: 1.9 — ABNORMAL HIGH (ref 0.8–1.2)
Prothrombin Time: 20.7 seconds — ABNORMAL HIGH (ref 11.4–15.2)

## 2020-01-15 LAB — CBG MONITORING, ED: Glucose-Capillary: 83 mg/dL (ref 70–99)

## 2020-01-15 IMAGING — CT CT HEAD W/O CM
4 series · 17 of 47 positions shown, 19 images · non-contrast
Comparison: None.

CLINICAL DATA: Seizure.

EXAM:
CT HEAD WITHOUT CONTRAST
TECHNIQUE: Contiguous axial images were obtained from the base of the skull
through the vertex without intravenous contrast.

[Series 3: head wo · axial · 0.45mm/px · z∈[+1063,+1178]mm · 7 of 31 slices shown, 9 images]
[im 4/31  brain]
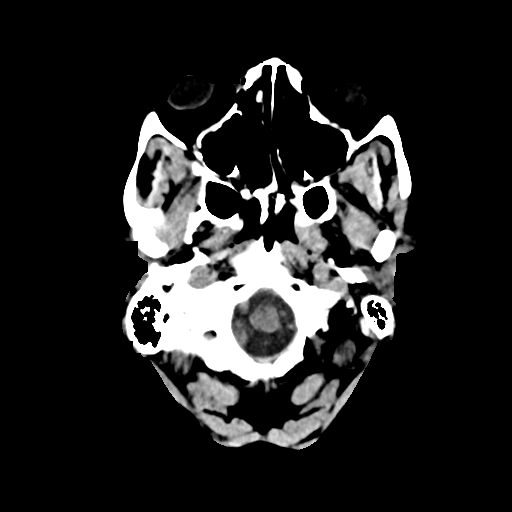
[im 4/31  bone]
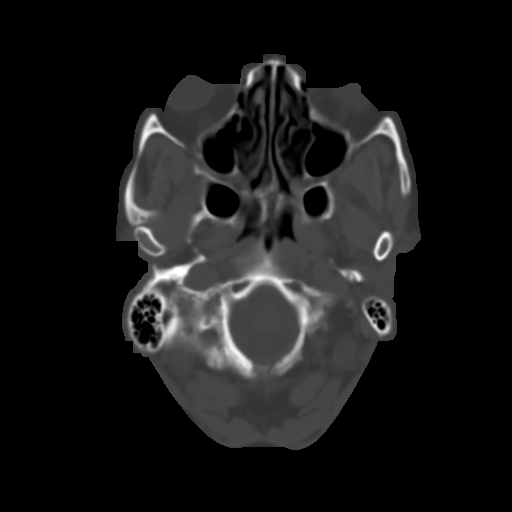
[im 8/31  brain]
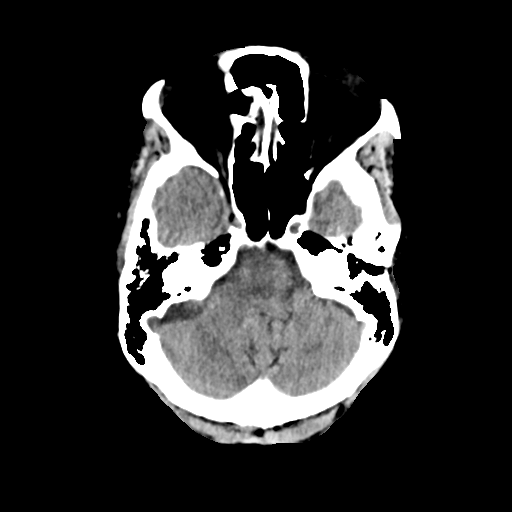
[im 12/31  brain]
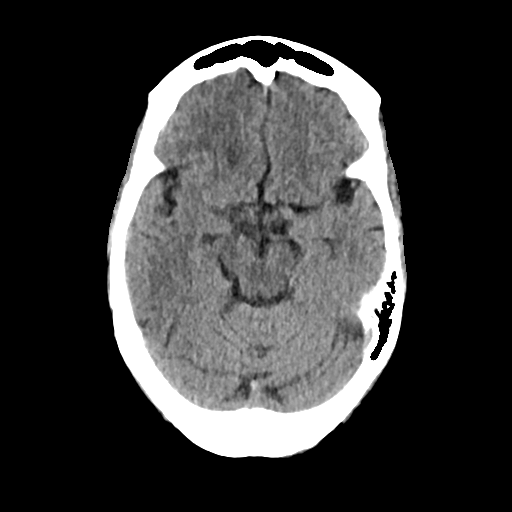
[im 16/31  brain]
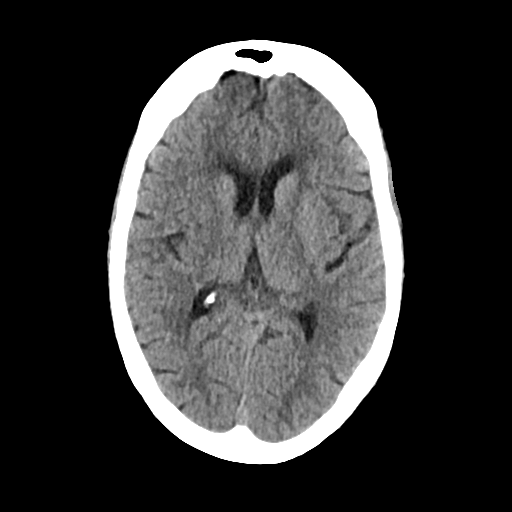
[im 19/31  brain]
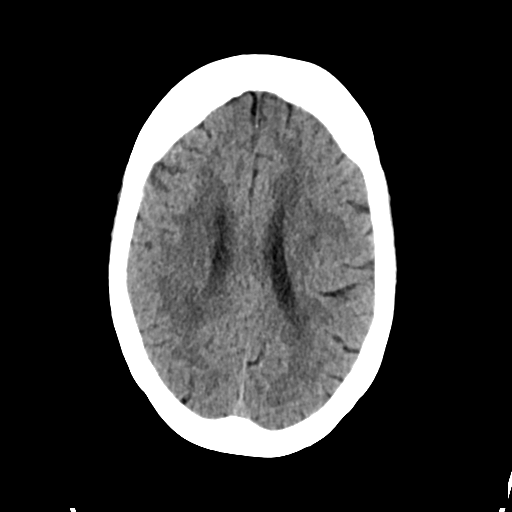
[im 19/31  bone]
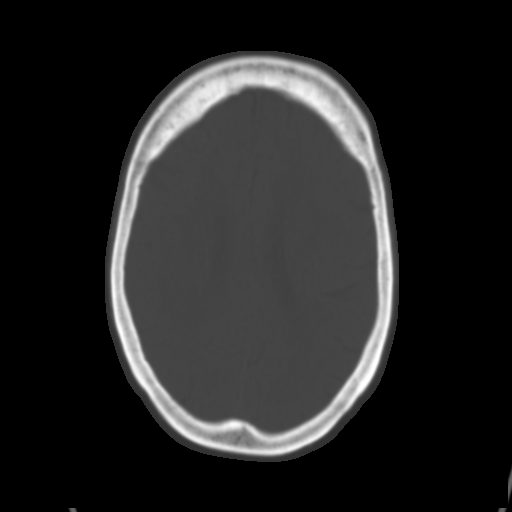
[im 23/31  brain]
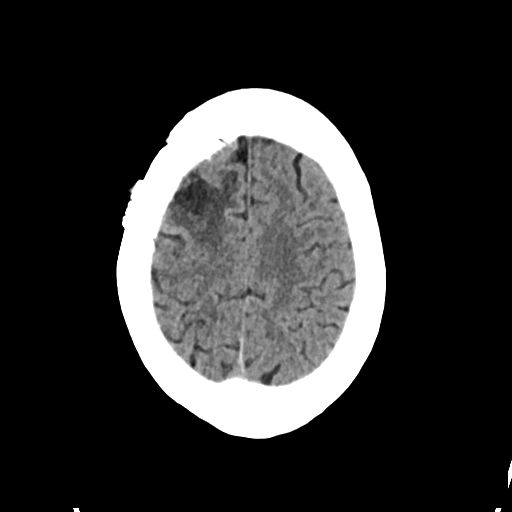
[im 27/31  brain]
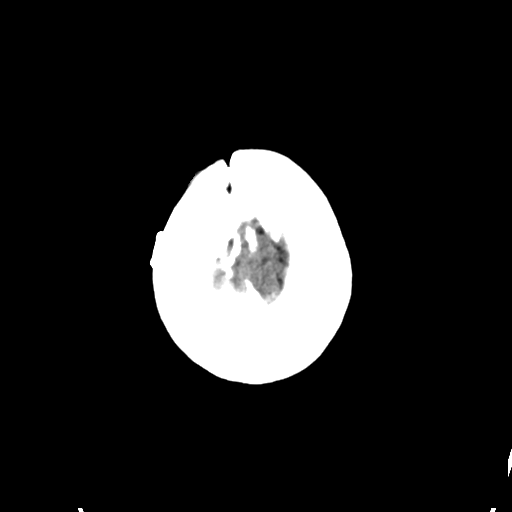

[Series 4: head bone · axial · 0.45mm/px · z∈[+1062,+1114]mm · 4 of 76 slices shown]
[im 8/76  bone]
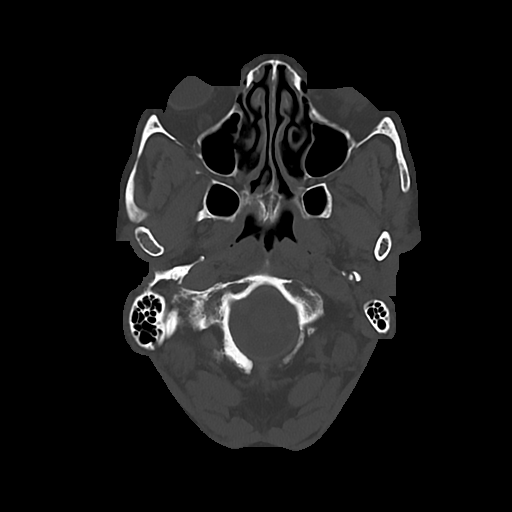
[im 16/76  bone]
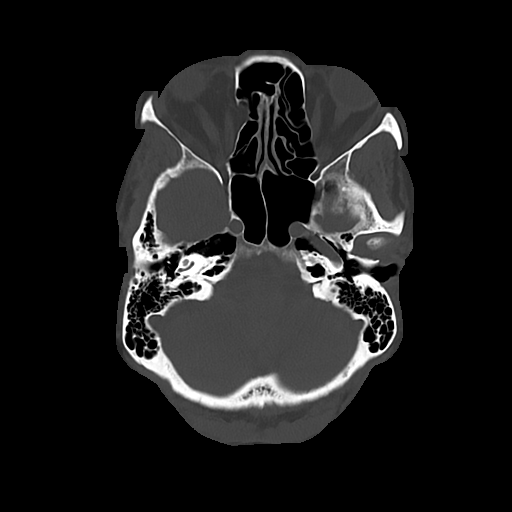
[im 23/76  bone]
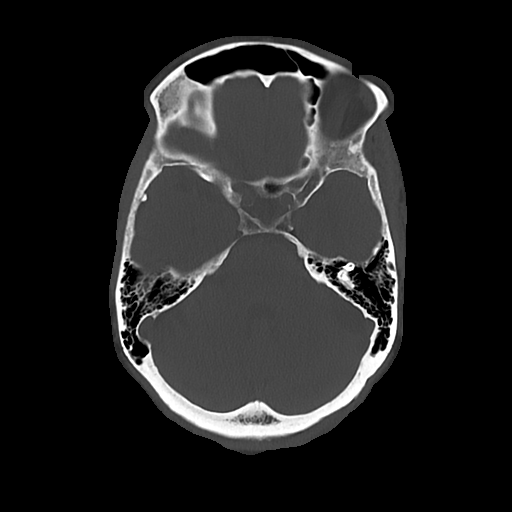
[im 34/76  bone]
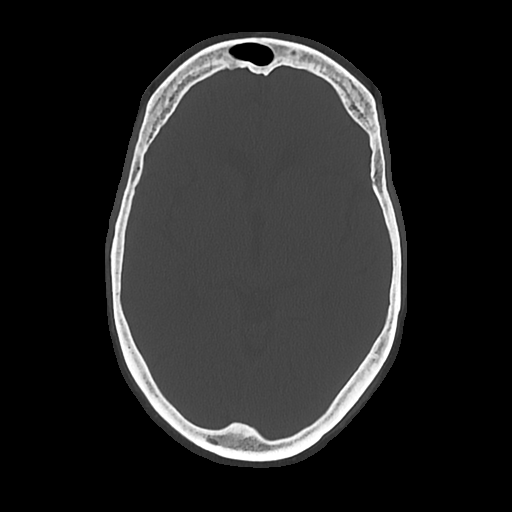

[Series 5: cor soft · coronal · 0.33mm/px · 3 of 69 slices shown]
[im 23/69  brain]
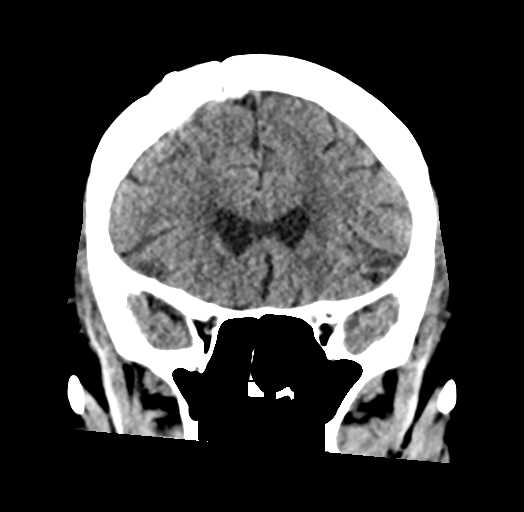
[im 31/69  brain]
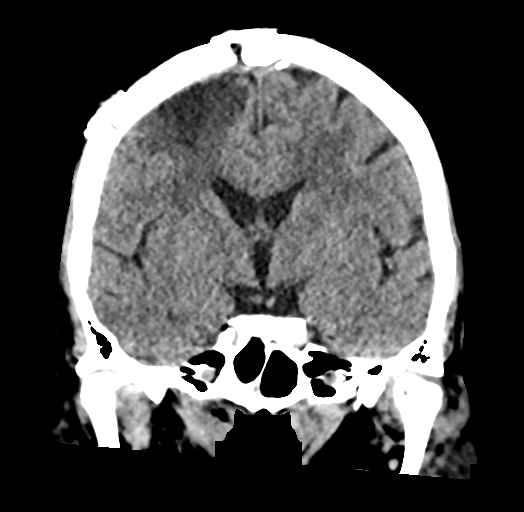
[im 38/69  brain]
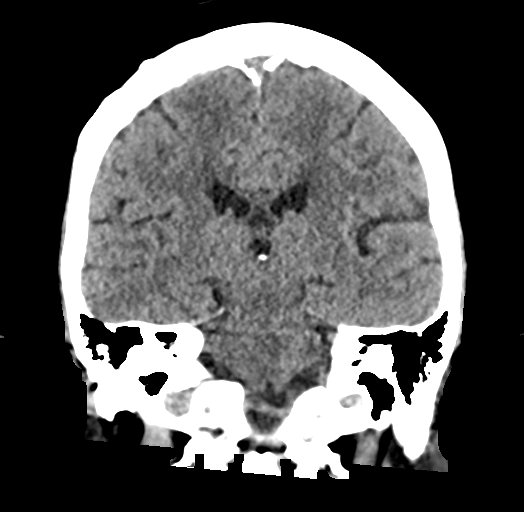

[Series 6: sag soft · sagittal · 0.33mm/px · 3 of 58 slices shown]
[im 20/58  brain]
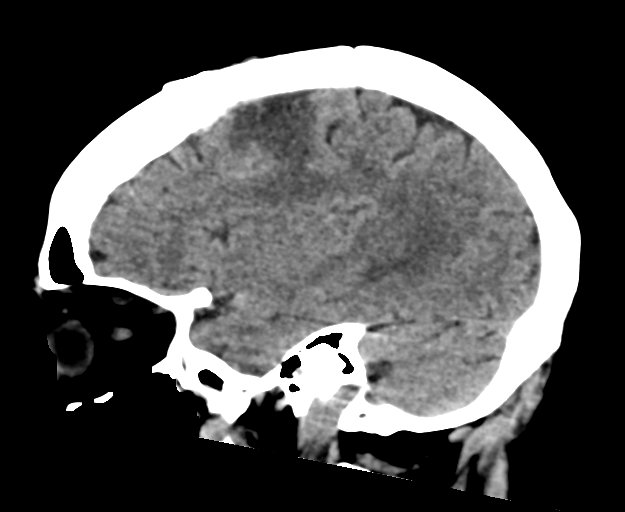
[im 29/58  brain]
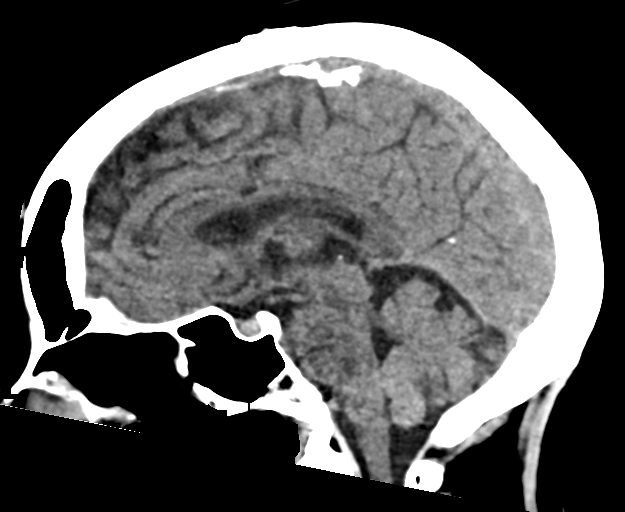
[im 39/58  brain]
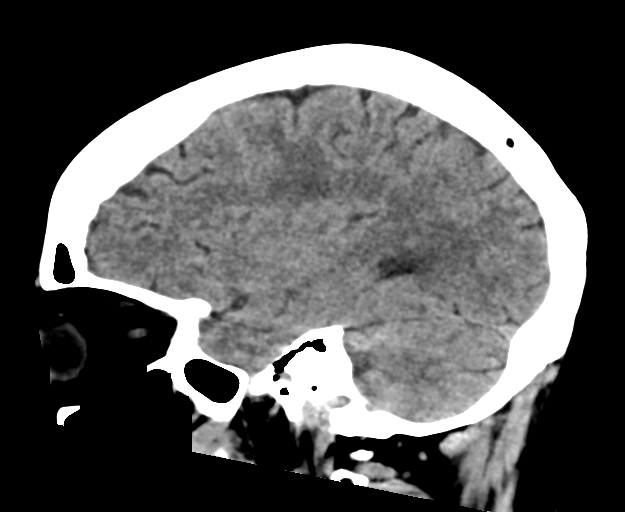

[17 of 47 positions shown; findings below may reference images not displayed]

FINDINGS: Brain: Right frontal cortical low density is noted which most likely
represents postoperative change. No definite hemorrhage is noted. No
midline shift is noted. Ventricular size is within normal limits.

Vascular: No hyperdense vessel or unexpected calcification.

Skull: Status post right frontal craniotomy. No acute osseous
abnormality is noted.

Sinuses/Orbits: No acute finding.

Other: None.
IMPRESSION: Right frontal cortical low density is noted which most likely
represents postoperative change given the presence of overlying
right frontal craniotomy; correlation with patient's history is
recommended. No definite hemorrhage is noted.

## 2020-01-15 MED ORDER — LEVETIRACETAM 500 MG PO TABS: 500.0000 mg | ORAL_TABLET | Freq: Two times a day (BID) | ORAL | 0 refills | Status: DC

## 2020-01-15 MED ORDER — POTASSIUM CHLORIDE ER 10 MEQ PO TBCR: 10.0000 meq | EXTENDED_RELEASE_TABLET | Freq: Every day | ORAL | 0 refills | Status: DC

## 2020-01-15 MED ORDER — POTASSIUM CHLORIDE CRYS ER 20 MEQ PO TBCR
20.0000 meq | EXTENDED_RELEASE_TABLET | Freq: Once | ORAL | Status: AC
  Administered 2020-01-15: 20 meq via ORAL
  Filled 2020-01-15: qty 1

## 2020-01-15 MED ORDER — LIDOCAINE 5 % EX PTCH
1.0000 | MEDICATED_PATCH | CUTANEOUS | Status: DC
  Administered 2020-01-15: 1 via TRANSDERMAL
  Filled 2020-01-15: qty 1

## 2020-01-15 MED ORDER — LEVETIRACETAM IN NACL 1000 MG/100ML IV SOLN
1000.0000 mg | Freq: Once | INTRAVENOUS | Status: AC
  Administered 2020-01-15: 1000 mg via INTRAVENOUS
  Filled 2020-01-15: qty 100

## 2020-01-15 MED ORDER — ACETAMINOPHEN 500 MG PO TABS
1000.0000 mg | ORAL_TABLET | Freq: Once | ORAL | Status: AC
  Administered 2020-01-15: 1000 mg via ORAL
  Filled 2020-01-15: qty 2

## 2020-01-15 MED ORDER — MORPHINE SULFATE (PF) 2 MG/ML IV SOLN
2.0000 mg | Freq: Once | INTRAVENOUS | Status: AC
  Administered 2020-01-15: 2 mg via INTRAVENOUS
  Filled 2020-01-15: qty 1

## 2020-01-15 NOTE — ED Notes (Signed)
Patient transported to CT 

## 2020-01-15 NOTE — ED Notes (Signed)
Defibrillator interrogated by [employer]. Results pending. Margaux PA notified. Pt also stating she has a head ache 7/10. Neck pain also developing 8/10. PA aware.

## 2020-01-15 NOTE — ED Provider Notes (Addendum)
Jacqueline Palmer   CSN: [id number] Arrival date & time: 01/15/20  1130     History Chief Complaint  Patient presents with  . Seizures    Jacqueline Palmer is a 74 y.o. female with PMHx HTN, CAD s/p CABG, Chronic A fib on Warfarin, renal cell carcinoma s/p R nephrectomy, and right frontal lobe neoplasm s/p craniotomy who presents to the ED via EMS for seizure.   Pt reports that she had a tonic clonic seizure in March of 2021 with findings of a tumor in her brain from her renal cell carcinoma. She underwent a craniotomy in [facility] and was placed on Keppra however states she was taken off 2 months ago as she did not have any other seizures since brain surgery. Pt recently moved to the area from [place] and currently living at [facility] independent living facility. She reports her caregiver came over this morning to assist her with her house duties when she began feeling dizzy. Pt reports she was able to sit down on the couch prior to her feeling a "twitching" sensation to the entire left side of her face. She states she was concerned she was having a stroke at that time however was able to move her left arm and leg. She reports she does not remember anything else until EMS arrived. Per EMS pt was found to be post ictal with trauma to her tongue and concern for seizure prompting them to bring pt to the ED. Pt currently complaining of a mild headache and neck pain; she states she will get neck pain from time to time. She denies hitting her head as she was on the couch the entire time. Pt denies fevers, chills, neck stiffness, rash, vision changes, nausea, vomiting, or any other associated symptoms.   The history is provided by the patient, medical records and the EMS personnel.       Past Medical History:  Diagnosis Date  . Acute kidney injury ([place])   . Atrial fibrillation ([place])   . Cardiomyopathy ([place])   . CHF (congestive heart failure) ([place])    . Chronic a-fib 08/14/2016  . Chronic anticoagulation 08/14/2016  . Coronary artery disease    4v CABG 6/17  . Hypertension   . 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 Thyroid disease     Patient Active Problem List   Diagnosis Date Noted  . Gout attack 08/14/2016  . Chronic a-fib ([place]) 08/14/2016  . Chronic anticoagulation 08/14/2016  . Acute on chronic systolic heart failure ([place]) 08/09/2016    Past Surgical History:  Procedure Laterality Date  . ABDOMINAL HYSTERECTOMY    . CORONARY ARTERY BYPASS GRAFT       OB History   No obstetric history on file.     Family History  Problem Relation Age of Onset  . Heart attack Mother   . Heart attack Father   . Heart attack Sister   . Breast cancer Neg Hx     Social History   Tobacco Use  . Smoking status: Former Smoker    Types: Cigarettes    Quit date: 07/30/2014    Years since quitting: 5.4  . Smokeless tobacco: Never Used  Substance Use Topics  . Alcohol use: No  . Drug use: No    Home Medications Prior to Admission medications   Medication Sig Start Date End Date Taking? Authorizing Provider  acetaminophen (TYLENOL) 500 MG tablet Take 1,000 mg by  mouth in the morning and at bedtime.   Yes [provider]  colchicine 0.6 MG tablet Take 1 tablet (0.6 mg total) by mouth 2 (two) times daily. Patient taking differently: Take 0.3 mg by mouth every other day.  08/15/16  Yes Reino Bellis B, NP  hydrALAZINE (APRESOLINE) 10 MG tablet Take 10 mg by mouth 3 (three) times daily.    Yes [provider]  levothyroxine (SYNTHROID, LEVOTHROID) 25 MCG tablet Take 25 mcg by mouth daily before breakfast.   Yes [provider]  metoprolol succinate (TOPROL-XL) 25 MG 24 hr tablet Take 50 mg by mouth daily.   Yes [provider]  nitroGLYCERIN (NITROSTAT) 0.4 MG SL tablet Place 0.4 mg under the tongue every 5 (five) minutes as needed for chest pain.  08/22/19  Yes  [provider]  polyethylene glycol (MIRALAX / GLYCOLAX) 17 g packet Take 17 g by mouth daily as needed for mild constipation.   Yes [provider]  pravastatin (PRAVACHOL) 20 MG tablet Take 20 mg by mouth daily.   Yes [provider]  spironolactone (ALDACTONE) 25 MG tablet Take 25 mg by mouth daily.   Yes [provider]  torsemide (DEMADEX) 20 MG tablet Take 40 mg by mouth daily.   Yes [provider]  warfarin (COUMADIN) 5 MG tablet Take 1 tablet (5 mg total) by mouth See admin instructions. 2.5mg  Mon, Tues Thurs, Sat and 5mg  Wed, Fri, Sunday Patient taking differently: Take 5 mg by mouth every evening.  08/14/16  Yes Barrett, Evelene Croon, PA-C  bisacodyl (DULCOLAX) 5 MG EC tablet Take 1-2 tablets (5-10 mg total) by mouth daily as needed for moderate constipation. Patient not taking: Reported on 01/08/2020 08/14/16   Barrett, Evelene Croon, PA-C  docusate sodium (COLACE) 100 MG capsule Take 1 capsule (100 mg total) by mouth daily as needed for mild constipation or moderate constipation. Patient not taking: Reported on 01/08/2020 08/14/16   Barrett, Evelene Croon, PA-C  levETIRAcetam (KEPPRA) 500 MG tablet Take 1 tablet (500 mg total) by mouth 2 (two) times daily. 01/15/20 02/14/20  Eustaquio Maize, PA-C  potassium chloride (KLOR-CON) 10 MEQ tablet Take 1 tablet (10 mEq total) by mouth daily for 4 days. 01/15/20 01/19/20  Alroy Bailiff, Randie Tallarico, PA-C    Allergies    Avocado, Banana, Plantain, Ace inhibitors, Codeine, Prednisone, Tape, and Latex  Review of Systems   Review of Systems  Constitutional: Negative for chills and fever.  Eyes: Negative for visual disturbance.  Musculoskeletal: Positive for neck pain. Negative for neck stiffness.  Skin: Negative for rash.  Neurological: Positive for dizziness, seizures and headaches. Negative for weakness, light-headedness and numbness.  All other systems reviewed and are negative.   Physical Exam Updated Vital Signs BP  110/81   Pulse 70   Temp 98.3 F (36.8 C) (Oral)   Resp 18   Ht 5\' 9"  (1.753 m)   Wt 86.2 kg   SpO2 99%   BMI 28.06 kg/m   Physical Exam Vitals and nursing Palmer reviewed.  Constitutional:      Appearance: She is not ill-appearing or diaphoretic.  HENT:     Head: Normocephalic.     Comments: Small abrasion noted to tip of tongue on left side No raccoon's sign or battle's sign. Negative hemotympanum bilaterally.  Well healed surgical scar to right parietotemporal area consistent with previous craniotomy    Right Ear: Tympanic membrane normal.     Left Ear: Tympanic membrane normal.  Eyes:  Extraocular Movements: Extraocular movements intact.     Conjunctiva/sclera: Conjunctivae normal.     Pupils: Pupils are equal, round, and reactive to light.  Cardiovascular:     Rate and Rhythm: Normal rate and regular rhythm.     Pulses: Normal pulses.  Pulmonary:     Effort: Pulmonary effort is normal.     Breath sounds: Normal breath sounds. No wheezing, rhonchi or rales.  Abdominal:     Palpations: Abdomen is soft.     Tenderness: There is no abdominal tenderness. There is no guarding or rebound.  Musculoskeletal:     Cervical back: Neck supple.  Skin:    General: Skin is warm and dry.  Neurological:     Mental Status: She is alert.     Comments: CN 3-12 grossly intact A&O x4 GCS 15 Sensation and strength intact Coordination with finger-to-nose WNL Neg romberg, neg pronator drift     ED Results / Procedures / Treatments   Labs (all labs ordered are listed, but only abnormal results are displayed) Labs Reviewed  CBC WITH DIFFERENTIAL/PLATELET - Abnormal; Notable for the following components:      Result Value   Hemoglobin 11.7 (*)    MCV 77.1 (*)    MCH 23.7 (*)    RDW 16.8 (*)    All other components within normal limits  COMPREHENSIVE METABOLIC PANEL - Abnormal; Notable for the following components:   Potassium 3.1 (*)    Creatinine, Ser 2.00 (*)    GFR calc  non Af Amer 24 (*)    GFR calc Af Amer 28 (*)    All other components within normal limits  PROTIME-INR - Abnormal; Notable for the following components:   Prothrombin Time 20.7 (*)    INR 1.9 (*)    All other components within normal limits  URINALYSIS, ROUTINE W REFLEX MICROSCOPIC - Abnormal; Notable for the following components:   Color, Urine STRAW (*)    All other components within normal limits  CBG MONITORING, ED    EKG EKG Interpretation  Date/Time:  Thursday January 15 2020 11:43:34 EDT Ventricular Rate:  70 PR Interval:    QRS Duration: 133 QT Interval:  511 QTC Calculation: 552 R Axis:   30 Text Interpretation: Sinus rhythm Short PR interval Probable left atrial enlargement IVCD, consider atypical LBBB No STEMI Confirmed by Octaviano Glow [phone]) on 01/15/2020 12:27:31 PM   Radiology CT Head Wo Contrast  Result Date: 01/15/2020 CLINICAL DATA:  Seizure. EXAM: CT HEAD WITHOUT CONTRAST TECHNIQUE: Contiguous axial images were obtained from the base of the skull through the vertex without intravenous contrast. COMPARISON:  None. FINDINGS: Brain: Right frontal cortical low density is noted which most likely represents postoperative change. No definite hemorrhage is noted. No midline shift is noted. Ventricular size is within normal limits. Vascular: No hyperdense vessel or unexpected calcification. Skull: Status post right frontal craniotomy. No acute osseous abnormality is noted. Sinuses/Orbits: No acute finding. Other: None. IMPRESSION: Right frontal cortical low density is noted which most likely represents postoperative change given the presence of overlying right frontal craniotomy; correlation with patient's history is recommended. No definite hemorrhage is noted. Electronically Signed   By: Marijo Conception M.D.   On: 01/15/2020 16:39    Procedures Procedures (including critical care time)  Medications Ordered in ED Medications  lidocaine (LIDODERM) 5 % 1 patch (1 patch  Transdermal Patch Applied 01/15/20 1615)  levETIRAcetam (KEPPRA) IVPB 1000 mg/100 mL premix (1,000 mg Intravenous New Bag/Given 01/15/20 1741)  acetaminophen (TYLENOL) tablet 1,000 mg (1,000 mg Oral Given 01/15/20 1330)  potassium chloride SA (KLOR-CON) CR tablet 20 mEq (20 mEq Oral Given 01/15/20 1614)  morphine 2 MG/ML injection 2 mg (2 mg Intravenous Given 01/15/20 1624)    ED Course  I have reviewed the triage vital signs and the nursing notes.  Pertinent labs & imaging results that were available during my care of the patient were reviewed by me and considered in my medical decision making (see chart for details).  Clinical Course as of Jan 14 1754  Thu Jan 15, 2020  1331 74 yo female w/ hx of brain cancer s/p radiation tx at [facility] in July 2021, 1 single prior seizure, no longer on AED's (formerly keppra), presenting to Ed with suspected seizure activity.   Pt coming from assisted living.  Bit her tongue and had generalized shaking, remembers "parts of it."  EMS reported pt post-ictal on scene when they arrived.  Now she is back to baseline mental state.  I suspect she may have had a breakthrough seizure in the setting of her brain cancer and treatment.  No focal neuro deficits on exam currently.  Plan to check labs, CTH.  Will discuss AED's with neuro   [MT]    Clinical Course User Index [MT] Wyvonnia Dusky, MD   MDM Rules/Calculators/A&P                          74 year old female with history of seizure secondary to brain tumor from renal cell carcinoma with recent craniotomy and nephrectomy who presents to the ED via EMS for possible seizure like activity. Pt was on Keppra however taken off 2 months ago; no seizures since first secondary to brain tumor until possibly today. Pt reports a "twitching" sensation to left side of face; no other tonic clonic activity however noted to be post ictal with EMS with abrasion to tongue. On arrival to the ED pt is afebrile, nontachycardic, and  nontachypneic. She complains of a mild headache and neck pain however reports she has neck pain quite frequently. Pt is on coumadin; no head trauma however will obtain CT Head to rule out bleed at this time. Will obtain screening labs with plans to discuss with neurology for further reccs.   CBC without leukocytosis. Hgb stable at 11.7 CMP with sodium 142, potassium 4.1. Creatinine stable at 2.00  Lab Results  Component Value Date   CREATININE 2.00 (H) 01/15/2020   CREATININE 2.41 (H) 08/24/2016   CREATININE 2.01 (H) 08/14/2016   INR 1.9 U/A without infection   CT Head without signs of bleed. Pt does have post op changes in the setting of her craniotomy. I have reviewed old images in [place]; appears unchanged.   Pt to be loaded with keppra at this time per neurology recommendations. They were not formally consulted however do recommend starting patient back on her regular keppra dose - 500 mg BID. I have discussed this with pt who agrees with plan. Will plan to discharge home with keppra and have her follow up with neurology; she does not have a neurologist here. Will provide info for [facility].   Pt received keppra and stable for discharge home.  This Palmer was prepared using Dragon voice recognition software and may include unintentional dictation errors due to the inherent limitations of voice recognition software.  Final Clinical Impression(s) / ED Diagnoses Final diagnoses:  Seizure ([place])  Hypokalemia  Rx / DC Orders ED Discharge Orders         Ordered    levETIRAcetam (KEPPRA) 500 MG tablet  2 times daily        01/15/20 1659    potassium chloride (KLOR-CON) 10 MEQ tablet  Daily        01/15/20 1755           Discharge Instructions     Please follow up with [facility] to establish care.  Pick up Keppra prescription and take as prescribed. The neurologist will need to take over this prescription.  I have prescribed a short course  of potassium supplements today as your potassium level was mildly low. Please have your potassium level rechecked in 1-2 weeks.  Continue taking your other medications Keep your other appointments as scheduled in the next 1-2 weeks Return to the ED for any worsening symptoms        Eustaquio Maize, PA-C 01/15/20 1755    Wyvonnia Dusky, MD 01/15/20 [phone]

## 2020-01-15 NOTE — ED Triage Notes (Signed)
Pt bib ems from [place] with possible seizure. She bit her tongue and was postictal. No incontinence. No apparent injuries. Pt had a seizure after second covid vaccine and at that time a brain tumor was found resulting in surgery. Pt thinks she was due for a follow up CT soon. Pt is on warfarin. VS stable, cbg 156. Hx of nephrectomy. [facility]PO[facility] Alfreados Alroy Dust. M) [phone], w) [phone]. [place] Kitchen

## 2020-01-15 NOTE — ED Notes (Signed)
Received interrogation report from [place] at [facility] [employer]. Pt was in Atrial fibrillation earlier today with some small runs of V-Tach.  No shock was given.   Pt is now in Atrial flutter and being Ventricular paced. Report relayed to [place], [place]

## 2020-01-15 NOTE — Discharge Instructions (Addendum)
Please follow up with [facility] to establish care.  Pick up Keppra prescription and take as prescribed. The neurologist will need to take over this prescription.  I have prescribed a short course of potassium supplements today as your potassium level was mildly low. Please have your potassium level rechecked in 1-2 weeks.  Continue taking your other medications Keep your other appointments as scheduled in the next 1-2 weeks Return to the ED for any worsening symptoms

## 2020-01-20 ENCOUNTER — Ambulatory Visit (HOSPITAL_COMMUNITY)
Admission: RE | Admit: 2020-01-20 | Discharge: 2020-01-20 | Disposition: A | Discharge status: Home / Self Care | Payer: Medicare Other | Source: Ambulatory Visit | Attending: Oncology | Admitting: Oncology

## 2020-01-20 ENCOUNTER — Other Ambulatory Visit: Payer: Self-pay

## 2020-01-20 ENCOUNTER — Inpatient Hospital Stay: Payer: Medicare Other

## 2020-01-20 DIAGNOSIS — R918 Other nonspecific abnormal finding of lung field: Secondary | ICD-10-CM | POA: Insufficient documentation

## 2020-01-20 DIAGNOSIS — N2889 Other specified disorders of kidney and ureter: Secondary | ICD-10-CM | POA: Diagnosis present

## 2020-01-20 DIAGNOSIS — C649 Malignant neoplasm of unspecified kidney, except renal pelvis: Secondary | ICD-10-CM | POA: Insufficient documentation

## 2020-01-20 DIAGNOSIS — C641 Malignant neoplasm of right kidney, except renal pelvis: Secondary | ICD-10-CM | POA: Diagnosis not present

## 2020-01-20 LAB — CMP (CANCER CENTER ONLY)
ALT: 7 U/L (ref 0–44)
AST: 19 U/L (ref 15–41)
Albumin: 4 g/dL (ref 3.5–5.0)
Alkaline Phosphatase: 75 U/L (ref 38–126)
Anion gap: 8 (ref 5–15)
BUN: 18 mg/dL (ref 8–23)
CO2: 29 mmol/L (ref 22–32)
Calcium: 10.6 mg/dL — ABNORMAL HIGH (ref 8.9–10.3)
Chloride: 103 mmol/L (ref 98–111)
Creatinine: 1.86 mg/dL — ABNORMAL HIGH (ref 0.44–1.00)
GFR, Est AFR Am: 30 mL/min — ABNORMAL LOW (ref >60)
GFR, Estimated: 26 mL/min — ABNORMAL LOW (ref >60)
Glucose, Bld: 84 mg/dL (ref 70–99)
Potassium: 3.6 mmol/L (ref 3.5–5.1)
Sodium: 140 mmol/L (ref 135–145)
Total Bilirubin: 0.8 mg/dL (ref 0.3–1.2)
Total Protein: 7.1 g/dL (ref 6.5–8.1)

## 2020-01-20 LAB — CBC WITH DIFFERENTIAL (CANCER CENTER ONLY)
Abs Immature Granulocytes: 0.03 10*3/uL (ref 0.00–0.07)
Basophils Absolute: 0.1 10*3/uL (ref 0.0–0.1)
Basophils Relative: 1 %
Eosinophils Absolute: 0.2 10*3/uL (ref 0.0–0.5)
Eosinophils Relative: 2 %
HCT: 37 % (ref 36.0–46.0)
Hemoglobin: 11.9 g/dL — ABNORMAL LOW (ref 12.0–15.0)
Immature Granulocytes: 0 %
Lymphocytes Relative: 33 %
Lymphs Abs: 2.3 10*3/uL (ref 0.7–4.0)
MCH: 24.2 pg — ABNORMAL LOW (ref 26.0–34.0)
MCHC: 32.2 g/dL (ref 30.0–36.0)
MCV: 75.2 fL — ABNORMAL LOW (ref 80.0–100.0)
Monocytes Absolute: 0.8 10*3/uL (ref 0.1–1.0)
Monocytes Relative: 12 %
Neutro Abs: 3.5 10*3/uL (ref 1.7–7.7)
Neutrophils Relative %: 52 %
Platelet Count: 314 10*3/uL (ref 150–400)
RBC: 4.92 MIL/uL (ref 3.87–5.11)
RDW: 16.5 % — ABNORMAL HIGH (ref 11.5–15.5)
WBC Count: 6.8 10*3/uL (ref 4.0–10.5)
nRBC: 0 % (ref 0.0–0.2)

## 2020-01-20 IMAGING — CT CT CHEST W/O CM
2 of 4 series · 14 of 36 positions shown, 17 images · non-contrast
Comparison: None.

CLINICAL DATA: Follow-up metastatic renal cell carcinoma.

EXAM:
CT CHEST, ABDOMEN AND PELVIS WITHOUT CONTRAST
TECHNIQUE: Multidetector CT imaging of the chest, abdomen and pelvis was
performed following the standard protocol without IV contrast.

[Series 2: cap w/o · axial · non-contrast · 0.82mm/px · z∈[-432,+83]mm · 11 of 125 slices shown, 14 images]
[im 11/125  mediastinal]
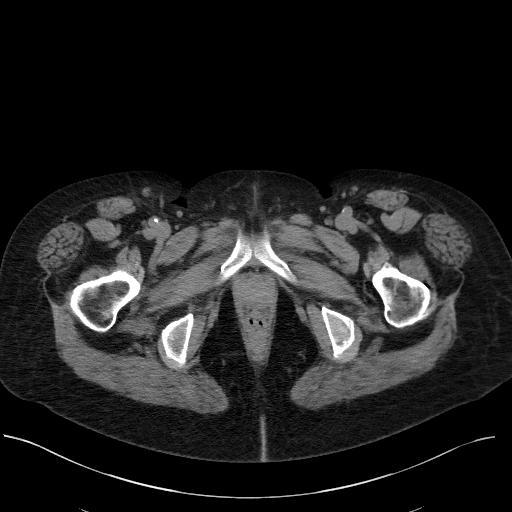
[im 11/125  lung]
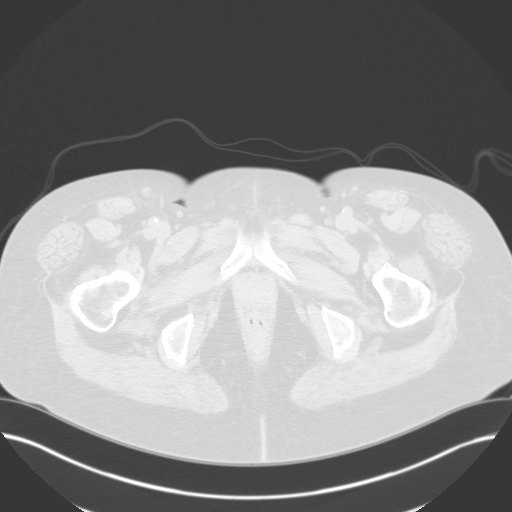
[im 21/125  lung]
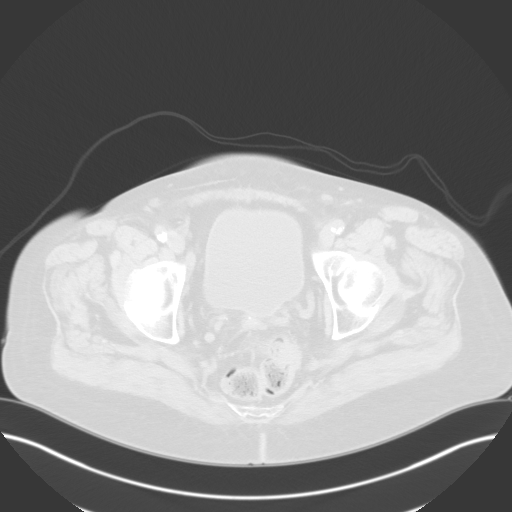
[im 32/125  lung]
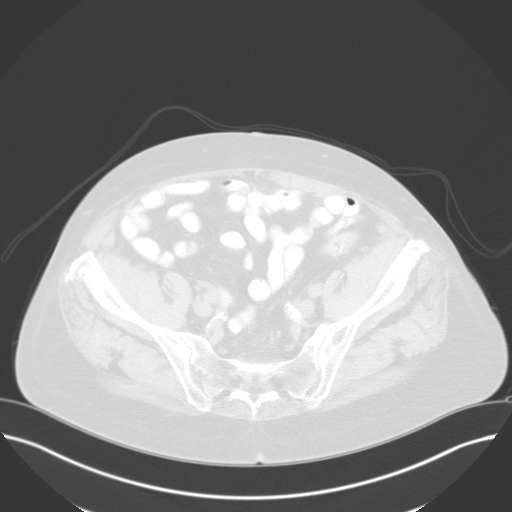
[im 42/125  lung]
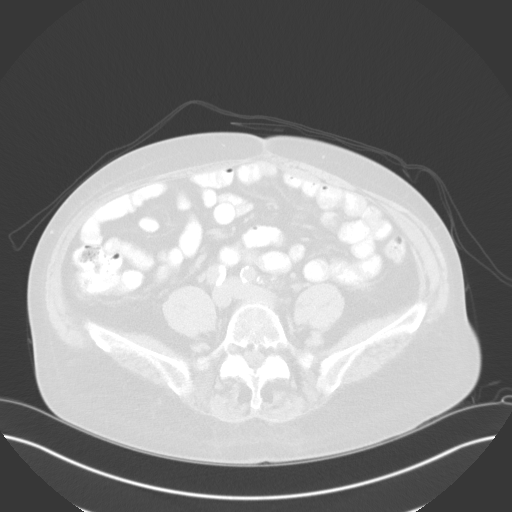
[im 52/125  mediastinal]
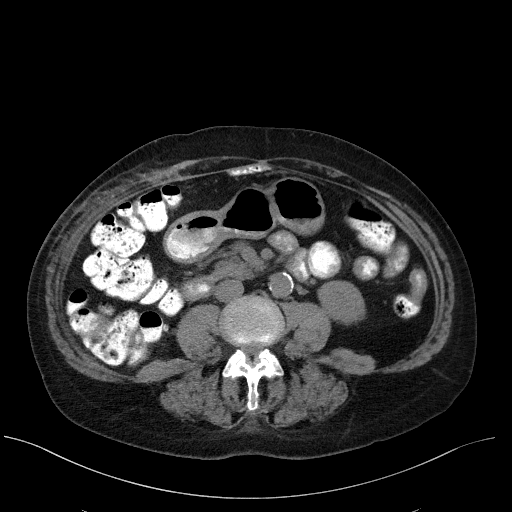
[im 52/125  lung]
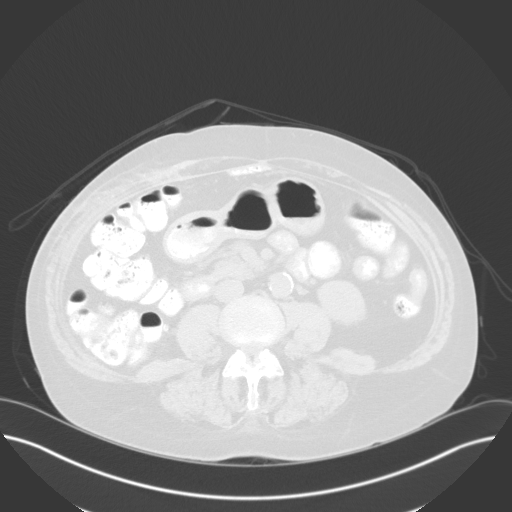
[im 63/125  lung]
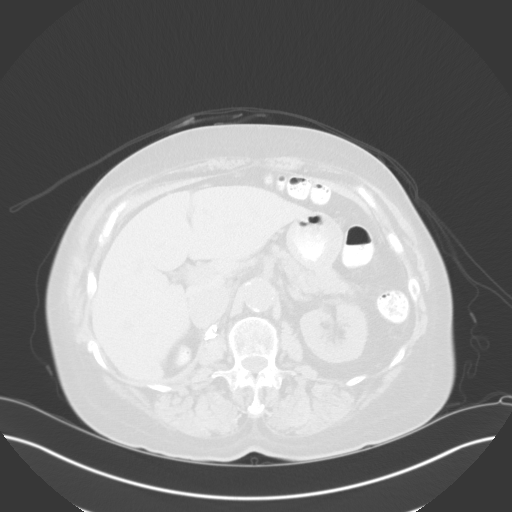
[im 73/125  lung]
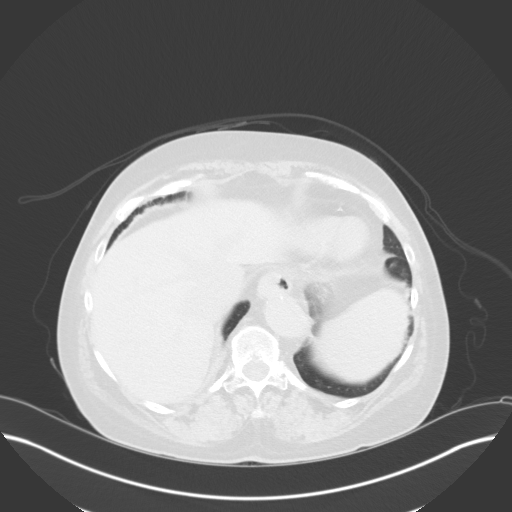
[im 83/125  lung]
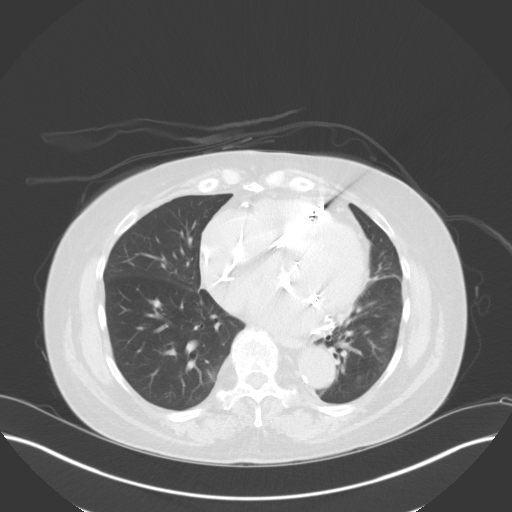
[im 94/125  mediastinal]
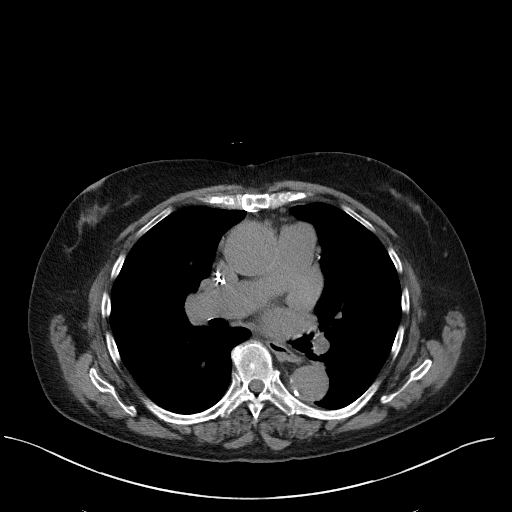
[im 94/125  lung]
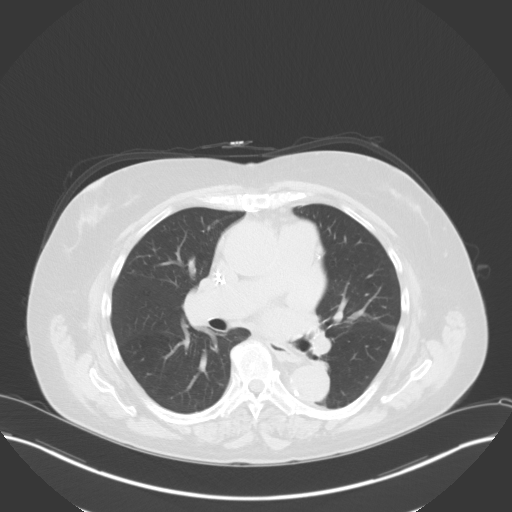
[im 104/125  lung]
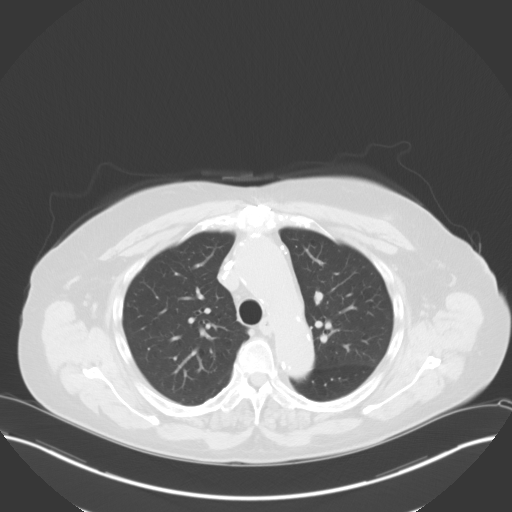
[im 114/125  lung]
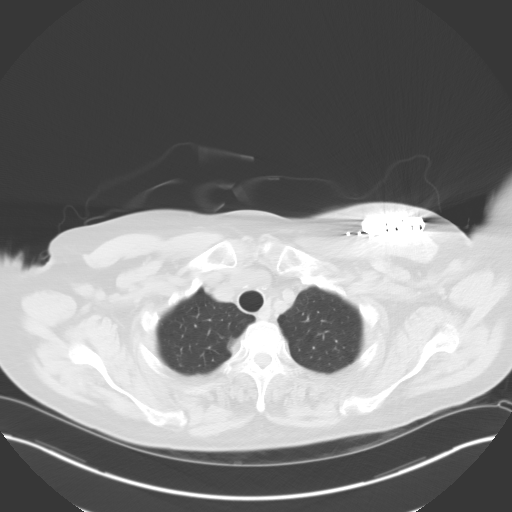

[Series 4: coronals · coronal · 1.02mm/px · 3 of 118 slices shown]
[im 24/118  lung]
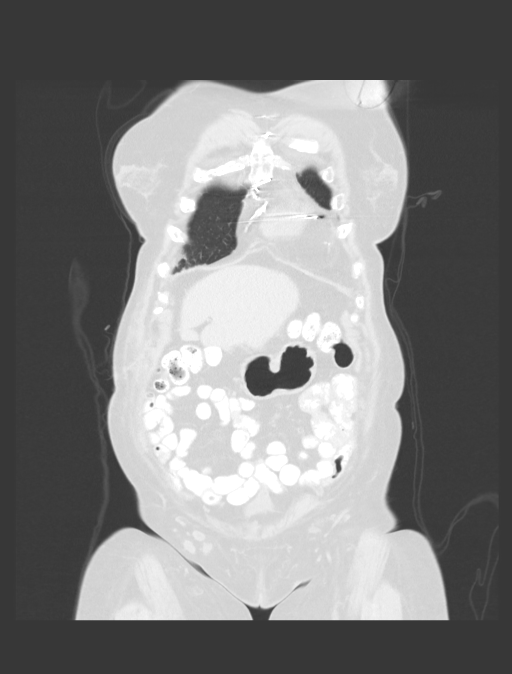
[im 47/118  lung]
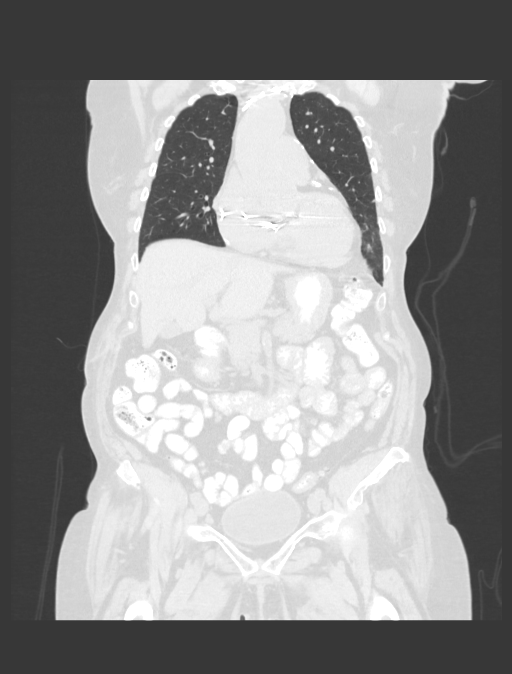
[im 71/118  lung]
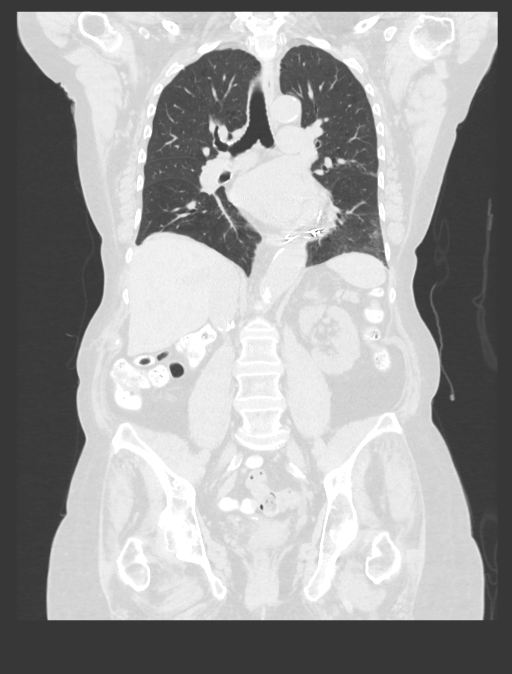

[14 of 36 positions shown; findings below may reference images not displayed]

FINDINGS: CT CHEST FINDINGS

Cardiovascular: Prior CABG and mitral valve replacement. Pacemaker
leads in right heart and coronary sinus. Aortic and coronary artery
atherosclerosis noted. Ascending thoracic aortic aneurysm is seen
measuring 4.5 cm in maximum diameter.

Mediastinum/Lymph Nodes: No masses or pathologically enlarged lymph
nodes identified on this unenhanced exam.

Lungs/Pleura: A few tiny scattered pulmonary nodules are seen
bilaterally, largest in the right middle lobe measuring 4 mm on
image 93/6. Mild pleural-parenchymal scarring is seen in both lower
lobes. No evidence of pulmonary infiltrate or pleural effusion.

Musculoskeletal:  No suspicious bone lesions identified.

CT ABDOMEN AND PELVIS FINDINGS

Hepatobiliary: No masses visualized on this unenhanced exam.
Gallbladder is unremarkable. No evidence of biliary ductal
dilatation.

Pancreas: No mass or inflammatory changes identified on this
unenhanced exam.

Spleen:  Within normal limits in size.

Adrenals/Urinary Tract: Postop changes from previous right
nephrectomy noted. Unremarkable unenhanced appearance of the left
kidney. No evidence of ureteral calculi or hydronephrosis.
Unremarkable unopacified urinary bladder.

Stomach/Bowel: No evidence of obstruction, inflammatory process, or
abnormal fluid collections. Normal appendix visualized.

Vascular/Lymphatic: No pathologically enlarged lymph nodes
identified. No abdominal aortic aneurysm. Aortic atherosclerosis
noted.

Reproductive: Prior hysterectomy noted. Adnexal regions are
unremarkable in appearance.

Other:  None.

Musculoskeletal:  No suspicious bone lesions identified.
IMPRESSION: Previous right nephrectomy. No evidence of recurrent or metastatic
carcinoma within the abdomen, or pelvis.

Tiny indeterminate bilateral pulmonary nodules measuring up to 4 mm.
Recommend continued attention on follow-up imaging.

4.5 cm ascending thoracic aortic aneurysm. Recommend semi-annual
imaging followup by CTA or MRA and referral to cardiothoracic
surgery if not already obtained. This recommendation follows [8N]
ACCF/AHA/AATS/ACR/ASA/SCA/MARIAH/MARIAH/MARIAH/MARIAH Guidelines for the
Diagnosis and Management of Patients With Thoracic Aortic Disease.
Circulation. [8N]; 121: [id number]. Aortic aneurysm NOS ([8N]-[8N])

## 2020-01-21 ENCOUNTER — Telehealth: Payer: Self-pay

## 2020-01-21 NOTE — Telephone Encounter (Signed)
Called patient to let her know that per Dr. Alen Blew, scans are normal.

## 2020-01-21 NOTE — Telephone Encounter (Signed)
-----   Message from Wyatt Portela, MD sent at 01/21/2020  9:27 AM EDT ----- Please let her know her scans are normal.

## 2020-02-03 ENCOUNTER — Ambulatory Visit (HOSPITAL_COMMUNITY)
Admission: RE | Admit: 2020-02-03 | Discharge: 2020-02-03 | Disposition: A | Discharge status: Home / Self Care | Payer: Medicare Other | Source: Ambulatory Visit | Attending: Oncology | Admitting: Oncology

## 2020-02-03 ENCOUNTER — Ambulatory Visit (HOSPITAL_COMMUNITY)
Admission: RE | Admit: 2020-02-03 | Discharge: 2020-02-03 | Disposition: A | Discharge status: Home / Self Care | Payer: Medicare Other | Source: Ambulatory Visit | Attending: Physician Assistant | Admitting: Physician Assistant

## 2020-02-03 ENCOUNTER — Other Ambulatory Visit: Payer: Self-pay

## 2020-02-03 DIAGNOSIS — C649 Malignant neoplasm of unspecified kidney, except renal pelvis: Secondary | ICD-10-CM | POA: Diagnosis present

## 2020-02-03 DIAGNOSIS — Z950 Presence of cardiac pacemaker: Secondary | ICD-10-CM

## 2020-02-03 DIAGNOSIS — G9389 Other specified disorders of brain: Secondary | ICD-10-CM | POA: Diagnosis present

## 2020-02-03 IMAGING — MR MR HEAD W/O CM
9 of 10 series · 36 of 48 positions shown · non-contrast
Comparison: [DATE] head CT.

CLINICAL DATA: Urologic cancer, assess treatment response.

EXAM:
MRI HEAD WITHOUT CONTRAST
TECHNIQUE: Multiplanar, multiecho pulse sequences of the brain and surrounding
structures were obtained without intravenous contrast.

[Series 4: DWI · axial · 3.0mm · 1.09mm/px · z∈[-54,+78]mm · 8 of 90 slices shown (1 of 4)]
[im 1/90]
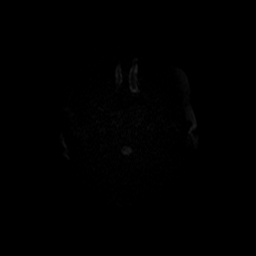
[im 10/90]
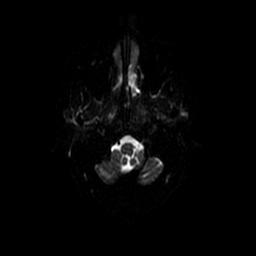
[im 30/90]
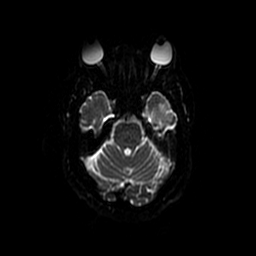
[im 40/90]
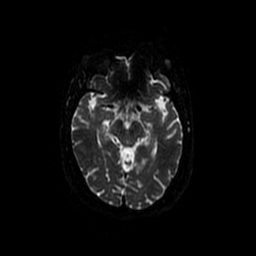
[im 50/90]
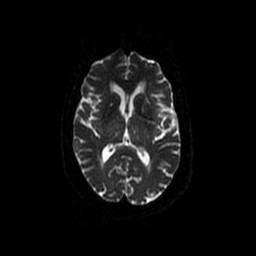
[im 60/90]
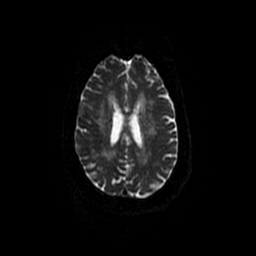
[im 80/90]
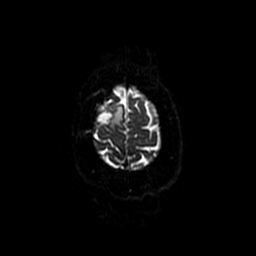
[im 90/90]
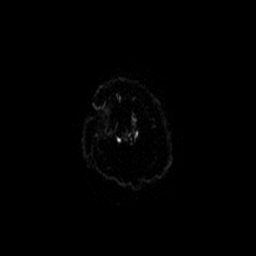

[Series 5: DWI · coronal · 5.0mm · 1.09mm/px · 8 of 70 slices shown (2 of 4)]
[im 1/70]
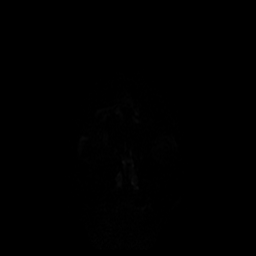
[im 10/70]
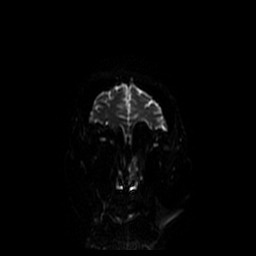
[im 20/70]
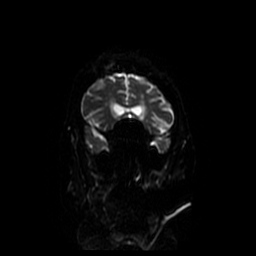
[im 30/70]
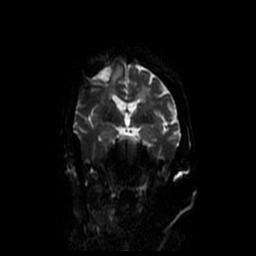
[im 40/70]
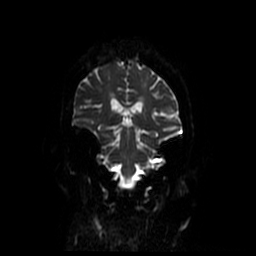
[im 50/70]
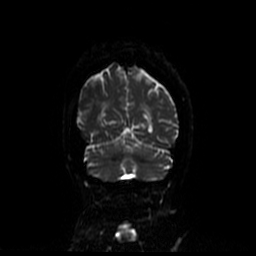
[im 60/70]
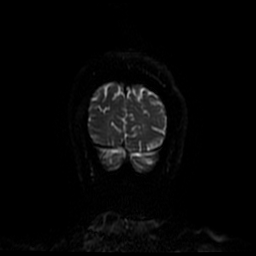
[im 70/70]
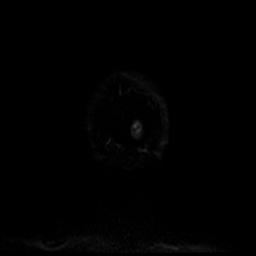

[Series 6: T1 · sagittal · 5.0mm · 0.47mm/px · 2 of 23 slices shown (1 of 2)]
[im 1/23]
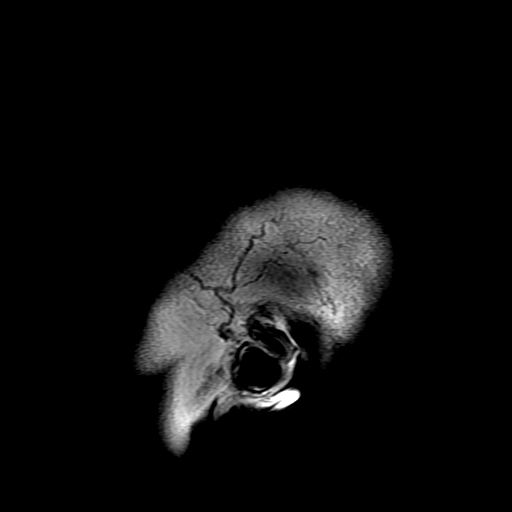
[im 23/23]
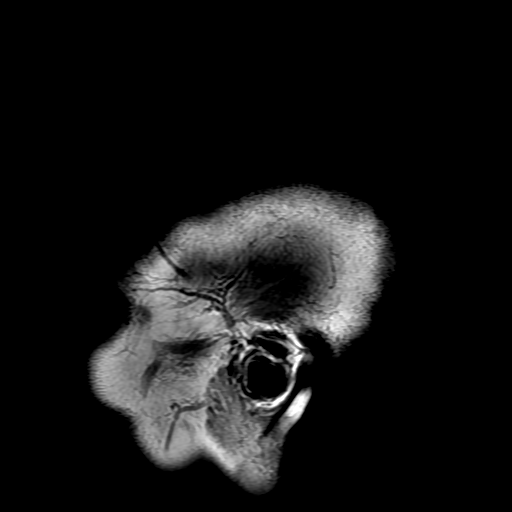

[Series 7: T2 · axial · 5.0mm · 0.43mm/px · z∈[-56,+82]mm · 2 of 24 slices shown (1 of 2)]
[im 1/24]
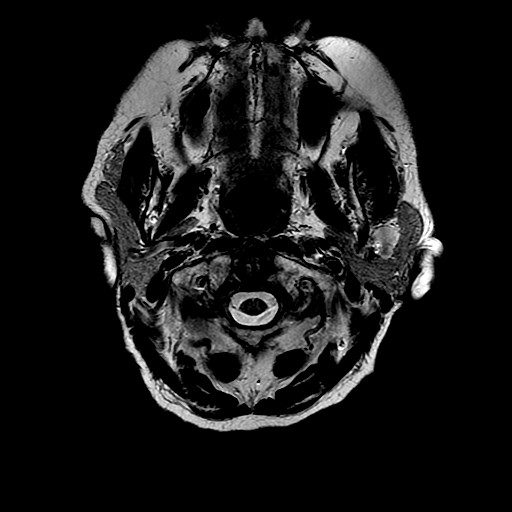
[im 24/24]
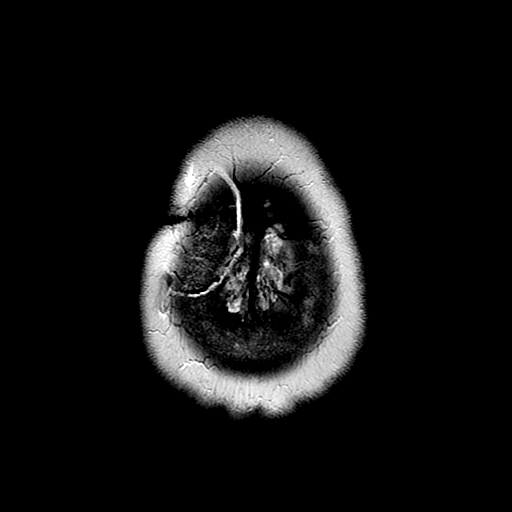

[Series 8: FLAIR · axial · 3.0mm · 0.43mm/px · z∈[-42,+90]mm · 2 of 23 slices shown]
[im 1/23]
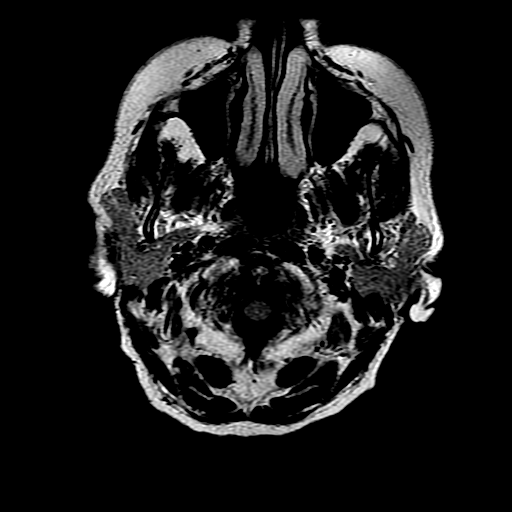
[im 23/23]
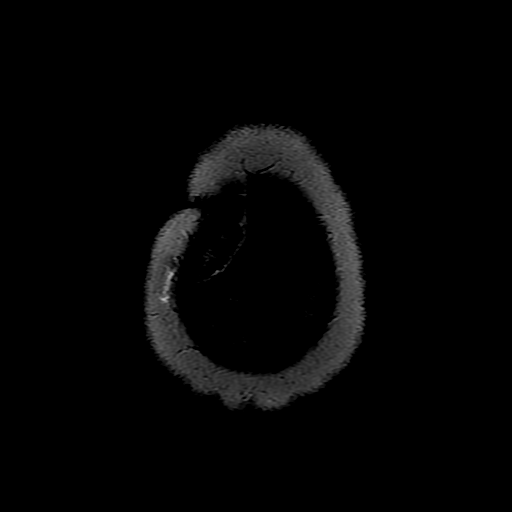

[Series 10: T1 · axial · 3.0mm · 0.47mm/px · z∈[-60,-43]mm · 2 of 100 slices shown (2 of 2)]
[im 1/100]
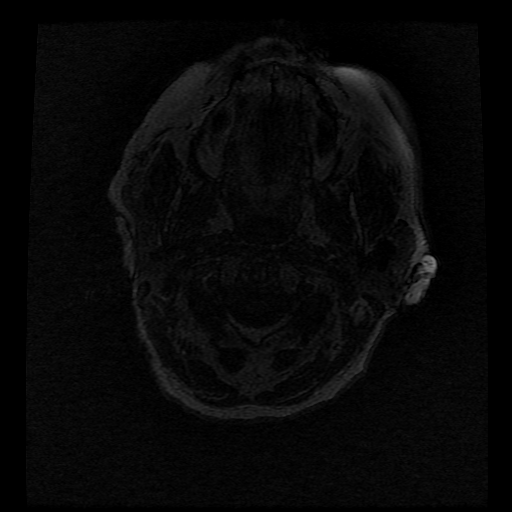
[im 12/100]
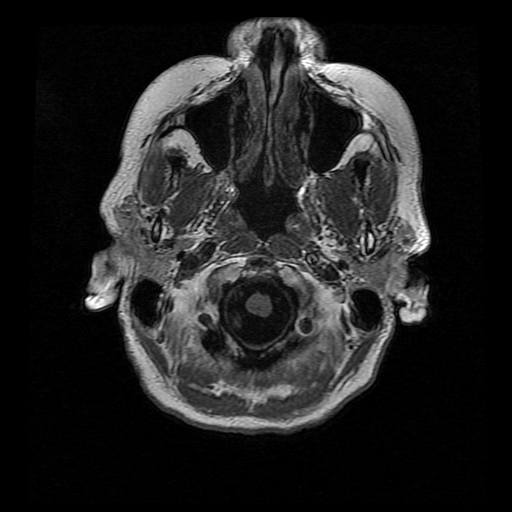

[Series 11: T2 · coronal · 5.0mm · 0.39mm/px · 3 of 27 slices shown (2 of 2)]
[im 1/27]
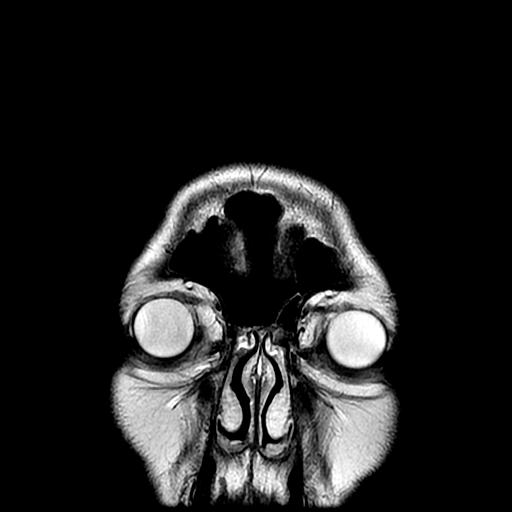
[im 14/27]
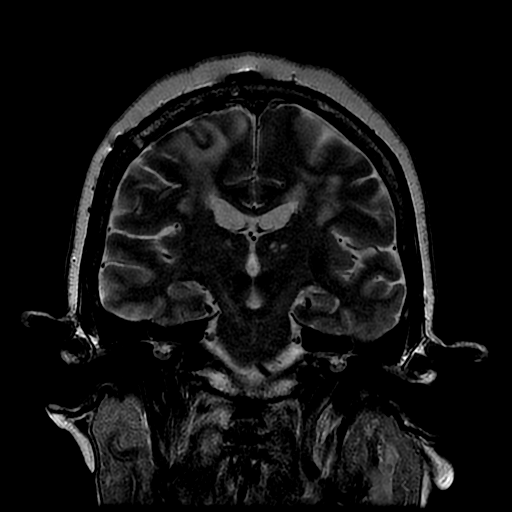
[im 27/27]
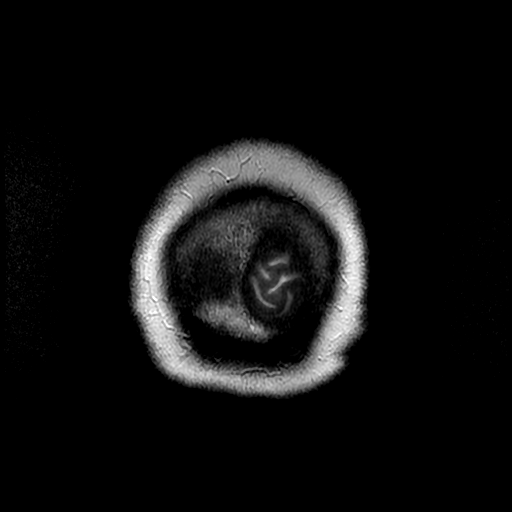

[Series 400: DWI · axial · 3.0mm · 1.09mm/px · z∈[-54,+78]mm · 5 of 45 slices shown (3 of 4)]
[im 1/45]
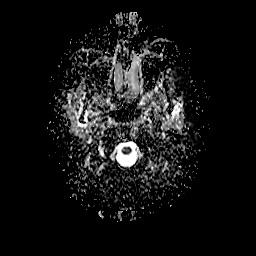
[im 12/45]
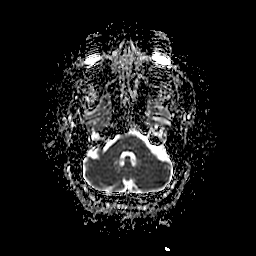
[im 23/45]
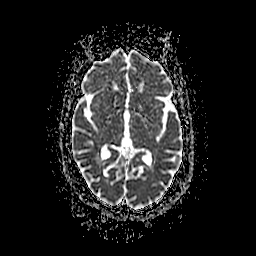
[im 34/45]
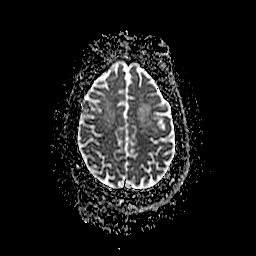
[im 45/45]
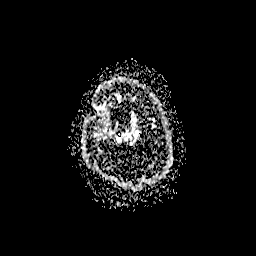

[Series 500: DWI · coronal · 5.0mm · 1.09mm/px · 4 of 35 slices shown (4 of 4)]
[im 1/35]
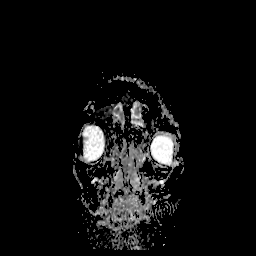
[im 12/35]
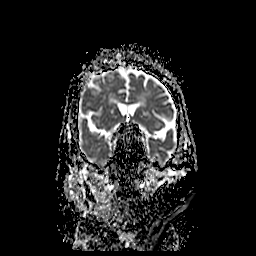
[im 23/35]
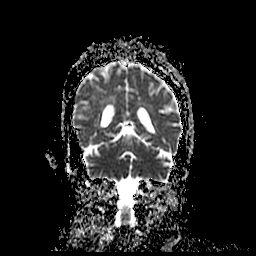
[im 35/35]
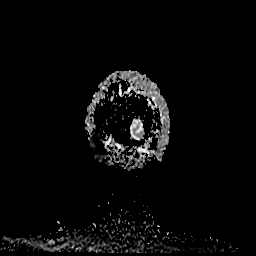

[36 of 48 positions shown; findings below may reference images not displayed]

FINDINGS: Brain: Sequela of right frontal craniotomy for mass resection. Right
frontal encephalomalacia and hemosiderin deposition. Thin focal
restricted diffusion along the resection cavity margins.

Inferior and lateral to the resection cavity there is a 2.0 x 1.8 x
0.8 cm T2 hyperintense lesion overlying the right frontal convexity
([DATE], [DATE]) which restricts diffusion. There is perilesional SWI
signal dropout and intrinsic T1/FLAIR hyperintensity concerning for
hemorrhage. Lack of intravenous contrast limits evaluation.

No midline shift, ventriculomegaly or extra-axial fluid collection.
Mild cerebral atrophy with ex vacuo dilatation. Scattered and
confluent supratentorial white matter and pontine T2
hyperintensities.

Vascular: Normal flow voids.

Skull and upper cervical spine: Sequela of right frontal craniotomy.
No focal osseous lesions.

Sinuses/Orbits: Normal orbits. Clear paranasal sinuses. No mastoid
effusion.

Other: None.
IMPRESSION: Sequela of right frontal craniotomy for mass resection. Hemorrhagic
right frontal convexity mass measuring 2.0 cm is concerning for
recurrent disease.

Confluent T2 hyperintense supratentorial white matter signal may
reflect post treatment sequela.

Please note lack of intravenous contrast limits evaluation.

These results will be called to the ordering clinician or
representative by the Radiologist Assistant, and communication
documented in the PACS or [REDACTED].

## 2020-02-03 IMAGING — DX DG CHEST 1V
1 series · 1 of 1 positions shown · non-contrast
Comparison: [DATE].  CT chest [DATE].

CLINICAL DATA: Assess for abandoned lead.

EXAM:
CHEST  1 VIEW

[chest pa]
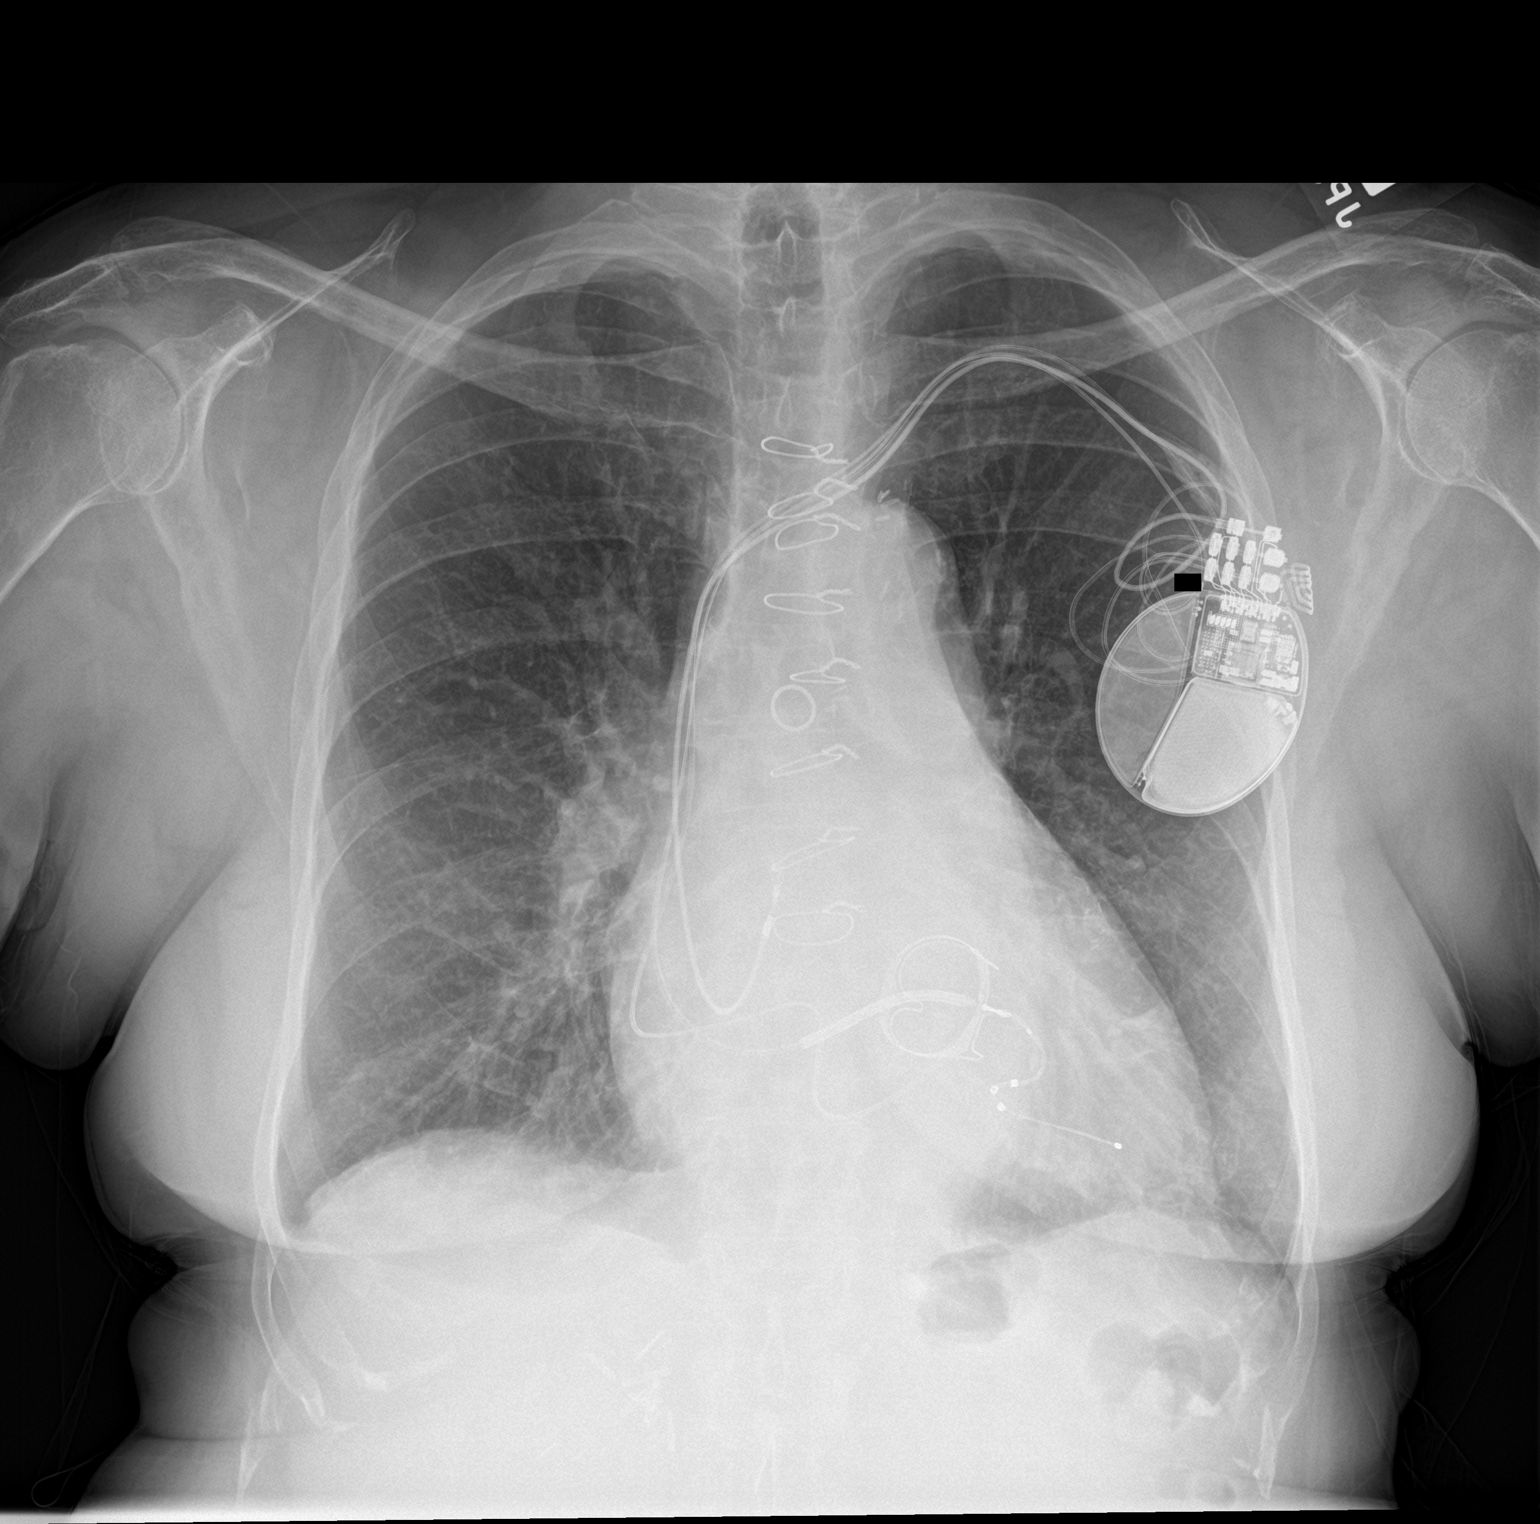

[1 of 1 positions shown; findings below may reference images not displayed]

FINDINGS: Previous median sternotomy, CABG and mitral valve replacement.
Pacemaker/AICD in place. There is a right ventricular lead/AICD.
There is a right atrial lead. There is probably a coronary sinus
lead. I cannot determine if all 3 are functional based on this
single view.
IMPRESSION: Previous median sternotomy and CABG. Previous mitral valve
replacement. Pacemaker/AICD in place. Right atrial lead, right
ventricular lead/AICD, and apparent coronary sinus lead. I cannot
determine if all 3 are functional based on this single view.

## 2020-02-03 NOTE — Progress Notes (Signed)
Per order, changed device settings for MRI to  VOO at 80 bpm  Tachy-therapies to off  Will program device back to pre-MRI settings after completion of exam.

## 2020-02-04 ENCOUNTER — Other Ambulatory Visit: Payer: Self-pay | Admitting: Oncology

## 2020-02-04 ENCOUNTER — Other Ambulatory Visit: Payer: Self-pay | Admitting: Radiation Therapy

## 2020-02-04 DIAGNOSIS — C649 Malignant neoplasm of unspecified kidney, except renal pelvis: Secondary | ICD-10-CM

## 2020-02-04 NOTE — Progress Notes (Signed)
Results of imaging studies including CT scan chest abdomen and pelvis as well as MRI were reviewed and discussed with the patient via phone today.  Imaging studies showed no evidence of metastatic disease in the chest abdomen and pelvis.  There is an area of concern around her craniotomy site that needs further investigation.  I will refer her to radiation oncology for an evaluation and possible we treatment of the isolated area of recurrence.  Neurosurgical evaluation versus an interval scan could also be an option.  This was discussed with the patient and she is agreeable with this plan.  Systemic therapy will be deferred at this time given the lack of systemic disease at least for the time being.  This will be considered in the future.

## 2020-02-09 ENCOUNTER — Inpatient Hospital Stay: Payer: Medicare Other | Attending: Oncology

## 2020-02-10 ENCOUNTER — Other Ambulatory Visit: Payer: Self-pay | Admitting: Radiation Therapy

## 2020-02-10 ENCOUNTER — Ambulatory Visit: Payer: Medicare Other | Admitting: Cardiovascular Disease

## 2020-02-10 DIAGNOSIS — C7949 Secondary malignant neoplasm of other parts of nervous system: Secondary | ICD-10-CM

## 2020-02-10 DIAGNOSIS — C7931 Secondary malignant neoplasm of brain: Secondary | ICD-10-CM

## 2020-02-10 NOTE — Addendum Note (Signed)
Addended by: Pincus Large on: 02/10/2020 09:43 AM   Modules accepted: Orders

## 2020-02-11 ENCOUNTER — Other Ambulatory Visit: Payer: Self-pay | Admitting: Radiation Therapy

## 2020-02-11 NOTE — Progress Notes (Signed)
Location/Histology of Brain Tumor: Primary Kidney cancer, brain mets.  She was found to have a 7 cm solid right kidney mass in November 2020.  She was under evaluation to have surgical resection but was delayed till about January 2021 where she had evaluation at [facility].   Patient presented with symptoms of:  She presented to [facility] on July 24, 2019 with new onset seizure and slurred speech.  She subsequently underwent radical nephrectomy completed on Oct 16, 2019 with a final pathology showed renal cell carcinoma without any specific subtype.  Her histological grade was grade 3 and the tumor measuring 9 cm extending into the perinephric tissue with a final pathological staging T3aN0.  This was performed by Dr. Thomos Lemons at [facility].   MRI Brain 02/03/2020: Sequela of right frontal craniotomy for mass resection. Hemorrhagic right frontal convexity mass measuring 2.0 cm is concerning for recurrent disease.  CT CAP 01/20/2020: Previous right nephrectomy. No evidence of recurrent or metastatic carcinoma within the abdomen, or pelvis.  Tiny indeterminate bilateral pulmonary nodules measuring up to 4 mm. Recommend continued attention on follow-up imaging.   Past or anticipated interventions, if any, per neurosurgery:  -Right frontal craniotomy 3/10/2021[facility]  Past or anticipated interventions, if any, per medical oncology:  Dr. Alen Blew 02/04/2020 -There is an area of concern around her craniotomy site that needs further investigation. -I will refer her to radiation oncology for an evaluation and possible treatment of the isolated area of recurrence.   -Neurosurgical evaluation versus an interval scan could also be an option.  This was discussed with the patient and she is agreeable with this plan. -Systemic therapy will be deferred at this time given the lack of systemic disease at least for the time being.   Dose of Decadron, if  applicable: None  Recent neurologic symptoms, if any:   Seizures: None, started back on Keppra  Headaches: Intermittent headaches, maybe once or twice a week.  Nausea: No  Dizziness/ataxia: Has mild dizziness, especially if she over exerts herself.  Difficulty with hand coordination: No  Focal numbness/weakness: No  Visual deficits/changes: No  Confusion/Memory deficits: No  Has mild pain at the nephrectomy site.  She has some discomfort from time to time.  She thinks it may be swollen around the site.  SAFETY ISSUES:  Prior radiation? SRS 27.5 Gy in 5 Fx 09/2019  Pacemaker/ICD? ICD  Possible current pregnancy? Hysterectomy  Is the patient on methotrexate? No  Additional Complaints / other details:  -Lives in senior living facility- independently  -Moved to [place] in July 2021

## 2020-02-12 ENCOUNTER — Encounter: Payer: Self-pay | Admitting: Radiation Oncology

## 2020-02-12 ENCOUNTER — Other Ambulatory Visit: Payer: Self-pay

## 2020-02-12 ENCOUNTER — Ambulatory Visit
Admission: RE | Admit: 2020-02-12 | Discharge: 2020-02-12 | Disposition: A | Discharge status: Home / Self Care | Payer: Medicare Other | Source: Ambulatory Visit | Attending: Radiation Oncology | Admitting: Radiation Oncology

## 2020-02-12 VITALS — BP 130/86 | HR 70 | Temp 97.7°F | Resp 18 | Ht 69.0 in | Wt 190.1 lb

## 2020-02-12 DIAGNOSIS — Z79899 Other long term (current) drug therapy: Secondary | ICD-10-CM | POA: Diagnosis not present

## 2020-02-12 DIAGNOSIS — I4891 Unspecified atrial fibrillation: Secondary | ICD-10-CM | POA: Insufficient documentation

## 2020-02-12 DIAGNOSIS — R569 Unspecified convulsions: Secondary | ICD-10-CM | POA: Insufficient documentation

## 2020-02-12 DIAGNOSIS — I712 Thoracic aortic aneurysm, without rupture: Secondary | ICD-10-CM | POA: Diagnosis not present

## 2020-02-12 DIAGNOSIS — I429 Cardiomyopathy, unspecified: Secondary | ICD-10-CM | POA: Insufficient documentation

## 2020-02-12 DIAGNOSIS — C7931 Secondary malignant neoplasm of brain: Secondary | ICD-10-CM | POA: Insufficient documentation

## 2020-02-12 DIAGNOSIS — Z7901 Long term (current) use of anticoagulants: Secondary | ICD-10-CM | POA: Insufficient documentation

## 2020-02-12 DIAGNOSIS — I10 Essential (primary) hypertension: Secondary | ICD-10-CM | POA: Insufficient documentation

## 2020-02-12 DIAGNOSIS — C7949 Secondary malignant neoplasm of other parts of nervous system: Secondary | ICD-10-CM

## 2020-02-12 DIAGNOSIS — I509 Heart failure, unspecified: Secondary | ICD-10-CM | POA: Diagnosis not present

## 2020-02-12 DIAGNOSIS — R918 Other nonspecific abnormal finding of lung field: Secondary | ICD-10-CM | POA: Diagnosis not present

## 2020-02-12 DIAGNOSIS — Z923 Personal history of irradiation: Secondary | ICD-10-CM | POA: Diagnosis not present

## 2020-02-12 DIAGNOSIS — I700 Atherosclerosis of aorta: Secondary | ICD-10-CM | POA: Insufficient documentation

## 2020-02-12 DIAGNOSIS — I2510 Atherosclerotic heart disease of native coronary artery without angina pectoris: Secondary | ICD-10-CM | POA: Insufficient documentation

## 2020-02-12 DIAGNOSIS — C641 Malignant neoplasm of right kidney, except renal pelvis: Secondary | ICD-10-CM | POA: Insufficient documentation

## 2020-02-12 DIAGNOSIS — R935 Abnormal findings on diagnostic imaging of other abdominal regions, including retroperitoneum: Secondary | ICD-10-CM | POA: Insufficient documentation

## 2020-02-12 DIAGNOSIS — Z87891 Personal history of nicotine dependence: Secondary | ICD-10-CM | POA: Insufficient documentation

## 2020-02-12 NOTE — Progress Notes (Signed)
Radiation Oncology         [phone] ________________________________  Name: Jacqueline Palmer        MRN: [id number]  Date of Service: 02/12/2020 DOB: [birth year]  WU:[id number], No Pcp Per  Wyatt Portela, MD     REFERRING PHYSICIAN: Wyatt Portela, MD   DIAGNOSIS: Diagnoses of Renal cell carcinoma of right kidney ([place]) and Brain metastasis ([place]) were pertinent to this visit.   HISTORY OF PRESENT ILLNESS: Jacqueline Palmer is a 74 y.o. female seen at the request of Dr. Alen Blew for history of metastatic renal cell carcinoma. The patient apparently was found to have a 7 cm mass in her right kidney in November 2020, she was planning to have surgical resection but this was delayed until meeting with [facility].  She was scheduled for right nephrectomy on August 04, 2019, however came into the emergency room at [facility] on 07/24/2019 with a new onset of seizures.  CT scan in the emergency department revealed a 3.1 x 2 cm mass in her right frontal lobe concerning for metastatic disease versus a separate primary.  She had a mandibular dislocation and subluxation that was reduced.  Further CT imaging showed 3 nodules in the right lung, and she was taken to the operating room on 07/30/2019 for right frontal craniotomy.  Final pathology revealed a metastatic carcinoma consistent with a metastatic renal cell carcinoma.  She met with medical oncology following her recovery, and underwent postop SRS treatment with Dr. Tyrone Nine at [facility] in [place] which she completed in May 2021.  It appeared that she did not have any other further disease, so she did also undergo right nephrectomy on 10/16/2019 revealing a renal cell carcinoma.  1 node was sampled and was negative.  It is unclear what her treatment plan was going to be following this, however she got readmitted due to hydropneumothorax in June 2021 requiring chest tube.  She apparently was discharged to a skilled facility and was to follow-up with medical oncology  as an outpatient.  She has decided to relocate her care to [facility] where she had resided prior to her diagnosis being made.  She has established her care with Dr. Alen Blew, and recent staging imaging on 01/20/2020 revealed no evidence of recurrent or metastatic disease in the abdomen or pelvis, tiny nodules in the lung measuring up to 4 mm, and stigmata of aortic aneurysm.  An MRI of the brain on 02/03/2020 was also performed revealing inferior and lateral to the resection cavity and area of hyperintensity measuring 2 x 1.8 x 2.8 cm there was no midline shift or extra-axial fluid collection.  The study was limited as it was without contrast.  Her case was discussed in multidisciplinary brain oncology conference, and it has been recommended that she undergo an MRI with and without contrast with SRS protocol to determine if this is truly recurrent disease at the surgical site.  She is seen today to discuss this.   PREVIOUS RADIATION THERAPY: Yes    May 2021Postop [place] with Dr. Tyrone Nine in [facility]: We are still requesting details but it appears the patient received 27.5 Gy in 5 fractions to the right surgical cavity.   PAST MEDICAL HISTORY:  Past Medical History:  Diagnosis Date  . Acute kidney injury ([place])   . Atrial fibrillation ([place])   . Cardiomyopathy ([place])   . CHF (congestive heart failure) ([place])   . Chronic a-fib 08/14/2016  . Chronic anticoagulation 08/14/2016  . Coronary artery disease  4v CABG 6/17  . Hypertension   . 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 Thyroid disease        PAST SURGICAL HISTORY: Past Surgical History:  Procedure Laterality Date  . ABDOMINAL HYSTERECTOMY    . CORONARY ARTERY BYPASS GRAFT       FAMILY HISTORY:  Family History  Problem Relation Age of Onset  . Heart attack Mother   . Heart attack Father   . Heart attack Sister   . Breast cancer Neg Hx      SOCIAL HISTORY:  reports that she quit smoking  about 5 years ago. Her smoking use included cigarettes. She has never used smokeless tobacco. She reports that she does not drink alcohol and does not use drugs. The patient is widowed and lives in [place]. 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    ALLERGIES: Avocado, Banana, Plantain, Ace inhibitors, Codeine, Prednisone, Tape, and Latex   MEDICATIONS:  Current Outpatient Medications  Medication Sig Dispense Refill  . acetaminophen (TYLENOL) 500 MG tablet Take 1,000 mg by mouth in the morning and at bedtime.    . bisacodyl (DULCOLAX) 5 MG EC tablet Take 1-2 tablets (5-10 mg total) by mouth daily as needed for moderate constipation. (Patient not taking: Reported on 01/08/2020) 30 tablet 0  . colchicine 0.6 MG tablet Take 1 tablet (0.6 mg total) by mouth 2 (two) times daily. (Patient taking differently: Take 0.3 mg by mouth every other day. ) 14 tablet 0  . docusate sodium (COLACE) 100 MG capsule Take 1 capsule (100 mg total) by mouth daily as needed for mild constipation or moderate constipation. (Patient not taking: Reported on 01/08/2020) 30 capsule 0  . hydrALAZINE (APRESOLINE) 10 MG tablet Take 10 mg by mouth 3 (three) times daily.     Marland Kitchen levETIRAcetam (KEPPRA) 500 MG tablet Take 1 tablet (500 mg total) by mouth 2 (two) times daily. 60 tablet 0  . levothyroxine (SYNTHROID, LEVOTHROID) 25 MCG tablet Take 25 mcg by mouth daily before breakfast.    . metoprolol succinate (TOPROL-XL) 25 MG 24 hr tablet Take 50 mg by mouth daily.    . nitroGLYCERIN (NITROSTAT) 0.4 MG SL tablet Place 0.4 mg under the tongue every 5 (five) minutes as needed for chest pain.     . polyethylene glycol (MIRALAX / GLYCOLAX) 17 g packet Take 17 g by mouth daily as needed for mild constipation.    . potassium chloride (KLOR-CON) 10 MEQ tablet Take 1 tablet (10 mEq total) by mouth daily for 4 days. 4 tablet 0  . pravastatin (PRAVACHOL) 20 MG tablet Take 20 mg by mouth daily.     Marland Kitchen spironolactone (ALDACTONE) 25 MG tablet Take 25 mg by mouth daily.    Marland Kitchen torsemide (DEMADEX) 20 MG tablet Take 40 mg by mouth daily.    Marland Kitchen warfarin (COUMADIN) 5 MG tablet Take 1 tablet (5 mg total) by mouth See admin instructions. 2.60m Mon, Tues Thurs, Sat and 536mWed, Fri, Sunday (Patient taking differently: Take 5 mg by mouth every evening. ) 30 tablet 1   No current facility-administered medications for this encounter.     REVIEW OF SYSTEMS: On review of systems, the patient reports that she has had a headache that is mild once a week or so. She denies any changes in speech or hearing. She reports at night time when laying flat she has had several episodes of twitching of  her feet. She denies any loss of consciousness. She reports she has had some fullness in her right abdominal wall that is uncomfortable at times. She notes this when turning over in bed and with valsalva. No other complaints are noted.    PHYSICAL EXAM:  Wt Readings from Last 3 Encounters:  02/12/20 190 lb 3.2 oz (86.3 kg)  02/12/20 190 lb 2 oz (86.2 kg)  01/15/20 190 lb (86.2 kg)   Temp Readings from Last 3 Encounters:  02/12/20 97.7 F (36.5 C)  02/12/20 97.7 F (36.5 C) (Oral)  01/15/20 97.6 F (36.4 C) (Oral)   BP Readings from Last 3 Encounters:  02/12/20 130/86  02/12/20 130/86  01/15/20 127/90   Pulse Readings from Last 3 Encounters:  02/12/20 70  02/12/20 70  01/15/20 70   Pain Assessment Pain Score: 0-No pain/10  In general this is a well appearing African American female in no acute distress. She's alert and oriented x4 and appropriate throughout the examination. Cardiopulmonary assessment is negative for acute distress and she exhibits normal effort. Her right abdominal wall has fullness to palpation deep to the skin. No crepitus is noted or specific herniation noted, but there is some questionable laxity.   ECOG = 1  0 - Asymptomatic (Fully active, able to carry on all predisease  activities without restriction)  1 - Symptomatic but completely ambulatory (Restricted in physically strenuous activity but ambulatory and able to carry out work of a light or sedentary nature. For example, light housework, office work)  2 - Symptomatic, <50% in bed during the day (Ambulatory and capable of all self care but unable to carry out any work activities. Up and about more than 50% of waking hours)  3 - Symptomatic, >50% in bed, but not bedbound (Capable of only limited self-care, confined to bed or chair 50% or more of waking hours)  4 - Bedbound (Completely disabled. Cannot carry on any self-care. Totally confined to bed or chair)  5 - Death   Eustace Pen MM, Creech RH, Tormey DC, et al. [phone]). "Toxicity and response criteria of the [facility]". [place] Oncol. 5 (6): 649-55    LABORATORY DATA:  Lab Results  Component Value Date   WBC 6.8 01/20/2020   HGB 11.9 (L) 01/20/2020   HCT 37.0 01/20/2020   MCV 75.2 (L) 01/20/2020   PLT 314 01/20/2020   Lab Results  Component Value Date   NA 140 01/20/2020   K 3.6 01/20/2020   CL 103 01/20/2020   CO2 29 01/20/2020   Lab Results  Component Value Date   ALT 7 01/20/2020   AST 19 01/20/2020   ALKPHOS 75 01/20/2020   BILITOT 0.8 01/20/2020      RADIOGRAPHY: CT ABDOMEN PELVIS WO CONTRAST  Result Date: 01/21/2020 CLINICAL DATA:  Follow-up metastatic renal cell carcinoma. EXAM: CT CHEST, ABDOMEN AND PELVIS WITHOUT CONTRAST TECHNIQUE: Multidetector CT imaging of the chest, abdomen and pelvis was performed following the standard protocol without IV contrast. COMPARISON:  None. FINDINGS: CT CHEST FINDINGS Cardiovascular: Prior CABG and mitral valve replacement. Pacemaker leads in right heart and coronary sinus. Aortic and coronary artery atherosclerosis noted. Ascending thoracic aortic aneurysm is seen measuring 4.5 cm in maximum diameter. Mediastinum/Lymph Nodes: No masses or pathologically enlarged lymph  nodes identified on this unenhanced exam. Lungs/Pleura: A few tiny scattered pulmonary nodules are seen bilaterally, largest in the right middle lobe measuring 4 mm on image 93/6. Mild pleural-parenchymal scarring is seen in both lower  lobes. No evidence of pulmonary infiltrate or pleural effusion. Musculoskeletal:  No suspicious bone lesions identified. CT ABDOMEN AND PELVIS FINDINGS Hepatobiliary: No masses visualized on this unenhanced exam. Gallbladder is unremarkable. No evidence of biliary ductal dilatation. Pancreas: No mass or inflammatory changes identified on this unenhanced exam. Spleen:  Within normal limits in size. Adrenals/Urinary Tract: Postop changes from previous right nephrectomy noted. Unremarkable unenhanced appearance of the left kidney. No evidence of ureteral calculi or hydronephrosis. Unremarkable unopacified urinary bladder. Stomach/Bowel: No evidence of obstruction, inflammatory process, or abnormal fluid collections. Normal appendix visualized. Vascular/Lymphatic: No pathologically enlarged lymph nodes identified. No abdominal aortic aneurysm. Aortic atherosclerosis noted. Reproductive: Prior hysterectomy noted. Adnexal regions are unremarkable in appearance. Other:  None. Musculoskeletal:  No suspicious bone lesions identified. IMPRESSION: Previous right nephrectomy. No evidence of recurrent or metastatic carcinoma within the abdomen, or pelvis. Tiny indeterminate bilateral pulmonary nodules measuring up to 4 mm. Recommend continued attention on follow-up imaging. 4.5 cm ascending thoracic aortic aneurysm. Recommend semi-annual imaging followup by CTA or MRA and referral to cardiothoracic surgery if not already obtained. This recommendation follows 2010 ACCF/AHA/AATS/ACR/ASA/SCA/SCAI/SIR/STS/SVM Guidelines for the Diagnosis and Management of Patients With Thoracic Aortic Disease. Circulation. 2010; 121: I951-O841. Aortic aneurysm NOS (ICD10-I71.9) Electronically Signed   By: Marlaine Hind  M.D.   On: 01/21/2020 09:07   DG Chest 1 View  Result Date: 02/03/2020 CLINICAL DATA:  Assess for abandoned lead. EXAM: CHEST  1 VIEW COMPARISON:  08/09/2016.  CT chest 01/20/2020. FINDINGS: Previous median sternotomy, CABG and mitral valve replacement. Pacemaker/AICD in place. There is a right ventricular lead/AICD. There is a right atrial lead. There is probably a coronary sinus lead. I cannot determine if all 3 are functional based on this single view. IMPRESSION: Previous median sternotomy and CABG. Previous mitral valve replacement. Pacemaker/AICD in place. Right atrial lead, right ventricular lead/AICD, and apparent coronary sinus lead. I cannot determine if all 3 are functional based on this single view. Electronically Signed   By: Nelson Chimes M.D.   On: 02/03/2020 11:50   CT Head Wo Contrast  Result Date: 01/15/2020 CLINICAL DATA:  Seizure. EXAM: CT HEAD WITHOUT CONTRAST TECHNIQUE: Contiguous axial images were obtained from the base of the skull through the vertex without intravenous contrast. COMPARISON:  None. FINDINGS: Brain: Right frontal cortical low density is noted which most likely represents postoperative change. No definite hemorrhage is noted. No midline shift is noted. Ventricular size is within normal limits. Vascular: No hyperdense vessel or unexpected calcification. Skull: Status post right frontal craniotomy. No acute osseous abnormality is noted. Sinuses/Orbits: No acute finding. Other: None. IMPRESSION: Right frontal cortical low density is noted which most likely represents postoperative change given the presence of overlying right frontal craniotomy; correlation with patient's history is recommended. No definite hemorrhage is noted. Electronically Signed   By: Marijo Conception M.D.   On: 01/15/2020 16:39   CT Chest Wo Contrast  Result Date: 01/21/2020 CLINICAL DATA:  Follow-up metastatic renal cell carcinoma. EXAM: CT CHEST, ABDOMEN AND PELVIS WITHOUT CONTRAST TECHNIQUE:  Multidetector CT imaging of the chest, abdomen and pelvis was performed following the standard protocol without IV contrast. COMPARISON:  None. FINDINGS: CT CHEST FINDINGS Cardiovascular: Prior CABG and mitral valve replacement. Pacemaker leads in right heart and coronary sinus. Aortic and coronary artery atherosclerosis noted. Ascending thoracic aortic aneurysm is seen measuring 4.5 cm in maximum diameter. Mediastinum/Lymph Nodes: No masses or pathologically enlarged lymph nodes identified on this unenhanced exam. Lungs/Pleura: A few tiny scattered pulmonary  nodules are seen bilaterally, largest in the right middle lobe measuring 4 mm on image 93/6. Mild pleural-parenchymal scarring is seen in both lower lobes. No evidence of pulmonary infiltrate or pleural effusion. Musculoskeletal:  No suspicious bone lesions identified. CT ABDOMEN AND PELVIS FINDINGS Hepatobiliary: No masses visualized on this unenhanced exam. Gallbladder is unremarkable. No evidence of biliary ductal dilatation. Pancreas: No mass or inflammatory changes identified on this unenhanced exam. Spleen:  Within normal limits in size. Adrenals/Urinary Tract: Postop changes from previous right nephrectomy noted. Unremarkable unenhanced appearance of the left kidney. No evidence of ureteral calculi or hydronephrosis. Unremarkable unopacified urinary bladder. Stomach/Bowel: No evidence of obstruction, inflammatory process, or abnormal fluid collections. Normal appendix visualized. Vascular/Lymphatic: No pathologically enlarged lymph nodes identified. No abdominal aortic aneurysm. Aortic atherosclerosis noted. Reproductive: Prior hysterectomy noted. Adnexal regions are unremarkable in appearance. Other:  None. Musculoskeletal:  No suspicious bone lesions identified. IMPRESSION: Previous right nephrectomy. No evidence of recurrent or metastatic carcinoma within the abdomen, or pelvis. Tiny indeterminate bilateral pulmonary nodules measuring up to 4 mm.  Recommend continued attention on follow-up imaging. 4.5 cm ascending thoracic aortic aneurysm. Recommend semi-annual imaging followup by CTA or MRA and referral to cardiothoracic surgery if not already obtained. This recommendation follows 2010 ACCF/AHA/AATS/ACR/ASA/SCA/SCAI/SIR/STS/SVM Guidelines for the Diagnosis and Management of Patients With Thoracic Aortic Disease. Circulation. 2010; 121: O130-Q657. Aortic aneurysm NOS (ICD10-I71.9) Electronically Signed   By: Marlaine Hind M.D.   On: 01/21/2020 09:07   MR Brain Wo Contrast  Result Date: 02/03/2020 CLINICAL DATA:  Urologic cancer, assess treatment response. EXAM: MRI HEAD WITHOUT CONTRAST TECHNIQUE: Multiplanar, multiecho pulse sequences of the brain and surrounding structures were obtained without intravenous contrast. COMPARISON:  01/15/2020 head CT. FINDINGS: Brain: Sequela of right frontal craniotomy for mass resection. Right frontal encephalomalacia and hemosiderin deposition. Thin focal restricted diffusion along the resection cavity margins. Inferior and lateral to the resection cavity there is a 2.0 x 1.8 x 0.8 cm T2 hyperintense lesion overlying the right frontal convexity (7:17, 11:17) which restricts diffusion. There is perilesional SWI signal dropout and intrinsic T1/FLAIR hyperintensity concerning for hemorrhage. Lack of intravenous contrast limits evaluation. No midline shift, ventriculomegaly or extra-axial fluid collection. Mild cerebral atrophy with ex vacuo dilatation. Scattered and confluent supratentorial white matter and pontine T2 hyperintensities. Vascular: Normal flow voids. Skull and upper cervical spine: Sequela of right frontal craniotomy. No focal osseous lesions. Sinuses/Orbits: Normal orbits. Clear paranasal sinuses. No mastoid effusion. Other: None. IMPRESSION: Sequela of right frontal craniotomy for mass resection. Hemorrhagic right frontal convexity mass measuring 2.0 cm is concerning for recurrent disease. Confluent T2  hyperintense supratentorial white matter signal may reflect post treatment sequela. Please note lack of intravenous contrast limits evaluation. These results will be called to the ordering clinician or representative by the Radiologist Assistant, and communication documented in the PACS or [employer]. Electronically Signed   By: Primitivo Gauze M.D.   On: 02/03/2020 14:17       IMPRESSION/PLAN: 1. 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 Dr. Lisbeth Renshaw discusses the pathology findings and reviews the nature of metastatic disease and reviewed the rationale for her treatments to date. Dr. Lisbeth Renshaw reviewed the rationale for repeating his imaging with a 3T MRI to determine if there is concern for recurrent disease. We will discuss her case in brain oncology conference following her MRI that is scheduled for 02/20/20. The patient will follow up with Dr. Alen Blew as well for management of her disease  overall.  2. Seizures at presentation. We will refer her to Dr. Mickeal Skinner for management of Keppra and long term follow up of this. 3. Abdominal wall fullness. Her CT scan does show some inflammatory appearing changes in my opinion. I encouraged her to follow up with urology and we will defer this further to Dr. Alen Blew as well.   In a visit lasting 60 minutes, greater than 50% of the time was spent face to face discussing the patient's condition, in preparation for the discussion, and coordinating the patient's care.   The above documentation reflects my direct findings during this shared patient visit. Please see the separate note by Dr. Lisbeth Renshaw on this date for the remainder of the patient's plan of care.    Carola Rhine, PAC

## 2020-02-16 ENCOUNTER — Telehealth: Payer: Self-pay

## 2020-02-16 NOTE — Telephone Encounter (Signed)
Patient called in for refill of Keppra. Patient states that she called her PCP over the weekend and got no response. Advised patient most offices do not return calls over the weekend. Patient had gotten a message while we were on the phone that her order was ready at [place] patient in the future to reach out to the pharmacy and they can send a message over to her PCP office to get her refill. If she had any other concern to call back to the office.

## 2020-02-17 NOTE — Progress Notes (Signed)
Addendum: The patient's postop SRS course was at [facility] with Dr. Tyrone Nine in May 2021 and received 27.5 Gy in 5 fractions to the surgical cavity of the right frontal lobe.

## 2020-02-17 NOTE — Addendum Note (Signed)
Encounter addended by: Hayden Pedro, PA-C on: 02/17/2020 8:59 AM  Actions taken: Clinical Note Signed

## 2020-02-19 ENCOUNTER — Other Ambulatory Visit: Payer: Self-pay

## 2020-02-19 ENCOUNTER — Ambulatory Visit (INDEPENDENT_AMBULATORY_CARE_PROVIDER_SITE_OTHER): Payer: Medicare Other | Admitting: Cardiology

## 2020-02-19 ENCOUNTER — Ambulatory Visit (INDEPENDENT_AMBULATORY_CARE_PROVIDER_SITE_OTHER): Payer: Medicare Other | Admitting: Pharmacist Clinician (PhC)/ Clinical Pharmacy Specialist

## 2020-02-19 ENCOUNTER — Encounter: Payer: Self-pay | Admitting: Cardiology

## 2020-02-19 VITALS — BP 130/80 | HR 73 | Temp 94.8°F | Ht 68.0 in | Wt 190.6 lb

## 2020-02-19 DIAGNOSIS — C641 Malignant neoplasm of right kidney, except renal pelvis: Secondary | ICD-10-CM

## 2020-02-19 DIAGNOSIS — N184 Chronic kidney disease, stage 4 (severe): Secondary | ICD-10-CM

## 2020-02-19 DIAGNOSIS — Z951 Presence of aortocoronary bypass graft: Secondary | ICD-10-CM | POA: Insufficient documentation

## 2020-02-19 DIAGNOSIS — I482 Chronic atrial fibrillation, unspecified: Secondary | ICD-10-CM | POA: Diagnosis not present

## 2020-02-19 DIAGNOSIS — Z95810 Presence of automatic (implantable) cardiac defibrillator: Secondary | ICD-10-CM

## 2020-02-19 DIAGNOSIS — I10 Essential (primary) hypertension: Secondary | ICD-10-CM

## 2020-02-19 DIAGNOSIS — Z7901 Long term (current) use of anticoagulants: Secondary | ICD-10-CM

## 2020-02-19 DIAGNOSIS — I4821 Permanent atrial fibrillation: Secondary | ICD-10-CM | POA: Insufficient documentation

## 2020-02-19 DIAGNOSIS — I2510 Atherosclerotic heart disease of native coronary artery without angina pectoris: Secondary | ICD-10-CM

## 2020-02-19 DIAGNOSIS — Z952 Presence of prosthetic heart valve: Secondary | ICD-10-CM

## 2020-02-19 DIAGNOSIS — I5032 Chronic diastolic (congestive) heart failure: Secondary | ICD-10-CM | POA: Insufficient documentation

## 2020-02-19 LAB — POCT INR: INR: 1.1 — AB (ref 2.0–3.0)

## 2020-02-19 NOTE — Progress Notes (Signed)
Cardiology Office Note:    Date:  02/19/2020   ID:  Jacqueline Palmer, DOB [birth year], MRN [id number]  PCP:  Patient, No Pcp Per  Cardiologist:  Buford Dresser, MD  Referring MD: Julian Hy, PA*   CC: new patient evaluation for management of multiple cardiovascular conditions  History of Present Illness:    Jacqueline Palmer is a 74 y.o. female with a hx of hypertension, CAD s/p 4V CABG, MAZE, LAA ligation, bioprosthetic MVR (for rheumatic heart disease) 11/17/2015, permanent atrial fibrillation, chronic diastolic heart failure with recovered systolic dysfunction, hypothyroidism, CKD stage 4, metastatic renal cancer s/p radical nephrectomy and craniotomy for resection of brain tumor who is seen as a new consult at the request of Bujanowski, Melissa, PA* for the evaluation and management of multiple cardiovascular conditions.  Note received from [facility], from [place] on 8.4.21.  Notes reviewed in Care Everywhere. Followed by Dr. Westley Gambles at [facility] for BiV-ICD. She is 100% BiV paced. Had one episode of sustained VT terminated by ATP based on last report. Has permanent atrial fibrillation, on coumadin. Appears she had AV node ablation 09/11/2016 at [facility] based on [place].  Note from Dr. Edwin Dada at [facility] reviewed from 11/26/19. Had recently been admitted for acute heart failure and hydropneumothorax. Noted that she was aggressively diuresed in the hospital 10/2019 but not sent home with diuretic in order to protect her remaining kidney. Admitted from clinic for acute heart failure at that visit.  Today: Transferring her care to [facility] [place]. Needs to establish with device clinic here. No shocks from ICD. Taking coumadin nightly, was doing INR checks herself but no longer has supplies. Needs to establish with coumadin clinic here. Pending transfer to primary care at [facility] as well.  Has had several hospitalizations recently, including for a seizure due to her tumor ([facility]) and  heart failure ([facility]). Has been off of valsartan-HCTZ for about three months, wants to know if this needs restarted. Also no longer taking potassium. Doesn't check home BP.  No further issues with swelling significantly. Rare ankle swelling, improves with elevation. Weighs at home have been steady at 190 lbs. Per [facility], last Cr 1.86 01/20/20.  Has not required nitroglycerin in months. Is currently on pravastatin. Does not recall being on any other statin, I will investigate.  Denies chest pain, shortness of breath at rest or with normal exertion. No PND, orthopnea, LE edema or unexpected weight gain. No syncope or palpitations.  Has follow up with neurologist to review recent brain scan.  Past Medical History:  Diagnosis Date  . Acute kidney injury ([place])   . Atrial fibrillation ([place])   . Cardiomyopathy ([place])   . CHF (congestive heart failure) ([place])   . Chronic a-fib ([place]) 08/14/2016  . Chronic anticoagulation 08/14/2016  . Coronary artery disease    4v CABG 6/17  . Hypertension   . 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 Thyroid disease     Past Surgical History:  Procedure Laterality Date  . ABDOMINAL HYSTERECTOMY    . CORONARY ARTERY BYPASS GRAFT    . CRANIOTOMY Right 07/30/2019   Right frontal  . NEPHRECTOMY Right 10/16/2019    Current Medications: Current Outpatient Medications on File Prior to Visit  Medication Sig  . acetaminophen (TYLENOL) 500 MG tablet Take 1,000 mg by mouth in the morning and at bedtime.  . colchicine 0.6 MG tablet Take 1 tablet (0.6 mg total) by mouth 2 (two) times daily. (Patient taking differently: Take 0.3  mg by mouth every other day. )  . hydrALAZINE (APRESOLINE) 10 MG tablet Take 10 mg by mouth 3 (three) times daily.   Marland Kitchen levothyroxine (SYNTHROID, LEVOTHROID) 25 MCG tablet Take 25 mcg by mouth daily before breakfast.  . metoprolol succinate (TOPROL-XL) 25 MG 24 hr tablet Take 50 mg by mouth daily.  .  nitroGLYCERIN (NITROSTAT) 0.4 MG SL tablet Place 0.4 mg under the tongue every 5 (five) minutes as needed for chest pain.   . polyethylene glycol (MIRALAX / GLYCOLAX) 17 g packet Take 17 g by mouth daily as needed for mild constipation.  . pravastatin (PRAVACHOL) 20 MG tablet Take 20 mg by mouth daily.  Marland Kitchen spironolactone (ALDACTONE) 25 MG tablet Take 25 mg by mouth daily.  Marland Kitchen torsemide (DEMADEX) 20 MG tablet Take 40 mg by mouth daily.  Marland Kitchen warfarin (COUMADIN) 5 MG tablet Take 1 tablet (5 mg total) by mouth See admin instructions. 2.5mg  Mon, [place] Thurs, Sat and 5mg  Wed, Fri, Sunday (Patient taking differently: Take 5 mg by mouth every evening. )  . levETIRAcetam (KEPPRA) 500 MG tablet Take 1 tablet (500 mg total) by mouth 2 (two) times daily.   No current facility-administered medications on file prior to visit.     Allergies:   Avocado, Banana, Plantain, Ace inhibitors, Codeine, Prednisone, Tape, and Latex   Social History   Tobacco Use  . Smoking status: Former Smoker    Types: Cigarettes    Quit date: 07/30/2014    Years since quitting: 5.5  . Smokeless tobacco: Never Used  Substance Use Topics  . Alcohol use: No  . Drug use: No    Family History: family history includes Heart attack in her father, mother, and sister. There is no history of Breast cancer.  ROS:   Please see the history of present illness.  Additional pertinent ROS: Constitutional: Negative for chills, fever, night sweats, unintentional weight loss  HENT: Negative for ear pain and hearing loss.   Eyes: Negative for loss of vision and eye pain.  Respiratory: Negative for cough, sputum, wheezing.   Cardiovascular: See HPI. Gastrointestinal: Negative for abdominal pain, melena, and hematochezia.  Genitourinary: Negative for dysuria and hematuria.  Musculoskeletal: Negative for falls and myalgias.  Skin: Negative for itching and rash.  Neurological: Negative for focal weakness, focal sensory changes and loss of  consciousness.  Endo/Heme/Allergies: Does not bruise/bleed easily.     EKGs/Labs/Other Studies Reviewed:    The following studies were reviewed today: CIED follow up 02/10/20 ([facility]) Stable BSC VVIR CRTD battery (5.70yrs) function and lead trends. BIVpacing=100% ([facility]; PM DEPENDENT). Two NSVT episodes. One sustained VT espiode (avg=205bpm) terminated with one burst of ATP. Permanent AFib. OAC=warfarin  Echo 11/03/19 ([facility]) NORMAL LEFT VENTRICULAR FUNCTION WITH MODERATE LVH  MILD RV SYSTOLIC DYSFUNCTION (See above)  VALVULAR REGURGITATION: MILD AR, TRIVIAL MR, MILD PR  PROSTHETIC VALVE(S): BIOPROSTHETIC MV   Compared with prior Echo study on 06/27/2019: TR NOT AS WELL VISUALIZED ON  TODAY'S EXAM DUE TO PATIENTDISCOMFORT.  TR REVERSAL STILL NOTED IN THE HEPATIC VEINS, C/W SEVERE TR   TEE 09/01/2016 ([facility]) Limited study to assess for LA/LAA thrombus pre-ablation/DCCV   1. Small amount of laminar thrombus in tip of LAA, smaller than prior TEE  (02/04/16)  2. Definity used demonstrates flowcommunication into LAA despite previous  ligation attempt and confirms thrombus in tip of LAA  3. No RA/RAA thrombus  4. Severe TR    EKG:  EKG is personally reviewed.  The ekg ordered today demonstrates  afib vs  atypical flutter with biventricular pacing at 73 bpm  Recent Labs: 01/20/2020: ALT 7; BUN 18; Creatinine 1.86; Hemoglobin 11.9; Platelet Count 314; Potassium 3.6; Sodium 140  Recent Lipid Panel No results found for: CHOL, TRIG, HDL, CHOLHDL, VLDL, LDLCALC, LDLDIRECT  Physical Exam:    VS:  BP 130/80   Pulse 73   Temp (!) 94.8 F (34.9 C)   Ht 5\' 8"  (1.727 m)   Wt 190 lb 9.6 oz (86.5 kg)   SpO2 (!) 89%   BMI 28.98 kg/m     Wt Readings from Last 3 Encounters:  02/19/20 190 lb 9.6 oz (86.5 kg)  02/12/20 190 lb 3.2 oz (86.3 kg)  02/12/20 190 lb 2 oz (86.2 kg)    GEN: Well nourished, well developed in no acute distress HEENT: Normal, moist mucous  membranes NECK: No JVD CARDIAC: regular rhythm, normal S1 and S2, no rubs or gallops. No murmurs. VASCULAR: Radial and DP pulses 2+ bilaterally. No carotid bruits RESPIRATORY:  Clear to auscultation without rales, wheezing or rhonchi  ABDOMEN: Soft, non-tender, non-distended MUSCULOSKELETAL:  Ambulates independently SKIN: Warm and dry, no edema NEUROLOGIC:  Alert and oriented x 3. No focal neuro deficits noted. PSYCHIATRIC:  Normal affect    ASSESSMENT:    1. Permanent atrial fibrillation ([place])   2. Chronic anticoagulation   3. Renal cell carcinoma of right kidney ([place])   4. Essential hypertension   5. Coronary artery disease involving native coronary artery of native heart without angina pectoris   6. Hx of CABG   7. Chronic diastolic heart failure ([place])   8. S/P MVR (mitral valve replacement)   9. Biventricular automatic implantable cardioverter defibrillator in situ   10. CKD (chronic kidney disease) stage 4, GFR 15-29 ml/min (HCC)    PLAN:    Chronic diastolic heart failure, with recent hospitalizations at [facility] -euvolemic today -tolerating torsemide and spironolactone -per [facility] discharge summary, was tried on dapagliflozin but was cost prohibitive -has not been on valsartan-HCTZ since June. With single kidney, normal EF and good blood pressure control, would not restart now  Permanent atrial fib/flutter, s/p AV node ablation Sustained VT requiring ATP -CHA2DS2/VAS Stroke Risk Points=5     Points Metrics  1 Has Congestive Heart Failure:  Yes    1 Has Vascular Disease:  Yes   1 Has Hypertension:  Yes   1 Age:  42   0 Has Diabetes:  No   0 Had Stroke:  No  Had TIA:  No  Had thromboembolism:  No   1 Female:  Yes     -had LAA ligation but TEE at [facility] noted prior thrombus and residual flow -on coumadin, will have her establish with coumadin clinic here -has BiV-ICD in place, will refer her to device clinic here  CAD s/p 4V CABG Rheumatic MV disease s/p  MVR Hypercholesterolemia, goal LDL <70 -rare angina, has not required NG in months -no aspirin as she is on coumadin -on pravastatin. Is not sure if she has been on higher intensity statin in the past. Will investigate -last lipids 04/11/19 at [facility], Thcol 128, LDL 64, HDL 49, TG 77 -on metoprolol succinate as antianginal  CKD stage 3-4, with right radical nephrectomy for renal cell carcinoma Hypertension -last BMET at [facility] 12/04/19, Cr 2.0, GFR 24, consistent with stage 4 CKD -on spironolactone, torsemide for volume management -on hydralazine for BP control -if needed, could add imdur or change to carvedilol  -no ACEi/ARB given single kidney and  chronic kidney disease   Cardiac risk counseling and prevention recommendations: -recommend heart healthy/Mediterranean diet, with whole grains, fruits, vegetable, fish, lean meats, nuts, and olive oil. Limit salt. -recommend moderate walking, 3-5 times/week for 30-50 minutes each session. Aim for at least 150 minutes.week. Goal should be pace of 3 miles/hours, or walking 1.5 miles in 30 minutes -recommend avoidance of tobacco products. Avoid excess alcohol.  Plan for follow up: 4 mos (has PCP appt in 2 mos) or sooner PRN  Total time of encounter: 65 minutes total time of encounter, including 29 minutes spent in face-to-face patient care. This time includes coordination of care and counseling regarding multiple cardiovascular conditions above. Remainder of non-face-to-face time involved reviewing chart documents/testing relevant to the patient encounter and documentation in the medical record. Complex medical decision making given multiple comorbidities. Extensive outside records reviewed and summarized.  Buford Dresser, MD, PhD [place]  [facility]   Medication Adjustments/Labs and Tests Ordered: Current medicines are reviewed at length with the patient today.  Concerns regarding medicines are outlined above.  Orders Placed This  Encounter  Procedures  . Ambulatory referral to Cardiac Electrophysiology  . EKG 12-Lead   No orders of the defined types were placed in this encounter.   Patient Instructions  Medication Instructions:  Your Physician recommend you continue on your current medication as directed.    *If you need a refill on your cardiac medications before your next appointment, please call your pharmacy*   Lab Work: None ordered   Testing/Procedures: None ordered     Follow-Up: At [facility], you and your health needs are our priority.  As part of our continuing mission to provide you with exceptional heart care, we have created designated Provider Care Teams.  These Care Teams include your primary Cardiologist (physician) and Advanced Practice Providers (APPs -  Physician Assistants and Nurse Practitioners) who all work together to provide you with the care you need, when you need it.  We recommend signing up for the patient portal called "MyChart".  Sign up information is provided on this After Visit Summary.  MyChart is used to connect with patients for Virtual Visits (Telemedicine).  Patients are able to view lab/test results, encounter notes, upcoming appointments, etc.  Non-urgent messages can be sent to your provider as well.   To learn more about what you can do with MyChart, go to [email or web address].    Your next appointment:   4 month(s)  The format for your next appointment:   In Person  Provider:   Buford Dresser, MD  Your physician recommends that you schedule a follow-up appointment with coumadin clinic ( Pharm D).        Signed, Buford Dresser, MD PhD 02/19/2020 1:02 PM    [place]

## 2020-02-19 NOTE — Patient Instructions (Addendum)
Medication Instructions:  Your Physician recommend you continue on your current medication as directed.    *If you need a refill on your cardiac medications before your next appointment, please call your pharmacy*   Lab Work: None ordered   Testing/Procedures: None ordered     Follow-Up: At [facility], you and your health needs are our priority.  As part of our continuing mission to provide you with exceptional heart care, we have created designated Provider Care Teams.  These Care Teams include your primary Cardiologist (physician) and Advanced Practice Providers (APPs -  Physician Assistants and Nurse Practitioners) who all work together to provide you with the care you need, when you need it.  We recommend signing up for the patient portal called "MyChart".  Sign up information is provided on this After Visit Summary.  MyChart is used to connect with patients for Virtual Visits (Telemedicine).  Patients are able to view lab/test results, encounter notes, upcoming appointments, etc.  Non-urgent messages can be sent to your provider as well.   To learn more about what you can do with MyChart, go to [email or web address].    Your next appointment:   4 month(s)  The format for your next appointment:   In Person  Provider:   Buford Dresser, MD  Your physician recommends that you schedule a follow-up appointment with coumadin clinic ( Pharm D).

## 2020-02-20 ENCOUNTER — Ambulatory Visit (HOSPITAL_COMMUNITY)
Admission: RE | Admit: 2020-02-20 | Discharge: 2020-02-20 | Disposition: A | Discharge status: Home / Self Care | Payer: Medicare Other | Source: Ambulatory Visit | Attending: Radiation Oncology | Admitting: Radiation Oncology

## 2020-02-20 DIAGNOSIS — C7931 Secondary malignant neoplasm of brain: Secondary | ICD-10-CM | POA: Diagnosis present

## 2020-02-20 DIAGNOSIS — C7949 Secondary malignant neoplasm of other parts of nervous system: Secondary | ICD-10-CM | POA: Diagnosis present

## 2020-02-20 IMAGING — MR MR HEAD WO/W CM
14 series · 42 of 48 positions shown · IV contrast (gadavist)
Comparison: MRI of the brain [DATE]

CLINICAL DATA: Brain/CNS neoplasm, staging.  Question progression.

EXAM:
MRI HEAD WITHOUT AND WITH CONTRAST
TECHNIQUE: Multiplanar, multiecho pulse sequences of the brain and surrounding
structures were obtained without and with intravenous contrast.
CONTRAST:  8.5mL GADAVIST GADOBUTROL 1 MMOL/ML IV SOLN

[Series 5: FLAIR · sagittal · 3.0mm · 0.78mm/px · 1 of 34 slices shown (1 of 3)]
[im 1/34]
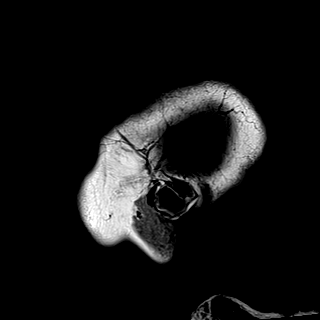

[Series 6: DWI · axial · 3.0mm · 0.88mm/px · z∈[-57,+59]mm · 4 of 84 slices shown (1 of 2)]
[im 1/84]
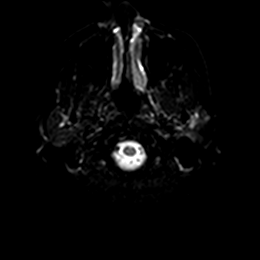
[im 28/84]
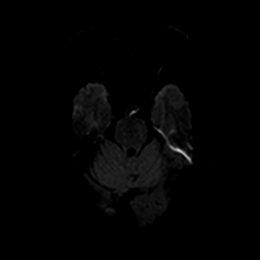
[im 56/84]
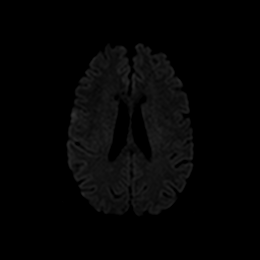
[im 84/84]
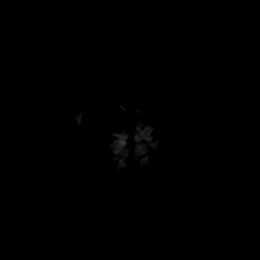

[Series 7: DWI · axial · 3.0mm · 0.88mm/px · z∈[-57,+59]mm · 2 of 42 slices shown (2 of 2)]
[im 1/42]
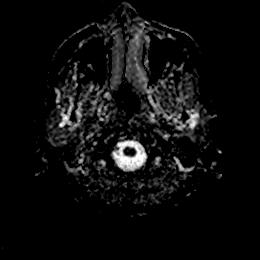
[im 42/42]
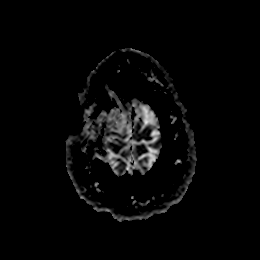

[Series 9: T2 · axial · 5.0mm · 0.78mm/px · 1 of 25 slices shown (1 of 2)]
[im 1/25]
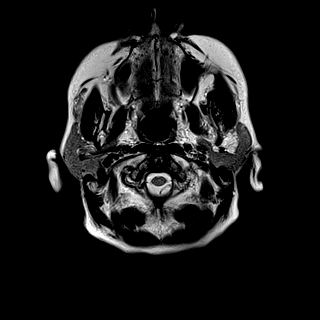

[Series 10: FLAIR · axial · 2.0mm · 0.50mm/px · z∈[-128,+52]mm · 4 of 76 slices shown (2 of 3)]
[im 1/76]
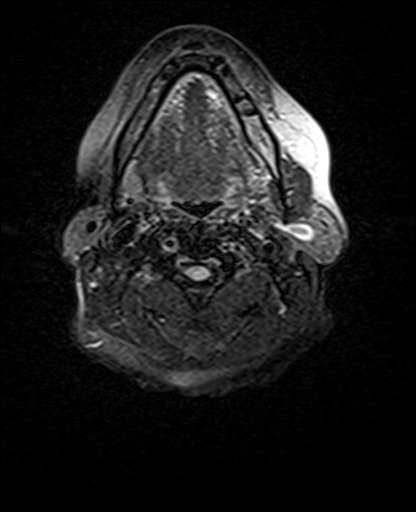
[im 26/76]
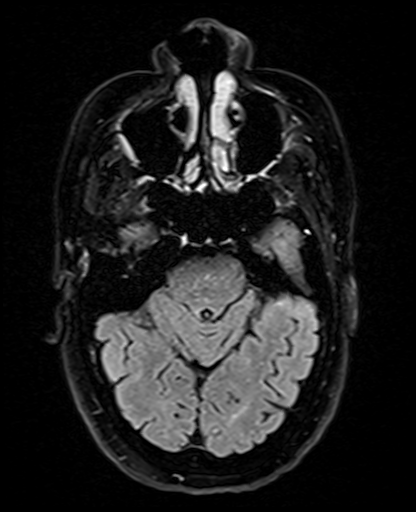
[im 51/76]
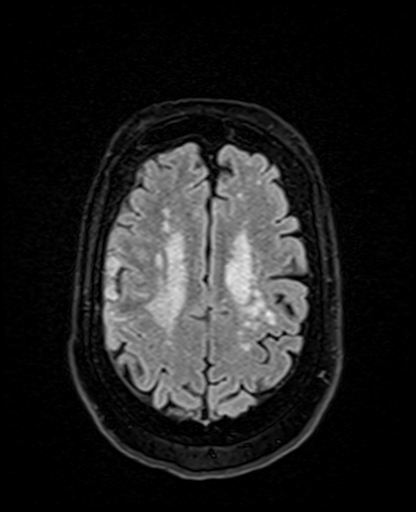
[im 76/76]
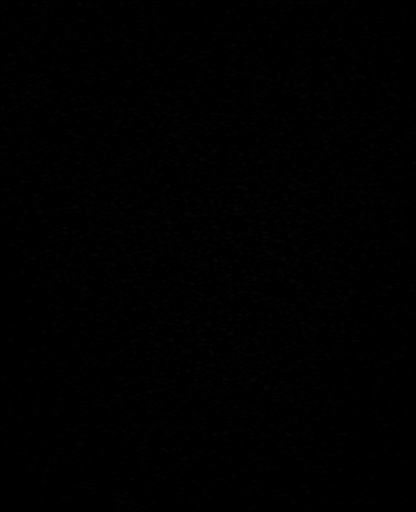

[Series 11: mag_images · axial · 3.0mm · 0.98mm/px · z∈[-73,+94]mm · 3 of 60 slices shown]
[im 1/60]
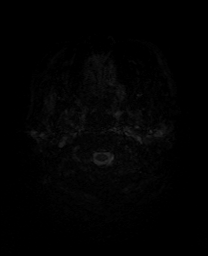
[im 30/60]
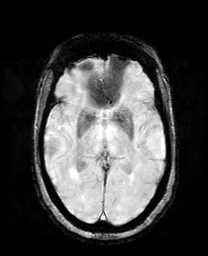
[im 60/60]
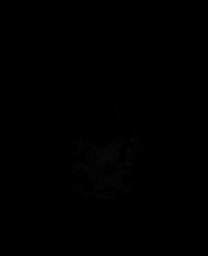

[Series 12: pha_images · axial · 3.0mm · 0.98mm/px · z∈[-73,+94]mm · 3 of 60 slices shown]
[im 1/60]
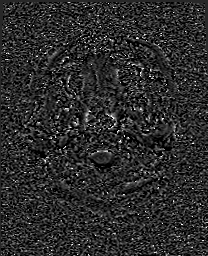
[im 30/60]
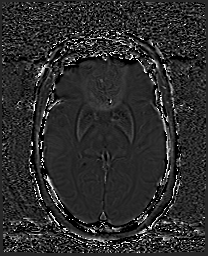
[im 60/60]
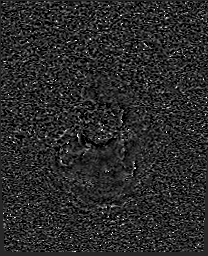

[Series 13: swi_images · axial · 3.0mm · 0.98mm/px · z∈[-73,+94]mm · 3 of 60 slices shown]
[im 1/60]
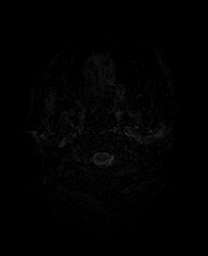
[im 30/60]
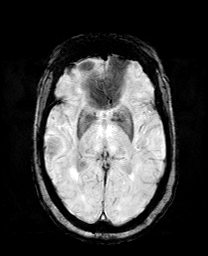
[im 60/60]
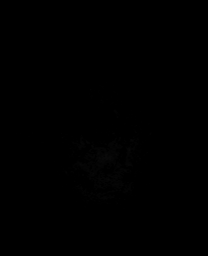

[Series 14: mip_images(sw) · axial · 24.0mm · 0.98mm/px · z∈[-63,+84]mm · 3 of 53 slices shown]
[im 1/53]
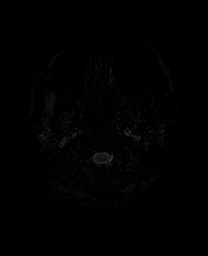
[im 27/53]
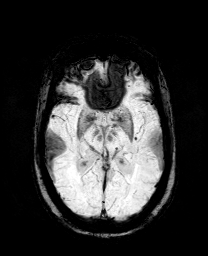
[im 53/53]
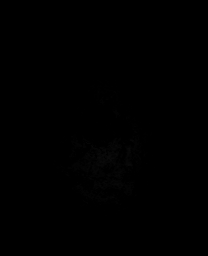

[Series 15: t1_mprage_tra_p2_iso no angle · axial · 1.0mm · 1.00mm/px · z∈[-137,+52]mm · 8 of 190 slices shown]
[im 1/190]
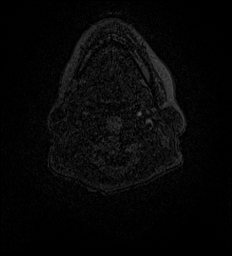
[im 24/190]
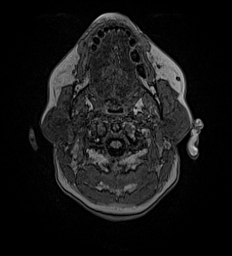
[im 48/190]
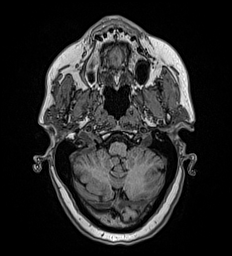
[im 71/190]
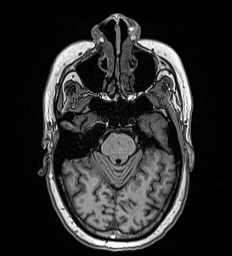
[im 119/190]
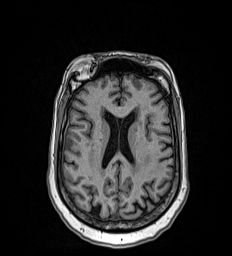
[im 142/190]
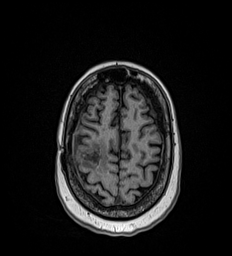
[im 166/190]
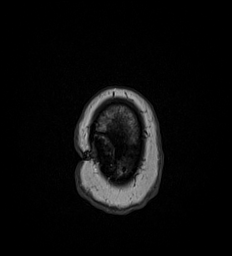
[im 190/190]
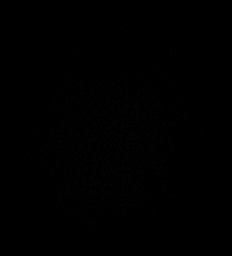

[Series 16: T2 · coronal · 3.0mm · 0.34mm/px · 2 of 50 slices shown (2 of 2)]
[im 1/50]
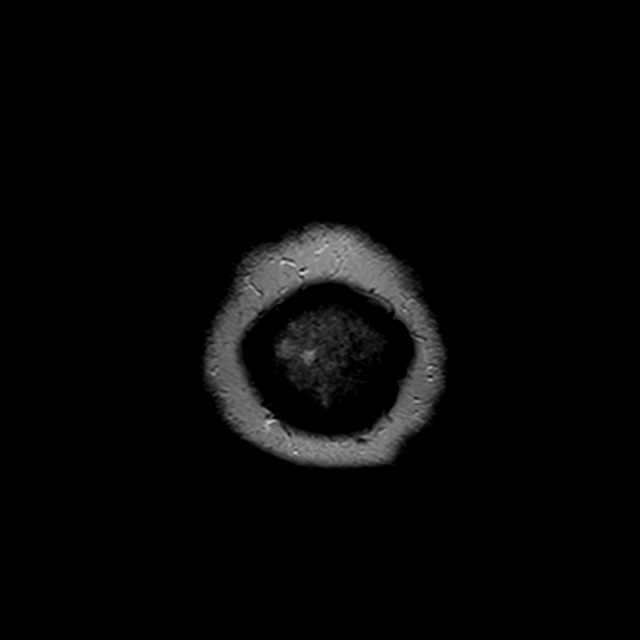
[im 50/50]
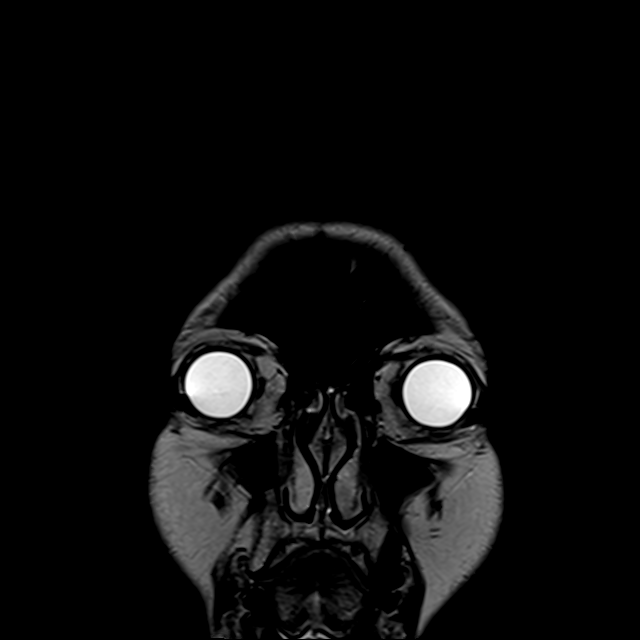

[Series 17: t1_mprage_tra_p2_iso · axial · 1.0mm · 0.98mm/px · z∈[-137,-66]mm · 4 of 191 slices shown]
[im 1/191]
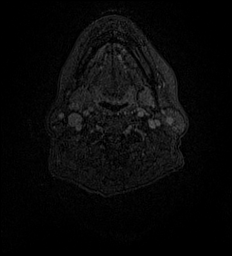
[im 24/191]
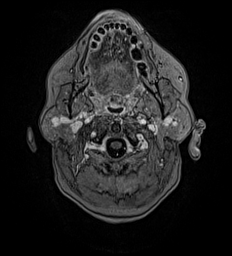
[im 48/191]
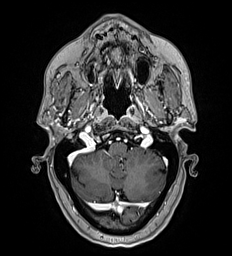
[im 72/191]
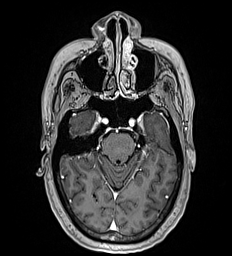

[Series 18: T1 post-contrast · coronal · 3.0mm · 0.34mm/px · 2 of 50 slices shown]
[im 1/50]
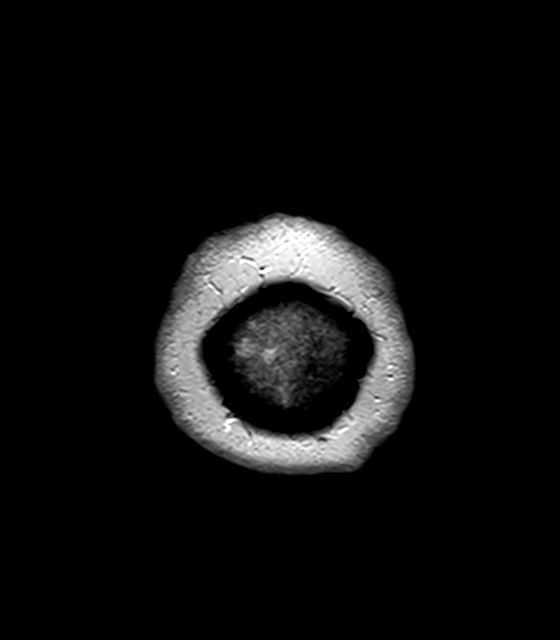
[im 50/50]
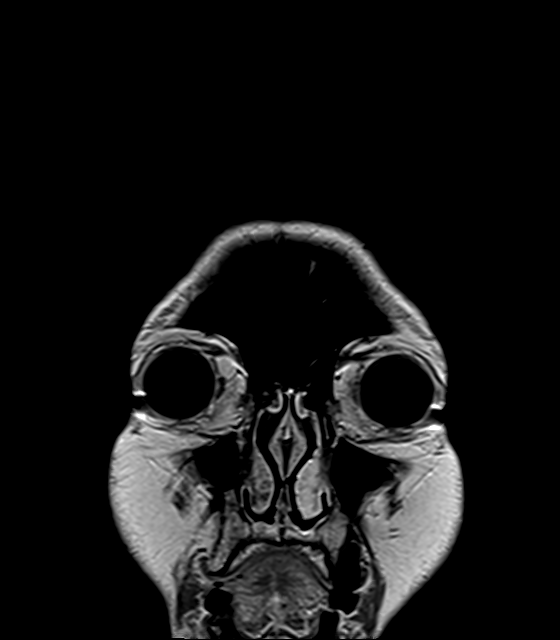

[Series 19: FLAIR · sagittal · 3.0mm · 0.78mm/px · 2 of 34 slices shown (3 of 3)]
[im 1/34]
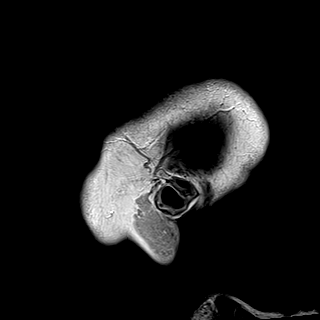
[im 34/34]
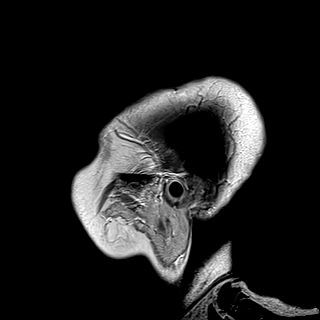

[42 of 48 positions shown; findings below may reference images not displayed]

FINDINGS: Brain: Status post right frontoparietal craniotomy for resection of
posterior right frontal lobe lesion. Surgical cavity with thin
marginal contrast enhancement and surrounding vasogenic in the
posterior right frontal lobe. Subjacent to the area of craniotomy ,
there is diffuse dural thickening and enhancement and a focal
dural-based extra-axial enhancing lesion in the posterior frontal
region measuring approximately 2.4 x 2.3 x 0.9 cm corresponding to
the lesion described on prior MRI. There is enhancement of the
cerebral sulci adjacent to this lesion (series 17, image 131)
concerning for possible focal leptomeningeal spread. There is small
area of subjacent parenchymal edema.

Focal area of cortical edema in posterior right temporal lobe
(series 10, image 30) nonspecific, question cortical infarct.
Scattered confluent foci of T2 hyperintensity are seen white
cerebral hemispheres nonspecific, most likely related to small
vessel ischemia. Leptomeningeal spread.

No acute infarct, large intracranial hemorrhage, hydrocephalus or
extra-axial collection.

Vascular: Normal flow voids.

Skull and upper cervical spine: Postsurgical changes from right
frontoparietal craniotomy. Marrow signal is otherwise maintained.

Sinuses/Orbits: Small dehiscence of the lamina papyracea (series 15
image 91, 92). Paranasal sinuses are clear.

Other: A 2.1 x 1.5 cm T2 hyperintense lesion in the left parotid
gland with progressive contrast enhancement. Question venous
malformation.
IMPRESSION: 1. Postsurgical changes from right frontal lobe lesion resection.
Subjacent to the area of craniotomy , there is a dural-based
extra-axial mass lesion measuring approximately 2.4 x 2.3 x 0.9 cm,
concerning metastatic disease with focal leptomeningeal spread. No
other intracranial focal lesion identified.
2. A 2.1 x 1.5 cm T2 hyperintense lesion in the left parotid gland
with progressive contrast enhancement. Question venous malformation.

## 2020-02-20 MED ORDER — GADOBUTROL 1 MMOL/ML IV SOLN
8.5000 mL | Freq: Once | INTRAVENOUS | Status: AC | PRN
  Administered 2020-02-20: 8.5 mL via INTRAVENOUS

## 2020-02-20 NOTE — Progress Notes (Signed)
Patient re-programmed back to original pacer settings. [place] a screenshot of the pacer settings. Patient taken out via wheelchair.

## 2020-02-20 NOTE — Progress Notes (Signed)
Patient here today for MRI brain with contrast. Patient has Chiropractor. Tele conference performed with [place] Rep. Patient placed in MRI protection mode VOO at 80 BPM. Renee- Cards PA notified of settings. Will re-program patient post MRI scan

## 2020-02-23 ENCOUNTER — Inpatient Hospital Stay: Payer: Medicare Other | Attending: Oncology

## 2020-02-23 DIAGNOSIS — I509 Heart failure, unspecified: Secondary | ICD-10-CM | POA: Insufficient documentation

## 2020-02-23 DIAGNOSIS — Z79899 Other long term (current) drug therapy: Secondary | ICD-10-CM | POA: Insufficient documentation

## 2020-02-23 DIAGNOSIS — C7931 Secondary malignant neoplasm of brain: Secondary | ICD-10-CM | POA: Insufficient documentation

## 2020-02-23 DIAGNOSIS — Z7901 Long term (current) use of anticoagulants: Secondary | ICD-10-CM | POA: Insufficient documentation

## 2020-02-23 DIAGNOSIS — Z951 Presence of aortocoronary bypass graft: Secondary | ICD-10-CM | POA: Insufficient documentation

## 2020-02-23 DIAGNOSIS — I110 Hypertensive heart disease with heart failure: Secondary | ICD-10-CM | POA: Insufficient documentation

## 2020-02-23 DIAGNOSIS — Z923 Personal history of irradiation: Secondary | ICD-10-CM | POA: Insufficient documentation

## 2020-02-23 DIAGNOSIS — C641 Malignant neoplasm of right kidney, except renal pelvis: Secondary | ICD-10-CM | POA: Insufficient documentation

## 2020-02-23 DIAGNOSIS — Z87891 Personal history of nicotine dependence: Secondary | ICD-10-CM | POA: Insufficient documentation

## 2020-02-23 DIAGNOSIS — R29818 Other symptoms and signs involving the nervous system: Secondary | ICD-10-CM | POA: Insufficient documentation

## 2020-02-23 DIAGNOSIS — I2510 Atherosclerotic heart disease of native coronary artery without angina pectoris: Secondary | ICD-10-CM | POA: Insufficient documentation

## 2020-02-24 ENCOUNTER — Ambulatory Visit: Payer: Medicare Other | Admitting: Neurology

## 2020-02-24 ENCOUNTER — Other Ambulatory Visit: Payer: Self-pay

## 2020-02-24 ENCOUNTER — Inpatient Hospital Stay (HOSPITAL_BASED_OUTPATIENT_CLINIC_OR_DEPARTMENT_OTHER): Payer: Medicare Other | Admitting: Internal Medicine

## 2020-02-24 VITALS — BP 135/93 | HR 66 | Temp 97.6°F | Resp 20 | Ht 68.0 in | Wt 190.1 lb

## 2020-02-24 DIAGNOSIS — C641 Malignant neoplasm of right kidney, except renal pelvis: Secondary | ICD-10-CM | POA: Diagnosis present

## 2020-02-24 DIAGNOSIS — I110 Hypertensive heart disease with heart failure: Secondary | ICD-10-CM | POA: Diagnosis not present

## 2020-02-24 DIAGNOSIS — I509 Heart failure, unspecified: Secondary | ICD-10-CM | POA: Diagnosis not present

## 2020-02-24 DIAGNOSIS — C7931 Secondary malignant neoplasm of brain: Secondary | ICD-10-CM

## 2020-02-24 DIAGNOSIS — I2510 Atherosclerotic heart disease of native coronary artery without angina pectoris: Secondary | ICD-10-CM | POA: Diagnosis not present

## 2020-02-24 DIAGNOSIS — R29818 Other symptoms and signs involving the nervous system: Secondary | ICD-10-CM | POA: Diagnosis not present

## 2020-02-24 DIAGNOSIS — Z87891 Personal history of nicotine dependence: Secondary | ICD-10-CM | POA: Diagnosis not present

## 2020-02-24 DIAGNOSIS — Z7901 Long term (current) use of anticoagulants: Secondary | ICD-10-CM | POA: Diagnosis not present

## 2020-02-24 DIAGNOSIS — Z923 Personal history of irradiation: Secondary | ICD-10-CM | POA: Diagnosis not present

## 2020-02-24 DIAGNOSIS — Z951 Presence of aortocoronary bypass graft: Secondary | ICD-10-CM | POA: Diagnosis not present

## 2020-02-24 DIAGNOSIS — Z79899 Other long term (current) drug therapy: Secondary | ICD-10-CM | POA: Diagnosis not present

## 2020-02-24 NOTE — Progress Notes (Signed)
[facility] [facility] [place], [place] [address] [phone]   New Patient Evaluation  Date of Service: 02/24/20 Patient Name: Jacqueline Palmer Patient MRN: [id number] Patient DOB: [birth year] Provider: Ventura Sellers, MD  Identifying Statement:  Jacqueline Palmer is a 74 y.o. 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    Referring Provider: Kyung Rudd, MD [address] [place],  [place] [address]  Primary Cancer: Renal Cell Carcinoma, Stage IV  Oncologic History: 07/30/19: Right frontal craniotomy, resection of solitary brain metastasis at [facility] 10/06/19: Post-operative SRS at [facility] (Dr. Tyrone Nine)  History of Present Illness: The patient's records from the referring physician were obtained and reviewed and the patient interviewed to confirm this HPI.  Doshia Dalia presents to review recent changes on brain MRI.  She initially presented in March 2021 with a seizure, described as "left side clenching up, lost awareness for a few minutes".  CNS imaging demonstrated an enhancing right frontal mass, c/w likely metastasis from renal cell carcinoma.  Craniotomy was performed at [facility] and followed with surgical bed radiosurgery at [facility].  She did have one recurrence of seizure activity in late August, at which time she was restarted on Keppra.  Unclear on why this was discontinued.  Recent MRI demonstrated some change and she presents today for treatment recommendations.  No neurologic complaints aside from seizures.  Medications: Current Outpatient Medications on File Prior to Visit  Medication Sig Dispense Refill  . acetaminophen (TYLENOL) 500 MG tablet Take 1,000 mg by mouth in the morning and at bedtime.    . colchicine 0.6 MG tablet Take 1 tablet (0.6 mg total) by mouth 2 (two) times daily. (Patient taking differently: Take 0.3 mg by mouth every other  day. ) 14 tablet 0  . hydrALAZINE (APRESOLINE) 10 MG tablet Take 10 mg by mouth 3 (three) times daily.     Marland Kitchen levETIRAcetam (KEPPRA) 500 MG tablet Take 1 tablet (500 mg total) by mouth 2 (two) times daily. 60 tablet 0  . levothyroxine (SYNTHROID, LEVOTHROID) 25 MCG tablet Take 25 mcg by mouth daily before breakfast.    . metoprolol succinate (TOPROL-XL) 25 MG 24 hr tablet Take 50 mg by mouth daily.    . nitroGLYCERIN (NITROSTAT) 0.4 MG SL tablet Place 0.4 mg under the tongue every 5 (five) minutes as needed for chest pain.     . polyethylene glycol (MIRALAX / GLYCOLAX) 17 g packet Take 17 g by mouth daily as needed for mild constipation.    . pravastatin (PRAVACHOL) 20 MG tablet Take 20 mg by mouth daily.    Marland Kitchen spironolactone (ALDACTONE) 25 MG tablet Take 25 mg by mouth daily.    Marland Kitchen torsemide (DEMADEX) 20 MG tablet Take 40 mg by mouth daily.    Marland Kitchen warfarin (COUMADIN) 5 MG tablet Take 1 tablet (5 mg total) by mouth See admin instructions. 2.5mg  Mon, Tues Thurs, Sat and 5mg  Wed, Fri, Sunday (Patient taking differently: Take 5 mg by mouth every evening. ) 30 tablet 1   No current facility-administered medications on file prior to visit.    Allergies:  Allergies  Allergen Reactions  . Avocado Shortness Of Breath    Before 2007  . Banana Anaphylaxis and Swelling  . Plantain Anaphylaxis and Swelling  . Ace Inhibitors Cough  . Codeine Other (See Comments)    Nausea and Feels Jittery  . Prednisone Swelling  Of face and lower extremities  . Tape Hives    Tape  Does better with paper tape  Tape  Does better with paper tape    . Latex Rash   Past Medical History:  Past Medical History:  Diagnosis Date  . Acute kidney injury ([place])   . Atrial fibrillation ([place])   . Cardiomyopathy ([place])   . CHF (congestive heart failure) ([place])   . Chronic a-fib ([place]) 08/14/2016  . Chronic anticoagulation 08/14/2016  . Coronary artery disease    4v CABG 6/17  . Hypertension   . 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 Thyroid disease    Past Surgical History:  Past Surgical History:  Procedure Laterality Date  . ABDOMINAL HYSTERECTOMY    . CORONARY ARTERY BYPASS GRAFT    . CRANIOTOMY Right 07/30/2019   Right frontal  . NEPHRECTOMY Right 10/16/2019   Social History:  Social History   Socioeconomic History  . Marital status: Widowed    Spouse name: Not on file  . Number of children: Not on file  . Years of education: Not on file  . Highest education level: Not on file  Occupational History  . Not on file  Tobacco Use  . Smoking status: Former Smoker    Types: Cigarettes    Quit date: 07/30/2014    Years since quitting: 5.5  . Smokeless tobacco: Never Used  Substance and Sexual Activity  . Alcohol use: No  . Drug use: No  . Sexual activity: Not on file  Other Topics Concern  . Not on file  Social History Narrative  . Not on file   Social Determinants of Health   Financial Resource Strain:   . Difficulty of Paying Living Expenses: Not on file  Food Insecurity:   . Worried About Charity fundraiser in the Last Year: Not on file  . Ran Out of Food in the Last Year: Not on file  Transportation Needs:   . Lack of Transportation (Medical): Not on file  . Lack of Transportation (Non-Medical): Not on file  Physical Activity:   . Days of Exercise per Week: Not on file  . Minutes of Exercise per Session: Not on file  Stress:   . Feeling of Stress : Not on file  Social Connections:   . Frequency of Communication with Friends and Family: Not on file  . Frequency of Social Gatherings with Friends and Family: Not on file  . Attends Religious Services: Not on file  . Active Member of Clubs or Organizations: Not on file  . Attends Archivist Meetings: Not on file  . Marital Status: Not on file  Intimate Partner Violence:   . Fear of Current or Ex-Partner: Not on file  . Emotionally Abused: Not on file  . Physically Abused:  Not on file  . Sexually Abused: Not on file   Family History:  Family History  Problem Relation Age of Onset  . Heart attack Mother   . Heart attack Father   . Heart attack Sister   . Breast cancer Neg Hx     Review of Systems: Constitutional: Doesn't report fevers, chills or abnormal weight loss Eyes: Doesn't report blurriness of vision Ears, nose, mouth, throat, and face: Doesn't report sore throat Respiratory: Doesn't report cough, dyspnea or wheezes Cardiovascular: Doesn't report palpitation, chest discomfort  Gastrointestinal:  Doesn't report nausea, constipation, diarrhea GU: Doesn't report incontinence Skin: Doesn't report skin rashes Neurological: Per  HPI Musculoskeletal: Doesn't report joint pain Behavioral/Psych: Doesn't report anxiety  Physical Exam: Vitals:   02/24/20 0941  BP: (!) 135/93  Pulse: 66  Resp: 20  Temp: 97.6 F (36.4 C)  SpO2: 100%   KPS: 80. General: Alert, cooperative, pleasant, in no acute distress Head: Normal EENT: No conjunctival injection or scleral icterus.  Lungs: Resp effort normal Cardiac: Regular rate Abdomen: Non-distended abdomen Skin: No rashes cyanosis or petechiae. Extremities: No clubbing or edema  Neurologic Exam: Mental Status: Awake, alert, attentive to examiner. Oriented to self and environment. Language is fluent with intact comprehension.  Cranial Nerves: Visual acuity is grossly normal. Visual fields are full. Extra-ocular movements intact. No ptosis. Face is symmetric Motor: Tone and bulk are normal. Power is full in both arms and legs. Reflexes are symmetric, no pathologic reflexes present.  Sensory: Intact to light touch Gait: Orthopedic limitations  Labs: I have reviewed the data as listed    Component Value Date/Time   NA 140 01/20/2020 1433   NA 140 08/24/2016 0930   K 3.6 01/20/2020 1433   CL 103 01/20/2020 1433   CO2 29 01/20/2020 1433   GLUCOSE 84 01/20/2020 1433   BUN 18 01/20/2020 1433   BUN 25  08/24/2016 0930   CREATININE 1.86 (H) 01/20/2020 1433   CALCIUM 10.6 (H) 01/20/2020 1433   PROT 7.1 01/20/2020 1433   ALBUMIN 4.0 01/20/2020 1433   AST 19 01/20/2020 1433   ALT 7 01/20/2020 1433   ALKPHOS 75 01/20/2020 1433   BILITOT 0.8 01/20/2020 1433   GFRNONAA 26 (L) 01/20/2020 1433   GFRAA 30 (L) 01/20/2020 1433   Lab Results  Component Value Date   WBC 6.8 01/20/2020   NEUTROABS 3.5 01/20/2020   HGB 11.9 (L) 01/20/2020   HCT 37.0 01/20/2020   MCV 75.2 (L) 01/20/2020   PLT 314 01/20/2020    Imaging:  DG Chest 1 View  Result Date: 02/03/2020 CLINICAL DATA:  Assess for abandoned lead. EXAM: CHEST  1 VIEW COMPARISON:  08/09/2016.  CT chest 01/20/2020. FINDINGS: Previous median sternotomy, CABG and mitral valve replacement. Pacemaker/AICD in place. There is a right ventricular lead/AICD. There is a right atrial lead. There is probably a coronary sinus lead. I cannot determine if all 3 are functional based on this single view. IMPRESSION: Previous median sternotomy and CABG. Previous mitral valve replacement. Pacemaker/AICD in place. Right atrial lead, right ventricular lead/AICD, and apparent coronary sinus lead. I cannot determine if all 3 are functional based on this single view. Electronically Signed   By: Nelson Chimes M.D.   On: 02/03/2020 11:50   MR Brain Wo Contrast  Result Date: 02/03/2020 CLINICAL DATA:  Urologic cancer, assess treatment response. EXAM: MRI HEAD WITHOUT CONTRAST TECHNIQUE: Multiplanar, multiecho pulse sequences of the brain and surrounding structures were obtained without intravenous contrast. COMPARISON:  01/15/2020 head CT. FINDINGS: Brain: Sequela of right frontal craniotomy for mass resection. Right frontal encephalomalacia and hemosiderin deposition. Thin focal restricted diffusion along the resection cavity margins. Inferior and lateral to the resection cavity there is a 2.0 x 1.8 x 0.8 cm T2 hyperintense lesion overlying the right frontal convexity  (7:17, 11:17) which restricts diffusion. There is perilesional SWI signal dropout and intrinsic T1/FLAIR hyperintensity concerning for hemorrhage. Lack of intravenous contrast limits evaluation. No midline shift, ventriculomegaly or extra-axial fluid collection. Mild cerebral atrophy with ex vacuo dilatation. Scattered and confluent supratentorial white matter and pontine T2 hyperintensities. Vascular: Normal flow voids. Skull and upper cervical spine: Sequela of  right frontal craniotomy. No focal osseous lesions. Sinuses/Orbits: Normal orbits. Clear paranasal sinuses. No mastoid effusion. Other: None. IMPRESSION: Sequela of right frontal craniotomy for mass resection. Hemorrhagic right frontal convexity mass measuring 2.0 cm is concerning for recurrent disease. Confluent T2 hyperintense supratentorial white matter signal may reflect post treatment sequela. Please note lack of intravenous contrast limits evaluation. These results will be called to the ordering clinician or representative by the Radiologist Assistant, and communication documented in the PACS or [employer]. Electronically Signed   By: Primitivo Gauze M.D.   On: 02/03/2020 14:17   MR Brain W Wo Contrast  Result Date: 02/20/2020 CLINICAL DATA:  Brain/CNS neoplasm, staging.  Question progression. EXAM: MRI HEAD WITHOUT AND WITH CONTRAST TECHNIQUE: Multiplanar, multiecho pulse sequences of the brain and surrounding structures were obtained without and with intravenous contrast. CONTRAST:  8.57mL GADAVIST GADOBUTROL 1 MMOL/ML IV SOLN COMPARISON:  MRI of the brain February 03, 2020 FINDINGS: Brain: Status post right frontoparietal craniotomy for resection of posterior right frontal lobe lesion. Surgical cavity with thin marginal contrast enhancement and surrounding vasogenic in the posterior right frontal lobe. Subjacent to the area of craniotomy , there is diffuse dural thickening and enhancement and a focal dural-based extra-axial enhancing  lesion in the posterior frontal region measuring approximately 2.4 x 2.3 x 0.9 cm corresponding to the lesion described on prior MRI. There is enhancement of the cerebral sulci adjacent to this lesion (series 17, image 131) concerning for possible focal leptomeningeal spread. There is small area of subjacent parenchymal edema. Focal area of cortical edema in posterior right temporal lobe (series 10, image 30) nonspecific, question cortical infarct. Scattered confluent foci of T2 hyperintensity are seen white cerebral hemispheres nonspecific, most likely related to small vessel ischemia. Leptomeningeal spread. No acute infarct, large intracranial hemorrhage, hydrocephalus or extra-axial collection. Vascular: Normal flow voids. Skull and upper cervical spine: Postsurgical changes from right frontoparietal craniotomy. Marrow signal is otherwise maintained. Sinuses/Orbits: Small dehiscence of the lamina papyracea (series 15 image 91, 92). Paranasal sinuses are clear. Other: A 2.1 x 1.5 cm T2 hyperintense lesion in the left parotid gland with progressive contrast enhancement. Question venous malformation. IMPRESSION: 1. Postsurgical changes from right frontal lobe lesion resection. Subjacent to the area of craniotomy , there is a dural-based extra-axial mass lesion measuring approximately 2.4 x 2.3 x 0.9 cm, concerning metastatic disease with focal leptomeningeal spread. No other intracranial focal lesion identified. 2. A 2.1 x 1.5 cm T2 hyperintense lesion in the left parotid gland with progressive contrast enhancement. Question venous malformation. Electronically Signed   By: Pedro Earls M.D.   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  Lesion is clearly outside of prior  surgical and radiation field.   Case was discussed in brain/spine tumor board, and additionally with her prior radiation oncologist, Benito Mccreedy.    Patient is agreeable to salvage radiosurgery, with plan to treat tumor, leptomeningeal focus, and enhancement dural components.  She understands the risk of this approach is failure to obtain control distally, with further leptomeningeal seeding possible even in the near future.  She is not interested in whole brain radiation at this time due to toxicity concerns.  Recommended continuing Keppra at 500mg  BID; no need for corticosteroids at this time.  We spent twenty additional minutes teaching regarding the natural history,  biology, and historical experience in the treatment of neurologic complications of cancer.   We appreciate the opportunity to participate in the care of Deepa Barthel.  She should return to clinic in 3 months or sooner if needed following post-SRS MRI brain.  All questions were answered. The patient knows to call the clinic with any problems, questions or concerns. No barriers to learning were detected.  The total time spent in the encounter was 40 minutes and more than 50% was on counseling and review of test results   Ventura Sellers, MD Medical Director of Neuro-Oncology [facility] at [place] 02/24/20 3:28 PM

## 2020-02-25 ENCOUNTER — Encounter: Payer: Self-pay | Admitting: Radiation Oncology

## 2020-02-25 ENCOUNTER — Telehealth: Payer: Self-pay | Admitting: Internal Medicine

## 2020-02-25 ENCOUNTER — Other Ambulatory Visit: Payer: Self-pay | Admitting: *Deleted

## 2020-02-25 ENCOUNTER — Telehealth: Payer: Self-pay | Admitting: Radiation Oncology

## 2020-02-25 ENCOUNTER — Other Ambulatory Visit: Payer: Self-pay | Admitting: Radiation Oncology

## 2020-02-25 DIAGNOSIS — C7931 Secondary malignant neoplasm of brain: Secondary | ICD-10-CM

## 2020-02-25 DIAGNOSIS — C641 Malignant neoplasm of right kidney, except renal pelvis: Secondary | ICD-10-CM

## 2020-02-25 NOTE — Progress Notes (Signed)
Has armband been applied?  Yes  Does patient have an allergy to IV contrast dye?: No   Has patient ever received premedication for IV contrast dye?: n/a  Does patient take metformin?: No  If patient does take metformin when was the last dose: n/a  Date of lab work: 02/26/2020 BUN: 25 CR: 2.11 EGfr: 22  IV site: None  Has IV site been added to flowsheet?  N/a  No contrast today.

## 2020-02-25 NOTE — Telephone Encounter (Signed)
Scheduled per 10/5 los. Pt is aware of appt time and date.

## 2020-02-25 NOTE — Telephone Encounter (Signed)
I called the patient to let her know that Dr. Lisbeth Renshaw was in agreement for SRS to the site in the prior resection cavity after reviewing her brain MRI and discussing with Dr. Mickeal Skinner after Dr. Tyrone Nine had been contacted as well. We reviewed risks, benefits, short, and long term effects of therapy and she is in agreement to proceed. She will simulate tomorrow at 11 am.

## 2020-02-26 ENCOUNTER — Ambulatory Visit
Admission: RE | Admit: 2020-02-26 | Discharge: 2020-02-26 | Disposition: A | Discharge status: Home / Self Care | Payer: Medicare Other | Source: Ambulatory Visit | Attending: Radiation Oncology | Admitting: Radiation Oncology

## 2020-02-26 ENCOUNTER — Other Ambulatory Visit: Payer: Self-pay

## 2020-02-26 DIAGNOSIS — Z510 Encounter for antineoplastic radiation therapy: Secondary | ICD-10-CM | POA: Diagnosis present

## 2020-02-26 DIAGNOSIS — C641 Malignant neoplasm of right kidney, except renal pelvis: Secondary | ICD-10-CM

## 2020-02-26 DIAGNOSIS — C649 Malignant neoplasm of unspecified kidney, except renal pelvis: Secondary | ICD-10-CM | POA: Diagnosis not present

## 2020-02-26 DIAGNOSIS — C7931 Secondary malignant neoplasm of brain: Secondary | ICD-10-CM

## 2020-02-26 DIAGNOSIS — N184 Chronic kidney disease, stage 4 (severe): Secondary | ICD-10-CM

## 2020-02-26 LAB — BUN & CREATININE (CHCC)
BUN: 25 mg/dL — ABNORMAL HIGH (ref 8–23)
Creatinine: 2.11 mg/dL — ABNORMAL HIGH (ref 0.44–1.00)
GFR, Estimated: 22 mL/min — ABNORMAL LOW (ref >60)

## 2020-02-26 NOTE — Progress Notes (Signed)
I spoke with the patient today to consent her for [facility] treatment tot he brain. We reviewed her labs show persistent renal impairment which is chronic. Since relocating, she does not have an established relationship with a nephrologist which I think would be wise. She is in agreement. I'll place a referral to Dr. Marval Regal at [facility]. We will proceed with simulation without IV contrast.

## 2020-02-27 ENCOUNTER — Ambulatory Visit: Payer: Medicare Other

## 2020-03-01 ENCOUNTER — Ambulatory Visit: Payer: Medicare Other | Admitting: Radiation Oncology

## 2020-03-01 ENCOUNTER — Ambulatory Visit: Payer: Medicare Other

## 2020-03-02 ENCOUNTER — Other Ambulatory Visit: Payer: Self-pay

## 2020-03-02 ENCOUNTER — Ambulatory Visit: Payer: Medicare Other | Admitting: Radiation Oncology

## 2020-03-02 ENCOUNTER — Ambulatory Visit (INDEPENDENT_AMBULATORY_CARE_PROVIDER_SITE_OTHER): Payer: Medicare Other | Admitting: Pharmacist

## 2020-03-02 DIAGNOSIS — I482 Chronic atrial fibrillation, unspecified: Secondary | ICD-10-CM | POA: Diagnosis not present

## 2020-03-02 DIAGNOSIS — Z7901 Long term (current) use of anticoagulants: Secondary | ICD-10-CM | POA: Diagnosis not present

## 2020-03-02 DIAGNOSIS — Z510 Encounter for antineoplastic radiation therapy: Secondary | ICD-10-CM | POA: Diagnosis not present

## 2020-03-02 LAB — POCT INR: INR: 1.6 — AB (ref 2.0–3.0)

## 2020-03-03 ENCOUNTER — Ambulatory Visit: Payer: Medicare Other | Admitting: Radiation Oncology

## 2020-03-03 ENCOUNTER — Ambulatory Visit
Admission: RE | Admit: 2020-03-03 | Discharge: 2020-03-03 | Disposition: A | Discharge status: Home / Self Care | Payer: Medicare Other | Source: Ambulatory Visit | Attending: Radiation Oncology | Admitting: Radiation Oncology

## 2020-03-03 DIAGNOSIS — Z510 Encounter for antineoplastic radiation therapy: Secondary | ICD-10-CM | POA: Diagnosis not present

## 2020-03-03 NOTE — Progress Notes (Signed)
Patient in post [place] she is doing fine. She resides at a senior living apartment . We discussed what she can expect and also what to call 911 for. Discussed that she may be tired today which is not unusual. Advised to take some ibuprophenif she can to help with any headache she may get.

## 2020-03-04 ENCOUNTER — Ambulatory Visit (INDEPENDENT_AMBULATORY_CARE_PROVIDER_SITE_OTHER): Payer: Medicare Other | Admitting: Cardiology

## 2020-03-04 ENCOUNTER — Ambulatory Visit: Payer: Medicare Other | Admitting: Radiation Oncology

## 2020-03-04 ENCOUNTER — Other Ambulatory Visit: Payer: Self-pay

## 2020-03-04 VITALS — BP 112/86 | HR 70 | Ht 68.0 in | Wt 192.0 lb

## 2020-03-04 DIAGNOSIS — I2510 Atherosclerotic heart disease of native coronary artery without angina pectoris: Secondary | ICD-10-CM

## 2020-03-04 DIAGNOSIS — Z95810 Presence of automatic (implantable) cardiac defibrillator: Secondary | ICD-10-CM

## 2020-03-04 DIAGNOSIS — I428 Other cardiomyopathies: Secondary | ICD-10-CM | POA: Diagnosis not present

## 2020-03-04 DIAGNOSIS — C641 Malignant neoplasm of right kidney, except renal pelvis: Secondary | ICD-10-CM | POA: Diagnosis not present

## 2020-03-04 DIAGNOSIS — Z952 Presence of prosthetic heart valve: Secondary | ICD-10-CM

## 2020-03-04 DIAGNOSIS — C7931 Secondary malignant neoplasm of brain: Secondary | ICD-10-CM

## 2020-03-04 DIAGNOSIS — I4821 Permanent atrial fibrillation: Secondary | ICD-10-CM

## 2020-03-04 DIAGNOSIS — Z951 Presence of aortocoronary bypass graft: Secondary | ICD-10-CM

## 2020-03-04 NOTE — Progress Notes (Signed)
Electrophysiology Office Note:    Date:  03/04/2020   ID:  Jacqueline Palmer, DOB 03-27-1946, MRN [id number]  PCP:  Ladell Pier, MD  [facility] Cardiologist:  Buford Dresser, MD  [place] Electrophysiologist:  None   Referring MD: Buford Dresser,*   Chief Complaint: Nonischemic cardiomyopathy  History of Present Illness:    Jacqueline Palmer is a 74 y.o. female who presents for an evaluation of nonischemic cardiomyopathy at the request of Dr. Harrell Gave. Their medical history includes nonischemic cardiomyopathy, metastatic renal cell carcinoma, hypertension, permanent atrial fibrillation status post AV junction ablation.  She is recently moved to the area after leaving her previous housing situation because of the owners unwillingness to receive the Covid vaccine.  She has family in this area and she is transitioning her care to new providers.  She has never received a shock from her device.      Past Medical History:  Diagnosis Date  . Acute kidney injury ([place])   . Atrial fibrillation ([place])   . Cardiomyopathy ([place])   . CHF (congestive heart failure) ([place])   . Chronic a-fib ([place]) 08/14/2016  . Chronic anticoagulation 08/14/2016  . Coronary artery disease    4v CABG 6/17  . Hypertension   . 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 Thyroid disease     Past Surgical History:  Procedure Laterality Date  . ABDOMINAL HYSTERECTOMY    . CORONARY ARTERY BYPASS GRAFT    . CRANIOTOMY Right 07/30/2019   Right frontal  . NEPHRECTOMY Right 10/16/2019    Current Medications: Current Meds  Medication Sig  . acetaminophen (TYLENOL) 500 MG tablet Take 1,000 mg by mouth in the morning and at bedtime.  . colchicine 0.6 MG tablet Take 1 tablet (0.6 mg total) by mouth 2 (two) times daily. (Patient taking differently: Take 0.3 mg by mouth every other day. )  . hydrALAZINE (APRESOLINE) 10 MG tablet Take 10 mg by mouth 3 (three)  times daily.   Marland Kitchen levETIRAcetam (KEPPRA) 500 MG tablet Take 1 tablet (500 mg total) by mouth 2 (two) times daily.  Marland Kitchen levothyroxine (SYNTHROID, LEVOTHROID) 25 MCG tablet Take 25 mcg by mouth daily before breakfast.  . metoprolol succinate (TOPROL-XL) 25 MG 24 hr tablet Take 50 mg by mouth daily.  . nitroGLYCERIN (NITROSTAT) 0.4 MG SL tablet Place 0.4 mg under the tongue every 5 (five) minutes as needed for chest pain.   . polyethylene glycol (MIRALAX / GLYCOLAX) 17 g packet Take 17 g by mouth daily as needed for mild constipation.  . pravastatin (PRAVACHOL) 20 MG tablet Take 20 mg by mouth daily.  Marland Kitchen spironolactone (ALDACTONE) 25 MG tablet Take 25 mg by mouth daily.  Marland Kitchen torsemide (DEMADEX) 20 MG tablet Take 40 mg by mouth daily.  Marland Kitchen warfarin (COUMADIN) 5 MG tablet Take 1 tablet (5 mg total) by mouth See admin instructions. 2.5mg  Mon, Tues Thurs, Sat and 5mg  Wed, Fri, Sunday (Patient taking differently: Take 5 mg by mouth every evening. )     Allergies:   Avocado, Banana, Plantain, Ace inhibitors, Codeine, Prednisone, Tape, and Latex   Social History   Socioeconomic History  . Marital status: Widowed    Spouse name: Not on file  . Number of children: Not on file  . Years of education: Not on file  . Highest education level: Not on file  Occupational History  . Not on file  Tobacco Use  . Smoking status: Former Smoker  Types: Cigarettes    Quit date: 07/30/2014    Years since quitting: 5.6  . Smokeless tobacco: Never Used  Substance and Sexual Activity  . Alcohol use: No  . Drug use: No  . Sexual activity: Not on file  Other Topics Concern  . Not on file  Social History Narrative  . Not on file   Social Determinants of Health   Financial Resource Strain:   . Difficulty of Paying Living Expenses: Not on file  Food Insecurity:   . Worried About Charity fundraiser in the Last Year: Not on file  . Ran Out of Food in the Last Year: Not on file  Transportation Needs:   . Lack of  Transportation (Medical): Not on file  . Lack of Transportation (Non-Medical): Not on file  Physical Activity:   . Days of Exercise per Week: Not on file  . Minutes of Exercise per Session: Not on file  Stress:   . Feeling of Stress : Not on file  Social Connections:   . Frequency of Communication with Friends and Family: Not on file  . Frequency of Social Gatherings with Friends and Family: Not on file  . Attends Religious Services: Not on file  . Active Member of Clubs or Organizations: Not on file  . Attends Archivist Meetings: Not on file  . Marital Status: Not on file     Family History: The patient's family history includes Heart attack in her father, mother, and sister. There is no history of Breast cancer.  ROS:   Please see the history of present illness.    All other systems reviewed and are negative.  EKGs/Labs/Other Studies Reviewed:    The following studies were reviewed today: In person device interrogation, previous records  March 04, 2020 device interrogation personally reviewed Presenting rhythm atrial fibrillation/flutter with BiV pacing Battery longevity estimated at 6 years VVIR 70-1 20 Atrial lead impedance 107 9 ohms, sensing 2.7 mV Ventricular lead impedance 400 ohms, sensing 7.2 mV, pacing threshold 0.5 V at 0.4 ms Left ventricular lead impedance 518 ohms, sensing amplitude 6.4 mV, pacing threshold 0.5 V at 0.4 ms Shocking coil impedance 67 ohms 3 ventricular tacky counter episodes since last assessment in May 2021.  EGM show 2 nonsustained VT episodes and one episode in the VF zone (cycle length around 275 ms).  This episode was treated with ATP which successfully converted back to normal rhythm  EKG:  The ekg ordered today demonstrates atrial flutter with biventricular pacing  Recent Labs: 01/20/2020: ALT 7; Hemoglobin 11.9; Platelet Count 314; Potassium 3.6; Sodium 140 02/26/2020: BUN 25; Creatinine 2.11  Recent Lipid Panel No results  found for: CHOL, TRIG, HDL, CHOLHDL, VLDL, LDLCALC, LDLDIRECT  Physical Exam:    VS:  BP 112/86   Pulse 70   Ht 5\' 8"  (1.727 m)   Wt 192 lb (87.1 kg)   SpO2 96%   BMI 29.19 kg/m     Wt Readings from Last 3 Encounters:  03/04/20 192 lb (87.1 kg)  02/24/20 190 lb 1.6 oz (86.2 kg)  02/19/20 190 lb 9.6 oz (86.5 kg)     GEN:  Well nourished, well developed in no acute distress HEENT: Normal NECK: No JVD; No carotid bruits LYMPHATICS: No lymphadenopathy CARDIAC: RRR, no murmurs, rubs, gallops RESPIRATORY:  Clear to auscultation without rales, wheezing or rhonchi  ABDOMEN: Soft, non-tender, non-distended MUSCULOSKELETAL:  No edema; No deformity  SKIN: Warm and dry NEUROLOGIC:  Alert and oriented x  3 PSYCHIATRIC:  Normal affect   ASSESSMENT:    1. Nonischemic cardiomyopathy ([place])   2. Biventricular automatic implantable cardioverter defibrillator in situ   3. Renal cell carcinoma of right kidney ([place])   4. Brain metastasis ([place])   5. Permanent atrial fibrillation ([place])   6. S/P MVR (mitral valve replacement)   7. Coronary artery disease involving native coronary artery of native heart without angina pectoris   8. Hx of CABG    PLAN:    In order of problems listed above:  1. Nonischemic cardiomyopathy with a biventricular ICD in situ Device well functioning on today's in person device interrogation.  We will get her established in our device clinic and plan to see on an annual basis or sooner as needed.  She has had episodes of ventricular tachycardia treated successfully with ATP schemes.  No changes to her programming or medications today.  2.  Renal cell carcinoma with brain metastases Undergoing treatment for this.  3.  Permanent atrial fibrillation status post AV junction ablation Continue Coumadin for her CHA2DS2-VASc of 5  4.  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  Coronary artery disease status post four-vessel bypass surgery  No ischemic symptoms  Medication Adjustments/Labs and Tests Ordered: Current medicines are reviewed at length with the patient today.  Concerns regarding medicines are outlined above.  Orders Placed This Encounter  Procedures  . EKG 12-Lead   No orders of the defined types were placed in this encounter.    Signed, Lars Mage, MD, [facility]  03/04/2020 1:53 PM    Electrophysiology [place]

## 2020-03-04 NOTE — Patient Instructions (Signed)
Medication Instructions:  Your physician recommends that you continue on your current medications as directed. Please refer to the Current Medication list given to you today.  Labwork: None ordered.  Testing/Procedures: None ordered.  Follow-Up: Your physician wants you to follow-up in: one year with Dr. Quentin Ore.   You will receive a reminder letter in the mail two months in advance. If you don't receive a letter, please call our office to schedule the follow-up appointment.  Remote monitoring is used to monitor your ICD from home.   Device clinic [phone]  Any Other Special Instructions Will Be Listed Below (If Applicable).  If you need a refill on your cardiac medications before your next appointment, please call your pharmacy.

## 2020-03-05 ENCOUNTER — Ambulatory Visit
Admission: RE | Admit: 2020-03-05 | Discharge: 2020-03-05 | Disposition: A | Discharge status: Home / Self Care | Payer: Medicare Other | Source: Ambulatory Visit | Attending: Radiation Oncology | Admitting: Radiation Oncology

## 2020-03-05 ENCOUNTER — Ambulatory Visit: Payer: Medicare Other | Admitting: Radiation Oncology

## 2020-03-05 DIAGNOSIS — Z510 Encounter for antineoplastic radiation therapy: Secondary | ICD-10-CM | POA: Diagnosis not present

## 2020-03-09 ENCOUNTER — Ambulatory Visit
Admission: RE | Admit: 2020-03-09 | Discharge: 2020-03-09 | Disposition: A | Discharge status: Home / Self Care | Payer: Medicare Other | Source: Ambulatory Visit | Attending: Radiation Oncology | Admitting: Radiation Oncology

## 2020-03-09 ENCOUNTER — Encounter: Payer: Self-pay | Admitting: Radiation Oncology

## 2020-03-09 DIAGNOSIS — Z510 Encounter for antineoplastic radiation therapy: Secondary | ICD-10-CM | POA: Diagnosis not present

## 2020-03-09 NOTE — Progress Notes (Signed)
Jacqueline Palmer rested with [name] for 15 minutes following her [place] treatment.  Patient denies headache, dizziness, nausea, diplopia or ringing in the ears. Denies fatigue. Patient without complaints. Understands to avoid strenuous activity for the next 24 hours and call [phone] with needs.   [name]. Quincy Simmonds RN, BSN  BP (!) 133/97 (BP Location: Left Arm, Patient Position: Sitting, Cuff Size: Normal)   Pulse 71   Temp 98.2 F (36.8 C)   Resp 20   SpO2 100%

## 2020-03-10 ENCOUNTER — Other Ambulatory Visit: Payer: Self-pay | Admitting: Radiation Therapy

## 2020-03-11 ENCOUNTER — Ambulatory Visit (INDEPENDENT_AMBULATORY_CARE_PROVIDER_SITE_OTHER): Payer: Medicare Other | Admitting: Pharmacist Clinician (PhC)/ Clinical Pharmacy Specialist

## 2020-03-11 ENCOUNTER — Other Ambulatory Visit: Payer: Self-pay

## 2020-03-11 DIAGNOSIS — I482 Chronic atrial fibrillation, unspecified: Secondary | ICD-10-CM | POA: Diagnosis not present

## 2020-03-11 DIAGNOSIS — Z7901 Long term (current) use of anticoagulants: Secondary | ICD-10-CM | POA: Diagnosis not present

## 2020-03-11 LAB — POCT INR: INR: 1.3 — AB (ref 2.0–3.0)

## 2020-03-11 NOTE — Patient Instructions (Signed)
Take 1.5 tablets Thursday October 21 and Friday October 22, then increase dose to 1 tablet daily.  Repeat INR in 1 week

## 2020-03-17 ENCOUNTER — Telehealth: Payer: Self-pay | Admitting: *Deleted

## 2020-03-17 NOTE — Telephone Encounter (Signed)
Patient to have this appt. with Dr. Merita Norton on 04-01-20 @ 12:30 pm (kidney specialist)

## 2020-03-17 NOTE — Op Note (Signed)
Name: Mouna Yager    MRN: [id number]   Date: 03/03/2020    DOB: 1945-09-06   STEREOTACTIC RADIOSURGERY OPERATIVE NOTE  PRE-OPERATIVE DIAGNOSIS:  Metastatic brain tumor  POST-OPERATIVE DIAGNOSIS:  Same  PROCEDURE:  Stereotactic Radiosurgery  SURGEON:  Consuella Lose, MD  RADIATION ONCOLOGIST: Dr. Kyung Rudd, MD  TECHNIQUE:  The patient underwent a radiation treatment planning session in the radiation oncology simulation suite under the care of the radiation oncology physician and physicist.  I participated closely in the radiation treatment planning afterwards. The patient underwent planning CT which was fused to 3T high resolution MRI with 1 mm axial slices.  These images were fused on the planning system.  We contoured the gross target volumes and subsequently expanded this to yield the Planning Target Volume. I actively participated in the planning process.  I helped to define and review the target contours and also the contours of the optic pathway, eyes, brainstem and selected nearby organs at risk.  All the dose constraints for critical structures were reviewed and compared to AAPM Task Group 101.  The prescription dose conformity was reviewed.  I approved the plan electronically.    Accordingly, Tniyah Nakagawa  was brought to the TrueBeam stereotactic radiation treatment linac and placed in the custom immobilization mask.  The patient was aligned according to the IR fiducial markers with BrainLab Exactrac, then orthogonal x-rays were used in ExacTrac with the 6DOF robotic table and the shifts were made to align the patient  Dorothea Ogle received stereotactic radiosurgery to a prescription dose of 9Gy (1 of 3 fractions) uneventfully.    The detailed description of the procedure is recorded in the radiation oncology procedure note.  I was present for the duration of the procedure.  DISPOSITION:   Following delivery, the patient was transported to nursing in stable condition and  monitored for possible acute effects to be discharged to home in stable condition with follow-up in one month.  Consuella Lose, MD [facility]

## 2020-03-18 ENCOUNTER — Other Ambulatory Visit: Payer: Self-pay

## 2020-03-18 ENCOUNTER — Ambulatory Visit (INDEPENDENT_AMBULATORY_CARE_PROVIDER_SITE_OTHER): Payer: Medicare Other | Admitting: Pharmacist Clinician (PhC)/ Clinical Pharmacy Specialist

## 2020-03-18 DIAGNOSIS — Z7901 Long term (current) use of anticoagulants: Secondary | ICD-10-CM | POA: Diagnosis not present

## 2020-03-18 DIAGNOSIS — I482 Chronic atrial fibrillation, unspecified: Secondary | ICD-10-CM

## 2020-03-18 LAB — POCT INR: INR: 1.4 — AB (ref 2.0–3.0)

## 2020-03-25 ENCOUNTER — Ambulatory Visit (INDEPENDENT_AMBULATORY_CARE_PROVIDER_SITE_OTHER): Payer: Medicare Other | Admitting: Pharmacist

## 2020-03-25 ENCOUNTER — Other Ambulatory Visit: Payer: Self-pay

## 2020-03-25 DIAGNOSIS — I482 Chronic atrial fibrillation, unspecified: Secondary | ICD-10-CM

## 2020-03-25 DIAGNOSIS — Z7901 Long term (current) use of anticoagulants: Secondary | ICD-10-CM

## 2020-03-25 LAB — POCT INR: INR: 2.6 (ref 2.0–3.0)

## 2020-04-06 ENCOUNTER — Telehealth: Payer: Self-pay

## 2020-04-06 NOTE — Telephone Encounter (Signed)
Called and lmom pt to offer to r/s missed appt

## 2020-04-08 ENCOUNTER — Inpatient Hospital Stay: Payer: Medicare Other | Attending: Oncology

## 2020-04-08 ENCOUNTER — Inpatient Hospital Stay (HOSPITAL_BASED_OUTPATIENT_CLINIC_OR_DEPARTMENT_OTHER): Payer: Medicare Other | Admitting: Oncology

## 2020-04-08 ENCOUNTER — Ambulatory Visit (INDEPENDENT_AMBULATORY_CARE_PROVIDER_SITE_OTHER): Payer: Medicare Other | Admitting: Pharmacist Clinician (PhC)/ Clinical Pharmacy Specialist

## 2020-04-08 ENCOUNTER — Other Ambulatory Visit: Payer: Self-pay

## 2020-04-08 VITALS — BP 135/84 | HR 70 | Temp 96.4°F | Resp 18 | Ht 68.0 in | Wt 195.3 lb

## 2020-04-08 DIAGNOSIS — I482 Chronic atrial fibrillation, unspecified: Secondary | ICD-10-CM | POA: Diagnosis not present

## 2020-04-08 DIAGNOSIS — C641 Malignant neoplasm of right kidney, except renal pelvis: Secondary | ICD-10-CM | POA: Diagnosis not present

## 2020-04-08 DIAGNOSIS — Z79899 Other long term (current) drug therapy: Secondary | ICD-10-CM | POA: Insufficient documentation

## 2020-04-08 DIAGNOSIS — Z7901 Long term (current) use of anticoagulants: Secondary | ICD-10-CM | POA: Diagnosis not present

## 2020-04-08 DIAGNOSIS — N2889 Other specified disorders of kidney and ureter: Secondary | ICD-10-CM | POA: Diagnosis not present

## 2020-04-08 DIAGNOSIS — R918 Other nonspecific abnormal finding of lung field: Secondary | ICD-10-CM

## 2020-04-08 DIAGNOSIS — C649 Malignant neoplasm of unspecified kidney, except renal pelvis: Secondary | ICD-10-CM | POA: Diagnosis not present

## 2020-04-08 DIAGNOSIS — I2510 Atherosclerotic heart disease of native coronary artery without angina pectoris: Secondary | ICD-10-CM | POA: Diagnosis not present

## 2020-04-08 DIAGNOSIS — C7931 Secondary malignant neoplasm of brain: Secondary | ICD-10-CM | POA: Diagnosis not present

## 2020-04-08 LAB — CBC WITH DIFFERENTIAL (CANCER CENTER ONLY)
Abs Immature Granulocytes: 0.01 10*3/uL (ref 0.00–0.07)
Basophils Absolute: 0 10*3/uL (ref 0.0–0.1)
Basophils Relative: 1 %
Eosinophils Absolute: 0.2 10*3/uL (ref 0.0–0.5)
Eosinophils Relative: 4 %
HCT: 33.4 % — ABNORMAL LOW (ref 36.0–46.0)
Hemoglobin: 10.9 g/dL — ABNORMAL LOW (ref 12.0–15.0)
Immature Granulocytes: 0 %
Lymphocytes Relative: 37 %
Lymphs Abs: 2.2 10*3/uL (ref 0.7–4.0)
MCH: 24.1 pg — ABNORMAL LOW (ref 26.0–34.0)
MCHC: 32.6 g/dL (ref 30.0–36.0)
MCV: 73.7 fL — ABNORMAL LOW (ref 80.0–100.0)
Monocytes Absolute: 0.8 10*3/uL (ref 0.1–1.0)
Monocytes Relative: 13 %
Neutro Abs: 2.6 10*3/uL (ref 1.7–7.7)
Neutrophils Relative %: 45 %
Platelet Count: 268 10*3/uL (ref 150–400)
RBC: 4.53 MIL/uL (ref 3.87–5.11)
RDW: 14.3 % (ref 11.5–15.5)
WBC Count: 5.8 10*3/uL (ref 4.0–10.5)
nRBC: 0 % (ref 0.0–0.2)

## 2020-04-08 LAB — CMP (CANCER CENTER ONLY)
ALT: 15 U/L (ref 0–44)
AST: 21 U/L (ref 15–41)
Albumin: 3.9 g/dL (ref 3.5–5.0)
Alkaline Phosphatase: 88 U/L (ref 38–126)
Anion gap: 7 (ref 5–15)
BUN: 27 mg/dL — ABNORMAL HIGH (ref 8–23)
CO2: 28 mmol/L (ref 22–32)
Calcium: 9.3 mg/dL (ref 8.9–10.3)
Chloride: 106 mmol/L (ref 98–111)
Creatinine: 2.09 mg/dL — ABNORMAL HIGH (ref 0.44–1.00)
GFR, Estimated: 24 mL/min — ABNORMAL LOW (ref >60)
Glucose, Bld: 74 mg/dL (ref 70–99)
Potassium: 4 mmol/L (ref 3.5–5.1)
Sodium: 141 mmol/L (ref 135–145)
Total Bilirubin: 0.9 mg/dL (ref 0.3–1.2)
Total Protein: 7.1 g/dL (ref 6.5–8.1)

## 2020-04-08 LAB — POCT INR: INR: 1.6 — AB (ref 2.0–3.0)

## 2020-04-08 NOTE — Progress Notes (Signed)
Hematology and Oncology Follow Up Visit  Jacqueline Palmer [id number] 1945/07/03 74 y.o. 04/08/2020 10:14 AM Jacqueline Massed, MD   Principle Diagnosis: 74 year old woman with renal cell carcinoma diagnosed in November 2020.  She developed a stage IV disease with isolated brain metastasis that was documented in January 2021.  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  Prior Therapy:  He status post right frontal craniotomy and resection of a solitary brain metastasis in March 2021.  He received postoperative SRS at [facility].  He status post a radical nephrectomy on Oct 16, 2019 with a T3a N0 disease.  The final pathology showed undetermined subtype with nuclear grade 3.  She is status post repeat stereotactic radiosurgery under the care of by Dr. Lisbeth Renshaw and Dr. Kathyrn Sheriff on March 03, 2020.  This was completed after she developed a 2.0 cm mass of the right frontal lobe indicating recurrent disease on MRI on February 03, 2020.  Current therapy: Active surveillance.  Interim History: Jacqueline Palmer returns today for a follow-up visit.  Since last visit, she underwent underwent staging work-up in September 2021 which showed a 2.0 cm mass in the right frontal lobe indicating CNS recurrent disease without any evidence of systemic metastases.  She underwent SRS treatment which she has tolerated very well.  She denies any headaches or blurry vision.  She denies any seizure activity.  She is able to drive and attends to activities of daily living.     Medications: I have reviewed the patient's current medications.  Current Outpatient Medications  Medication Sig Dispense Refill  . acetaminophen (TYLENOL) 500 MG tablet Take 1,000 mg by mouth in the morning and at bedtime.    . colchicine 0.6 MG tablet Take 1 tablet (0.6 mg total) by mouth 2 (two) times daily. (Patient taking differently: Take 0.3 mg by mouth every other  day. ) 14 tablet 0  . hydrALAZINE (APRESOLINE) 10 MG tablet Take 10 mg by mouth 3 (three) times daily.     Marland Kitchen levETIRAcetam (KEPPRA) 500 MG tablet Take 1 tablet (500 mg total) by mouth 2 (two) times daily. 60 tablet 0  . levothyroxine (SYNTHROID, LEVOTHROID) 25 MCG tablet Take 25 mcg by mouth daily before breakfast.    . metoprolol succinate (TOPROL-XL) 25 MG 24 hr tablet Take 50 mg by mouth daily.    . nitroGLYCERIN (NITROSTAT) 0.4 MG SL tablet Place 0.4 mg under the tongue every 5 (five) minutes as needed for chest pain.     . polyethylene glycol (MIRALAX / GLYCOLAX) 17 g packet Take 17 g by mouth daily as needed for mild constipation.    . pravastatin (PRAVACHOL) 20 MG tablet Take 20 mg by mouth daily.    Marland Kitchen spironolactone (ALDACTONE) 25 MG tablet Take 25 mg by mouth daily.    Marland Kitchen torsemide (DEMADEX) 20 MG tablet Take 40 mg by mouth daily.    Marland Kitchen warfarin (COUMADIN) 5 MG tablet Take 1 tablet (5 mg total) by mouth See admin instructions. 2.5mg  Mon, [place] Thurs, Sat and 5mg  Wed, Fri, Sunday (Patient taking differently: Take 5 mg by mouth every evening. ) 30 tablet 1   No current facility-administered medications for this visit.     Allergies:  Allergies  Allergen Reactions  . Avocado Shortness Of Breath    Before 2007  . Banana Anaphylaxis and Swelling  . Plantain Anaphylaxis and Swelling  . Ace Inhibitors Cough  . Codeine Other (See Comments)    Nausea  and Feels Jittery  . Prednisone Swelling    Of face and lower extremities  . Tape Hives    Tape  Does better with paper tape  Tape  Does better with paper tape    . Latex Rash      Physical Exam: Blood pressure 135/84, pulse 70, temperature (!) 96.4 F (35.8 C), temperature source Tympanic, resp. rate 18, height 5\' 8"  (1.727 m), weight 195 lb 4.8 oz (88.6 kg), SpO2 100 %. ECOG: 1   General appearance: Comfortable appearing without any discomfort Head: Normocephalic without any trauma Oropharynx: Mucous membranes are moist and  pink without any thrush or ulcers. Eyes: Pupils are equal and round reactive to light. Lymph nodes: No cervical, supraclavicular, inguinal or axillary lymphadenopathy.   Heart:regular rate and rhythm.  S1 and S2 without leg edema. Lung: Clear without any rhonchi or wheezes.  No dullness to percussion. Abdomin: Soft, nontender, nondistended with good bowel sounds.  No hepatosplenomegaly. Musculoskeletal: No joint deformity or effusion.  Full range of motion noted. Neurological: No deficits noted on motor, sensory and deep tendon reflex exam. Skin: No petechial rash or dryness.  Appeared moist.     Lab Results: Lab Results  Component Value Date   WBC 5.8 04/08/2020   HGB 10.9 (L) 04/08/2020   HCT 33.4 (L) 04/08/2020   MCV 73.7 (L) 04/08/2020   PLT 268 04/08/2020     Chemistry      Component Value Date/Time   NA 141 04/08/2020 0938   NA 140 08/24/2016 0930   K 4.0 04/08/2020 0938   CL 106 04/08/2020 0938   CO2 28 04/08/2020 0938   BUN 27 (H) 04/08/2020 0938   BUN 25 08/24/2016 0930   CREATININE 2.09 (H) 04/08/2020 0938      Component Value Date/Time   CALCIUM 9.3 04/08/2020 0938   ALKPHOS 88 04/08/2020 0938   AST 21 04/08/2020 0938   ALT 15 04/08/2020 0938   BILITOT 0.9 04/08/2020 5329       Radiological Studies:  Postsurgical changes from right frontal lobe lesion resection. Subjacent to the area of craniotomy , there is a dural-based extra-axial mass lesion measuring approximately 2.4 x 2.3 x 0.9 cm, concerning metastatic disease with focal leptomeningeal spread. No other intracranial focal lesion identified. 2. A 2.1 x 1.5 cm T2 hyperintense lesion in the left parotid gland with progressive contrast enhancement. Question venous malformation.  Impression and Plan:  74 year old woman with:  1.    Kidney cancer diagnosed in October 2020.  She subsequently developed stage IV with CNS disease in February 2021 with a final pathology showed possibly papillary  histology.   The natural course of this disease was reviewed at this time and other than her recent CNS recurrence she does not have any systemic disease.  The role for systemic therapy is limited in this particular histology and would be deferred unless she has measurable disease.  I recommended repeat imaging studies to update her staging in 3 months.  2.  Renal insufficiency: She will continue to follow with nephrology regarding this issue.  Creatinine clearance right now around 25 cc/min.  3.  CNS metastasis: She completed repeat SRS and October 2021.  She continues to follow with Dr. Lisbeth Renshaw and Dr. Mickeal Skinner regarding this issue.  4.  Pain: No recent exacerbation at this time with pain is manageable.  5.  Follow-up: In 3 months for repeat follow-up.  30  minutes were spent on this encounter.  Time was dedicated to  reviewing her disease status, reviewing imaging studies and management options for the future.     Zola Button, MD 11/18/202110:14 AM

## 2020-04-14 ENCOUNTER — Encounter: Payer: Self-pay | Admitting: Radiation Oncology

## 2020-04-19 ENCOUNTER — Ambulatory Visit
Admission: RE | Admit: 2020-04-19 | Discharge: 2020-04-19 | Disposition: A | Discharge status: Home / Self Care | Payer: Medicare Other | Source: Ambulatory Visit | Attending: Radiation Oncology | Admitting: Radiation Oncology

## 2020-04-19 DIAGNOSIS — C641 Malignant neoplasm of right kidney, except renal pelvis: Secondary | ICD-10-CM

## 2020-04-19 DIAGNOSIS — C7931 Secondary malignant neoplasm of brain: Secondary | ICD-10-CM

## 2020-04-19 NOTE — Progress Notes (Signed)
  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 Tyrone Nine in [facility]: The patient received 27.5 Gy in 5 fractions to the right surgical cavity.  Narrative:  The patient was contacted today for routine follow-up. During treatment she did very well with radiotherapy and did not have significant desquamation. She reports she is doing very well. She was able to enjoy Thanksgiving and spent it with her friends and their family. She reports she has been working with speech therapy and doing very well with this. She denies any headaches, changes in movement and specifically denies recent seizure activity. She denies any concerns with her speech, thoughts, or headaches. No other complaints are verbalized.  Impression/Plan: 1.  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 The patient has been doing well since completion of radiotherapy. We discussed that we would be happy to continue to follow her as needed, but she will also continue to follow up with Dr. Alen Blew in medical oncology and Dr. Mickeal Skinner in neuro oncology. I assured her that Dr. Lisbeth Renshaw is also active in participating in discussion for the brain oncology conference where her future MRIs and care will be presented. She requested her records also be sent to her speech therapy team through [employer]. I'll ask our staff to fax her records for their review.    Carola Rhine, PAC

## 2020-04-22 ENCOUNTER — Other Ambulatory Visit: Payer: Self-pay

## 2020-04-22 ENCOUNTER — Telehealth: Payer: Self-pay | Admitting: Radiation Oncology

## 2020-04-22 ENCOUNTER — Ambulatory Visit (INDEPENDENT_AMBULATORY_CARE_PROVIDER_SITE_OTHER): Payer: Medicare Other | Admitting: Pharmacist Clinician (PhC)/ Clinical Pharmacy Specialist

## 2020-04-22 DIAGNOSIS — Z7901 Long term (current) use of anticoagulants: Secondary | ICD-10-CM | POA: Diagnosis not present

## 2020-04-22 DIAGNOSIS — I482 Chronic atrial fibrillation, unspecified: Secondary | ICD-10-CM

## 2020-04-22 LAB — POCT INR: INR: 3.5 — AB (ref 2.0–3.0)

## 2020-04-22 NOTE — Telephone Encounter (Signed)
Faxed patient's records to [facility] with [employer] (patient's skilled nursing facility). Received successful fax notice.

## 2020-05-02 NOTE — Progress Notes (Signed)
  Radiation Oncology         [phone] ________________________________  Name: Jacqueline Palmer MRN: [id number]  Date: 03/09/2020  DOB: [birth year]  End of Treatment Note  Diagnosis:   Stage IV renal cell cancer     Indication for treatment:  palliative       Radiation treatment dates:   03/03/20 - 03/09/20  Site/dose:    ExacTrac, 4 vmat beams max dose = 129.7% PTV1 RtFront52mm  Narrative: The patient tolerated radiation treatment well.   There were no signs of acute toxicity after treatment.  Plan: The patient has completed radiation treatment. The patient will return to radiation oncology clinic for routine followup in one month. I advised the patient to call or return sooner if they have any questions or concerns related to their recovery or treatment. ________________________________  ------------------------------------------------  Jodelle Gross, MD, PhD

## 2020-05-03 ENCOUNTER — Telehealth: Payer: Self-pay

## 2020-05-03 ENCOUNTER — Ambulatory Visit: Payer: Medicare Other | Attending: Internal Medicine | Admitting: Internal Medicine

## 2020-05-03 ENCOUNTER — Other Ambulatory Visit: Payer: Self-pay

## 2020-05-03 ENCOUNTER — Encounter: Payer: Self-pay | Admitting: Internal Medicine

## 2020-05-03 VITALS — BP 137/90 | HR 69 | Temp 97.8°F | Resp 16 | Ht 68.0 in | Wt 194.6 lb

## 2020-05-03 DIAGNOSIS — Z79899 Other long term (current) drug therapy: Secondary | ICD-10-CM | POA: Insufficient documentation

## 2020-05-03 DIAGNOSIS — E663 Overweight: Secondary | ICD-10-CM

## 2020-05-03 DIAGNOSIS — Z885 Allergy status to narcotic agent status: Secondary | ICD-10-CM | POA: Insufficient documentation

## 2020-05-03 DIAGNOSIS — Z905 Acquired absence of kidney: Secondary | ICD-10-CM | POA: Insufficient documentation

## 2020-05-03 DIAGNOSIS — E890 Postprocedural hypothyroidism: Secondary | ICD-10-CM | POA: Diagnosis not present

## 2020-05-03 DIAGNOSIS — Z7901 Long term (current) use of anticoagulants: Secondary | ICD-10-CM | POA: Insufficient documentation

## 2020-05-03 DIAGNOSIS — I10 Essential (primary) hypertension: Secondary | ICD-10-CM

## 2020-05-03 DIAGNOSIS — Z87891 Personal history of nicotine dependence: Secondary | ICD-10-CM | POA: Diagnosis not present

## 2020-05-03 DIAGNOSIS — I2510 Atherosclerotic heart disease of native coronary artery without angina pectoris: Secondary | ICD-10-CM | POA: Diagnosis not present

## 2020-05-03 DIAGNOSIS — I5032 Chronic diastolic (congestive) heart failure: Secondary | ICD-10-CM | POA: Diagnosis not present

## 2020-05-03 DIAGNOSIS — Z951 Presence of aortocoronary bypass graft: Secondary | ICD-10-CM | POA: Insufficient documentation

## 2020-05-03 DIAGNOSIS — Z85528 Personal history of other malignant neoplasm of kidney: Secondary | ICD-10-CM | POA: Diagnosis not present

## 2020-05-03 DIAGNOSIS — Z0000 Encounter for general adult medical examination without abnormal findings: Secondary | ICD-10-CM | POA: Insufficient documentation

## 2020-05-03 DIAGNOSIS — Z8249 Family history of ischemic heart disease and other diseases of the circulatory system: Secondary | ICD-10-CM | POA: Insufficient documentation

## 2020-05-03 DIAGNOSIS — Z95810 Presence of automatic (implantable) cardiac defibrillator: Secondary | ICD-10-CM | POA: Diagnosis not present

## 2020-05-03 DIAGNOSIS — Z923 Personal history of irradiation: Secondary | ICD-10-CM | POA: Insufficient documentation

## 2020-05-03 DIAGNOSIS — Z79890 Hormone replacement therapy (postmenopausal): Secondary | ICD-10-CM | POA: Diagnosis not present

## 2020-05-03 DIAGNOSIS — I4821 Permanent atrial fibrillation: Secondary | ICD-10-CM

## 2020-05-03 DIAGNOSIS — M109 Gout, unspecified: Secondary | ICD-10-CM | POA: Insufficient documentation

## 2020-05-03 DIAGNOSIS — C7931 Secondary malignant neoplasm of brain: Secondary | ICD-10-CM

## 2020-05-03 DIAGNOSIS — Z7689 Persons encountering health services in other specified circumstances: Secondary | ICD-10-CM | POA: Diagnosis not present

## 2020-05-03 DIAGNOSIS — Z888 Allergy status to other drugs, medicaments and biological substances status: Secondary | ICD-10-CM | POA: Diagnosis not present

## 2020-05-03 DIAGNOSIS — N184 Chronic kidney disease, stage 4 (severe): Secondary | ICD-10-CM | POA: Diagnosis not present

## 2020-05-03 DIAGNOSIS — I130 Hypertensive heart and chronic kidney disease with heart failure and stage 1 through stage 4 chronic kidney disease, or unspecified chronic kidney disease: Secondary | ICD-10-CM | POA: Insufficient documentation

## 2020-05-03 NOTE — Telephone Encounter (Signed)
Patient completed paperwork for medical records to be faxed to [facility].

## 2020-05-03 NOTE — Progress Notes (Signed)
Patient ID: Jacqueline Palmer, female    DOB: 1946-05-22  MRN: [id number]  CC: New Patient (Initial Visit)   Subjective: Jacqueline Palmer is a 74 y.o. female who presents for est care Her concerns today include:  Patient with history of CAD with history CABGx4, chronic diastolic CHF/NICM with BV-ICD, HTN, rheumatic heart disease status post MVR replacement, permanent A. 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  Previous PCP was at [facility].  Provider relocated but last seen 10/21.  Presents today to establish care.  HYPERTENSION Currently taking: see medication list.  She is on hydralazine and metoprolol Med Adherence: '[x]'  Yes    '[]'  No Medication side effects: '[]'  Yes    '[x]'  No Adherence with salt restriction: '[x]'  Yes    '[]'  No Home Monitoring?: '[]'  Yes    '[x]'  No have a home blood pressure device. Monitoring Frequency: '[]'  Yes    '[]'  No Home BP results range: '[]'  Yes    '[]'  No SOB? '[x]'  Yes    '[]'  No Chest Pain?: '[x]'  Yes    '[]'  No Leg swelling?: '[x]'  Yes    '[]'  No Headaches?: '[]'  Yes    '[]'  No Dizziness? '[]'  Yes    '[]'  No Comments: incorporates fruits and some veggies into her diet but not much green veggies due to her being on Coumadin.  Does PT at her retirement home 3x/wk  PAF:  INR check Q 2 wks thought cardiology.  Last checked 04/22/2020 No bruising or bleeding.  No palpitations  Renal cell CA with brain mets: Had surgery to remove solitary brain met  at [place] 07/2019.  Followed by radiation. Right nephrotomy 11/2019 at [facility] Had seizure before brain met was discovered.  Last sz 12/2019 when she stopped Keppra.  Started back on it and has not had any further seizures.  Hypothyroidism:  Hx of Graves treated with RAI more than 5 yrs ago. Does not see endocrinologist.  Taking Levothyroxine 25 mcg consistently every morning  Gout:   Colchicine 1/2 tab QOD.  Last flare Friday LT foot.   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  Retired 2015 and moved to [place] to be closer to family.  She does not have any children.  She lives in a retirement community.  Past medical, surgical, family history, social history reviewed and updated. Patient Active Problem List   Diagnosis Date Noted  . Nonischemic cardiomyopathy ([place]) 03/04/2020  . Biventricular automatic implantable cardioverter defibrillator in situ 02/19/2020  . S/P MVR (mitral valve replacement) 02/19/2020  . Chronic diastolic heart failure ([place]) 02/19/2020  . Hx of CABG 02/19/2020  . Coronary artery disease involving native coronary artery of native heart without angina pectoris 02/19/2020  . Essential hypertension 02/19/2020  . Permanent atrial fibrillation ([place]) 02/19/2020  . CKD (chronic kidney disease) stage 4, GFR 15-29 ml/min (HCC) 02/19/2020  . Renal cell carcinoma of right kidney ([place]) 02/12/2020  . Brain metastasis ([place]) 02/12/2020  . Gout attack 08/14/2016  . Chronic a-fib ([place]) 08/14/2016  . Chronic anticoagulation 08/14/2016  . Acute on chronic systolic heart failure ([place]) 08/09/2016     Current Outpatient Medications on File Prior to Visit  Medication Sig Dispense Refill  . acetaminophen (TYLENOL) 500 MG tablet Take 1,000 mg by mouth in the morning and at bedtime.    Marland Kitchen  colchicine 0.6 MG tablet Take 1 tablet (0.6 mg total) by mouth 2 (two) times daily. (Patient taking differently: Take 0.3 mg by mouth every other day. ) 14 tablet 0  . hydrALAZINE (APRESOLINE) 10 MG tablet Take 10 mg by mouth 3 (three) times daily.     Marland Kitchen levETIRAcetam (KEPPRA) 500 MG tablet Take 1 tablet (500 mg total) by mouth 2 (two) times daily. 60 tablet 0  . levETIRAcetam (KEPPRA) 500 MG tablet Take 500 mg by mouth 2 (two) times daily.    Marland Kitchen levothyroxine (SYNTHROID, LEVOTHROID) 25 MCG tablet Take 25 mcg by mouth daily before breakfast.    . metoprolol  succinate (TOPROL-XL) 25 MG 24 hr tablet Take 50 mg by mouth daily.    . nitroGLYCERIN (NITROSTAT) 0.4 MG SL tablet Place 0.4 mg under the tongue every 5 (five) minutes as needed for chest pain.     . polyethylene glycol (MIRALAX / GLYCOLAX) 17 g packet Take 17 g by mouth daily as needed for mild constipation.    . pravastatin (PRAVACHOL) 20 MG tablet Take 20 mg by mouth daily.    Marland Kitchen spironolactone (ALDACTONE) 25 MG tablet Take 25 mg by mouth daily.    Marland Kitchen torsemide (DEMADEX) 20 MG tablet Take 40 mg by mouth daily.    Marland Kitchen warfarin (COUMADIN) 5 MG tablet Take 1 tablet (5 mg total) by mouth See admin instructions. 2.73m Mon, Tues Thurs, Sat and 527mWed, Fri, Sunday (Patient taking differently: Take 5 mg by mouth every evening. ) 30 tablet 1   No current facility-administered medications on file prior to visit.    Allergies  Allergen Reactions  . Avocado Shortness Of Breath    Before 2007  . Banana Anaphylaxis and Swelling  . Plantain Anaphylaxis and Swelling  . Ace Inhibitors Cough  . Codeine Other (See Comments)    Nausea and Feels Jittery  . Prednisone Swelling    Of face and lower extremities  . Tape Hives    Tape  Does better with paper tape  Tape  Does better with paper tape    . Latex Rash    Social History   Socioeconomic History  . Marital status: Widowed    Spouse name: Not on file  . Number of children: 0  . Years of education: Not on file  . Highest education level: Master's degree (e.g., MA, MS, MEng, MEd, MSW, MBA)  Occupational History  . Occupation: Retired chClinical biochemistTobacco Use  . Smoking status: Former Smoker    Types: Cigarettes    Quit date: 07/30/2014    Years since quitting: 5.7  . Smokeless tobacco: Never Used  Vaping Use  . Vaping Use: Never used  Substance and Sexual Activity  . Alcohol use: No  . Drug use: No  . Sexual activity: Not on file  Other Topics Concern  . Not on file  Social History Narrative  . Not on file   Social Determinants of  Health   Financial Resource Strain: Not on file  Food Insecurity: Not on file  Transportation Needs: Not on file  Physical Activity: Not on file  Stress: Not on file  Social Connections: Not on file  Intimate Partner Violence: Not on file    Family History  Problem Relation Age of Onset  . Heart attack Mother   . Heart attack Father   . Heart attack Sister   . Breast cancer Neg Hx     Past Surgical History:  Procedure Laterality Date  .  ABDOMINAL HYSTERECTOMY    . CORONARY ARTERY BYPASS GRAFT    . CRANIOTOMY Right 07/30/2019   Right frontal  . NEPHRECTOMY Right 10/16/2019    ROS: Review of Systems Negative except as stated above  PHYSICAL EXAM: BP 137/90   Pulse 69   Temp 97.8 F (36.6 C)   Resp 16   Ht '5\' 8"'  (1.727 m)   Wt 194 lb 9.6 oz (88.3 kg)   SpO2 100%   BMI 29.59 kg/m   Physical Exam Repeat blood pressure 120/90  General appearance - alert, well appearing, elderly African-American female and in no distress.  She ambulates with a quad cane. Mental status - normal mood, behavior, speech, dress, motor activity, and thought processes Eyes - pupils equal and reactive, extraocular eye movements intact Nose - normal and patent, no erythema, discharge or polyps Mouth - mucous membranes moist, pharynx normal without lesions Neck - supple, no significant adenopathy Chest - clear to auscultation, no wheezes, rales or rhonchi, symmetric air entry Heart -regular rate rhythm with S4 heard at PMI  extremities - peripheral pulses normal, no pedal edema, no clubbing or cyanosis   CMP Latest Ref Rng & Units 04/08/2020 02/26/2020 01/20/2020  Glucose 70 - 99 mg/dL 74 - 84  BUN 8 - 23 mg/dL 27(H) 25(H) 18  Creatinine 0.44 - 1.00 mg/dL 2.09(H) 2.11(H) 1.86(H)  Sodium 135 - 145 mmol/L 141 - 140  Potassium 3.5 - 5.1 mmol/L 4.0 - 3.6  Chloride 98 - 111 mmol/L 106 - 103  CO2 22 - 32 mmol/L 28 - 29  Calcium 8.9 - 10.3 mg/dL 9.3 - 10.6(H)  Total Protein 6.5 - 8.1 g/dL 7.1  - 7.1  Total Bilirubin 0.3 - 1.2 mg/dL 0.9 - 0.8  Alkaline Phos 38 - 126 U/L 88 - 75  AST 15 - 41 U/L 21 - 19  ALT 0 - 44 U/L 15 - 7   Lipid Panel  No results found for: CHOL, TRIG, HDL, CHOLHDL, VLDL, LDLCALC, LDLDIRECT  CBC    Component Value Date/Time   WBC 5.8 04/08/2020 0938   WBC 7.3 01/15/2020 1240   RBC 4.53 04/08/2020 0938   HGB 10.9 (L) 04/08/2020 0938   HCT 33.4 (L) 04/08/2020 0938   PLT 268 04/08/2020 0938   MCV 73.7 (L) 04/08/2020 0938   MCH 24.1 (L) 04/08/2020 0938   MCHC 32.6 04/08/2020 0938   RDW 14.3 04/08/2020 0938   LYMPHSABS 2.2 04/08/2020 0938   MONOABS 0.8 04/08/2020 0938   EOSABS 0.2 04/08/2020 0938   BASOSABS 0.0 04/08/2020 0938    ASSESSMENT AND PLAN:  1. Establishing care with new doctor, encounter for Patient to sign release for me to get her records from [facility].  2. Essential hypertension Not at goal.  Advised patient to check blood pressure at least twice a week with goal being 130/80 or lower.  If she finds that her blood pressure is running consistently higher she will let me know so that we can increase the hydralazine.  DASH diet discussed and encouraged.  3. Postablative hypothyroidism Continue current dose of levothyroxine - TSH  4. Over weight Healthy eating habits discussed and encouraged.  Patient is engaged with physical therapy 3 times a week at her retirement center.  5. History of renal cell cancer 6. Brain metastasis ([place]) Followed by oncologist Dr. Alen Blew  7. CKD (chronic kidney disease) stage 4, GFR 15-29 ml/min (HCC) Followed by [facility]  8. Chronic diastolic heart failure (HCC) Stable  and compensated.  9. Permanent atrial fibrillation ([place]) Followed by cardiology.  On Coumadin.  Levels being followed through cardiology clinic.  Patient was given the opportunity to ask questions.  Patient verbalized understanding of the plan and was able to repeat key elements of the plan.    Orders Placed This Encounter  Procedures  . TSH     Requested Prescriptions    No prescriptions requested or ordered in this encounter    Return in about 3 months (around 08/01/2020) for Sign a release for me to get your records from [facility].  Karle Plumber, MD, FACP

## 2020-05-03 NOTE — Patient Instructions (Signed)
Check your blood pressure at least twice a week with goal being 130/80 or lower.  If you find that you are running consistently higher than that, please let me know so that we can adjust your medication.

## 2020-05-04 LAB — TSH: TSH: 2.94 u[IU]/mL (ref 0.450–4.500)

## 2020-05-06 ENCOUNTER — Telehealth: Payer: Self-pay

## 2020-05-06 ENCOUNTER — Ambulatory Visit (INDEPENDENT_AMBULATORY_CARE_PROVIDER_SITE_OTHER): Payer: Medicare Other | Admitting: Pharmacist

## 2020-05-06 ENCOUNTER — Other Ambulatory Visit: Payer: Self-pay

## 2020-05-06 DIAGNOSIS — I482 Chronic atrial fibrillation, unspecified: Secondary | ICD-10-CM | POA: Diagnosis not present

## 2020-05-06 DIAGNOSIS — Z7901 Long term (current) use of anticoagulants: Secondary | ICD-10-CM | POA: Diagnosis not present

## 2020-05-06 LAB — POCT INR: INR: 2.3 (ref 2.0–3.0)

## 2020-05-06 NOTE — Telephone Encounter (Signed)
Received a fax from [facility] requesting signed orders for speech therapy.  Called facility to inform MD is unable to sign orders and to defer to PCP. Left message to call back.

## 2020-05-27 ENCOUNTER — Telehealth: Payer: Self-pay | Admitting: Internal Medicine

## 2020-05-27 NOTE — Telephone Encounter (Signed)
Copied from [place] [id number]. Topic: General - Inquiry >> May 26, 2020  4:50 PM Gillis Ends D wrote: Reason for CRM: Alan Mulder from [facility] at [facility] called about a fax she sent over for orders for OT for this patient. She called to check on the status. She can be reached at [phone] or fax number [phone]. Please advise >> May 27, 2020  1:07 PM Jodie Echevaria wrote: Alan Mulder from [place] called back to check status of a fax she sent over for orders for OT for this patient. Patient was scheduled to begin her therapy today 05/27/20 but because there was no response to the previous message there have been a delay. Asking Dr Wynetta Emery or a nurse for a response please. She can be reached at [phone] or fax number [phone]. Please advise

## 2020-05-28 NOTE — Telephone Encounter (Signed)
Form has been faxed back today

## 2020-06-01 ENCOUNTER — Ambulatory Visit: Payer: Medicare Other | Admitting: Internal Medicine

## 2020-06-02 ENCOUNTER — Encounter: Payer: Self-pay | Admitting: Internal Medicine

## 2020-06-02 NOTE — Progress Notes (Signed)
I received patient's records from [facility].  Records reviewed.  In regards to health maintenance, she had bone mineral density study done 07/04/2018 which was normal.  She had colonoscopy done 08/03/2017 with polyps.  Will be due again in 5 years.  Last Medicare wellness visit done 12/23/2019.  I did not see any records of her vaccinations.

## 2020-06-03 ENCOUNTER — Ambulatory Visit (INDEPENDENT_AMBULATORY_CARE_PROVIDER_SITE_OTHER): Payer: Medicare Other

## 2020-06-03 DIAGNOSIS — I428 Other cardiomyopathies: Secondary | ICD-10-CM | POA: Diagnosis not present

## 2020-06-03 LAB — CUP PACEART REMOTE DEVICE CHECK
Battery Remaining Longevity: 72 mo
Battery Remaining Percentage: 84 %
Brady Statistic RA Percent Paced: 0 %
Brady Statistic RV Percent Paced: 100 %
Date Time Interrogation Session: 20220113000200
HighPow Impedance: 60 Ohm
Implantable Lead Implant Date: 20180423
Implantable Lead Implant Date: 20180423
Implantable Lead Implant Date: 20180423
Implantable Lead Location: 753858
Implantable Lead Location: 753859
Implantable Lead Location: 753860
Implantable Lead Model: 292
Implantable Lead Model: 4672
Implantable Lead Model: 7740
Implantable Lead Serial Number: 431793
Implantable Lead Serial Number: 602825
Implantable Lead Serial Number: 703305
Implantable Pulse Generator Implant Date: 20180423
Lead Channel Impedance Value: 1051 Ohm
Lead Channel Impedance Value: 371 Ohm
Lead Channel Impedance Value: 476 Ohm
Lead Channel Pacing Threshold Amplitude: 0.6 V
Lead Channel Pacing Threshold Amplitude: 0.7 V
Lead Channel Pacing Threshold Pulse Width: 0.4 ms
Lead Channel Pacing Threshold Pulse Width: 0.4 ms
Lead Channel Setting Pacing Amplitude: 1.6 V
Lead Channel Setting Pacing Amplitude: 2 V
Lead Channel Setting Pacing Pulse Width: 0.4 ms
Lead Channel Setting Pacing Pulse Width: 0.4 ms
Lead Channel Setting Sensing Sensitivity: 0.6 mV
Lead Channel Setting Sensing Sensitivity: 1 mV
Pulse Gen Serial Number: 174046

## 2020-06-08 ENCOUNTER — Telehealth (INDEPENDENT_AMBULATORY_CARE_PROVIDER_SITE_OTHER): Payer: Medicare Other | Admitting: Cardiology

## 2020-06-08 ENCOUNTER — Ambulatory Visit: Payer: Medicare Other | Admitting: Cardiology

## 2020-06-08 ENCOUNTER — Encounter: Payer: Self-pay | Admitting: Cardiology

## 2020-06-08 VITALS — BP 121/80 | Ht 68.0 in | Wt 194.0 lb

## 2020-06-08 DIAGNOSIS — Z7901 Long term (current) use of anticoagulants: Secondary | ICD-10-CM | POA: Diagnosis not present

## 2020-06-08 DIAGNOSIS — Z952 Presence of prosthetic heart valve: Secondary | ICD-10-CM

## 2020-06-08 DIAGNOSIS — Z951 Presence of aortocoronary bypass graft: Secondary | ICD-10-CM

## 2020-06-08 DIAGNOSIS — Z95810 Presence of automatic (implantable) cardiac defibrillator: Secondary | ICD-10-CM

## 2020-06-08 DIAGNOSIS — I482 Chronic atrial fibrillation, unspecified: Secondary | ICD-10-CM

## 2020-06-08 DIAGNOSIS — I5032 Chronic diastolic (congestive) heart failure: Secondary | ICD-10-CM

## 2020-06-08 DIAGNOSIS — I2510 Atherosclerotic heart disease of native coronary artery without angina pectoris: Secondary | ICD-10-CM

## 2020-06-08 NOTE — Progress Notes (Signed)
Virtual Visit via Telephone Note   This visit type was conducted due to national recommendations for restrictions regarding the COVID-19 Pandemic (e.g. social distancing) in an effort to limit this patient's exposure and mitigate transmission in our community.  Due to her co-morbid illnesses, this patient is at least at moderate risk for complications without adequate follow up.  This format is felt to be most appropriate for this patient at this time.  The patient did not have access to video technology/had technical difficulties with video requiring transitioning to audio format only (telephone).  All issues noted in this document were discussed and addressed.  No physical exam could be performed with this format.  Please refer to the patient's chart for her  consent to telehealth for [facility].   The patient was identified using 2 identifiers.  Patient Location: Home Provider Location: Home Office  Date:  06/08/2020   ID:  Jacqueline Palmer, DOB 17-Feb-1946, MRN [id number]  PCP:  Ladell Pier, MD  Cardiologist:  Buford Dresser, MD  Referring MD: Ladell Pier, MD   CC: follow up  History of Present Illness:    Jacqueline Palmer  [facility]) is a 75 y.o. female with a hx of hypertension, CAD s/p 4V CABG, MAZE, LAA ligation, bioprosthetic MVR (for rheumatic heart disease) 11/17/2015, permanent atrial fibrillation, chronic diastolic heart failure with recovered systolic dysfunction, hypothyroidism, CKD stage 4, metastatic renal cancer s/p radical nephrectomy and craniotomy for resection of brain tumor who is seen as a follow up today. I initially met her 02/19/20 new consult at the request of Ladell Pier, MD for the evaluation and management of multiple cardiovascular conditions.  CV history: -Followed by Dr. Westley Gambles at [facility] for BiV-ICD. She is 100% BiV paced. Had one episode of sustained VT terminated by ATP. Has permanent atrial fibrillation, on coumadin. Appears she had  AV node ablation 09/11/2016 at [facility] based on [place]. -Admitted for HF at [facility] 10/2019, 11/2019.  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 Follows with Dr. Alen Blew in medical oncology and Dr. Mickeal Skinner in neuro oncology. She has also established with Dr. Quentin Ore in EP and our coumadin clinic since our last visit.   Has pending CT and MRI to follow her tumor. Also has pending appt with [facility]. Completed physical therapy, doing occupational therapy three times/week for her right shoulder.  Doing well from a heart perspective. No recent hospitalizations. Coumadin regimen has worked well, no bruising/bleeding issues. Has not needed nitroglycerin in over a month. Has mild LE edema during the day, improves with elevation.   Denies chest pain, shortness of breath at rest or with normal exertion. No PND, orthopnea, or unexpected weight gain. No syncope or palpitations.  Staying active, walks hallways, uses nu-step.  Past Medical History:  Diagnosis Date  . Acute kidney injury ([place])   . Atrial fibrillation ([place])   . Cardiomyopathy ([place])   . CHF (congestive heart failure) ([place])   . Chronic a-fib ([place]) 08/14/2016  . Chronic anticoagulation 08/14/2016  . Coronary artery disease    4v CABG 6/17  . Hypertension   . 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 Thyroid disease     Past Surgical History:  Procedure Laterality Date  . ABDOMINAL HYSTERECTOMY    . CORONARY ARTERY BYPASS GRAFT    . CRANIOTOMY Right 07/30/2019   Right frontal  . NEPHRECTOMY Right 10/16/2019  Current Medications: Current Outpatient Medications on File Prior to Visit  Medication Sig  . acetaminophen (TYLENOL) 500 MG tablet Take 1,000 mg by mouth in the morning and at bedtime.  . colchicine 0.6 MG tablet Take 1 tablet (0.6 mg total) by mouth 2 (two) times daily. (Patient taking  differently: Take 0.3 mg by mouth every other day.)  . hydrALAZINE (APRESOLINE) 10 MG tablet Take 10 mg by mouth 3 (three) times daily.   Marland Kitchen levETIRAcetam (KEPPRA) 500 MG tablet Take 500 mg by mouth 2 (two) times daily.  Marland Kitchen levothyroxine (SYNTHROID, LEVOTHROID) 25 MCG tablet Take 25 mcg by mouth daily before breakfast.  . metoprolol succinate (TOPROL-XL) 25 MG 24 hr tablet Take 50 mg by mouth daily.  . nitroGLYCERIN (NITROSTAT) 0.4 MG SL tablet Place 0.4 mg under the tongue every 5 (five) minutes as needed for chest pain.   . polyethylene glycol (MIRALAX / GLYCOLAX) 17 g packet Take 17 g by mouth daily as needed for mild constipation.  . pravastatin (PRAVACHOL) 20 MG tablet Take 20 mg by mouth daily.  Marland Kitchen spironolactone (ALDACTONE) 25 MG tablet Take 25 mg by mouth daily.  Marland Kitchen torsemide (DEMADEX) 20 MG tablet Take 40 mg by mouth daily.  Marland Kitchen warfarin (COUMADIN) 5 MG tablet Take 1 tablet (5 mg total) by mouth See admin instructions. 2.73m Mon, Tues Thurs, Sat and 519mWed, Fri, Sunday (Patient taking differently: Take 5 mg by mouth every evening.)  . levETIRAcetam (KEPPRA) 500 MG tablet Take 1 tablet (500 mg total) by mouth 2 (two) times daily.   No current facility-administered medications on file prior to visit.     Allergies:   Avocado, Banana, Plantain, Ace inhibitors, Codeine, Prednisone, Tape, and Latex   Social History   Tobacco Use  . Smoking status: Former Smoker    Types: Cigarettes    Quit date: 07/30/2014    Years since quitting: 5.8  . Smokeless tobacco: Never Used  Vaping Use  . Vaping Use: Never used  Substance Use Topics  . Alcohol use: No  . Drug use: No    Family History: family history includes Heart attack in her father, mother, and sister. There is no history of Breast cancer.  ROS:   Please see the history of present illness.  Additional pertinent ROS otherwise unremarkable.     EKGs/Labs/Other Studies Reviewed:    The following studies were reviewed today: CIED  follow up 02/10/20 ([facility]) Stable BSC VVIR CRTD battery (5.5y58yrfunction and lead trends. BIVpacing=100% (CHB; PM DEPENDENT). Two NSVT episodes. One sustained VT espiode (avg=205bpm) terminated with one burst of ATP. Permanent AFib. OAC=warfarin  Echo 11/03/19 ([facility]) NORMAL LEFT VENTRICULAR FUNCTION WITH MODERATE LVH  MILD RV SYSTOLIC DYSFUNCTION (See above)  VALVULAR REGURGITATION: MILD AR, TRIVIAL MR, MILD PR  PROSTHETIC VALVE(S): BIOPROSTHETIC MV   Compared with prior Echo study on 06/27/2019: TR NOT AS WELL VISUALIZED ON  TODAY'S EXAM DUE TO PATIENTDISCOMFORT.  TR REVERSAL STILL NOTED IN THE HEPATIC VEINS, C/W SEVERE TR   TEE 09/01/2016 ([facility]) Limited study to assess for LA/LAA thrombus pre-ablation/DCCV   1. Small amount of laminar thrombus in tip of LAA, smaller than prior TEE  (02/04/16)  2. Definity used demonstrates flowcommunication into LAA despite previous  ligation attempt and confirms thrombus in tip of LAA  3. No RA/RAA thrombus  4. Severe TR    EKG:  EKG is personally reviewed.  The ekg ordered 03/04/20 demonstrates atypical flutter with biventricular pacing  Recent Labs: 04/08/2020: ALT 15;  BUN 27; Creatinine 2.09; Hemoglobin 10.9; Platelet Count 268; Potassium 4.0; Sodium 141 05/03/2020: TSH 2.940  Recent Lipid Panel No results found for: CHOL, TRIG, HDL, CHOLHDL, VLDL, LDLCALC, LDLDIRECT  Physical Exam:    VS:  BP 121/80   Ht '5\' 8"'  (1.727 m)   Wt 194 lb (88 kg)   BMI 29.50 kg/m     Wt Readings from Last 3 Encounters:  06/08/20 194 lb (88 kg)  05/03/20 194 lb 9.6 oz (88.3 kg)  04/08/20 195 lb 4.8 oz (88.6 kg)    Speaking comfortably on the phone, no audible wheezing In no acute distress Alert and oriented Normal affect Normal speech  ASSESSMENT:    1. Chronic diastolic heart failure ([place])   2. Chronic a-fib ([place])   3. Chronic anticoagulation   4. Biventricular automatic implantable cardioverter defibrillator in  situ   5. S/P MVR (mitral valve replacement)   6. Hx of CABG   7. Coronary artery disease involving native coronary artery of native heart without angina pectoris    PLAN:    Chronic diastolic heart failure, with prior hospitalizations at [facility] -reports no worsening clinical symptoms today -tolerating torsemide and spironolactone -per [facility] discharge summary, was tried on dapagliflozin but was cost prohibitive. Given renal disease, will not use at this time (last GFR I have is 24) -has not been on valsartan-HCTZ since June. With single kidney, normal EF, renal disease, will not add back at this time  Permanent atrial fib/flutter, s/p AV node ablation Sustained VT requiring ATP -CHA2DS2/VAS Stroke Risk Points=5  -had LAA ligation but TEE at [facility] noted prior thrombus and residual flow -on coumadin, established with coumadin clinic here -has BiV-ICD in place, has established with Dr. Quentin Ore  CAD s/p 4V CABG Rheumatic MV disease s/p MVR Hypercholesterolemia, goal LDL <70 -rare angina, has not required NG in >1 month -no aspirin as she is on coumadin -on pravastatin, tolerating -last lipids 04/11/19 at [facility], Thcol 128, LDL 64, HDL 49, TG 77 -on metoprolol succinate as antianginal  CKD stage 3-4, with right radical nephrectomy for renal cell carcinoma Hypertension -last GFR 24, consistent with stage 4 CKD -on spironolactone, torsemide for volume management -on hydralazine for BP control -if needed, could add imdur or change to carvedilol  -no ACEi/ARB given single kidney and chronic kidney disease   Cardiac risk counseling and prevention recommendations: -recommend heart healthy/Mediterranean diet, with whole grains, fruits, vegetable, fish, lean meats, nuts, and olive oil. Limit salt. -recommend moderate walking, 3-5 times/week for 30-50 minutes each session. Aim for at least 150 minutes.week. Goal should be pace of 3 miles/hours, or walking 1.5 miles in 30 minutes -recommend  avoidance of tobacco products. Avoid excess alcohol.  Plan for follow up: 4 mos or sooner PRN  Today, I have spent 14 minutes with the patient with telehealth technology discussing the above problems.  Additional time spent in chart review, documentation, and communication.  Buford Dresser, MD, PhD, [facility]   Medication Adjustments/Labs and Tests Ordered: Current medicines are reviewed at length with the patient today.  Concerns regarding medicines are outlined above.  No orders of the defined types were placed in this encounter.  No orders of the defined types were placed in this encounter.   Patient Instructions  Medication Instructions:  Your Physician recommend you continue on your current medication as directed.    *If you need a refill on your cardiac medications before your next appointment, please call your pharmacy*  Lab Work: None   Testing/Procedures: None   Follow-Up: At [facility], you and your health needs are our priority.  As part of our continuing mission to provide you with exceptional heart care, we have created designated Provider Care Teams.  These Care Teams include your primary Cardiologist (physician) and Advanced Practice Providers (APPs -  Physician Assistants and Nurse Practitioners) who all work together to provide you with the care you need, when you need it.  We recommend signing up for the patient portal called "MyChart".  Sign up information is provided on this After Visit Summary.  MyChart is used to connect with patients for Virtual Visits (Telemedicine).  Patients are able to view lab/test results, encounter notes, upcoming appointments, etc.  Non-urgent messages can be sent to your provider as well.   To learn more about what you can do with MyChart, go to [email or web address].    Your next appointment:   4 months  The format for your next appointment:   In Person  Provider:   Buford Dresser, MD       Signed, Buford Dresser, MD PhD 06/08/2020 3:33 PM    [place]

## 2020-06-08 NOTE — Patient Instructions (Signed)
Medication Instructions:  Your Physician recommend you continue on your current medication as directed.    *If you need a refill on your cardiac medications before your next appointment, please call your pharmacy*   Lab Work: None   Testing/Procedures: None   Follow-Up: At [facility], you and your health needs are our priority.  As part of our continuing mission to provide you with exceptional heart care, we have created designated Provider Care Teams.  These Care Teams include your primary Cardiologist (physician) and Advanced Practice Providers (APPs -  Physician Assistants and Nurse Practitioners) who all work together to provide you with the care you need, when you need it.  We recommend signing up for the patient portal called "MyChart".  Sign up information is provided on this After Visit Summary.  MyChart is used to connect with patients for Virtual Visits (Telemedicine).  Patients are able to view lab/test results, encounter notes, upcoming appointments, etc.  Non-urgent messages can be sent to your provider as well.   To learn more about what you can do with MyChart, go to [email or web address].    Your next appointment:   4 month(s)  The format for your next appointment:   In Person  Provider:   Bridgette Christopher, MD    

## 2020-06-09 ENCOUNTER — Ambulatory Visit: Payer: Medicare Other | Admitting: Neurology

## 2020-06-14 ENCOUNTER — Other Ambulatory Visit: Payer: Self-pay

## 2020-06-14 ENCOUNTER — Ambulatory Visit (INDEPENDENT_AMBULATORY_CARE_PROVIDER_SITE_OTHER): Payer: Medicare Other

## 2020-06-14 DIAGNOSIS — Z5181 Encounter for therapeutic drug level monitoring: Secondary | ICD-10-CM

## 2020-06-14 DIAGNOSIS — Z7901 Long term (current) use of anticoagulants: Secondary | ICD-10-CM | POA: Diagnosis not present

## 2020-06-14 DIAGNOSIS — I482 Chronic atrial fibrillation, unspecified: Secondary | ICD-10-CM | POA: Diagnosis not present

## 2020-06-14 LAB — POCT INR: INR: 2.6 (ref 2.0–3.0)

## 2020-06-14 NOTE — Patient Instructions (Signed)
Continue with 1.5 tablets daily except 1 tablet each Tuesday, Thursday and Saturday. Repeat INR in 4 weeks (only has transportation on Tues/Thursday)

## 2020-06-15 ENCOUNTER — Ambulatory Visit (HOSPITAL_COMMUNITY)
Admission: RE | Admit: 2020-06-15 | Discharge: 2020-06-15 | Disposition: A | Discharge status: Home / Self Care | Payer: Medicare Other | Source: Ambulatory Visit | Attending: Internal Medicine | Admitting: Internal Medicine

## 2020-06-15 DIAGNOSIS — C7931 Secondary malignant neoplasm of brain: Secondary | ICD-10-CM | POA: Insufficient documentation

## 2020-06-15 IMAGING — MR MR HEAD WO/W CM
9 of 11 series · 26 of 48 positions shown · IV contrast (gadavist)
Comparison: MRI head [DATE]

CLINICAL DATA: Metastatic renal cell carcinoma. Follow-up response
to treatment

EXAM:
MRI HEAD WITHOUT AND WITH CONTRAST
TECHNIQUE: Multiplanar, multiecho pulse sequences of the brain and surrounding
structures were obtained without and with intravenous contrast.
CONTRAST:  8mL GADAVIST GADOBUTROL 1 MMOL/ML IV SOLN

[Series 4: T1 · sagittal · 3.0mm · 0.47mm/px · 2 of 35 slices shown]
[im 1/35]
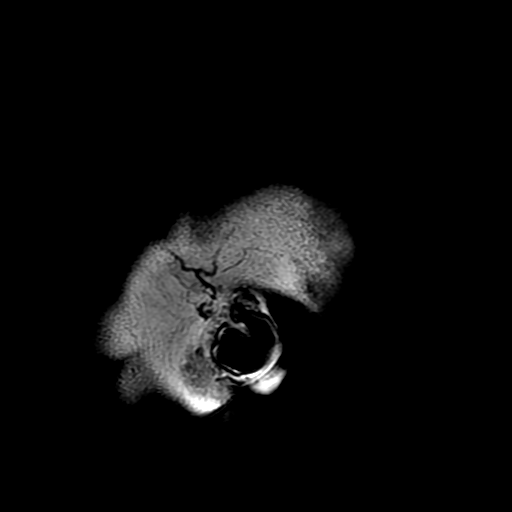
[im 35/35]
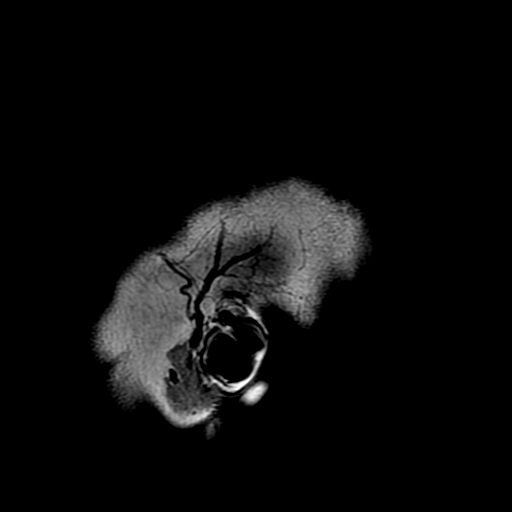

[Series 5: DWI · axial · 3.0mm · 1.09mm/px · z∈[-18,+111]mm · 6 of 90 slices shown (1 of 2)]
[im 1/90]
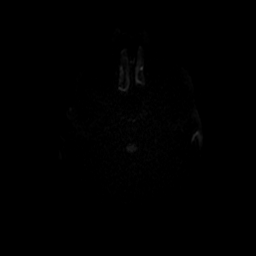
[im 18/90]
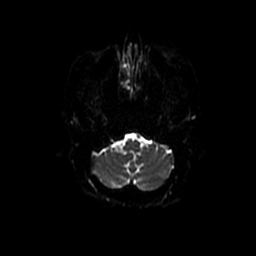
[im 36/90]
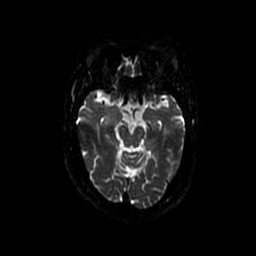
[im 54/90]
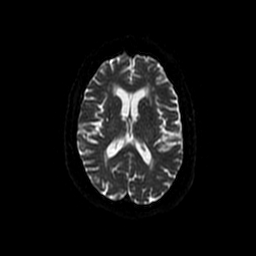
[im 72/90]
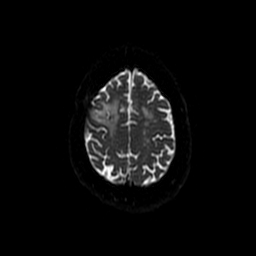
[im 90/90]
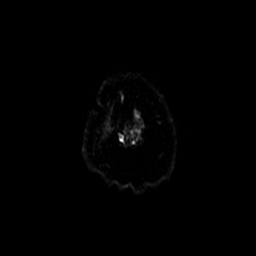

[Series 6: T2 · axial · 5.0mm · 0.43mm/px · z∈[-37,+99]mm · 2 of 24 slices shown (1 of 2)]
[im 1/24]
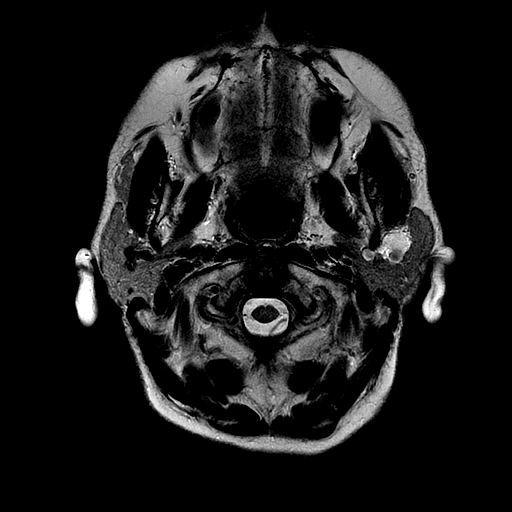
[im 24/24]
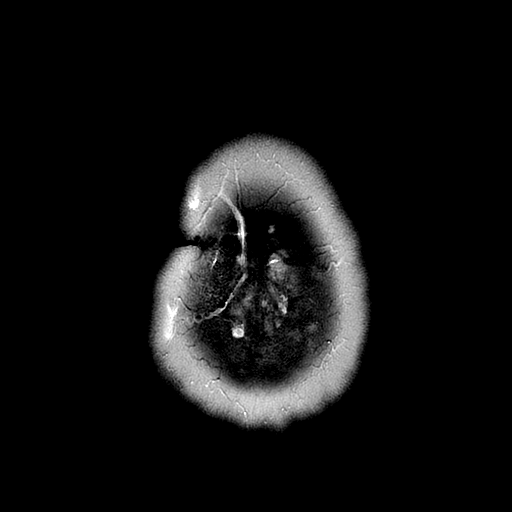

[Series 8: ax mpgr · axial · 5.0mm · 0.43mm/px · z∈[-44,+94]mm · 2 of 24 slices shown]
[im 1/24]
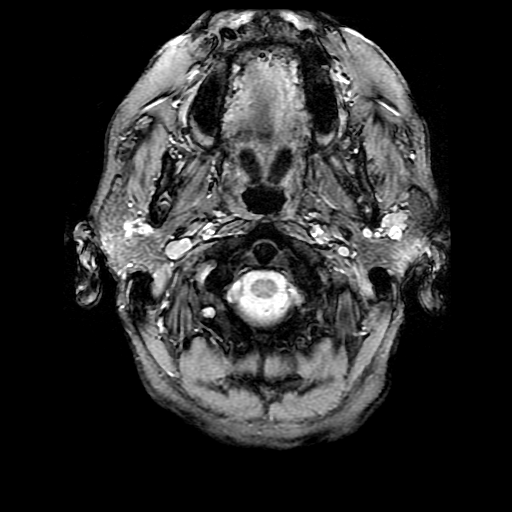
[im 24/24]
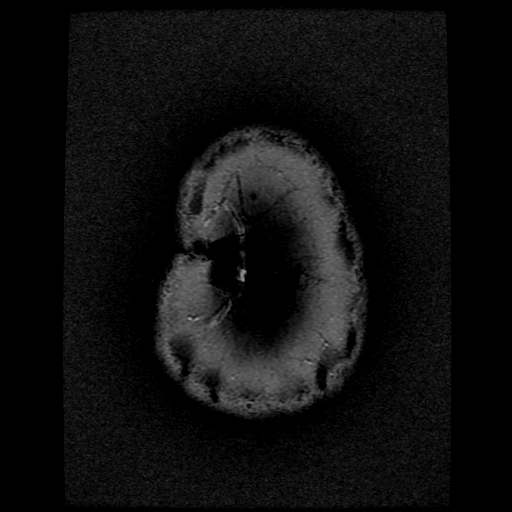

[Series 10: FLAIR · axial · 3.0mm · 1.02mm/px · z∈[-55,+95]mm · 3 of 51 slices shown]
[im 1/51]
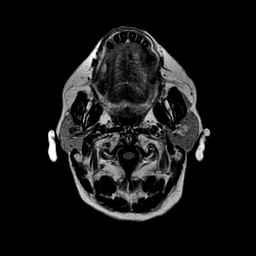
[im 26/51]
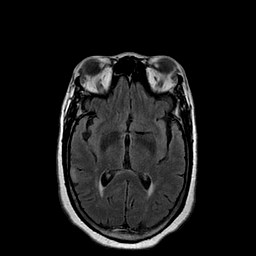
[im 51/51]
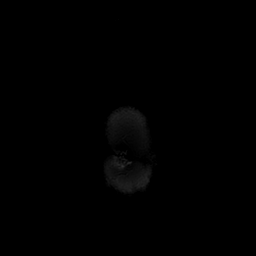

[Series 11: T2 · coronal · 3.0mm · 0.39mm/px · 3 of 44 slices shown (2 of 2)]
[im 1/44]
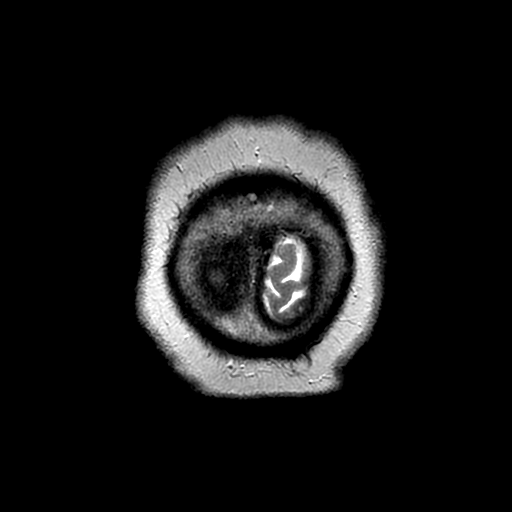
[im 22/44]
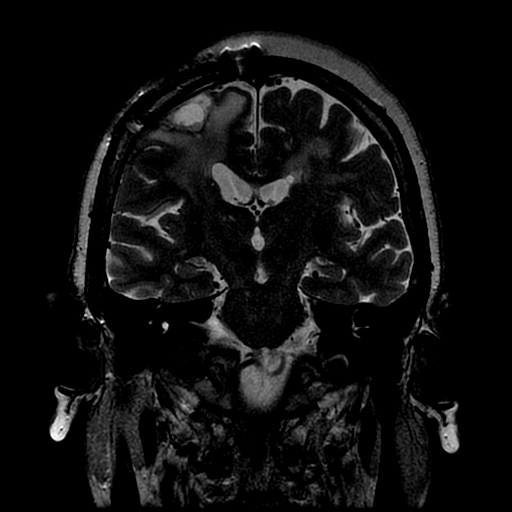
[im 44/44]
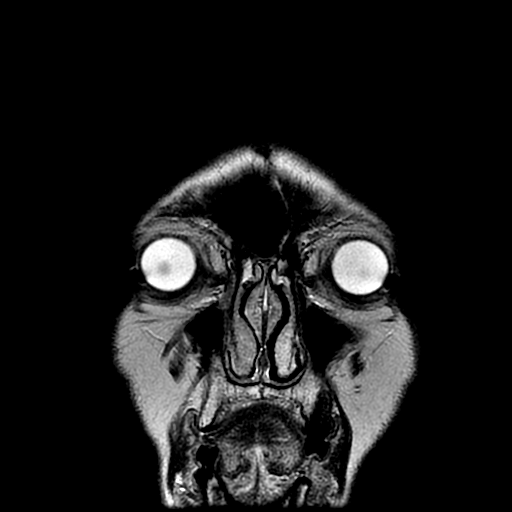

[Series 13: T1 post-contrast · coronal · 3.0mm · 0.45mm/px · 3 of 46 slices shown (1 of 2)]
[im 1/46]
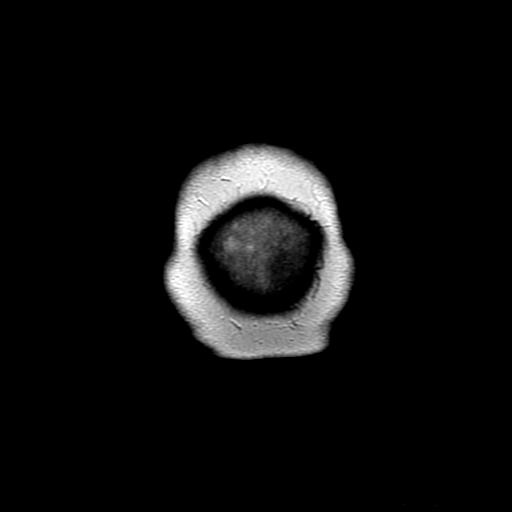
[im 23/46]
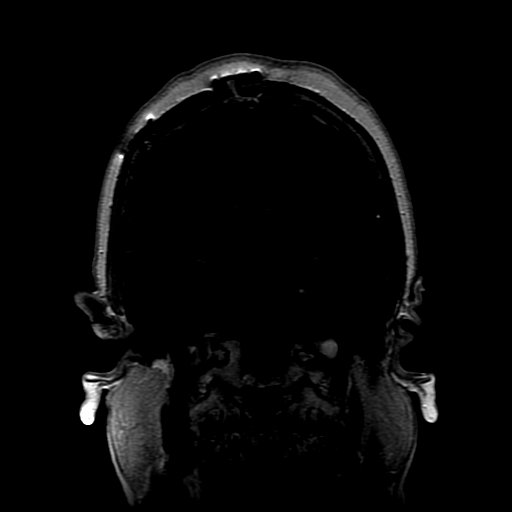
[im 46/46]
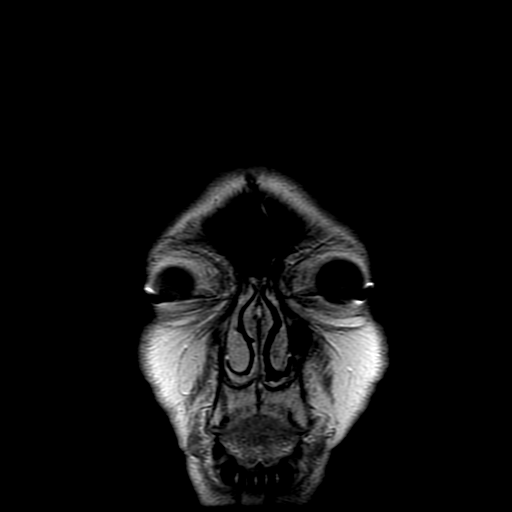

[Series 14: T1 post-contrast · sagittal · 3.0mm · 0.47mm/px · 2 of 34 slices shown (2 of 2)]
[im 1/34]
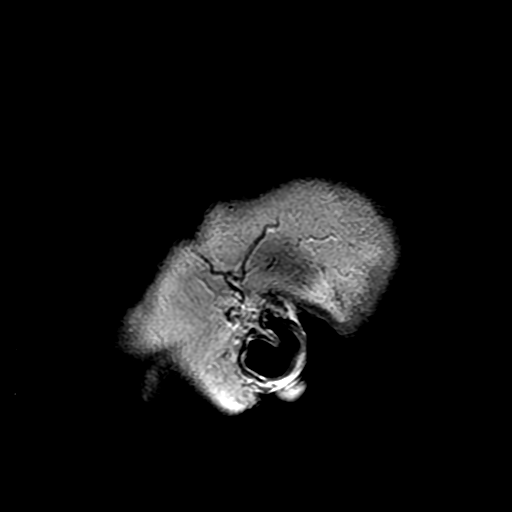
[im 34/34]
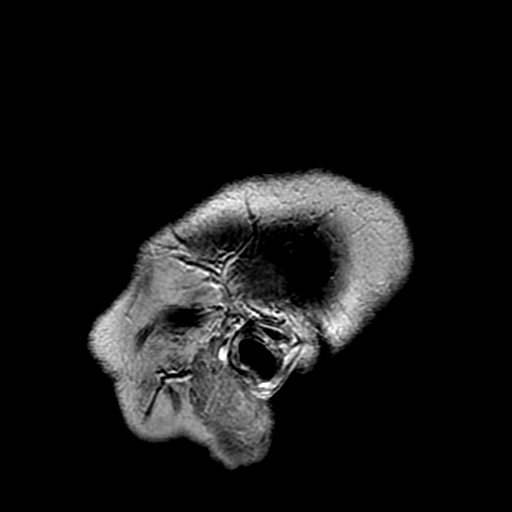

[Series 500: DWI · axial · 3.0mm · 1.09mm/px · z∈[-18,+111]mm · 3 of 45 slices shown (2 of 2)]
[im 1/45]
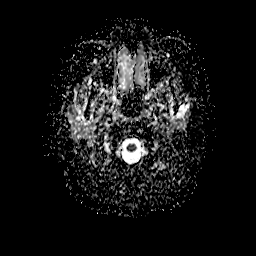
[im 23/45]
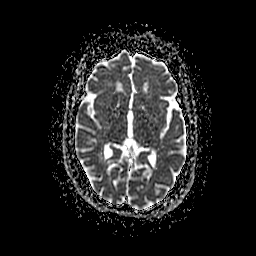
[im 45/45]
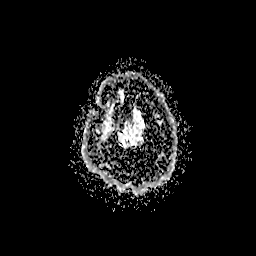

[26 of 48 positions shown; findings below may reference images not displayed]

FINDINGS: Brain: Right convexity craniotomy. Surgical resection site right
frontal convexity appears stable with mild peripheral enhancement
which is unchanged. Previously noted dural based mass caudal to the
resection site has resolved. There is mild dural enhancement on the
right due to postoperative change without nodularity.

No new areas of metastatic disease. Ventricle size normal. Chronic
white matter changes. No acute infarct.

Vascular: Normal arterial flow voids.

Skull and upper cervical spine: Right-sided craniotomy. No evidence
of metastatic disease to the calvarium

Sinuses/Orbits: Paranasal sinuses clear.  Negative orbit

Other: T2 hyperintensity in the left anterior parotid lobe shows
enhancement with branching most consistent with prominent veins.
Similar but smaller enhancing vessels right parotid.
IMPRESSION: Stable resection site right frontal convexity. Previously noted
dural based enhancing mass on the right has resolved. No new area of
metastatic disease.

## 2020-06-15 MED ORDER — GADOBUTROL 1 MMOL/ML IV SOLN
8.0000 mL | Freq: Once | INTRAVENOUS | Status: AC | PRN
  Administered 2020-06-15: 8 mL via INTRAVENOUS

## 2020-06-15 NOTE — Progress Notes (Signed)
Patient here today for MRI  Brain W WO. Performed interrogation via facetime with [facility]. Orders received for VOO 80. Will re-program once scan is completed.

## 2020-06-15 NOTE — Progress Notes (Signed)
Informed of MRI for today.   Device system confirmed to be MRI conditional, with implant date > 6 weeks ago and no evidence of abandoned or epicardial leads in review of most recent CXR Interrogation from today reviewed personally by device rep.   Change device settings for MRI to VOO at 80 bpm  Tachy-therapies to off if applicable.  Program device back to pre-MRI settings after completion of exam.  Annamaria Helling  06/15/2020 1:36 PM

## 2020-06-16 ENCOUNTER — Encounter: Payer: Self-pay | Admitting: Neurology

## 2020-06-16 ENCOUNTER — Other Ambulatory Visit: Payer: Self-pay

## 2020-06-16 ENCOUNTER — Ambulatory Visit (INDEPENDENT_AMBULATORY_CARE_PROVIDER_SITE_OTHER): Payer: Medicare Other | Admitting: Neurology

## 2020-06-16 VITALS — BP 141/80 | HR 71 | Ht 68.0 in | Wt 196.5 lb

## 2020-06-16 DIAGNOSIS — I2510 Atherosclerotic heart disease of native coronary artery without angina pectoris: Secondary | ICD-10-CM

## 2020-06-16 DIAGNOSIS — C7931 Secondary malignant neoplasm of brain: Secondary | ICD-10-CM | POA: Diagnosis not present

## 2020-06-16 DIAGNOSIS — R569 Unspecified convulsions: Secondary | ICD-10-CM | POA: Insufficient documentation

## 2020-06-16 DIAGNOSIS — C641 Malignant neoplasm of right kidney, except renal pelvis: Secondary | ICD-10-CM

## 2020-06-16 DIAGNOSIS — I482 Chronic atrial fibrillation, unspecified: Secondary | ICD-10-CM

## 2020-06-16 MED ORDER — LEVETIRACETAM 500 MG PO TABS
ORAL_TABLET | ORAL | 4 refills | Status: DC
Start: 2020-06-16 — End: 2021-01-18

## 2020-06-16 NOTE — Progress Notes (Signed)
[facility]  PATIENT: Jacqueline Palmer DOB: 09-16-1945  REFERRING DOCTOR OR PCP: Dr. Mickeal Skinner (neuro-oncology); Karle Plumber, MD (PCP) SOURCE: Patient, notes from Dr. Mickeal Skinner,  _________________________________   HISTORICAL  CHIEF COMPLAINT:  Chief Complaint  Patient presents with  . Follow-up  . Neck Pain  . New Patient (Initial Visit)    RM 13. Internal referral for seizures. Ambulates with cane. No falls in last year. Had first seizure in March 2021. Due to a tumor on brain. Had craniotomy. Taken off seizure medication at [facility]. Had another seizure 12/2019. Went back on seizure med and has been seizure free since.     HISTORY OF PRESENT ILLNESS:  I had the pleasure seeing your patient, Jacqueline Palmer, at [facility] for neurologic consultation regarding her history of brain tumor and seizures.  She is a 75 year old woman who had a seizure 07/25/2019 right after her second Covid-19 shot (during 15 minute observation). The seizure began with not being able to speak or move (she recalls this) but not being unresponsive.  Then she lost consciousness and regained it while in the hospital (in ED) but she does not remember the ambulance ride or earlier in the ED.   She was seen at [facility] in [place].  Though we do not have reports or images, she was found to have a tumor.   She had a craniotomy to remove the tumor.  It was found to be a metastasis from a renal cell carcinoma.   She had a nephrectomy.   Follow-up MRI showed leptomeningeal thickening with enhancement on the right as well as a meningeal based mass and she then had radiation to the brain.    She is seeing Dr. Mickeal Skinner and Dr. Lisbeth Renshaw of neuro-oncology and radiation oncology.  After the first seizure, she was placed on Keppra 500 mg bid did well.  However, after about 4 months she discontinued the medication and had another seizure August 2021.  She went back on the Keppra 500 mg and has been  seizure-free since.  She is back to driving now.  Images reviewed: 02/20/2020 MRI of the brain shows encephalomalacia in the right frontal lobe associated with craniotomy consistent with her prior tumor removal.  There is a dural based extra-axial mass measuring 24 mm in longest diameter and leptomeningeal spread.  06/15/2020 MRI of the brain shows right frontal lobe craniotomy and encephalomalacia.  The dural based mass and dural enhancement noted on the 02/20/2020 MRI has resolved.  REVIEW OF SYSTEMS: Constitutional: No fevers, chills, sweats, or change in appetite Eyes: No visual changes, double vision, eye pain Ear, nose and throat: No hearing loss, ear pain, nasal congestion, sore throat Cardiovascular: History of CABG and mitral valve replacement and A. fib Respiratory: No shortness of breath at rest or with exertion.   No wheezes GastrointestinaI: No nausea, vomiting, diarrhea, abdominal pain, fecal incontinence Genitourinary: No dysuria, urinary retention or frequency.  She has renal failure and history of right renal cell carcinoma and nephrectomy Musculoskeletal: No neck pain, back pain.  She has gout. Integumentary: No rash, pruritus, skin lesions Neurological: as above Psychiatric: No depression at this time.  No anxiety Endocrine: No palpitations, diaphoresis, change in appetite, change in weigh or increased thirst Hematologic/Lymphatic: No anemia, purpura, petechiae. Allergic/Immunologic: No itchy/runny eyes, nasal congestion, recent allergic reactions, rashes  ALLERGIES: Allergies  Allergen Reactions  . Avocado Shortness Of Breath    Before 2007  . Banana Anaphylaxis and Swelling  . Plantain  Anaphylaxis and Swelling  . Ace Inhibitors Cough  . Codeine Other (See Comments)    Nausea and Feels Jittery  . Prednisone Swelling    Of face and lower extremities  . Tape Hives    Tape  Does better with paper tape  Tape  Does better with paper tape    . Latex Rash     HOME MEDICATIONS:  Current Outpatient Medications:  .  acetaminophen (TYLENOL) 500 MG tablet, Take 1,000 mg by mouth in the morning and at bedtime., Disp: , Rfl:  .  colchicine 0.6 MG tablet, Take 1 tablet (0.6 mg total) by mouth 2 (two) times daily. (Patient taking differently: Take 0.3 mg by mouth every other day.), Disp: 14 tablet, Rfl: 0 .  hydrALAZINE (APRESOLINE) 10 MG tablet, Take 10 mg by mouth 3 (three) times daily. , Disp: , Rfl:  .  levothyroxine (SYNTHROID, LEVOTHROID) 25 MCG tablet, Take 25 mcg by mouth daily before breakfast., Disp: , Rfl:  .  metoprolol succinate (TOPROL-XL) 25 MG 24 hr tablet, Take 50 mg by mouth daily., Disp: , Rfl:  .  nitroGLYCERIN (NITROSTAT) 0.4 MG SL tablet, Place 0.4 mg under the tongue every 5 (five) minutes as needed for chest pain. , Disp: , Rfl:  .  polyethylene glycol (MIRALAX / GLYCOLAX) 17 g packet, Take 17 g by mouth daily as needed for mild constipation., Disp: , Rfl:  .  pravastatin (PRAVACHOL) 20 MG tablet, Take 20 mg by mouth daily., Disp: , Rfl:  .  spironolactone (ALDACTONE) 25 MG tablet, Take 25 mg by mouth daily., Disp: , Rfl:  .  torsemide (DEMADEX) 20 MG tablet, Take 40 mg by mouth daily., Disp: , Rfl:  .  warfarin (COUMADIN) 5 MG tablet, Take 1 tablet (5 mg total) by mouth See admin instructions. 2.42m Mon, Tues Thurs, Sat and 553mWed, Fri, Sunday (Patient taking differently: Take 5 mg by mouth every evening.), Disp: 30 tablet, Rfl: 1 .  levETIRAcetam (KEPPRA) 500 MG tablet, Take 1 tablet (500 mg total) by mouth 2 (two) times daily., Disp: 60 tablet, Rfl: 0 .  levETIRAcetam (KEPPRA) 500 MG tablet, One po bid, Disp: 180 tablet, Rfl: 4  PAST MEDICAL HISTORY: Past Medical History:  Diagnosis Date  . Acute kidney injury (HC[place]  . Atrial fibrillation (HCE[place]  . Cardiomyopathy (HCL[place]  . CHF (congestive heart failure) (HCAman[place]  . Chronic a-fib (HCSt[place] On[place]3/26/2018  . Chronic anticoagulation 08/14/2016  . Coronary artery disease    4v CABG  6/17  . Hypertension   . 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 Thyroid disease     PAST SURGICAL HISTORY: Past Surgical History:  Procedure Laterality Date  . ABDOMINAL HYSTERECTOMY    . CORONARY ARTERY BYPASS GRAFT    . CRANIOTOMY Right 07/30/2019   Right frontal  . NEPHRECTOMY Right 10/16/2019    FAMILY HISTORY: Family History  Problem Relation Age of Onset  . Heart attack Mother   . Heart attack Father   . Heart attack Sister   . Breast cancer Neg Hx     SOCIAL HISTORY:  Social History   Socioeconomic History  . Marital status: Widowed    Spouse name: Not on file  . Number of children: 0  . Years of education: Doctorate  . Highest education level: Master's degree (e.g., MA, MS, MEng, MEd, MSW, MBA)  Occupational History  . Occupation: Retired ChClinical biochemistTobacco Use  . Smoking status:  Former Smoker    Types: Cigarettes    Quit date: 07/30/2014    Years since quitting: 5.8  . Smokeless tobacco: Never Used  Vaping Use  . Vaping Use: Never used  Substance and Sexual Activity  . Alcohol use: Yes    Comment: 1 glass wine twice weekly  . Drug use: No  . Sexual activity: Not on file  Other Topics Concern  . Not on file  Social History Narrative   Right handed   Coffee daily/tea qod   Social Determinants of Health   Financial Resource Strain: Not on file  Food Insecurity: Not on file  Transportation Needs: Not on file  Physical Activity: Not on file  Stress: Not on file  Social Connections: Not on file  Intimate Partner Violence: Not on file     PHYSICAL EXAM  Vitals:   06/16/20 1331  BP: (!) 141/80  Pulse: 71  SpO2: 97%  Weight: 196 lb 8 oz (89.1 kg)  Height: '5\' 8"'  (1.727 m)    Body mass index is 29.88 kg/m.   General: The patient is well-developed and well-nourished and in no acute distress  HEENT:  Head is [place]/AT.  Sclera are anicteric.  Normal funduscopic examination  Neck: No carotid bruits are  noted.  The neck is nontender.  Cardiovascular: The heart has an irregular rate and rhythm with a normal S1 and S2.   Skin: Extremities are without rash or  edema.  Musculoskeletal:  Back is nontender  Neurologic Exam  Mental status: The patient is alert and oriented x 3 at the time of the examination. The patient has apparent normal recent and remote memory, with an apparently normal attention span and concentration ability.   Speech is normal.  Cranial nerves: Extraocular movements are full.  Pupils react to light and accommodation.  Facial symmetry is present. There is good facial sensation to soft touch bilaterally.Facial strength is normal.  Trapezius and sternocleidomastoid strength is normal. No dysarthria is noted.  The tongue is midline, and the patient has symmetric elevation of the soft palate. No obvious hearing deficits are noted.  Motor:  Muscle bulk is normal.   Tone is normal. Strength is  5 / 5 in the right arm and both legs.  Strength was 4+/5 in the left arm.  She had reduced rapid altering movements on the left..   Sensory: Sensory testing is intact to pinprick, soft touch and vibration sensation in all 4 extremities.  Coordination: Cerebellar testing reveals reduced left finger-nose-finger and heel-to-shin bilaterally.  Gait and station: Station is normal.   Gait is normal. Tandem gait is mildly wide. Romberg is negative.   Reflexes: Deep tendon reflexes are symmetric and normal bilaterally.   Plantar responses are flexor.    DIAGNOSTIC DATA (LABS, IMAGING, TESTING) - I reviewed patient records, labs, notes, testing and imaging myself where available.  Lab Results  Component Value Date   WBC 5.8 04/08/2020   HGB 10.9 (L) 04/08/2020   HCT 33.4 (L) 04/08/2020   MCV 73.7 (L) 04/08/2020   PLT 268 04/08/2020      Component Value Date/Time   NA 141 04/08/2020 0938   NA 140 08/24/2016 0930   K 4.0 04/08/2020 0938   CL 106 04/08/2020 0938   CO2 28 04/08/2020  0938   GLUCOSE 74 04/08/2020 0938   BUN 27 (H) 04/08/2020 0938   BUN 25 08/24/2016 0930   CREATININE 2.09 (H) 04/08/2020 0938   CALCIUM 9.3 04/08/2020 5248  PROT 7.1 04/08/2020 0938   ALBUMIN 3.9 04/08/2020 0938   AST 21 04/08/2020 0938   ALT 15 04/08/2020 0938   ALKPHOS 88 04/08/2020 0938   BILITOT 0.9 04/08/2020 0938   GFRNONAA 24 (L) 04/08/2020 0938   GFRAA 30 (L) 01/20/2020 1433    Lab Results  Component Value Date   TSH 2.940 05/03/2020       ASSESSMENT AND PLAN  Brain metastasis (HCC)  Renal cell carcinoma of right kidney (HCC)  Chronic a-fib (HCC)  Seizures ([place])   In summary, Jacqueline Palmer is a 75 year old woman with a 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  She had craniotomy with removal of the tumor but it recurred and she has had radiation.  With the exception of mild left arm/hand weakness she notes no other symptoms.  She also has had recent nephrectomy due to the renal cell carcinoma that was the source of the brain met.  We had a discussion about treatments.  I recommend that she continue on the Keppra 500 mg p.o. twice daily.  Due to the significant scar and the fact she had a recurrent seizure when she went off of Keppra last year, I feel it is unlikely that she will be able to go off of the medication.  If she has breakthrough seizure consider increasing the dose or adding a second medication.  She will return to see [name] in 1 year but sooner if there are new or worsening neurologic symptoms.  Thank you for asking me to see Jacqueline Palmer.  Please let me know can be of further assistance with her or other patients in the future.   Azul Coffie A. Felecia Shelling, MD, [facility] 10/26/3014, 0:10 PM Certified in Neurology, Clinical Neurophysiology, Sleep Medicine and Neuroimaging  [facility] [address] [place],  [address] [phone]

## 2020-06-17 ENCOUNTER — Inpatient Hospital Stay: Payer: Medicare Other | Attending: Oncology | Admitting: Internal Medicine

## 2020-06-17 ENCOUNTER — Other Ambulatory Visit: Payer: Self-pay

## 2020-06-17 VITALS — BP 127/88 | HR 69 | Temp 97.0°F | Resp 17 | Ht 68.0 in | Wt 196.8 lb

## 2020-06-17 DIAGNOSIS — I2510 Atherosclerotic heart disease of native coronary artery without angina pectoris: Secondary | ICD-10-CM | POA: Diagnosis not present

## 2020-06-17 DIAGNOSIS — C641 Malignant neoplasm of right kidney, except renal pelvis: Secondary | ICD-10-CM | POA: Insufficient documentation

## 2020-06-17 DIAGNOSIS — Q043 Other reduction deformities of brain: Secondary | ICD-10-CM | POA: Insufficient documentation

## 2020-06-17 DIAGNOSIS — Z7901 Long term (current) use of anticoagulants: Secondary | ICD-10-CM | POA: Insufficient documentation

## 2020-06-17 DIAGNOSIS — I110 Hypertensive heart disease with heart failure: Secondary | ICD-10-CM | POA: Insufficient documentation

## 2020-06-17 DIAGNOSIS — Z951 Presence of aortocoronary bypass graft: Secondary | ICD-10-CM | POA: Insufficient documentation

## 2020-06-17 DIAGNOSIS — C7931 Secondary malignant neoplasm of brain: Secondary | ICD-10-CM | POA: Diagnosis not present

## 2020-06-17 DIAGNOSIS — Z79899 Other long term (current) drug therapy: Secondary | ICD-10-CM | POA: Diagnosis not present

## 2020-06-17 DIAGNOSIS — I509 Heart failure, unspecified: Secondary | ICD-10-CM | POA: Insufficient documentation

## 2020-06-17 DIAGNOSIS — I4891 Unspecified atrial fibrillation: Secondary | ICD-10-CM | POA: Diagnosis not present

## 2020-06-17 DIAGNOSIS — Z905 Acquired absence of kidney: Secondary | ICD-10-CM | POA: Diagnosis not present

## 2020-06-17 DIAGNOSIS — Z87891 Personal history of nicotine dependence: Secondary | ICD-10-CM | POA: Insufficient documentation

## 2020-06-17 DIAGNOSIS — R42 Dizziness and giddiness: Secondary | ICD-10-CM | POA: Insufficient documentation

## 2020-06-17 NOTE — Progress Notes (Signed)
Remote ICD transmission.   

## 2020-06-17 NOTE — Progress Notes (Signed)
Jacqueline Palmer, Jacqueline Palmer [phone]   Interval Evaluation  Date of Service: 06/17/20 Patient Name: Jacqueline Palmer Patient MRN: [id number] Patient DOB: [birth year] Provider: Ventura Sellers, MD  Identifying Statement:  Jacqueline Palmer is a 75 y.o. female with Brain metastasis ([place]) [C79.31]   Primary Cancer: Renal Cell Carcinoma, Stage IV  Oncologic History: 07/30/19: Right frontal craniotomy, resection of solitary brain metastasis at [facility] 10/06/19: Post-operative SRS at [place] (Dr. Tyrone Nine) 03/09/20: Salvage SRS to R frontal+LMD 27/3x  Interval History:  Jacqueline Palmer presents today for follow up after recent SRS, 3 month follow up MRI can.  No recent seizures, continues to take Keppra 500mg  twice per day.  She continues to have occassional dizziness, self limited.  No new or progressive neurologic deficits.  Continues to be active mentally and engaging socially; also working with PT and OT at her residential facility.  H+P (02/24/20) Patient presents to review recent changes on brain MRI.  She initially presented in March 2021 with a seizure, described as "left side clenching up, lost awareness for a few minutes".  CNS imaging demonstrated an enhancing right frontal mass, c/w likely metastasis from renal cell carcinoma.  Craniotomy was performed at [facility] and followed with surgical bed radiosurgery at [facility].  She did have one recurrence of seizure activity in late August, at which time she was restarted on Keppra.  Unclear on why this was discontinued.  Recent MRI demonstrated some change and she presents today for treatment recommendations.  No neurologic complaints aside from seizures.  Medications: Current Outpatient Medications on File Prior to Visit  Medication Sig Dispense Refill  . acetaminophen (TYLENOL) 500 MG tablet Take 1,000 mg by mouth in the morning and at bedtime.    . colchicine 0.6 MG tablet Take 1 tablet  (0.6 mg total) by mouth 2 (two) times daily. (Patient taking differently: Take 0.3 mg by mouth every other day.) 14 tablet 0  . hydrALAZINE (APRESOLINE) 10 MG tablet Take 10 mg by mouth 3 (three) times daily.     Marland Kitchen levETIRAcetam (KEPPRA) 500 MG tablet Take 1 tablet (500 mg total) by mouth 2 (two) times daily. 60 tablet 0  . levETIRAcetam (KEPPRA) 500 MG tablet One po bid 180 tablet 4  . levothyroxine (SYNTHROID, LEVOTHROID) 25 MCG tablet Take 25 mcg by mouth daily before breakfast.    . metoprolol succinate (TOPROL-XL) 25 MG 24 hr tablet Take 50 mg by mouth daily.    . nitroGLYCERIN (NITROSTAT) 0.4 MG SL tablet Place 0.4 mg under the tongue every 5 (five) minutes as needed for chest pain.     . polyethylene glycol (MIRALAX / GLYCOLAX) 17 g packet Take 17 g by mouth daily as needed for mild constipation.    . pravastatin (PRAVACHOL) 20 MG tablet Take 20 mg by mouth daily.    Marland Kitchen spironolactone (ALDACTONE) 25 MG tablet Take 25 mg by mouth daily.    Marland Kitchen torsemide (DEMADEX) 20 MG tablet Take 40 mg by mouth daily.    Marland Kitchen warfarin (COUMADIN) 5 MG tablet Take 1 tablet (5 mg total) by mouth See admin instructions. 2.5mg  Mon, Tues Thurs, Sat and 5mg  Wed, Fri, Sunday (Patient taking differently: Take 5 mg by mouth every evening.) 30 tablet 1   No current facility-administered medications on file prior to visit.    Allergies:  Allergies  Allergen Reactions  . Avocado Shortness Of Breath    Before 2007  . Banana  Anaphylaxis and Swelling  . Plantain Anaphylaxis and Swelling  . Ace Inhibitors Cough  . Codeine Other (See Comments)    Nausea and Feels Jittery  . Prednisone Swelling    Of face and lower extremities  . Tape Hives    Tape  Does better with paper tape  Tape  Does better with paper tape    . Latex Rash   Past Medical History:  Past Medical History:  Diagnosis Date  . Acute kidney injury ([place])   . Atrial fibrillation ([place])   . Cardiomyopathy ([place])   . CHF (congestive heart failure)  ([place])   . Chronic a-fib ([place]) 08/14/2016  . Chronic anticoagulation 08/14/2016  . Coronary artery disease    4v CABG 6/17  . Hypertension   . 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 Thyroid disease    Past Surgical History:  Past Surgical History:  Procedure Laterality Date  . ABDOMINAL HYSTERECTOMY    . CORONARY ARTERY BYPASS GRAFT    . CRANIOTOMY Right 07/30/2019   Right frontal  . NEPHRECTOMY Right 10/16/2019   Social History:  Social History   Socioeconomic History  . Marital status: Widowed    Spouse name: Not on file  . Number of children: 0  . Years of education: Doctorate  . Highest education level: Master's degree (e.g., MA, MS, MEng, MEd, MSW, MBA)  Occupational History  . Occupation: Retired Clinical biochemist  Tobacco Use  . Smoking status: Former Smoker    Types: Cigarettes    Quit date: 07/30/2014    Years since quitting: 5.8  . Smokeless tobacco: Never Used  Vaping Use  . Vaping Use: Never used  Substance and Sexual Activity  . Alcohol use: Yes    Comment: 1 glass wine twice weekly  . Drug use: No  . Sexual activity: Not on file  Other Topics Concern  . Not on file  Social History Narrative   Right handed   Coffee daily/tea qod   Social Determinants of Health   Financial Resource Strain: Not on file  Food Insecurity: Not on file  Transportation Needs: Not on file  Physical Activity: Not on file  Stress: Not on file  Social Connections: Not on file  Intimate Partner Violence: Not on file   Family History:  Family History  Problem Relation Age of Onset  . Heart attack Mother   . Heart attack Father   . Heart attack Sister   . Breast cancer Neg Hx     Review of Systems: Constitutional: Doesn't report fevers, chills or abnormal weight loss Eyes: Doesn't report blurriness of vision Ears, nose, mouth, throat, and face: Doesn't report sore throat Respiratory: Doesn't report cough, dyspnea or  wheezes Cardiovascular: Doesn't report palpitation, chest discomfort  Gastrointestinal:  Doesn't report nausea, constipation, diarrhea GU: Doesn't report incontinence Skin: Doesn't report skin rashes Neurological: Per HPI Musculoskeletal: Doesn't report joint pain Behavioral/Psych: Doesn't report anxiety  Physical Exam: Vitals:   06/17/20 1026  BP: 127/88  Pulse: 69  Resp: 17  Temp: (!) 97 F (36.1 C)  SpO2: 100%   KPS: 80. General: Alert, cooperative, pleasant, in no acute distress Head: Normal EENT: No conjunctival injection or scleral icterus.  Lungs: Resp effort normal Cardiac: Regular rate Abdomen: Non-distended abdomen Skin: No rashes cyanosis or petechiae. Extremities: No clubbing or edema  Neurologic Exam: Mental Status: Awake, alert, attentive to examiner. Oriented to self and environment. Language is fluent with intact comprehension.  Cranial Nerves: Visual  acuity is grossly normal. Visual fields are full. Extra-ocular movements intact. No ptosis. Face is symmetric Motor: Tone and bulk are normal. Power is full in both arms and legs. Reflexes are symmetric, no pathologic reflexes present.  Sensory: Intact to light touch Gait: Orthopedic limitations  Labs: I have reviewed the data as listed    Component Value Date/Time   NA 141 04/08/2020 0938   NA 140 08/24/2016 0930   K 4.0 04/08/2020 0938   CL 106 04/08/2020 0938   CO2 28 04/08/2020 0938   GLUCOSE 74 04/08/2020 0938   BUN 27 (H) 04/08/2020 0938   BUN 25 08/24/2016 0930   CREATININE 2.09 (H) 04/08/2020 0938   CALCIUM 9.3 04/08/2020 0938   PROT 7.1 04/08/2020 0938   ALBUMIN 3.9 04/08/2020 0938   AST 21 04/08/2020 0938   ALT 15 04/08/2020 0938   ALKPHOS 88 04/08/2020 0938   BILITOT 0.9 04/08/2020 0938   GFRNONAA 24 (L) 04/08/2020 0938   GFRAA 30 (L) 01/20/2020 1433   Lab Results  Component Value Date   WBC 5.8 04/08/2020   NEUTROABS 2.6 04/08/2020   HGB 10.9 (L) 04/08/2020   HCT 33.4 (L)  04/08/2020   MCV 73.7 (L) 04/08/2020   PLT 268 04/08/2020    Imaging:  [place] Clinician Interpretation: I have personally reviewed the CNS images as listed.  My interpretation, in the context of the patient's clinical presentation, is stable disease  MR Brain W Wo Contrast  Result Date: 06/15/2020 CLINICAL DATA:  Metastatic renal cell carcinoma. Follow-up response to treatment EXAM: MRI HEAD WITHOUT AND WITH CONTRAST TECHNIQUE: Multiplanar, multiecho pulse sequences of the brain and surrounding structures were obtained without and with intravenous contrast. CONTRAST:  80mL GADAVIST GADOBUTROL 1 MMOL/ML IV SOLN COMPARISON:  MRI head 02/20/2020 FINDINGS: Brain: Right convexity craniotomy. Surgical resection site right frontal convexity appears stable with mild peripheral enhancement which is unchanged. Previously noted dural based mass caudal to the resection site has resolved. There is mild dural enhancement on the right due to postoperative change without nodularity. No new areas of metastatic disease. Ventricle size normal. Chronic white matter changes. No acute infarct. Vascular: Normal arterial flow voids. Skull and upper cervical spine: Right-sided craniotomy. No evidence of metastatic disease to the calvarium Sinuses/Orbits: Paranasal sinuses clear.  Negative orbit Other: T2 hyperintensity in the left anterior parotid lobe shows enhancement with branching most consistent with prominent veins. Similar but smaller enhancing vessels right parotid. IMPRESSION: Stable resection site right frontal convexity. Previously noted dural based enhancing mass on the right has resolved. No new area of metastatic disease. Electronically Signed   By: Franchot Gallo M.D.   On: 06/15/2020 15:11   CUP PACEART REMOTE DEVICE CHECK  Result Date: 06/03/2020 Scheduled remote reviewed. Normal device function.  Known AF, programmed VVIR. Next remote 91 days. Kathy Breach, RN, CCDS, [employer] Protection last  programmed on Feb 20, 2020. Murlean Hark is disabled.   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  MRI demonstrates significant improvement.  Recommended continuing Keppra at 500mg  BID.  We appreciate the opportunity to participate in the care of Mirenda Baltazar.    We ask that Rem[place]ton Teters return to clinic in 3 months following next brain MRI, or sooner as needed.  All questions were answered. The patient knows to call the clinic with any problems, questions or concerns. No barriers to learning were detected.  The total time spent in the encounter was 30 minutes and more than 50% was on counseling and review of test results   Ventura Sellers, MD Medical Director of Neuro-Oncology [facility] at [place] 06/17/20 10:29 AM

## 2020-07-06 ENCOUNTER — Telehealth: Payer: Self-pay | Admitting: Oncology

## 2020-07-06 NOTE — Telephone Encounter (Signed)
Called pt per 2/14 sch msg - no answer. Left message for patient to call back to reschedule apt.

## 2020-07-07 ENCOUNTER — Encounter: Payer: Self-pay | Admitting: Internal Medicine

## 2020-07-07 NOTE — Progress Notes (Signed)
Note received from [facility] Dr. Candiss Norse.  Patient was seen 06/29/2020.  Patient with CKD stage IV.  Baseline creatinine range 1.9-2.1 post nephrectomy.  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  Patient referred to kidney options 1 class. Labs: BUN 37 creatinine 2.0, GFR 27 H/H 11.7/34.7 Microalbumin ratio 27.

## 2020-07-09 ENCOUNTER — Other Ambulatory Visit: Payer: Self-pay | Admitting: *Deleted

## 2020-07-09 ENCOUNTER — Other Ambulatory Visit: Payer: Self-pay

## 2020-07-09 ENCOUNTER — Ambulatory Visit (HOSPITAL_COMMUNITY)
Admission: RE | Admit: 2020-07-09 | Discharge: 2020-07-09 | Disposition: A | Discharge status: Home / Self Care | Payer: Medicare Other | Source: Ambulatory Visit | Attending: Oncology | Admitting: Oncology

## 2020-07-09 ENCOUNTER — Other Ambulatory Visit: Payer: Medicare Other

## 2020-07-09 ENCOUNTER — Inpatient Hospital Stay: Payer: Medicare Other | Attending: Oncology

## 2020-07-09 DIAGNOSIS — C649 Malignant neoplasm of unspecified kidney, except renal pelvis: Secondary | ICD-10-CM | POA: Diagnosis not present

## 2020-07-09 DIAGNOSIS — R918 Other nonspecific abnormal finding of lung field: Secondary | ICD-10-CM | POA: Insufficient documentation

## 2020-07-09 DIAGNOSIS — N2889 Other specified disorders of kidney and ureter: Secondary | ICD-10-CM | POA: Diagnosis present

## 2020-07-09 IMAGING — CT CT ABD-PELV W/O CM
2 of 4 series · 13 of 36 positions shown, 16 images · non-contrast
Comparison: [DATE]

CLINICAL DATA: Renal cell carcinoma.  Restaging.

EXAM:
CT CHEST, ABDOMEN AND PELVIS WITHOUT CONTRAST
TECHNIQUE: Multidetector CT imaging of the chest, abdomen and pelvis was
performed following the standard protocol without IV contrast.

[Series 2: cap w/o · axial · non-contrast · 0.74mm/px · z∈[+976,+1496]mm · 10 of 126 slices shown, 13 images]
[im 11/126  mediastinal]
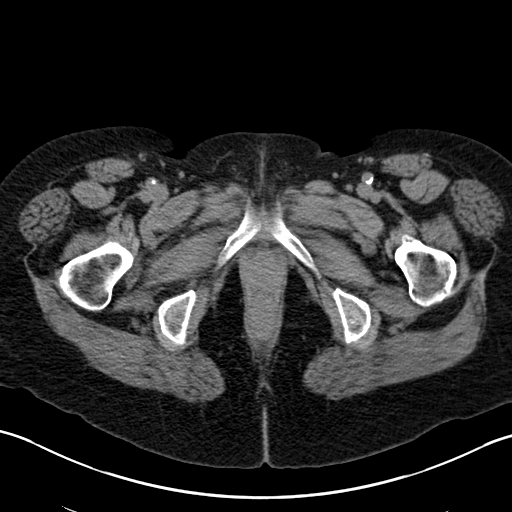
[im 11/126  lung]
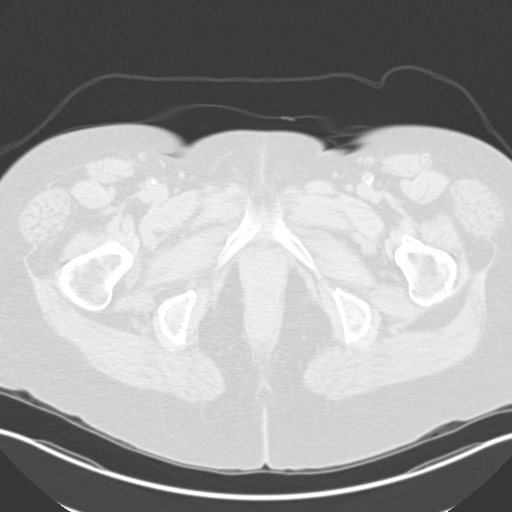
[im 21/126  lung]
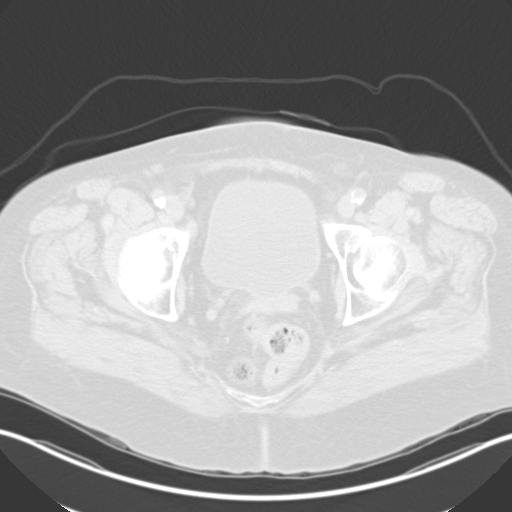
[im 32/126  lung]
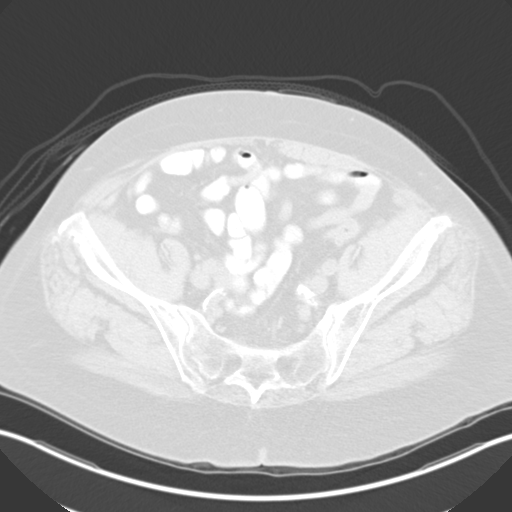
[im 42/126  lung]
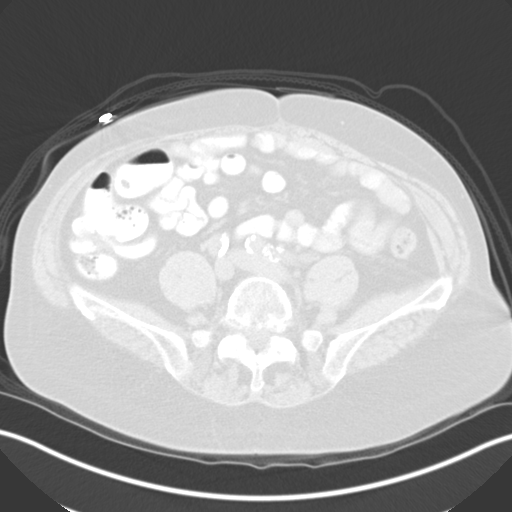
[im 53/126  mediastinal]
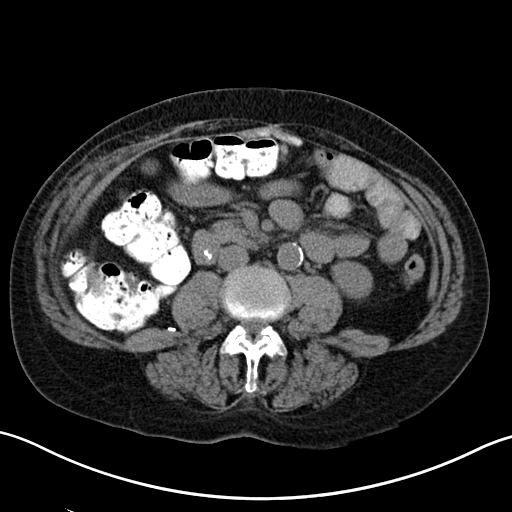
[im 53/126  lung]
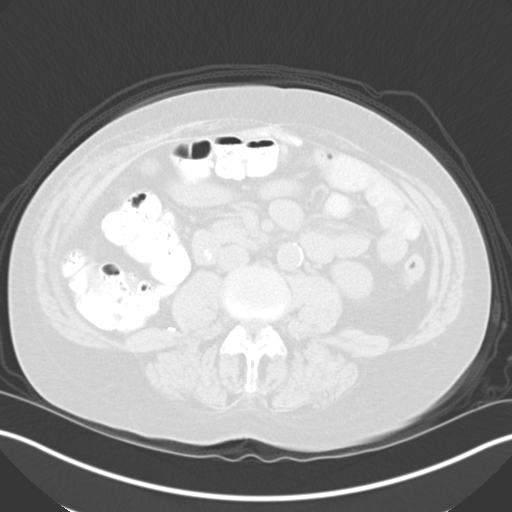
[im 73/126  lung]
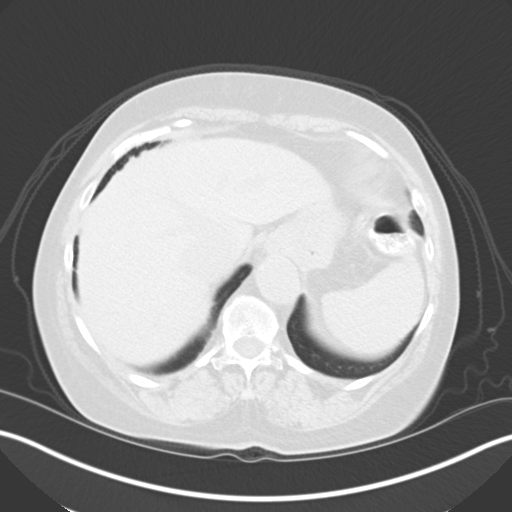
[im 84/126  lung]
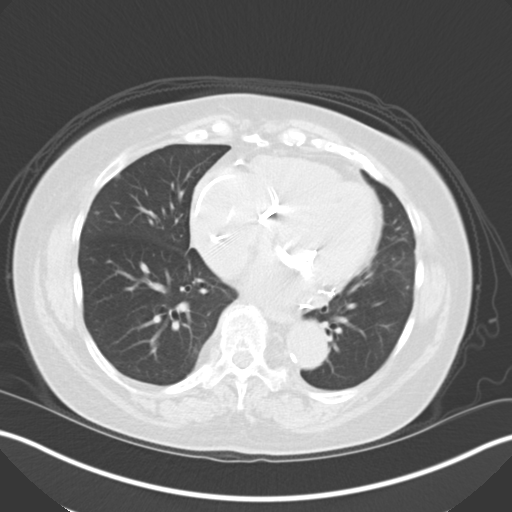
[im 94/126  lung]
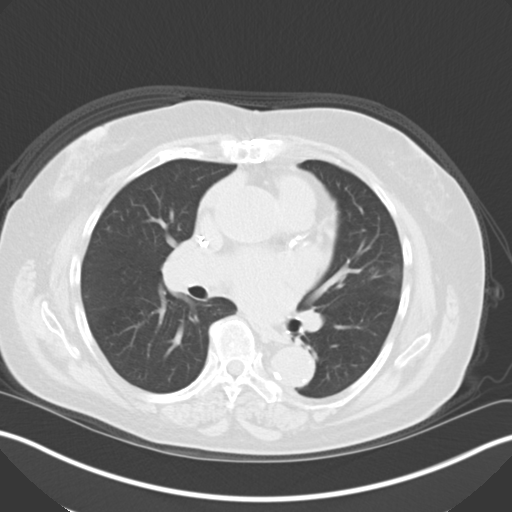
[im 105/126  mediastinal]
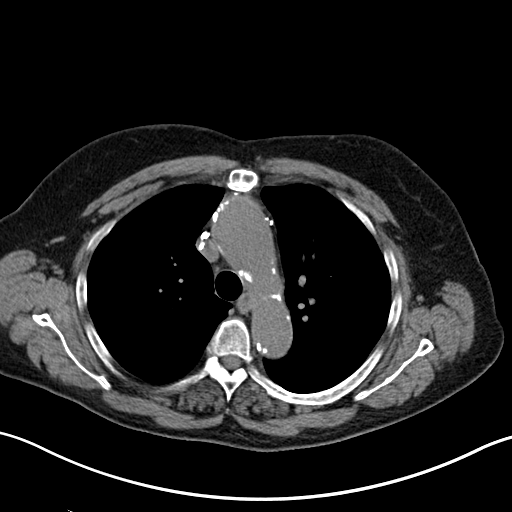
[im 105/126  lung]
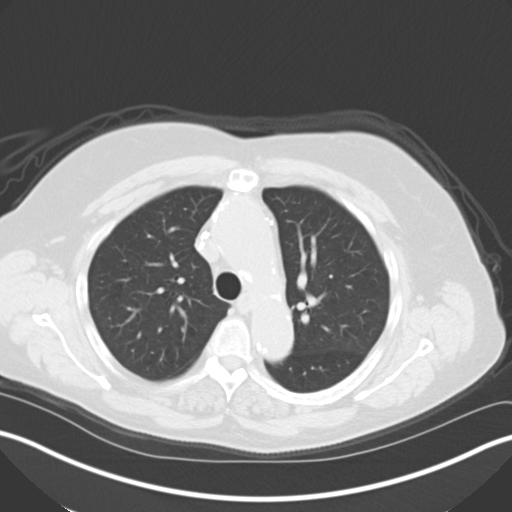
[im 115/126  lung]
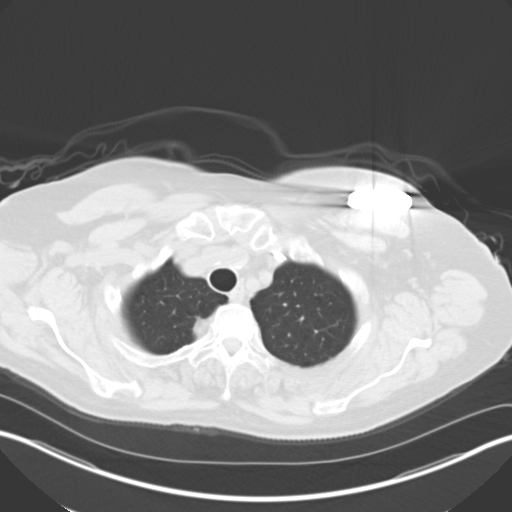

[Series 5: coronals · coronal · 0.77mm/px · 3 of 153 slices shown]
[im 31/153  lung]
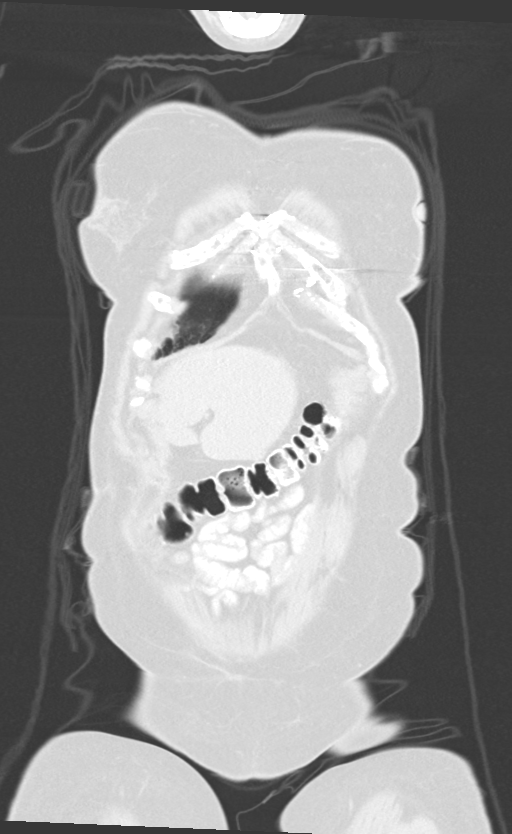
[im 61/153  lung]
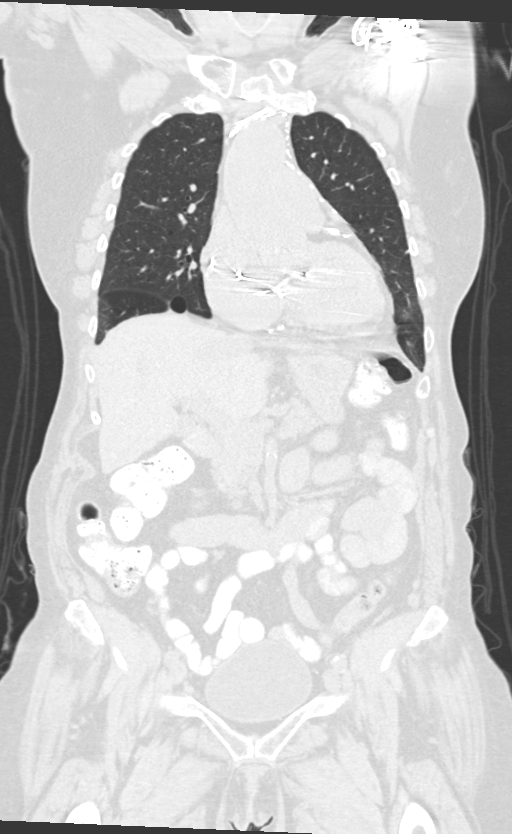
[im 92/153  lung]
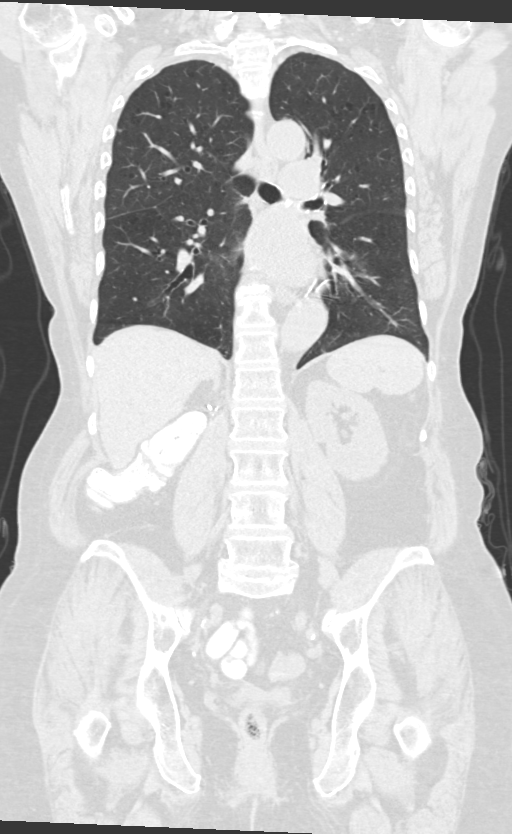

[13 of 36 positions shown; findings below may reference images not displayed]

FINDINGS: CT CHEST FINDINGS

Cardiovascular: The heart size is normal. No substantial pericardial
effusion. Coronary artery calcification is evident. Atherosclerotic
calcification is noted in the wall of the thoracic aorta. Ascending
thoracic aorta measures 4.5 cm diameter. Left permanent pacemaker
noted.

Mediastinum/Nodes: No mediastinal lymphadenopathy. No evidence for
gross hilar lymphadenopathy although assessment is limited by the
lack of intravenous contrast on today's study. The esophagus has
normal imaging features. There is no axillary lymphadenopathy.

Lungs/Pleura: As before, scattered tiny pulmonary nodules are again
noted. Dominant subpleural right middle lobe 4 mm nodule (104/4
today) described previously is stable. 4 mm subpleural right lower
lobe nodule on 104/4 is unchanged. No new suspicious pulmonary
nodule or mass. No focal airspace consolidation. No pleural
effusion.

Musculoskeletal: No worrisome lytic or sclerotic osseous
abnormality.

CT ABDOMEN PELVIS FINDINGS

Hepatobiliary: No focal abnormality in the liver on this study
without intravenous contrast. There is no evidence for gallstones,
gallbladder wall thickening, or pericholecystic fluid. No
intrahepatic or extrahepatic biliary dilation.

Pancreas: No focal mass lesion. No dilatation of the main duct. No
intraparenchymal cyst. No peripancreatic edema.

Spleen: No splenomegaly. No focal mass lesion.

Adrenals/Urinary Tract: No adrenal nodule or mass. Right kidney
surgically absent. No new or suspicious soft tissue in the right
nephrectomy bed. Left kidney unremarkable on noncontrast imaging. No
left hydroureter. The urinary bladder appears normal for the degree
of distention.

Stomach/Bowel: Tiny hiatal hernia. Stomach decompressed. Duodenum is
normally positioned as is the ligament of Treitz. No small bowel
wall thickening. No small bowel dilatation. The terminal ileum is
normal. The appendix is normal. No gross colonic mass. No colonic
wall thickening.

Vascular/Lymphatic: There is abdominal aortic atherosclerosis
without aneurysm. There is no gastrohepatic or hepatoduodenal
ligament lymphadenopathy. No retroperitoneal or mesenteric
lymphadenopathy. No pelvic sidewall lymphadenopathy.

Reproductive: The uterus is surgically absent. There is no adnexal
mass.

Other: No intraperitoneal free fluid.

Musculoskeletal: No worrisome lytic or sclerotic osseous
abnormality.
IMPRESSION: 1. Stable exam. Status post right nephrectomy without evidence for
local recurrence or metastatic disease in the chest, abdomen, or
pelvis.
2. Stable tiny bilateral pulmonary nodules. While likely benign,
continued attention on follow-up recommended.
3. Stable 4.5 cm diameter ascending thoracic aorta. Ascending
thoracic aortic aneurysm. Recommend semi-annual imaging followup by
CTA or MRA and referral to cardiothoracic surgery if not already
obtained. This recommendation follows [GZ]
ACCF/AHA/AATS/ACR/ASA/SCA/ALEKSEI/ALEKSEI/ALEKSEI/ALEKSEI Guidelines for the
Diagnosis and Management of Patients With Thoracic Aortic Disease.
Circulation. [GZ]; 121: [id number]. Aortic aneurysm NOS ([GZ]-[GZ])
4. Tiny hiatal hernia.
5. Aortic Atherosclerosis ([GZ]-[GZ]).

## 2020-07-13 ENCOUNTER — Other Ambulatory Visit: Payer: Self-pay

## 2020-07-13 ENCOUNTER — Ambulatory Visit (INDEPENDENT_AMBULATORY_CARE_PROVIDER_SITE_OTHER): Payer: Medicare Other | Admitting: Pharmacist Clinician (PhC)/ Clinical Pharmacy Specialist

## 2020-07-13 DIAGNOSIS — Z7901 Long term (current) use of anticoagulants: Secondary | ICD-10-CM | POA: Diagnosis not present

## 2020-07-13 DIAGNOSIS — I482 Chronic atrial fibrillation, unspecified: Secondary | ICD-10-CM | POA: Diagnosis not present

## 2020-07-13 LAB — POCT INR: INR: 5.1 — AB (ref 2.0–3.0)

## 2020-07-16 ENCOUNTER — Ambulatory Visit: Payer: Medicare Other | Admitting: Oncology

## 2020-07-27 ENCOUNTER — Other Ambulatory Visit: Payer: Self-pay

## 2020-07-27 ENCOUNTER — Ambulatory Visit (INDEPENDENT_AMBULATORY_CARE_PROVIDER_SITE_OTHER): Payer: Medicare Other | Admitting: Pharmacist Clinician (PhC)/ Clinical Pharmacy Specialist

## 2020-07-27 DIAGNOSIS — Z7901 Long term (current) use of anticoagulants: Secondary | ICD-10-CM | POA: Diagnosis not present

## 2020-07-27 DIAGNOSIS — I482 Chronic atrial fibrillation, unspecified: Secondary | ICD-10-CM

## 2020-07-27 LAB — POCT INR: INR: 2.3 (ref 2.0–3.0)

## 2020-07-29 ENCOUNTER — Other Ambulatory Visit: Payer: Self-pay | Admitting: Internal Medicine

## 2020-07-29 ENCOUNTER — Inpatient Hospital Stay: Payer: Medicare Other | Attending: Oncology

## 2020-07-29 ENCOUNTER — Other Ambulatory Visit: Payer: Self-pay

## 2020-07-29 DIAGNOSIS — Z79899 Other long term (current) drug therapy: Secondary | ICD-10-CM | POA: Diagnosis not present

## 2020-07-29 DIAGNOSIS — R918 Other nonspecific abnormal finding of lung field: Secondary | ICD-10-CM | POA: Diagnosis not present

## 2020-07-29 DIAGNOSIS — C7931 Secondary malignant neoplasm of brain: Secondary | ICD-10-CM | POA: Insufficient documentation

## 2020-07-29 DIAGNOSIS — Z905 Acquired absence of kidney: Secondary | ICD-10-CM | POA: Diagnosis not present

## 2020-07-29 DIAGNOSIS — C641 Malignant neoplasm of right kidney, except renal pelvis: Secondary | ICD-10-CM | POA: Diagnosis not present

## 2020-07-29 DIAGNOSIS — K449 Diaphragmatic hernia without obstruction or gangrene: Secondary | ICD-10-CM | POA: Diagnosis not present

## 2020-07-29 DIAGNOSIS — C649 Malignant neoplasm of unspecified kidney, except renal pelvis: Secondary | ICD-10-CM

## 2020-07-29 DIAGNOSIS — Z7901 Long term (current) use of anticoagulants: Secondary | ICD-10-CM | POA: Insufficient documentation

## 2020-07-29 DIAGNOSIS — Q043 Other reduction deformities of brain: Secondary | ICD-10-CM | POA: Insufficient documentation

## 2020-07-29 DIAGNOSIS — I700 Atherosclerosis of aorta: Secondary | ICD-10-CM | POA: Insufficient documentation

## 2020-07-29 LAB — CBC WITH DIFFERENTIAL (CANCER CENTER ONLY)
Abs Immature Granulocytes: 0.01 10*3/uL (ref 0.00–0.07)
Basophils Absolute: 0.1 10*3/uL (ref 0.0–0.1)
Basophils Relative: 1 %
Eosinophils Absolute: 0.1 10*3/uL (ref 0.0–0.5)
Eosinophils Relative: 2 %
HCT: 34.9 % — ABNORMAL LOW (ref 36.0–46.0)
Hemoglobin: 11.2 g/dL — ABNORMAL LOW (ref 12.0–15.0)
Immature Granulocytes: 0 %
Lymphocytes Relative: 37 %
Lymphs Abs: 2.2 10*3/uL (ref 0.7–4.0)
MCH: 23.6 pg — ABNORMAL LOW (ref 26.0–34.0)
MCHC: 32.1 g/dL (ref 30.0–36.0)
MCV: 73.5 fL — ABNORMAL LOW (ref 80.0–100.0)
Monocytes Absolute: 0.8 10*3/uL (ref 0.1–1.0)
Monocytes Relative: 14 %
Neutro Abs: 2.7 10*3/uL (ref 1.7–7.7)
Neutrophils Relative %: 46 %
Platelet Count: 348 10*3/uL (ref 150–400)
RBC: 4.75 MIL/uL (ref 3.87–5.11)
RDW: 14.1 % (ref 11.5–15.5)
WBC Count: 5.8 10*3/uL (ref 4.0–10.5)
nRBC: 0 % (ref 0.0–0.2)

## 2020-07-29 LAB — CMP (CANCER CENTER ONLY)
ALT: 11 U/L (ref 0–44)
AST: 16 U/L (ref 15–41)
Albumin: 4.1 g/dL (ref 3.5–5.0)
Alkaline Phosphatase: 96 U/L (ref 38–126)
Anion gap: 8 (ref 5–15)
BUN: 27 mg/dL — ABNORMAL HIGH (ref 8–23)
CO2: 26 mmol/L (ref 22–32)
Calcium: 9.6 mg/dL (ref 8.9–10.3)
Chloride: 104 mmol/L (ref 98–111)
Creatinine: 2.13 mg/dL — ABNORMAL HIGH (ref 0.44–1.00)
GFR, Estimated: 24 mL/min — ABNORMAL LOW (ref >60)
Glucose, Bld: 77 mg/dL (ref 70–99)
Potassium: 4 mmol/L (ref 3.5–5.1)
Sodium: 138 mmol/L (ref 135–145)
Total Bilirubin: 0.6 mg/dL (ref 0.3–1.2)
Total Protein: 7.8 g/dL (ref 6.5–8.1)

## 2020-07-29 MED ORDER — HYDRALAZINE HCL 10 MG PO TABS
10.0000 mg | ORAL_TABLET | Freq: Three times a day (TID) | ORAL | 0 refills | Status: DC
Start: 2020-07-29 — End: 2020-08-29

## 2020-07-29 NOTE — Telephone Encounter (Signed)
Copied from [place] [id number]. Topic: Quick Communication - Rx Refill/Question >> Jul 29, 2020  9:16 AM Leward Quan A wrote: Medication: hydrALAZINE (APRESOLINE) 10 MG tablet  Has the patient contacted their pharmacy? Yes.   (Agent: If no, request that the patient contact the pharmacy for the refill.) (Agent: If yes, when and what did the pharmacy advise?)  Preferred Pharmacy (with phone number or street name): [facility] # [place], [place]  Phone:  [phone] Fax:  [phone]     Agent: Please be advised that RX refills may take up to 3 business days. We ask that you follow-up with your pharmacy.

## 2020-07-29 NOTE — Telephone Encounter (Signed)
Copied from [place] [phone]. Topic: General - Other >> Jul 29, 2020  9:27 AM Leward Quan A wrote: Reason for CRM: Patient called in to inquire of Dr Wynetta Emery to please send her an Rx for Vitamin D 50,000 units to her pharmacy. Per patient she requested this a while ago and have not heard anything about it since. Please advise Ph# [phone]

## 2020-07-29 NOTE — Telephone Encounter (Signed)
Requested medication (s) are due for refill today: yes  Requested medication (s) are on the active medication list: yes  Last refill: ?  Future visit scheduled:yes  Notes to clinic: historical provider    Requested Prescriptions  Pending Prescriptions Disp Refills   hydrALAZINE (APRESOLINE) 10 MG tablet      Sig: Take 1 tablet (10 mg total) by mouth 3 (three) times daily.      Cardiovascular:  Vasodilators Failed - 07/29/2020  9:23 AM      Failed - HCT in normal range and within 360 days    HCT  Date Value Ref Range Status  04/08/2020 33.4 (L) 36.0 - 46.0 % Final          Failed - HGB in normal range and within 360 days    Hemoglobin  Date Value Ref Range Status  04/08/2020 10.9 (L) 12.0 - 15.0 g/dL Final    Comment:    Reticulocyte Hemoglobin testing may be clinically indicated, consider ordering this additional test [id number]           Passed - RBC in normal range and within 360 days    RBC  Date Value Ref Range Status  04/08/2020 4.53 3.87 - 5.11 MIL/uL Final          Passed - WBC in normal range and within 360 days    WBC  Date Value Ref Range Status  01/15/2020 7.3 4.0 - 10.5 K/uL Final   WBC Count  Date Value Ref Range Status  04/08/2020 5.8 4.0 - 10.5 K/uL Final          Passed - PLT in normal range and within 360 days    Platelet Count  Date Value Ref Range Status  04/08/2020 268 150 - 400 K/uL Final          Passed - Last BP in normal range    BP Readings from Last 1 Encounters:  06/17/20 127/88          Passed - Valid encounter within last 12 months    Recent Outpatient Visits           2 months ago Establishing care with new doctor, encounter for   [place], MD       Future Appointments             In 2 weeks Ladell Pier, MD [place]

## 2020-07-30 NOTE — Telephone Encounter (Signed)
Contacted pt and made aware that we will need to check her levels before sending a rx for VIt D in pt states she understands. Pt states she has an appt with provider on 3/28 and will talk to the provider then

## 2020-08-05 ENCOUNTER — Other Ambulatory Visit: Payer: Self-pay

## 2020-08-05 ENCOUNTER — Inpatient Hospital Stay (HOSPITAL_BASED_OUTPATIENT_CLINIC_OR_DEPARTMENT_OTHER): Payer: Medicare Other | Admitting: Oncology

## 2020-08-05 VITALS — BP 123/87 | HR 69 | Temp 97.6°F | Resp 15 | Ht 68.0 in | Wt 194.1 lb

## 2020-08-05 DIAGNOSIS — I2510 Atherosclerotic heart disease of native coronary artery without angina pectoris: Secondary | ICD-10-CM | POA: Diagnosis not present

## 2020-08-05 DIAGNOSIS — C649 Malignant neoplasm of unspecified kidney, except renal pelvis: Secondary | ICD-10-CM | POA: Diagnosis not present

## 2020-08-05 DIAGNOSIS — N2889 Other specified disorders of kidney and ureter: Secondary | ICD-10-CM | POA: Diagnosis not present

## 2020-08-05 DIAGNOSIS — C641 Malignant neoplasm of right kidney, except renal pelvis: Secondary | ICD-10-CM | POA: Diagnosis not present

## 2020-08-05 DIAGNOSIS — R918 Other nonspecific abnormal finding of lung field: Secondary | ICD-10-CM

## 2020-08-05 NOTE — Progress Notes (Signed)
Hematology and Oncology Follow Up Visit  Jacqueline Palmer [id number] Jul 24, 1945 75 y.o. 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  She developed a stage IV disease with isolated brain metastasis that was documented in January 2021.    Prior Therapy:  He status post right frontal craniotomy and resection of a solitary brain metastasis in March 2021.  He received postoperative SRS at [facility].  He status post a radical nephrectomy on Oct 16, 2019 with a T3a N0 disease.  The final pathology showed undetermined subtype with nuclear grade 3.  She is status post repeat stereotactic radiosurgery under the care of by Dr. Lisbeth Renshaw and Dr. Kathyrn Sheriff on March 03, 2020.  This was completed after she developed a 2.0 cm mass of the right frontal lobe indicating recurrent disease on MRI on February 03, 2020.  Current therapy: Active surveillance.  Interim History: Ms. Meditz presents today for repeat evaluation.  Since the last visit, she reports a feeling well without any major complaints.  She denies any nausea, vomiting or abdominal pain.  She denies any neurological deficits or changes in her mentation.  Her performance status quality of life remained excellent.  She ambulates with the help of a cane for the most part and occasionally a walker.     Medications: Updated on review. Current Outpatient Medications  Medication Sig Dispense Refill  . acetaminophen (TYLENOL) 500 MG tablet Take 1,000 mg by mouth in the morning and at bedtime.    . colchicine 0.6 MG tablet Take 1 tablet (0.6 mg total) by mouth 2 (two) times daily. (Patient taking differently: Take 0.3 mg by mouth every other day.) 14 tablet 0  . hydrALAZINE (APRESOLINE) 10 MG tablet Take 1 tablet (10 mg total) by mouth 3 (three) times daily. 90 tablet 0  . levETIRAcetam (KEPPRA) 500 MG tablet Take  1 tablet (500 mg total) by mouth 2 (two) times daily. 60 tablet 0  . levETIRAcetam (KEPPRA) 500 MG tablet One po bid 180 tablet 4  . levothyroxine (SYNTHROID, LEVOTHROID) 25 MCG tablet Take 25 mcg by mouth daily before breakfast.    . metoprolol succinate (TOPROL-XL) 25 MG 24 hr tablet Take 50 mg by mouth daily.    . nitroGLYCERIN (NITROSTAT) 0.4 MG SL tablet Place 0.4 mg under the tongue every 5 (five) minutes as needed for chest pain.     . polyethylene glycol (MIRALAX / GLYCOLAX) 17 g packet Take 17 g by mouth daily as needed for mild constipation.    . pravastatin (PRAVACHOL) 20 MG tablet Take 20 mg by mouth daily.    Marland Kitchen spironolactone (ALDACTONE) 25 MG tablet Take 25 mg by mouth daily.    Marland Kitchen torsemide (DEMADEX) 20 MG tablet Take 40 mg by mouth daily.    Marland Kitchen warfarin (COUMADIN) 5 MG tablet Take 1 tablet (5 mg total) by mouth See admin instructions. 2.5mg  Mon, [place] Thurs, Sat and 5mg  Wed, Fri, Sunday (Patient taking differently: Take 5 mg by mouth every evening.) 30 tablet 1   No current facility-administered medications for this visit.     Allergies:  Allergies  Allergen Reactions  . Avocado Shortness Of Breath    Before 2007  . Banana Anaphylaxis and Swelling  . Plantain Anaphylaxis and Swelling  . Ace Inhibitors Cough  . Codeine Other (See Comments)    Nausea and Feels Jittery  . Prednisone Swelling  Of face and lower extremities  . Tape Hives    Tape  Does better with paper tape  Tape  Does better with paper tape    . Latex Rash      Physical Exam: Blood pressure 123/87, pulse 69, temperature 97.6 F (36.4 C), temperature source Tympanic, resp. rate 15, height 5\' 8"  (1.727 m), weight 194 lb 1.6 oz (88 kg), SpO2 100 %.   ECOG: 1    General appearance: Alert, awake without any distress. Head: Atraumatic without abnormalities Oropharynx: Without any thrush or ulcers. Eyes: No scleral icterus. Lymph nodes: No lymphadenopathy noted in the cervical, supraclavicular,  or axillary nodes Heart:regular rate and rhythm, without any murmurs or gallops.   Lung: Clear to auscultation without any rhonchi, wheezes or dullness to percussion. Abdomin: Soft, nontender without any shifting dullness or ascites. Musculoskeletal: No clubbing or cyanosis. Neurological: No motor or sensory deficits. Skin: No rashes or lesions.      Lab Results: Lab Results  Component Value Date   WBC 5.8 07/29/2020   HGB 11.2 (L) 07/29/2020   HCT 34.9 (L) 07/29/2020   MCV 73.5 (L) 07/29/2020   PLT 348 07/29/2020     Chemistry      Component Value Date/Time   NA 138 07/29/2020 1054   NA 140 08/24/2016 0930   K 4.0 07/29/2020 1054   CL 104 07/29/2020 1054   CO2 26 07/29/2020 1054   BUN 27 (H) 07/29/2020 1054   BUN 25 08/24/2016 0930   CREATININE 2.13 (H) 07/29/2020 1054      Component Value Date/Time   CALCIUM 9.6 07/29/2020 1054   ALKPHOS 96 07/29/2020 1054   AST 16 07/29/2020 1054   ALT 11 07/29/2020 1054   BILITOT 0.6 07/29/2020 1054     IMPRESSION: 1. Stable exam. Status post right nephrectomy without evidence for local recurrence or metastatic disease in the chest, abdomen, or pelvis. 2. Stable tiny bilateral pulmonary nodules. While likely benign, continued attention on follow-up recommended. 3. Stable 4.5 cm diameter ascending thoracic aorta. Ascending thoracic aortic aneurysm. Recommend semi-annual imaging followup by CTA or MRA and referral to cardiothoracic surgery if not already obtained. This recommendation follows 2010 ACCF/AHA/AATS/ACR/ASA/SCA/SCAI/SIR/STS/SVM Guidelines for the Diagnosis and Management of Patients With Thoracic Aortic Disease. Circulation. 2010; 121: L572-I203. Aortic aneurysm NOS (ICD10-I71.9) 4. Tiny hiatal hernia. 5. Aortic Atherosclerosis (ICD10-I70.0).    Impression and Plan:  75 year old woman with:  1.    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  She developed stage IV with CNS disease in  February 2021.   Disease status was updated at this time and treatment options were reviewed.  Imaging studies on February 18 showed no evidence of metastatic disease at this time.  Systemic treatment options including cabozantinib and  Pembrolizumab were reviewed and will be deferred unless she has a static disease noted.  The response rates associated with these treatments are very limited will be deferred to only if needed.  The plan is to continue with active surveillance and repeat imaging studies in 6 months.  2.  Renal insufficiency: Kidney function remained stable at this time based on laboratory testing obtained on March 10 which showed creatinine clearance of 25 cc/min.  3.  CNS metastasis: MRI of the brain obtained on June 15, 2020 showed no evidence of any residual disease.  She continues to be on under active surveillance with Dr. Lisbeth Renshaw and Dr. Mickeal Skinner.  4.  Pain: Predominantly arthritic in nature and uses Tylenol  mostly.  5.  Follow-up: In 6 months for repeat follow-up after imaging studies.  30  minutes were dedicated to this visit.  Time was spent on reviewing her disease status. treatment options and outlining future plan of care.     Zola Button, MD 3/17/202212:29 PM

## 2020-08-16 ENCOUNTER — Ambulatory Visit: Payer: Medicare Other | Attending: Internal Medicine | Admitting: Internal Medicine

## 2020-08-16 DIAGNOSIS — I25718 Atherosclerosis of autologous vein coronary artery bypass graft(s) with other forms of angina pectoris: Secondary | ICD-10-CM

## 2020-08-16 DIAGNOSIS — Z85528 Personal history of other malignant neoplasm of kidney: Secondary | ICD-10-CM

## 2020-08-16 DIAGNOSIS — I10 Essential (primary) hypertension: Secondary | ICD-10-CM

## 2020-08-16 DIAGNOSIS — I5032 Chronic diastolic (congestive) heart failure: Secondary | ICD-10-CM

## 2020-08-16 DIAGNOSIS — M25511 Pain in right shoulder: Secondary | ICD-10-CM

## 2020-08-16 DIAGNOSIS — M130 Polyarthritis, unspecified: Secondary | ICD-10-CM

## 2020-08-16 DIAGNOSIS — Z1231 Encounter for screening mammogram for malignant neoplasm of breast: Secondary | ICD-10-CM

## 2020-08-16 DIAGNOSIS — N184 Chronic kidney disease, stage 4 (severe): Secondary | ICD-10-CM

## 2020-08-16 MED ORDER — SPIRONOLACTONE 25 MG PO TABS
25.0000 mg | ORAL_TABLET | Freq: Every day | ORAL | 3 refills | Status: DC
Start: 2020-08-16 — End: 2021-08-10

## 2020-08-16 NOTE — Progress Notes (Signed)
Virtual Visit via Telephone Note  I connected with Jacqueline Palmer on 08/16/20 at 11:54 a.m by telephone and verified that I am speaking with the correct person using two identifiers.  Location: Patient: home Provider: office  Participants: Myself Patient   I discussed the limitations, risks, security and privacy concerns of performing an evaluation and management service by telephone and the availability of in person appointments. I also discussed with the patient that there may be a patient responsible charge related to this service. The patient expressed understanding and agreed to proceed.   History of Present Illness: Patient with history of CAD with history CABGx4, chronic diastolic CHF/NICM with BV-ICD, HTN, rheumatic heart disease status post MVR replacement, permanent A. 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  Last visit 04/2020.  CAD/CHF/a.fib/HTN Reports CP one mth ago requiring SL Nitro Occasionally dizziness.  Goes away with deep breathing No lower extremity edema, shortness of breath, palpitations, bruising or bleeding on warfarin.  INR checked through cardiology. She checks blood pressure regularly.  Gives range 118-129/78-85 -no shocks from ICD Reports compliance with her medications including spironolactone, torsemide, pravastatin, hydralazine and metoprolol.  Some pain in joints mainly ankles and knees for which she takes Tylenol and Biofreeze. She has been getting occupational therapy 3 times a week in the right shoulder and sometimes the left.  She had lifted something heavy about 2 months ago and did a twisting motion of the right shoulder while lifting.  She has been having some pain in the shoulder since then.  Occupational Therapy, and use of Biofreeze are helping with the shoulder issue.  No recent  falls. Walks 1/2 mile 3 times a week  History of renal cell CA of the right kidney with brain mets: No seizures since last visit.  She continues to take the [place].  CKD 4: Informed her that I did receive notes from her nephrologist Dr. Candiss Norse regarding her visit with him last month.  Creatinine was 2.0 with GFR of 27.  More recent studies done this month revealed creatinine of 2.13 and GFR of 24.  Patient informed that I did receive her records from [facility].  Informed that I did not see any immunization record in there.  Patient states she has had her immunizations at [facility] including flu shot, 3 COVID-19 vaccines and the Pneumovax.  She states she will mail me a copy or bring it in with her on the next visit. Outpatient Encounter Medications as of 08/16/2020  Medication Sig  . acetaminophen (TYLENOL) 500 MG tablet Take 1,000 mg by mouth in the morning and at bedtime.  . colchicine 0.6 MG tablet Take 1 tablet (0.6 mg total) by mouth 2 (two) times daily. (Patient taking differently: Take 0.3 mg by mouth every other day.)  . hydrALAZINE (APRESOLINE) 10 MG tablet Take 1 tablet (10 mg total) by mouth 3 (three) times daily.  Marland Kitchen levETIRAcetam (KEPPRA) 500 MG tablet Take 1 tablet (500 mg total) by mouth 2 (two) times daily.  Marland Kitchen levETIRAcetam (KEPPRA) 500 MG tablet One po bid  . levothyroxine (SYNTHROID, LEVOTHROID) 25 MCG tablet Take 25 mcg by mouth daily before breakfast.  . metoprolol succinate (TOPROL-XL) 25 MG 24 hr tablet Take 50 mg by mouth daily.  . nitroGLYCERIN (NITROSTAT) 0.4 MG SL tablet Place 0.4 mg under the tongue every 5 (five) minutes as needed  for chest pain.   . polyethylene glycol (MIRALAX / GLYCOLAX) 17 g packet Take 17 g by mouth daily as needed for mild constipation.  . pravastatin (PRAVACHOL) 20 MG tablet Take 20 mg by mouth daily.  Marland Kitchen spironolactone (ALDACTONE) 25 MG tablet Take 25 mg by mouth daily.  Marland Kitchen torsemide (DEMADEX) 20 MG tablet Take 40 mg by mouth daily.  Marland Kitchen  warfarin (COUMADIN) 5 MG tablet Take 1 tablet (5 mg total) by mouth See admin instructions. 2.25m Mon, Tues Thurs, Sat and 589mWed, Fri, Sunday (Patient taking differently: Take 5 mg by mouth every evening.)   No facility-administered encounter medications on file as of 08/16/2020.      Observations/Objective: Results for orders placed or performed in visit on 07/29/20  CMP ([place])  Result Value Ref Range   Sodium 138 135 - 145 mmol/L   Potassium 4.0 3.5 - 5.1 mmol/L   Chloride 104 98 - 111 mmol/L   CO2 26 22 - 32 mmol/L   Glucose, Bld 77 70 - 99 mg/dL   BUN 27 (H) 8 - 23 mg/dL   Creatinine 2.13 (H) 0.44 - 1.00 mg/dL   Calcium 9.6 8.9 - 10.3 mg/dL   Total Protein 7.8 6.5 - 8.1 g/dL   Albumin 4.1 3.5 - 5.0 g/dL   AST 16 15 - 41 U/L   ALT 11 0 - 44 U/L   Alkaline Phosphatase 96 38 - 126 U/L   Total Bilirubin 0.6 0.3 - 1.2 mg/dL   GFR, Estimated 24 (L) >60 mL/min   Anion gap 8 5 - 15  CBC with Differential (Cancer Center Only)  Result Value Ref Range   WBC Count 5.8 4.0 - 10.5 K/uL   RBC 4.75 3.87 - 5.11 MIL/uL   Hemoglobin 11.2 (L) 12.0 - 15.0 g/dL   HCT 34.9 (L) 36.0 - 46.0 %   MCV 73.5 (L) 80.0 - 100.0 fL   MCH 23.6 (L) 26.0 - 34.0 pg   MCHC 32.1 30.0 - 36.0 g/dL   RDW 14.1 11.5 - 15.5 %   Platelet Count 348 150 - 400 K/uL   nRBC 0.0 0.0 - 0.2 %   Neutrophils Relative % 46 %   Neutro Abs 2.7 1.7 - 7.7 K/uL   Lymphocytes Relative 37 %   Lymphs Abs 2.2 0.7 - 4.0 K/uL   Monocytes Relative 14 %   Monocytes Absolute 0.8 0.1 - 1.0 K/uL   Eosinophils Relative 2 %   Eosinophils Absolute 0.1 0.0 - 0.5 K/uL   Basophils Relative 1 %   Basophils Absolute 0.1 0.0 - 0.1 K/uL   Immature Granulocytes 0 %   Abs Immature Granulocytes 0.01 0.00 - 0.07 K/uL     Assessment and Plan: 1. Essential hypertension Reported home blood pressure readings are good.  Continue current medications and low-salt diet.  2. Chronic diastolic heart failure ([place]. Coronary artery  disease of autologous vein bypass graft with stable angina pectoris (HCHousatonicStable and followed by cardiology.  Continue statin, beta-blocker, torsemide and spironolactone  4. History of renal cell cancer Followed by Dr. ShAlen Blew Recent MRI of the brain done in January of this year showed no evidence of any residual disease.  5. Polyarthritis Continue Tylenol and Biofreeze.  6. Acute pain of right shoulder Doing well with occupational therapy.  Continue Tylenol and Biofreeze  7. CKD (chronic kidney disease) stage 4, GFR 15-29 ml/min (HCC) GFR stable.  Continue to avoid NSAIDs Followed by nephrology  8.  Encounter for screening mammogram for malignant neoplasm of breast - MM Digital Screening; Future   Follow Up Instructions: 4 mths   I discussed the assessment and treatment plan with the patient. The patient was provided an opportunity to ask questions and all were answered. The patient agreed with the plan and demonstrated an understanding of the instructions.   The patient was advised to call back or seek an in-person evaluation if the symptoms worsen or if the condition fails to improve as anticipated.  I spent 14 minutes on this telephone encounter.  Karle Plumber, MD

## 2020-08-20 ENCOUNTER — Telehealth (INDEPENDENT_AMBULATORY_CARE_PROVIDER_SITE_OTHER): Payer: Self-pay

## 2020-08-20 NOTE — Telephone Encounter (Signed)
-----   Message from Ladell Pier, MD sent at 08/16/2020  5:45 PM EDT ----- Give f/u in 4 1/2 mths in person.  Give first appt of morning or first appt of afternoon

## 2020-08-20 NOTE — Telephone Encounter (Signed)
Left message asking patient to return call to office to schedule appointment. Nat Christen, CMA

## 2020-08-24 ENCOUNTER — Other Ambulatory Visit: Payer: Self-pay

## 2020-08-24 ENCOUNTER — Ambulatory Visit (INDEPENDENT_AMBULATORY_CARE_PROVIDER_SITE_OTHER): Payer: Medicare Other | Admitting: Pharmacist Clinician (PhC)/ Clinical Pharmacy Specialist

## 2020-08-24 DIAGNOSIS — I482 Chronic atrial fibrillation, unspecified: Secondary | ICD-10-CM | POA: Diagnosis not present

## 2020-08-24 DIAGNOSIS — Z7901 Long term (current) use of anticoagulants: Secondary | ICD-10-CM

## 2020-08-24 LAB — POCT INR: INR: 3.7 — AB (ref 2.0–3.0)

## 2020-08-28 ENCOUNTER — Other Ambulatory Visit: Payer: Self-pay | Admitting: Internal Medicine

## 2020-08-29 NOTE — Telephone Encounter (Signed)
Requested Prescriptions  Pending Prescriptions Disp Refills  . hydrALAZINE (APRESOLINE) 10 MG tablet [Pharmacy Med Name: hydrALAZINE HCl Oral Tablet 10 MG] 90 tablet 3    Sig: TAKE ONE TABLET BY MOUTH THREE TIMES DAILY     Cardiovascular:  Vasodilators Failed - 08/28/2020  8:33 PM      Failed - HCT in normal range and within 360 days    HCT  Date Value Ref Range Status  07/29/2020 34.9 (L) 36.0 - 46.0 % Final         Failed - HGB in normal range and within 360 days    Hemoglobin  Date Value Ref Range Status  07/29/2020 11.2 (L) 12.0 - 15.0 g/dL Final         Passed - RBC in normal range and within 360 days    RBC  Date Value Ref Range Status  07/29/2020 4.75 3.87 - 5.11 MIL/uL Final         Passed - WBC in normal range and within 360 days    WBC  Date Value Ref Range Status  01/15/2020 7.3 4.0 - 10.5 K/uL Final   WBC Count  Date Value Ref Range Status  07/29/2020 5.8 4.0 - 10.5 K/uL Final         Passed - PLT in normal range and within 360 days    Platelet Count  Date Value Ref Range Status  07/29/2020 348 150 - 400 K/uL Final         Passed - Last BP in normal range    BP Readings from Last 1 Encounters:  08/05/20 123/87         Passed - Valid encounter within last 12 months    Recent Outpatient Visits          1 week ago Essential hypertension   [place], MD   3 months ago Establishing care with new doctor, encounter for   [place] Ladell Pier, MD

## 2020-09-02 ENCOUNTER — Ambulatory Visit (INDEPENDENT_AMBULATORY_CARE_PROVIDER_SITE_OTHER): Payer: Medicare Other

## 2020-09-02 DIAGNOSIS — I428 Other cardiomyopathies: Secondary | ICD-10-CM | POA: Diagnosis not present

## 2020-09-02 LAB — CUP PACEART REMOTE DEVICE CHECK
Battery Remaining Longevity: 60 mo
Battery Remaining Percentage: 70 %
Brady Statistic RA Percent Paced: 0 %
Brady Statistic RV Percent Paced: 100 %
Date Time Interrogation Session: 20220414000100
HighPow Impedance: 83 Ohm
Implantable Lead Implant Date: 20180423
Implantable Lead Implant Date: 20180423
Implantable Lead Implant Date: 20180423
Implantable Lead Location: 753858
Implantable Lead Location: 753859
Implantable Lead Location: 753860
Implantable Lead Model: 292
Implantable Lead Model: 4672
Implantable Lead Model: 7740
Implantable Lead Serial Number: 431793
Implantable Lead Serial Number: 602825
Implantable Lead Serial Number: 703305
Implantable Pulse Generator Implant Date: 20180423
Lead Channel Impedance Value: 1093 Ohm
Lead Channel Impedance Value: 397 Ohm
Lead Channel Impedance Value: 530 Ohm
Lead Channel Pacing Threshold Amplitude: 0.5 V
Lead Channel Pacing Threshold Amplitude: 0.6 V
Lead Channel Pacing Threshold Pulse Width: 0.4 ms
Lead Channel Pacing Threshold Pulse Width: 0.4 ms
Lead Channel Setting Pacing Amplitude: 1.5 V
Lead Channel Setting Pacing Amplitude: 2 V
Lead Channel Setting Pacing Pulse Width: 0.4 ms
Lead Channel Setting Pacing Pulse Width: 0.4 ms
Lead Channel Setting Sensing Sensitivity: 0.6 mV
Lead Channel Setting Sensing Sensitivity: 1 mV
Pulse Gen Serial Number: 174046

## 2020-09-06 ENCOUNTER — Telehealth: Payer: Self-pay | Admitting: Internal Medicine

## 2020-09-06 NOTE — Telephone Encounter (Signed)
-----   Message from Ladell Pier, MD sent at 08/16/2020  5:45 PM EDT ----- Give f/u in 4 1/2 mths in person.  Give first appt of morning or first appt of afternoon

## 2020-09-06 NOTE — Telephone Encounter (Signed)
Spoke with Pt and she is scheduled for 7/26 with Dr. Wynetta Emery. Offered her th 8:30 appt but patient request appt after 9am

## 2020-09-14 ENCOUNTER — Ambulatory Visit (INDEPENDENT_AMBULATORY_CARE_PROVIDER_SITE_OTHER): Payer: Medicare Other | Admitting: Pharmacist Clinician (PhC)/ Clinical Pharmacy Specialist

## 2020-09-14 ENCOUNTER — Other Ambulatory Visit: Payer: Self-pay

## 2020-09-14 DIAGNOSIS — I482 Chronic atrial fibrillation, unspecified: Secondary | ICD-10-CM

## 2020-09-14 DIAGNOSIS — Z7901 Long term (current) use of anticoagulants: Secondary | ICD-10-CM

## 2020-09-14 LAB — POCT INR: INR: 2.9 (ref 2.0–3.0)

## 2020-09-16 ENCOUNTER — Ambulatory Visit: Payer: Medicare Other | Admitting: Medical

## 2020-09-16 ENCOUNTER — Ambulatory Visit: Payer: Medicare Other | Admitting: Internal Medicine

## 2020-09-20 NOTE — Progress Notes (Signed)
Remote ICD transmission.   

## 2020-09-21 ENCOUNTER — Other Ambulatory Visit: Payer: Self-pay | Admitting: Radiation Therapy

## 2020-09-28 ENCOUNTER — Telehealth: Payer: Self-pay | Admitting: Internal Medicine

## 2020-09-28 NOTE — Telephone Encounter (Signed)
Contacted pt to schedule a virtual appt with Dr. Wynetta Emery for pcs services. Pt didn't answer lvm

## 2020-09-28 NOTE — Telephone Encounter (Signed)
Philippa Sicks from [employer] dropped off orders to be signed by provider for patient. Rollene Fare requested once orders were signed if she  could be called to pick them up. Her business card is attached to orders  (phone #: [phone]). Papers placed in PCP box.

## 2020-09-28 NOTE — Telephone Encounter (Signed)
Dr. Wynetta Emery will be able to sign form without visit. Will fax form once completed

## 2020-09-30 ENCOUNTER — Other Ambulatory Visit: Payer: Self-pay

## 2020-09-30 ENCOUNTER — Ambulatory Visit (HOSPITAL_COMMUNITY)
Admission: RE | Admit: 2020-09-30 | Discharge: 2020-09-30 | Disposition: A | Discharge status: Home / Self Care | Payer: Medicare Other | Source: Ambulatory Visit | Attending: Internal Medicine | Admitting: Internal Medicine

## 2020-09-30 DIAGNOSIS — C7931 Secondary malignant neoplasm of brain: Secondary | ICD-10-CM | POA: Diagnosis not present

## 2020-09-30 IMAGING — MR MR HEAD WO/W CM
10 of 13 series · 22 of 48 positions shown · IV contrast (gadavist)
Comparison: [DATE]

CLINICAL DATA: Follow-up metastatic renal cell carcinoma. Resection
postoperative SRS in [DATE] and subsequent salvage SRS in [DATE].

EXAM:
MRI HEAD WITHOUT AND WITH CONTRAST
TECHNIQUE: Multiplanar, multiecho pulse sequences of the brain and surrounding
structures were obtained without and with intravenous contrast.
CONTRAST:  8mL GADAVIST GADOBUTROL 1 MMOL/ML IV SOLN

[Series 4: DWI · axial · 5.0mm · 1.09mm/px · z∈[-107,+80]mm · 3 of 70 slices shown (1 of 2)]
[im 1/70]
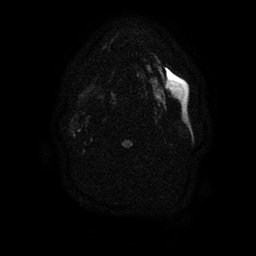
[im 35/70]
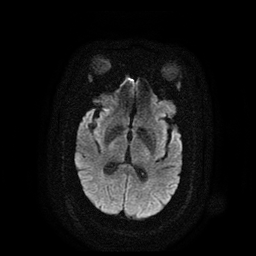
[im 70/70]
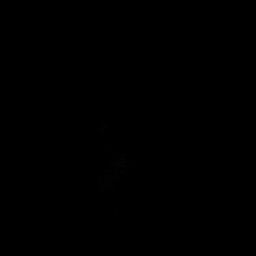

[Series 5: FLAIR · sagittal · 3.0mm · 0.51mm/px · 2 of 53 slices shown (1 of 2)]
[im 1/53]
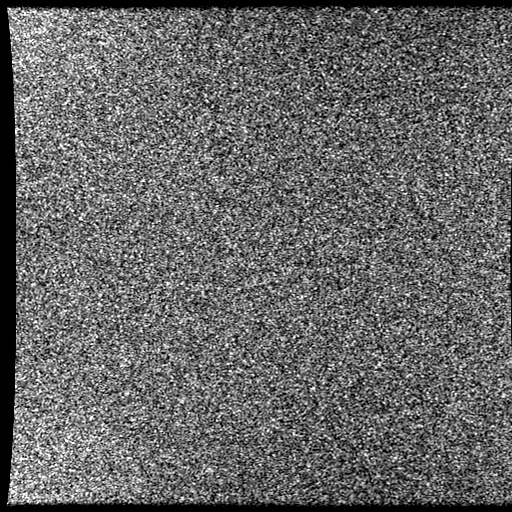
[im 53/53]
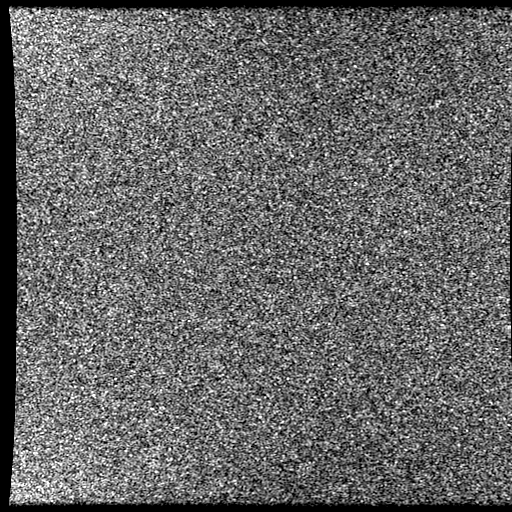

[Series 6: FLAIR · axial · 5.0mm · 0.49mm/px · 1 of 32 slices shown (2 of 2)]
[im 1/32]
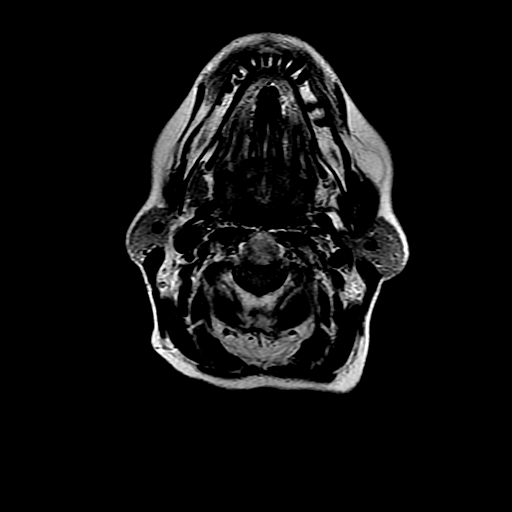

[Series 7: ax mpgr · axial · 5.0mm · 0.47mm/px · z∈[-108,+78]mm · 2 of 32 slices shown]
[im 1/32]
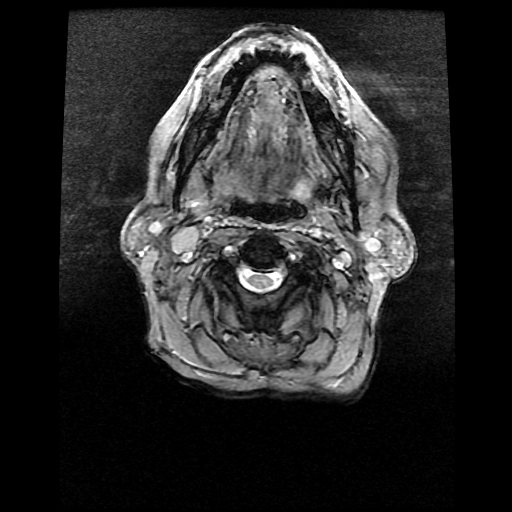
[im 32/32]
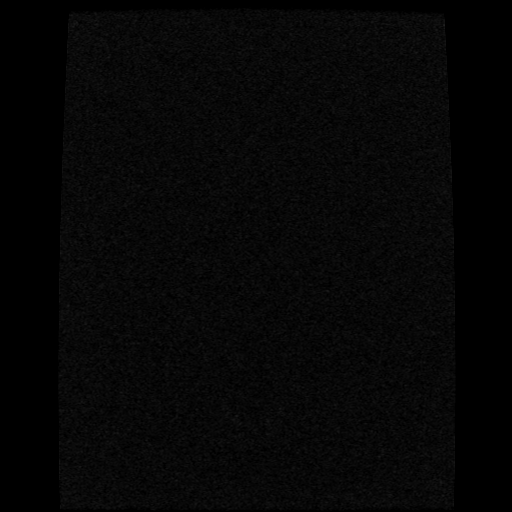

[Series 9: T2 post-contrast · coronal · 5.0mm · 0.43mm/px · 2 of 40 slices shown]
[im 1/40]
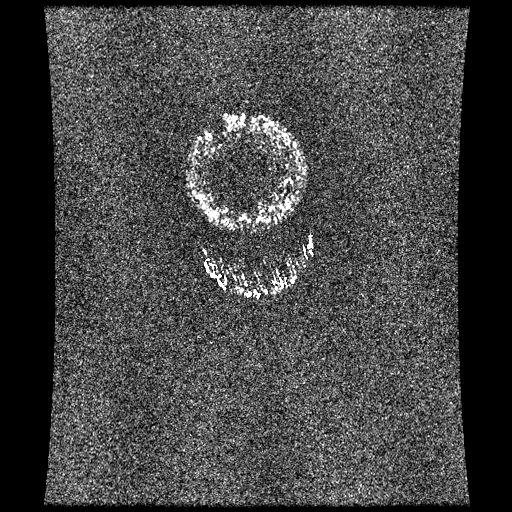
[im 40/40]
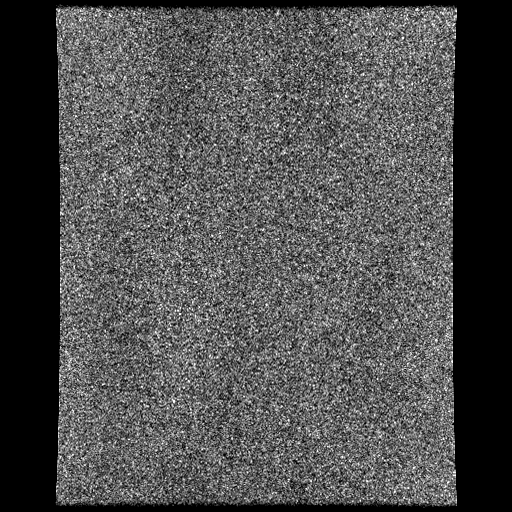

[Series 10: T2 · axial · 5.0mm · 0.49mm/px · z∈[-108,+78]mm · 2 of 32 slices shown]
[im 1/32]
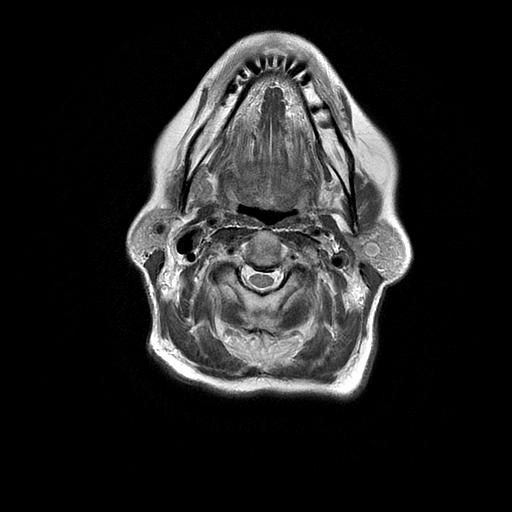
[im 32/32]
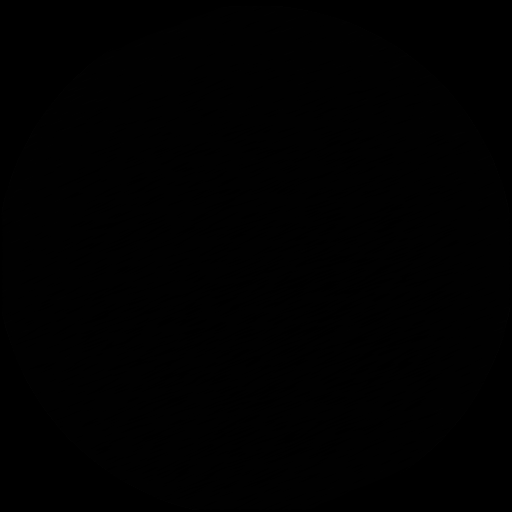

[Series 12: T1 post-contrast · coronal · 5.0mm · 0.43mm/px · 2 of 40 slices shown]
[im 1/40]
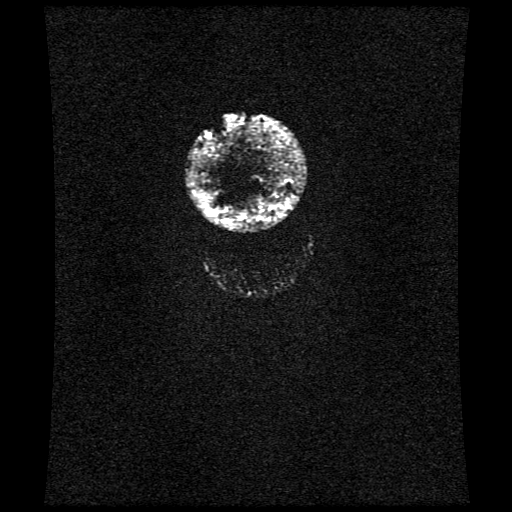
[im 40/40]
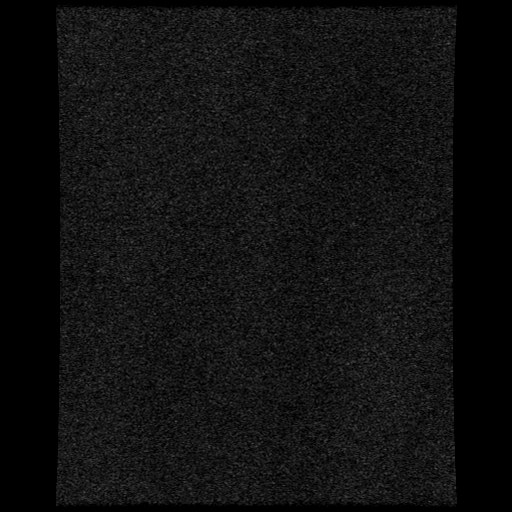

[Series 13: FLAIR post-contrast · sagittal · 3.0mm · 0.51mm/px · 3 of 53 slices shown]
[im 1/53]
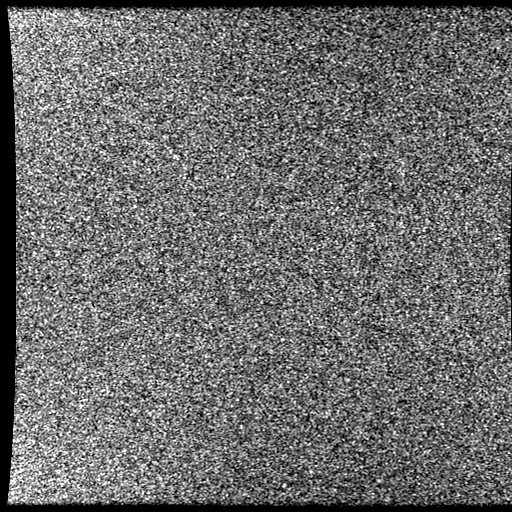
[im 27/53]
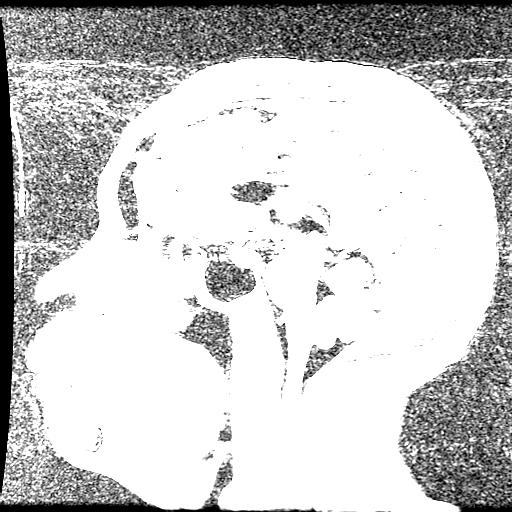
[im 53/53]
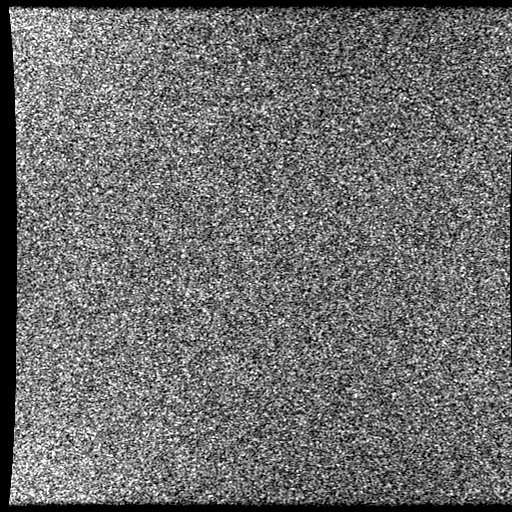

[Series 401: DWI · axial · 5.0mm · 1.09mm/px · z∈[-107,+69]mm · 2 of 32 slices shown (2 of 2)]
[im 1/32]
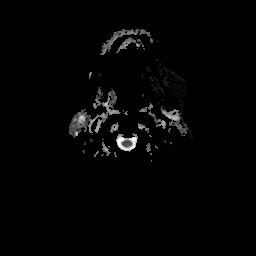
[im 32/32]
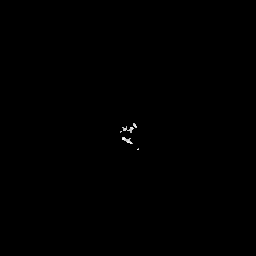

[Series 1100: processed images · axial · 1.0mm · 0.51mm/px · z∈[-9,+120]mm · 3 of 93 slices shown]
[im 1/93]
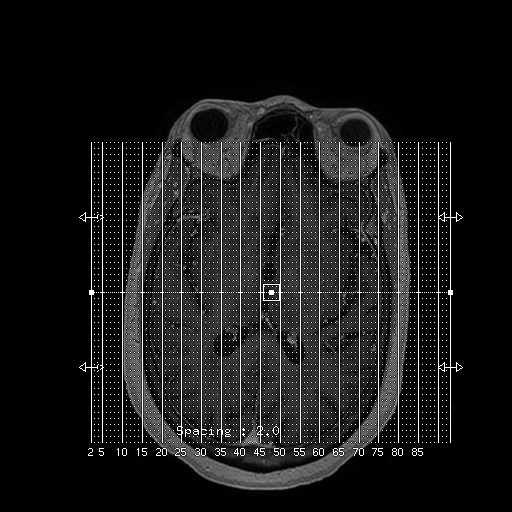
[im 24/93]
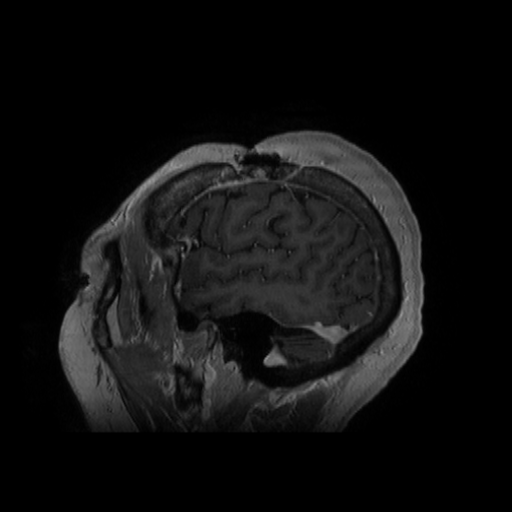
[im 47/93]
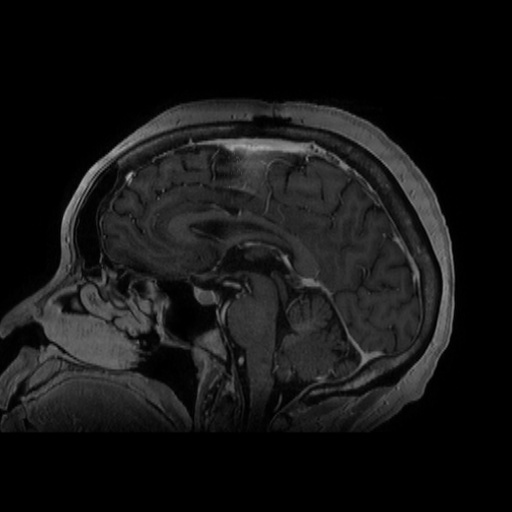

[22 of 48 positions shown; findings below may reference images not displayed]

FINDINGS: Brain:

New lesions: None.

Larger lesions: None.

Stable or smaller lesions:

1. The resection cavity in the high posterior right frontal lobe has
mildly collapsed in the interim with similar volume of nonsuspicious
peripheral enhancement (series 11, image 131). There are associated
chronic blood products. Mild surrounding T2 hyperintensity is stable
to minimally increased. Overlying mild dural enhancement is
unchanged without recurrence of the dural-based convexity mass
inferior to the resection site on the [DATE] study.

Other brain findings: There is no evidence of an acute infarct,
midline shift, or extra-axial fluid collection. Scattered chronic
microhemorrhages are again seen in the cerebrum and cerebellum.
Patchy T2 hyperintensities in the cerebral white matter bilaterally
are unchanged and nonspecific but compatible with a combination of
moderate chronic small vessel ischemic disease and post treatment
changes. Chronic lacunar infarcts are again seen in the deep
cerebral white matter/basal ganglia regions, thalami, and
cerebellum. There is mild cerebral atrophy.

Vascular: Major intracranial vascular flow voids are preserved.

Skull and upper cervical spine: Right frontoparietal craniotomy. No
suspicious marrow lesion.

Sinuses/Orbits: Unchanged appearance of the orbits with prominent
orbital fat bilaterally, bilateral proptosis, and medial bulging of
the right lamina papyracea in this patient with a history of thyroid
disease. Paranasal sinuses and mastoid air cells are clear.

Other: None.
IMPRESSION: Post treatment changes in the right frontal lobe without evidence of
progressive or new intracranial metastatic disease.

## 2020-09-30 MED ORDER — GADOBUTROL 1 MMOL/ML IV SOLN
8.0000 mL | Freq: Once | INTRAVENOUS | Status: AC | PRN
  Administered 2020-09-30: 8 mL via INTRAVENOUS

## 2020-09-30 NOTE — Progress Notes (Signed)
[facility] today at [facility] for [facility] brain w/wo contrast. Patient has [employer]. Orders obtained with Joey-rep via [facility]. VOO 85. Renee-cardiology PA made aware

## 2020-10-04 ENCOUNTER — Other Ambulatory Visit: Payer: Self-pay | Admitting: Internal Medicine

## 2020-10-04 MED ORDER — COLCHICINE 0.6 MG PO TABS: 0.6000 mg | ORAL_TABLET | Freq: Two times a day (BID) | ORAL | 0 refills | Status: DC

## 2020-10-04 NOTE — Telephone Encounter (Signed)
   Notes to clinic: medication filled by a different provider  Review for refill    Requested Prescriptions  Pending Prescriptions Disp Refills   colchicine 0.6 MG tablet 14 tablet 0    Sig: Take 1 tablet (0.6 mg total) by mouth 2 (two) times daily.      Endocrinology:  Gout Agents Failed - 10/04/2020 12:35 PM      Failed - Uric Acid in normal range and within 360 days    Uric Acid, Serum  Date Value Ref Range Status  08/11/2016 13.3 (H) 2.3 - 6.6 mg/dL Final          Failed - Cr in normal range and within 360 days    Creatinine  Date Value Ref Range Status  07/29/2020 2.13 (H) 0.44 - 1.00 mg/dL Final          Passed - Valid encounter within last 12 months    Recent Outpatient Visits           1 month ago Essential hypertension   [place], MD   5 months ago Establishing care with new doctor, encounter for   [place], MD       Future Appointments             In 2 months Ladell Pier, MD [place]

## 2020-10-04 NOTE — Telephone Encounter (Signed)
Copied from [place] [id number]. Topic: Quick Communication - Rx Refill/Question >> Oct 04, 2020 12:16 PM Leward Quan A wrote: Medication: colchicine 0.6 MG tablet  Has the patient contacted their pharmacy? Yes.   (Agent: If no, request that the patient contact the pharmacy for the refill.) (Agent: If yes, when and what did the pharmacy advise?)  Preferred Pharmacy (with phone number or street name): [facility] # [place], [place]  Phone:  [phone] Fax:  [phone]     Agent: Please be advised that RX refills may take up to 3 business days. We ask that you follow-up with your pharmacy.

## 2020-10-05 ENCOUNTER — Inpatient Hospital Stay: Payer: Medicare Other | Attending: Oncology | Admitting: Internal Medicine

## 2020-10-05 ENCOUNTER — Other Ambulatory Visit: Payer: Self-pay

## 2020-10-05 VITALS — BP 132/82 | HR 70 | Temp 97.5°F | Resp 16 | Ht 68.0 in | Wt 192.5 lb

## 2020-10-05 DIAGNOSIS — C7931 Secondary malignant neoplasm of brain: Secondary | ICD-10-CM | POA: Diagnosis not present

## 2020-10-05 DIAGNOSIS — Z7901 Long term (current) use of anticoagulants: Secondary | ICD-10-CM | POA: Insufficient documentation

## 2020-10-05 DIAGNOSIS — Z951 Presence of aortocoronary bypass graft: Secondary | ICD-10-CM | POA: Insufficient documentation

## 2020-10-05 DIAGNOSIS — M25511 Pain in right shoulder: Secondary | ICD-10-CM | POA: Diagnosis not present

## 2020-10-05 DIAGNOSIS — Z87891 Personal history of nicotine dependence: Secondary | ICD-10-CM | POA: Diagnosis not present

## 2020-10-05 DIAGNOSIS — G96198 Other disorders of meninges, not elsewhere classified: Secondary | ICD-10-CM | POA: Diagnosis not present

## 2020-10-05 DIAGNOSIS — M79604 Pain in right leg: Secondary | ICD-10-CM | POA: Insufficient documentation

## 2020-10-05 DIAGNOSIS — Z79899 Other long term (current) drug therapy: Secondary | ICD-10-CM | POA: Insufficient documentation

## 2020-10-05 DIAGNOSIS — C641 Malignant neoplasm of right kidney, except renal pelvis: Secondary | ICD-10-CM | POA: Insufficient documentation

## 2020-10-05 NOTE — Progress Notes (Signed)
[facility] [place], [place] [address] [phone]   Interval Evaluation  Date of Service: 10/05/20 Patient Name: Jacqueline Palmer Patient MRN: [id number] Patient DOB: 1945/06/05 Provider: Ventura Sellers, MD  Identifying Statement:  Jacqueline Palmer is a 75 y.o. female with Brain metastasis [facility]) [C79.31]   Primary Cancer: Renal Cell Carcinoma, Stage IV  Oncologic History: 07/30/19: Right frontal craniotomy, resection of solitary brain metastasis at [facility] 10/06/19: Post-operative SRS at [place] (Dr. Tyrone Nine) 03/09/20: Salvage SRS to R frontal+LMD 27/3x  Interval History:  [name] presents today for follow up after recent MRI brain.  No recent seizures, continues to take Keppra 500mg  twice per day.  She does describe new onset pain in her right leg, as well as right shoulder and upper back.  Pain is not well characterized, but it does radiate from her lower back "down my leg".  It is not associated with numbness or frank weakness.  Symptoms in the arm are of milder nature.  Frequency is daily, duration is roughly 1 month.  Continues to be active mentally and engaging socially; also working with PT and OT at her residential facility.  H+P (02/24/20) Patient presents to review recent changes on brain MRI.  She initially presented in March 2021 with a seizure, described as "left side clenching up, lost awareness for a few minutes".  CNS imaging demonstrated an enhancing right frontal mass, c/w likely metastasis from renal cell carcinoma.  Craniotomy was performed at [facility] and followed with surgical bed radiosurgery at [facility].  She did have one recurrence of seizure activity in late August, at which time she was restarted on Keppra.  Unclear on why this was discontinued.  Recent MRI demonstrated some change and she presents today for treatment recommendations.  No neurologic complaints aside from seizures.  Medications: Current Outpatient  Medications on File Prior to Visit  Medication Sig Dispense Refill  . acetaminophen (TYLENOL) 500 MG tablet Take 1,000 mg by mouth in the morning and at bedtime.    . colchicine 0.6 MG tablet Take 1 tablet (0.6 mg total) by mouth 2 (two) times daily. 14 tablet 0  . hydrALAZINE (APRESOLINE) 10 MG tablet TAKE ONE TABLET BY MOUTH THREE TIMES DAILY 270 tablet 1  . levETIRAcetam (KEPPRA) 500 MG tablet Take 1 tablet (500 mg total) by mouth 2 (two) times daily. 60 tablet 0  . levETIRAcetam (KEPPRA) 500 MG tablet One po bid 180 tablet 4  . levothyroxine (SYNTHROID, LEVOTHROID) 25 MCG tablet Take 25 mcg by mouth daily before breakfast.    . metoprolol succinate (TOPROL-XL) 25 MG 24 hr tablet Take 50 mg by mouth daily.    . nitroGLYCERIN (NITROSTAT) 0.4 MG SL tablet Place 0.4 mg under the tongue every 5 (five) minutes as needed for chest pain.     . polyethylene glycol (MIRALAX / GLYCOLAX) 17 g packet Take 17 g by mouth daily as needed for mild constipation.    . pravastatin (PRAVACHOL) 20 MG tablet Take 20 mg by mouth daily.    Marland Kitchen spironolactone (ALDACTONE) 25 MG tablet Take 1 tablet (25 mg total) by mouth daily. 90 tablet 3  . torsemide (DEMADEX) 20 MG tablet Take 40 mg by mouth daily.    Marland Kitchen warfarin (COUMADIN) 5 MG tablet Take 1 tablet (5 mg total) by mouth See admin instructions. 2.5mg  Mon, Tues Thurs, Sat and 5mg  Wed, Fri, Sunday (Patient taking differently: Take 5 mg by mouth every evening.) 30 tablet  1   No current facility-administered medications on file prior to visit.    Allergies:  Allergies  Allergen Reactions  . Avocado Shortness Of Breath    Before 2007  . Banana Anaphylaxis and Swelling  . Plantain Anaphylaxis and Swelling  . Ace Inhibitors Cough  . Codeine Other (See Comments)    Nausea and Feels Jittery  . Prednisone Swelling    Of face and lower extremities  . Tape Hives    Tape  Does better with paper tape  Tape  Does better with paper tape    . Latex Rash   Past  Medical History:  Past Medical History:  Diagnosis Date  . Acute kidney injury ([place])   . Atrial fibrillation ([place])   . Cardiomyopathy ([place])   . CHF (congestive heart failure) ([place])   . Chronic a-fib ([place]) 08/14/2016  . Chronic anticoagulation 08/14/2016  . Coronary artery disease    4v CABG 6/17  . Hypertension   . 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 Thyroid disease    Past Surgical History:  Past Surgical History:  Procedure Laterality Date  . ABDOMINAL HYSTERECTOMY    . CORONARY ARTERY BYPASS GRAFT    . CRANIOTOMY Right 07/30/2019   Right frontal  . NEPHRECTOMY Right 10/16/2019   Social History:  Social History   Socioeconomic History  . Marital status: Widowed    Spouse name: Not on file  . Number of children: 0  . Years of education: Doctorate  . Highest education level: Master's degree (e.g., MA, MS, MEng, MEd, MSW, MBA)  Occupational History  . Occupation: Retired Clinical biochemist  Tobacco Use  . Smoking status: Former Smoker    Types: Cigarettes    Quit date: 07/30/2014    Years since quitting: 6.1  . Smokeless tobacco: Never Used  Vaping Use  . Vaping Use: Never used  Substance and Sexual Activity  . Alcohol use: Yes    Comment: 1 glass wine twice weekly  . Drug use: No  . Sexual activity: Not on file  Other Topics Concern  . Not on file  Social History Narrative   Right handed   Coffee daily/tea qod   Social Determinants of Health   Financial Resource Strain: Not on file  Food Insecurity: Not on file  Transportation Needs: Not on file  Physical Activity: Not on file  Stress: Not on file  Social Connections: Not on file  Intimate Partner Violence: Not on file   Family History:  Family History  Problem Relation Age of Onset  . Heart attack Mother   . Heart attack Father   . Heart attack Sister   . Breast cancer Neg Hx     Review of Systems: Constitutional: Doesn't report fevers, chills or abnormal weight  loss Eyes: Doesn't report blurriness of vision Ears, nose, mouth, throat, and face: Doesn't report sore throat Respiratory: Doesn't report cough, dyspnea or wheezes Cardiovascular: Doesn't report palpitation, chest discomfort  Gastrointestinal:  Doesn't report nausea, constipation, diarrhea GU: Doesn't report incontinence Skin: Doesn't report skin rashes Neurological: Per HPI Musculoskeletal: Doesn't report joint pain Behavioral/Psych: Doesn't report anxiety  Physical Exam: Vitals:   10/05/20 1009  BP: 132/82  Pulse: 70  Resp: 16  Temp: (!) 97.5 F (36.4 C)  SpO2: 100%   KPS: 80. General: Alert, cooperative, pleasant, in no acute distress Head: Normal EENT: No conjunctival injection or scleral icterus.  Lungs: Resp effort normal Cardiac: Regular rate Abdomen: Non-distended abdomen Skin:  No rashes cyanosis or petechiae. Extremities: No clubbing or edema  Neurologic Exam: Mental Status: Awake, alert, attentive to examiner. Oriented to self and environment. Language is fluent with intact comprehension.  Cranial Nerves: Visual acuity is grossly normal. Visual fields are full. Extra-ocular movements intact. No ptosis. Face is symmetric Motor: Tone and bulk are normal. Power is full in both arms and legs. Reflexes are symmetric, no pathologic reflexes present.  Sensory: Intact to light touch Gait: Orthopedic limitations  Labs: I have reviewed the data as listed    Component Value Date/Time   NA 138 07/29/2020 1054   NA 140 08/24/2016 0930   K 4.0 07/29/2020 1054   CL 104 07/29/2020 1054   CO2 26 07/29/2020 1054   GLUCOSE 77 07/29/2020 1054   BUN 27 (H) 07/29/2020 1054   BUN 25 08/24/2016 0930   CREATININE 2.13 (H) 07/29/2020 1054   CALCIUM 9.6 07/29/2020 1054   PROT 7.8 07/29/2020 1054   ALBUMIN 4.1 07/29/2020 1054   AST 16 07/29/2020 1054   ALT 11 07/29/2020 1054   ALKPHOS 96 07/29/2020 1054   BILITOT 0.6 07/29/2020 1054   GFRNONAA 24 (L) 07/29/2020 1054   GFRAA  30 (L) 01/20/2020 1433   Lab Results  Component Value Date   WBC 5.8 07/29/2020   NEUTROABS 2.7 07/29/2020   HGB 11.2 (L) 07/29/2020   HCT 34.9 (L) 07/29/2020   MCV 73.5 (L) 07/29/2020   PLT 348 07/29/2020    Imaging:  [place] Clinician Interpretation: I have personally reviewed the CNS images as listed.  My interpretation, in the context of the patient's clinical presentation, is stable disease  MR BRAIN W WO CONTRAST  Result Date: 10/01/2020 CLINICAL DATA:  Follow-up metastatic renal cell carcinoma. Resection of a solitary right frontal brain metastasis in 07/2019 followed by postoperative SRS in 09/2019 and subsequent salvage SRS in 02/2020. EXAM: MRI HEAD WITHOUT AND WITH CONTRAST TECHNIQUE: Multiplanar, multiecho pulse sequences of the brain and surrounding structures were obtained without and with intravenous contrast. CONTRAST:  71mL GADAVIST GADOBUTROL 1 MMOL/ML IV SOLN COMPARISON:  06/15/2020 FINDINGS: Brain: New lesions: None. Larger lesions: None. Stable or smaller lesions: 1. The resection cavity in the high posterior right frontal lobe has mildly collapsed in the interim with similar volume of nonsuspicious peripheral enhancement (series 11, image 131). There are associated chronic blood products. Mild surrounding T2 hyperintensity is stable to minimally increased. Overlying mild dural enhancement is unchanged without recurrence of the dural-based convexity mass inferior to the resection site on the 02/20/2020 study. Other brain findings: There is no evidence of an acute infarct, midline shift, or extra-axial fluid collection. Scattered chronic microhemorrhages are again seen in the cerebrum and cerebellum. Patchy T2 hyperintensities in the cerebral white matter bilaterally are unchanged and nonspecific but compatible with a combination of moderate chronic small vessel ischemic disease and post treatment changes. Chronic lacunar infarcts are again seen in the deep cerebral white  matter/basal ganglia regions, thalami, and cerebellum. There is mild cerebral atrophy. Vascular: Major intracranial vascular flow voids are preserved. Skull and upper cervical spine: Right frontoparietal craniotomy. No suspicious marrow lesion. Sinuses/Orbits: Unchanged appearance of the orbits with prominent orbital fat bilaterally, bilateral proptosis, and medial bulging of the right lamina papyracea in this patient with a history of thyroid disease. Paranasal sinuses and mastoid air cells are clear. Other: None. IMPRESSION: Post treatment changes in the right frontal lobe without evidence of progressive or new intracranial metastatic disease. Electronically Signed   By: Zenia Resides  Jeralyn Ruths M.D.   On: 10/01/2020 11:02    Assessment/Plan Brain metastasis ([place]) [C79.31]  Arista Kettlewell is clinically and radiographically stable today regarding supratentorial disease.    New right sided sensory symptoms, possibly neuropathic in nature based on history, could theoretically localize to nerve roots.  In setting of known leptomeningeal extension of renal cell carcinoma, it would be prudent to obtain contrast enhanced total spine study to examine for further leptomeningeal dissemination.  Recommended continuing Keppra at 500mg  BID.  We appreciate the opportunity to participate in the care of Taylormarie Register.    Next brain study can be in 3-4 months.  We will obtain mets screening spine study and call her with the results once available.   All questions were answered. The patient knows to call the clinic with any problems, questions or concerns. No barriers to learning were detected.  The total time spent in the encounter was 30 minutes and more than 50% was on counseling and review of test results   Ventura Sellers, MD Medical Director of Neuro-Oncology [facility] at [place] 10/05/20 1:55 PM

## 2020-10-12 ENCOUNTER — Other Ambulatory Visit: Payer: Self-pay

## 2020-10-12 ENCOUNTER — Ambulatory Visit (INDEPENDENT_AMBULATORY_CARE_PROVIDER_SITE_OTHER): Payer: Medicare Other | Admitting: Pharmacist Clinician (PhC)/ Clinical Pharmacy Specialist

## 2020-10-12 DIAGNOSIS — I482 Chronic atrial fibrillation, unspecified: Secondary | ICD-10-CM

## 2020-10-12 DIAGNOSIS — Z7901 Long term (current) use of anticoagulants: Secondary | ICD-10-CM | POA: Diagnosis not present

## 2020-10-12 LAB — POCT INR: INR: 3.5 — AB (ref 2.0–3.0)

## 2020-10-15 ENCOUNTER — Other Ambulatory Visit: Payer: Self-pay | Admitting: Radiation Therapy

## 2020-10-19 ENCOUNTER — Other Ambulatory Visit: Payer: Self-pay | Admitting: Radiation Therapy

## 2020-10-20 ENCOUNTER — Other Ambulatory Visit: Payer: Self-pay

## 2020-10-20 ENCOUNTER — Ambulatory Visit (HOSPITAL_COMMUNITY)
Admission: RE | Admit: 2020-10-20 | Discharge: 2020-10-20 | Disposition: A | Discharge status: Home / Self Care | Payer: Medicare Other | Source: Ambulatory Visit | Attending: Internal Medicine | Admitting: Internal Medicine

## 2020-10-20 DIAGNOSIS — G96198 Other disorders of meninges, not elsewhere classified: Secondary | ICD-10-CM | POA: Insufficient documentation

## 2020-10-20 IMAGING — MR MR TOTAL SPINE METS SCREENING
5 of 11 series · 18 of 48 positions shown · IV contrast (Yes   GAD)
Comparison: None.

CLINICAL DATA: History of renal cell carcinoma with brain
metastases. New onset pain in the right leg.

EXAM:
MRI TOTAL SPINE WITHOUT AND WITH CONTRAST
TECHNIQUE: Multisequence MR imaging of the spine from the cervical spine to the
sacrum was performed prior to and following IV contrast
administration for evaluation of spinal metastatic disease.
CONTRAST:  9mL GADAVIST GADOBUTROL 1 MMOL/ML IV SOLN

[Series 4: T1 · sagittal · 3.0mm · 0.78mm/px · 5 of 13 slices shown (1 of 2)]
[im 1/13]
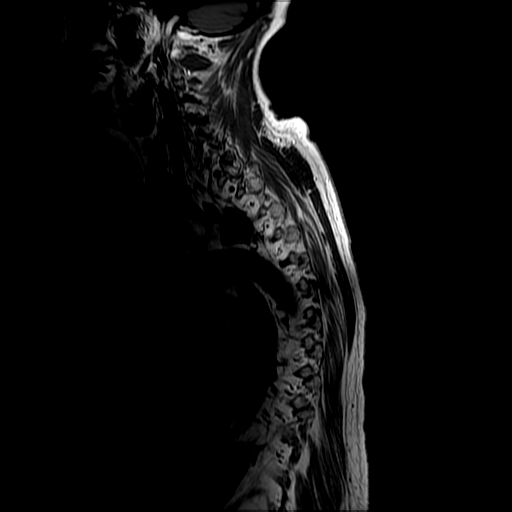
[im 4/13]
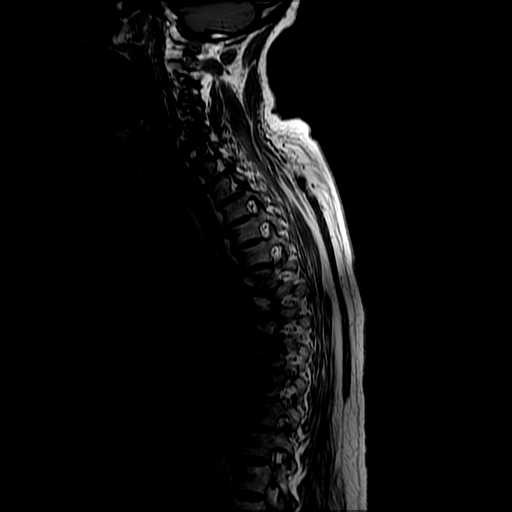
[im 7/13]
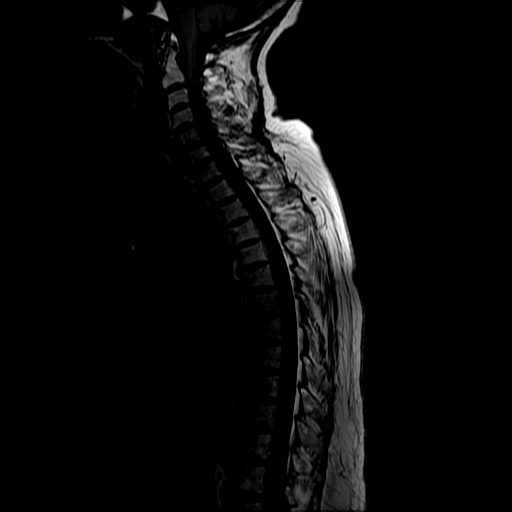
[im 10/13]
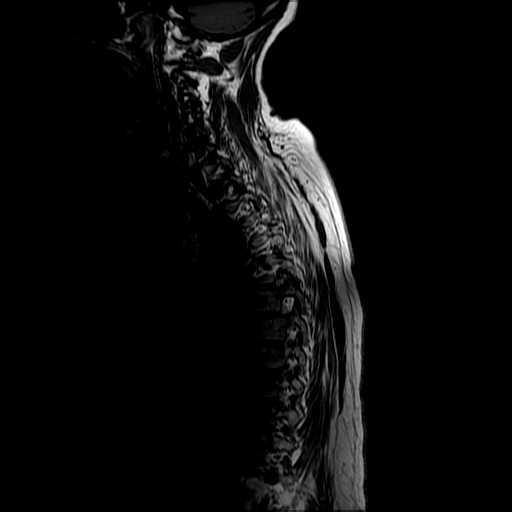
[im 13/13]
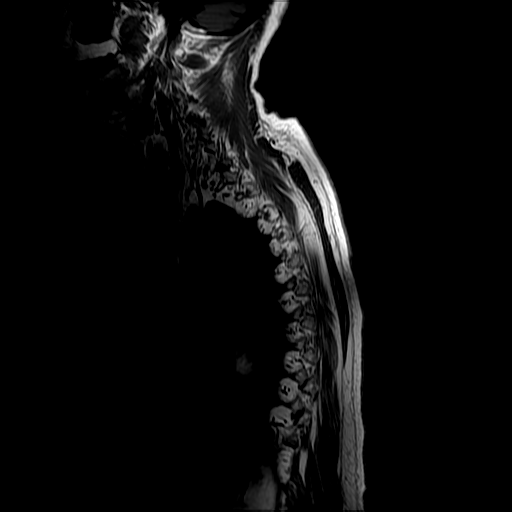

[Series 9: T1 · sagittal · 4.0mm · 0.62mm/px · 4 of 13 slices shown (2 of 2)]
[im 1/13]
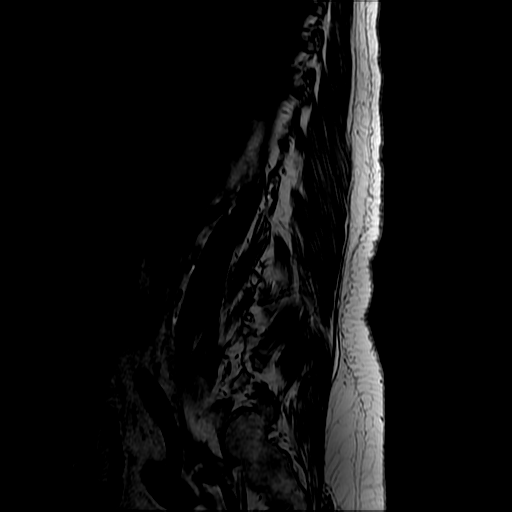
[im 5/13]
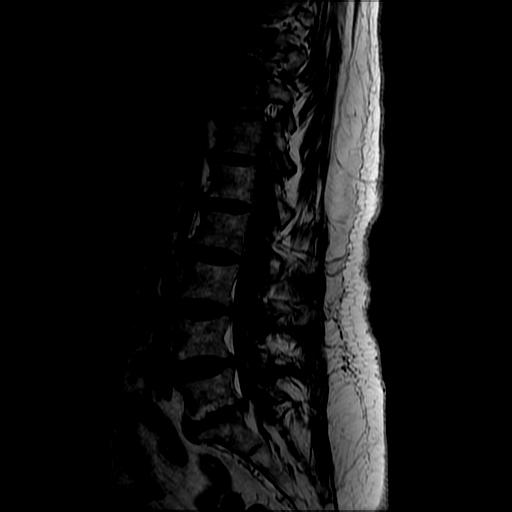
[im 9/13]
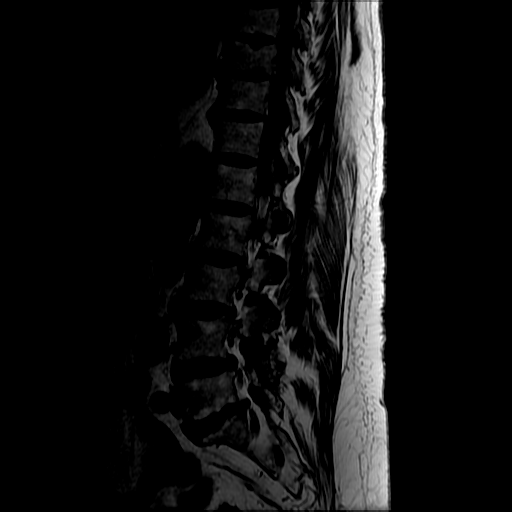
[im 13/13]
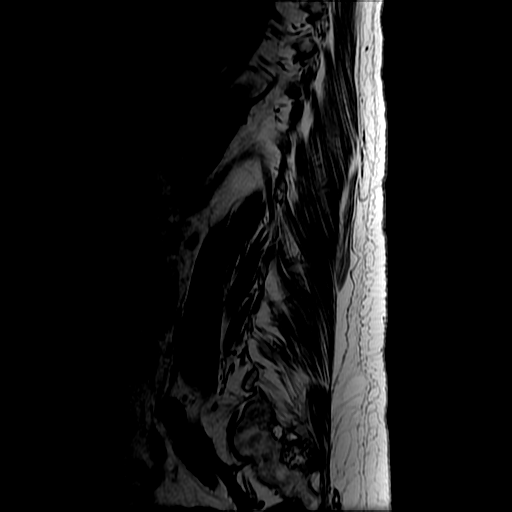

[Series 11: T2 · sagittal · 4.0mm · 0.62mm/px · 4 of 13 slices shown]
[im 1/13]
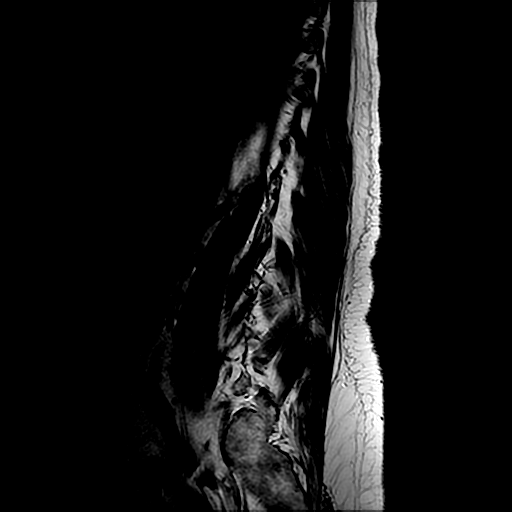
[im 5/13]
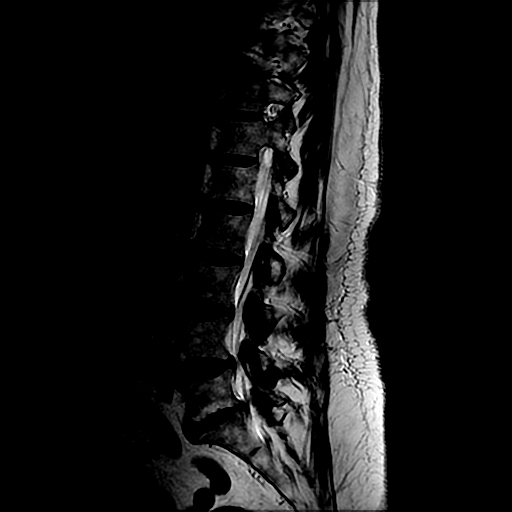
[im 9/13]
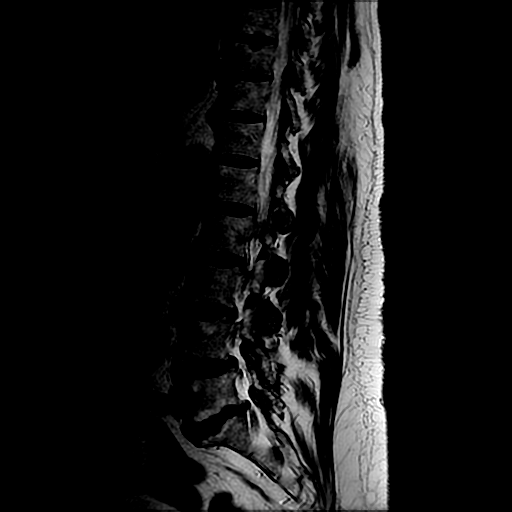
[im 13/13]
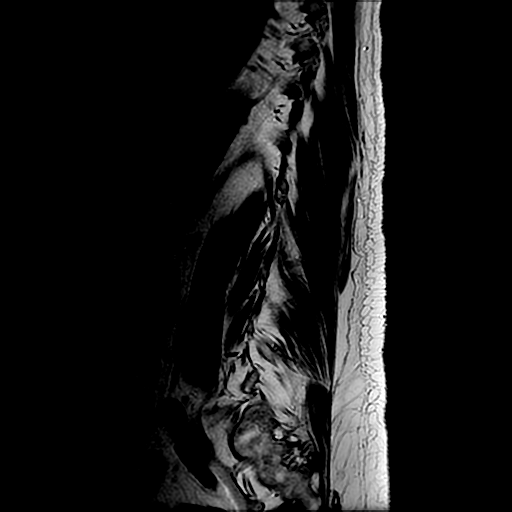

[Series 12: T1 post-contrast · sagittal · 4.0mm · 0.62mm/px · 1 of 13 slices shown]
[im 1/13]
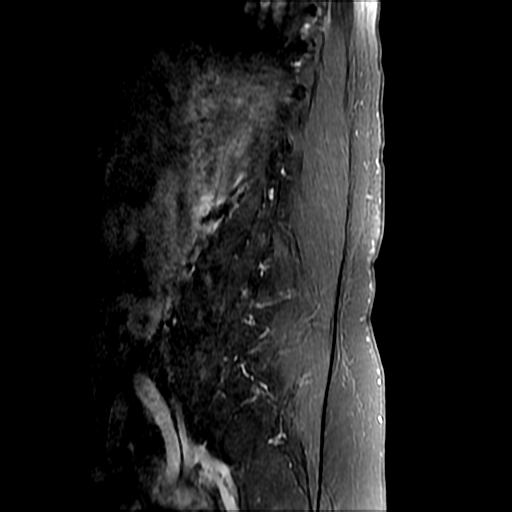

[Series 14: T2 post-contrast · sagittal · 3.0mm · 0.78mm/px · 4 of 13 slices shown]
[im 1/13]
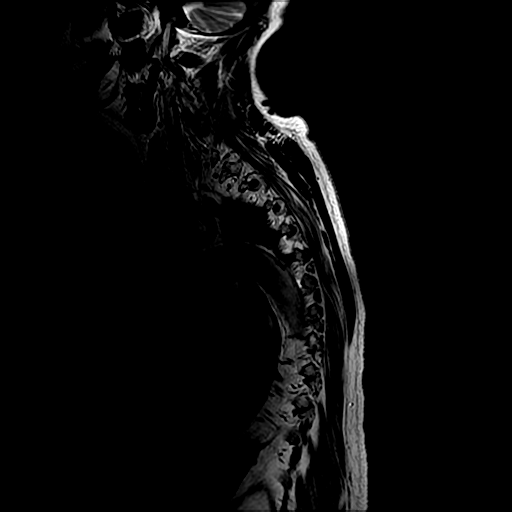
[im 5/13]
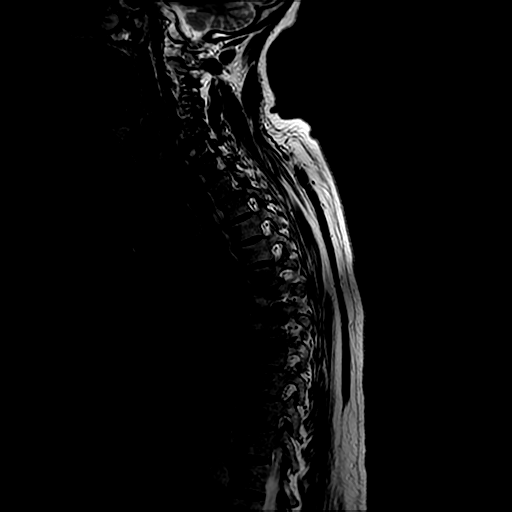
[im 9/13]
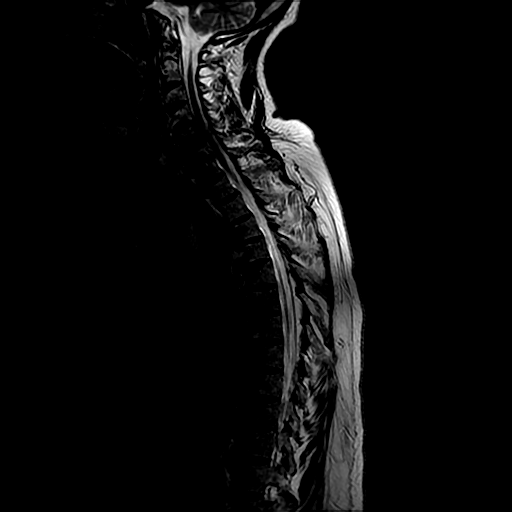
[im 13/13]
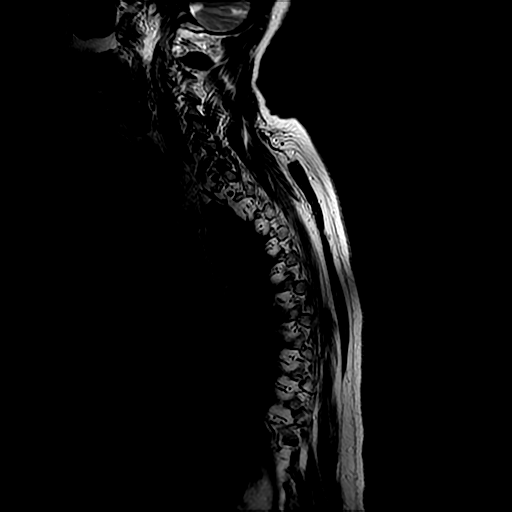

[18 of 48 positions shown; findings below may reference images not displayed]

FINDINGS: MRI CERVICAL SPINE FINDINGS

Alignment: Normal

Vertebrae: No evidence of osseous metastatic disease.

Cord: No evidence of cord metastasis or leptomeningeal disease in
the cervical region.

Posterior Fossa, vertebral arteries, paraspinal tissues: Negative

Disc levels:

Mid cervical spondylosis, most pronounced at C5-6. Narrowing of the
ventral subarachnoid space. Bilateral foraminal narrowing at this
level. Lesser, grossly non-compressive degenerative changes at the
other levels.

MRI THORACIC SPINE FINDINGS

Alignment:  Normal

Vertebrae: No evidence of fracture or osseous metastatic disease.

Cord: No cord compression or evidence of metastatic disease to the
cord or leptomeninges.

Paraspinal and other soft tissues: Negative

Disc levels:

No significant disc disease. No compressive narrowing of the canal
or foramina.

MRI LUMBAR SPINE FINDINGS

Segmentation:  5 lumbar type vertebral bodies.

Alignment:  Normal

Vertebrae:  No evidence of fracture or osseous metastatic disease.

Conus medullaris: Extends to the L1 level and appears normal.

Paraspinal and other soft tissues: Negative. Right kidney surgically
absent.

Disc levels:

Facet osteoarthritis in the lower lumbar spine. Mild bulging of the
discs at L3-4, L4-5 and L5-S1. No apparent compressive stenosis.
Facet osteoarthritis at L4-5 shows mild enhancement in could
possibly be painful.
IMPRESSION: No evidence of metastatic disease affecting the cervical, thoracic
or lumbar spine. No evidence of cord or leptomeningeal metastatic
disease. Ordinary, fairly mild degenerative changes as outlined
above, without advanced disease.

## 2020-10-20 MED ORDER — GADOBUTROL 1 MMOL/ML IV SOLN
9.0000 mL | Freq: Once | INTRAVENOUS | Status: AC | PRN
  Administered 2020-10-20: 9 mL via INTRAVENOUS

## 2020-10-20 NOTE — Progress Notes (Signed)
Per order, Changed device settings for MRI to VOO at 80 bpm   Tachy-therapies to off if applicable.   Will program device back to pre-MRI settings after completion of exam.

## 2020-10-20 NOTE — Progress Notes (Signed)
Informed of MRI for today.   Device system confirmed to be MRI conditional, with implant date > 6 weeks ago and no evidence of abandoned or epicardial leads in review of most recent CXR Interrogation from today reviewed by [employer] rep with recommendations to program to VOO 80 for the duration of the procedure Change device settings for MRI to VOO at 80 bpm  Tachy-therapies to off if applicable.  Program device back to pre-MRI settings after completion of exam.  Jacqueline Palmer  10/20/2020 10:41 AM

## 2020-10-21 ENCOUNTER — Telehealth: Payer: Self-pay | Admitting: Internal Medicine

## 2020-10-21 NOTE — Telephone Encounter (Signed)
Scheduled appt per 6/2 sch msg. Called pt, no answer. Left msg with appt date and time.  

## 2020-10-25 ENCOUNTER — Other Ambulatory Visit: Payer: Self-pay | Admitting: Internal Medicine

## 2020-10-25 NOTE — Telephone Encounter (Signed)
Requested Prescriptions  Pending Prescriptions Disp Refills  . colchicine 0.6 MG tablet [Pharmacy Med Name: Colchicine Oral Tablet 0.6 MG] 14 tablet 0    Sig: TAKE ONE TABLET BY MOUTH TWICE DAILY     Endocrinology:  Gout Agents Failed - 10/25/2020  1:50 PM      Failed - Uric Acid in normal range and within 360 days    Uric Acid, Serum  Date Value Ref Range Status  08/11/2016 13.3 (H) 2.3 - 6.6 mg/dL Final         Failed - Cr in normal range and within 360 days    Creatinine  Date Value Ref Range Status  07/29/2020 2.13 (H) 0.44 - 1.00 mg/dL Final         Passed - Valid encounter within last 12 months    Recent Outpatient Visits          2 months ago Essential hypertension   [place], MD   5 months ago Establishing care with new doctor, encounter for   [place], MD      Future Appointments            In 1 month [name], MD [place]

## 2020-10-28 ENCOUNTER — Inpatient Hospital Stay: Payer: Medicare Other | Attending: Oncology | Admitting: Internal Medicine

## 2020-10-28 DIAGNOSIS — R569 Unspecified convulsions: Secondary | ICD-10-CM | POA: Diagnosis not present

## 2020-10-28 DIAGNOSIS — C7931 Secondary malignant neoplasm of brain: Secondary | ICD-10-CM | POA: Diagnosis not present

## 2020-10-28 MED ORDER — GABAPENTIN 300 MG PO CAPS: 300.0000 mg | ORAL_CAPSULE | Freq: Two times a day (BID) | ORAL | 3 refills | Status: DC

## 2020-10-28 NOTE — Progress Notes (Signed)
I connected with Dorothea Ogle on 10/28/20 at  9:30 AM EDT by telephone visit and verified that I am speaking with the correct person using two identifiers.  I discussed the limitations, risks, security and privacy concerns of performing an evaluation and management service by telemedicine and the availability of in-person appointments. I also discussed with the patient that there may be a patient responsible charge related to this service. The patient expressed understanding and agreed to proceed.  Other persons participating in the visit and their role in the encounter:  n/a  Patient's location:  Home  Provider's location:  Office  Chief Complaint:  Brain metastasis ([place])  Seizures ([place])  History of Present Ilness: Jacqueline Palmer describes no new or progressive deficits today.  She continues to experience pain running down right arm and leg in same pattern described previously.  This has unfortunately not improved at all and is causing considerable discomfort.  Observations: Language and cognition at baseline   Imaging:  [place] Clinician Interpretation: I have personally reviewed the CNS images as listed.  My interpretation, in the context of the patient's clinical presentation, is stable disease  MR BRAIN W WO CONTRAST  Result Date: 10/01/2020 CLINICAL DATA:  Follow-up metastatic renal cell carcinoma. Resection of a solitary right frontal brain metastasis in 07/2019 followed by postoperative SRS in 09/2019 and subsequent salvage SRS in 02/2020. EXAM: MRI HEAD WITHOUT AND WITH CONTRAST TECHNIQUE: Multiplanar, multiecho pulse sequences of the brain and surrounding structures were obtained without and with intravenous contrast. CONTRAST:  69mL GADAVIST GADOBUTROL 1 MMOL/ML IV SOLN COMPARISON:  06/15/2020 FINDINGS: Brain: New lesions: None. Larger lesions: None. Stable or smaller lesions: 1. The resection cavity in the high posterior right frontal lobe has mildly collapsed in the interim with similar  volume of nonsuspicious peripheral enhancement (series 11, image 131). There are associated chronic blood products. Mild surrounding T2 hyperintensity is stable to minimally increased. Overlying mild dural enhancement is unchanged without recurrence of the dural-based convexity mass inferior to the resection site on the 02/20/2020 study. Other brain findings: There is no evidence of an acute infarct, midline shift, or extra-axial fluid collection. Scattered chronic microhemorrhages are again seen in the cerebrum and cerebellum. Patchy T2 hyperintensities in the cerebral white matter bilaterally are unchanged and nonspecific but compatible with a combination of moderate chronic small vessel ischemic disease and post treatment changes. Chronic lacunar infarcts are again seen in the deep cerebral white matter/basal ganglia regions, thalami, and cerebellum. There is mild cerebral atrophy. Vascular: Major intracranial vascular flow voids are preserved. Skull and upper cervical spine: Right frontoparietal craniotomy. No suspicious marrow lesion. Sinuses/Orbits: Unchanged appearance of the orbits with prominent orbital fat bilaterally, bilateral proptosis, and medial bulging of the right lamina papyracea in this patient with a history of thyroid disease. Paranasal sinuses and mastoid air cells are clear. Other: None. IMPRESSION: Post treatment changes in the right frontal lobe without evidence of progressive or new intracranial metastatic disease. Electronically Signed   By: Logan Bores M.D.   On: 10/01/2020 11:02   MR TOTAL SPINE METS SCREENING  Result Date: 10/20/2020 CLINICAL DATA:  History of renal cell carcinoma with brain metastases. New onset pain in the right leg. EXAM: MRI TOTAL SPINE WITHOUT AND WITH CONTRAST TECHNIQUE: Multisequence MR imaging of the spine from the cervical spine to the sacrum was performed prior to and following IV contrast administration for evaluation of spinal metastatic disease.  CONTRAST:  63mL GADAVIST GADOBUTROL 1 MMOL/ML IV SOLN COMPARISON:  None.  FINDINGS: MRI CERVICAL SPINE FINDINGS Alignment: Normal Vertebrae: No evidence of osseous metastatic disease. Cord: No evidence of cord metastasis or leptomeningeal disease in the cervical region. Posterior Fossa, vertebral arteries, paraspinal tissues: Negative Disc levels: Mid cervical spondylosis, most pronounced at C5-6. Narrowing of the ventral subarachnoid space. Bilateral foraminal narrowing at this level. Lesser, grossly non-compressive degenerative changes at the other levels. MRI THORACIC SPINE FINDINGS Alignment:  Normal Vertebrae: No evidence of fracture or osseous metastatic disease. Cord: No cord compression or evidence of metastatic disease to the cord or leptomeninges. Paraspinal and other soft tissues: Negative Disc levels: No significant disc disease. No compressive narrowing of the canal or foramina. MRI LUMBAR SPINE FINDINGS Segmentation:  5 lumbar type vertebral bodies. Alignment:  Normal Vertebrae:  No evidence of fracture or osseous metastatic disease. Conus medullaris: Extends to the L1 level and appears normal. Paraspinal and other soft tissues: Negative. Right kidney surgically absent. 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 Mild bulging of the discs at L3-4, L4-5 and L5-S1. No apparent compressive stenosis. Facet osteoarthritis at L4-5 shows mild enhancement in could possibly be painful. IMPRESSION: No evidence of metastatic disease affecting the cervical, thoracic or lumbar spine. No evidence of cord or leptomeningeal metastatic disease. Ordinary, fairly mild degenerative changes as outlined above, without advanced disease. Electronically Signed   By: Nelson Chimes M.D.   On: 10/20/2020 15:35    Assessment and Plan: Brain metastasis ([place])  No radiographic evidence of leptomeningeal dissemination extending to structures in the spine or nerve roots.    For neuropathic pain, recommended trial of  gabapentin 300mg  BID.    Follow Up Instructions:  Will call her back in 1 month to assess response to therapy.  Next MRI of brain scheduled for 01/28/21.  I discussed the assessment and treatment plan with the patient.  The patient was provided an opportunity to ask questions and all were answered.  The patient agreed with the plan and demonstrated understanding of the instructions.    The patient was advised to call back or seek an in-person evaluation if the symptoms worsen or if the condition fails to improve as anticipated.  I provided 5-10 minutes of non-face-to-face time during this enocunter.  Ventura Sellers, MD   I provided 15 minutes of non face-to-face telephone visit time during this encounter, and > 50% was spent counseling as documented under my assessment & plan.

## 2020-11-02 ENCOUNTER — Ambulatory Visit (HOSPITAL_COMMUNITY): Payer: Medicare Other

## 2020-11-04 ENCOUNTER — Ambulatory Visit (INDEPENDENT_AMBULATORY_CARE_PROVIDER_SITE_OTHER): Payer: Medicare Other | Admitting: Pharmacist Clinician (PhC)/ Clinical Pharmacy Specialist

## 2020-11-04 ENCOUNTER — Other Ambulatory Visit: Payer: Self-pay

## 2020-11-04 DIAGNOSIS — Z7901 Long term (current) use of anticoagulants: Secondary | ICD-10-CM

## 2020-11-04 DIAGNOSIS — I482 Chronic atrial fibrillation, unspecified: Secondary | ICD-10-CM | POA: Diagnosis not present

## 2020-11-04 LAB — POCT INR: INR: 3.8 — AB (ref 2.0–3.0)

## 2020-11-07 ENCOUNTER — Other Ambulatory Visit: Payer: Self-pay | Admitting: Internal Medicine

## 2020-11-07 NOTE — Telephone Encounter (Signed)
Requested medication (s) are due for refill today: yes  Requested medication (s) are on the active medication list: yes  Last refill:  10/25/20  Future visit scheduled: yes  Notes to clinic:  overdue lab work   Requested Prescriptions  Pending Prescriptions Disp Refills   colchicine 0.6 MG tablet [Pharmacy Med Name: Colchicine Oral Tablet 0.6 MG] 14 tablet 0    Sig: TAKE ONE TABLET BY MOUTH TWICE DAILY      Endocrinology:  Gout Agents Failed - 11/07/2020 12:50 AM      Failed - Uric Acid in normal range and within 360 days    Uric Acid, Serum  Date Value Ref Range Status  08/11/2016 13.3 (H) 2.3 - 6.6 mg/dL Final          Failed - Cr in normal range and within 360 days    Creatinine  Date Value Ref Range Status  07/29/2020 2.13 (H) 0.44 - 1.00 mg/dL Final          Passed - Valid encounter within last 12 months    Recent Outpatient Visits           2 months ago Essential hypertension   [place], MD   6 months ago Establishing care with new doctor, encounter for   [place], MD       Future Appointments             In 1 month [name], MD [place]

## 2020-11-10 ENCOUNTER — Other Ambulatory Visit: Payer: Self-pay | Admitting: Internal Medicine

## 2020-11-10 NOTE — Telephone Encounter (Signed)
  Notes to clinic: Review for refill Sent yesterday for only 14 tabs   Requested Prescriptions  Pending Prescriptions Disp Refills   colchicine 0.6 MG tablet [Pharmacy Med Name: Colchicine Oral Tablet 0.6 MG] 14 tablet 0    Sig: TAKE ONE TABLET BY MOUTH TWICE DAILY      Endocrinology:  Gout Agents Failed - 11/10/2020  1:15 PM      Failed - Uric Acid in normal range and within 360 days    Uric Acid, Serum  Date Value Ref Range Status  08/11/2016 13.3 (H) 2.3 - 6.6 mg/dL Final          Failed - Cr in normal range and within 360 days    Creatinine  Date Value Ref Range Status  07/29/2020 2.13 (H) 0.44 - 1.00 mg/dL Final          Passed - Valid encounter within last 12 months    Recent Outpatient Visits           2 months ago Essential hypertension   [place], MD   6 months ago Establishing care with new doctor, encounter for   Maria [place], MD       Future Appointments             In 1 month [name], MD [place]

## 2020-11-11 NOTE — Telephone Encounter (Signed)
Copied from [place] [id number]. Topic: General - Other >> Nov 10, 2020  4:53 PM Pawlus, Brayton Layman A wrote: Reason for CRM: Pt stated the pharmacist at [facility] informed her that the prescription for colchicine 0.6 MG tablet was denied, pt stated she needs this medication, please advise.

## 2020-11-11 NOTE — Telephone Encounter (Signed)
Requested medication (s) are due for refill today: no  Requested medication (s) are on the active medication list: yes    Future visit scheduled: yes   Notes to clinic:   review for refill Short supply given   Requested Prescriptions  Pending Prescriptions Disp Refills   colchicine 0.6 MG tablet [Pharmacy Med Name: Colchicine Oral Tablet 0.6 MG] 14 tablet 0    Sig: TAKE ONE TABLET BY MOUTH TWICE DAILY      Endocrinology:  Gout Agents Failed - 11/10/2020  4:55 PM      Failed - Uric Acid in normal range and within 360 days    Uric Acid, Serum  Date Value Ref Range Status  08/11/2016 13.3 (H) 2.3 - 6.6 mg/dL Final          Failed - Cr in normal range and within 360 days    Creatinine  Date Value Ref Range Status  07/29/2020 2.13 (H) 0.44 - 1.00 mg/dL Final          Passed - Valid encounter within last 12 months    Recent Outpatient Visits           2 months ago Essential hypertension   [place], MD   6 months ago Establishing care with new doctor, encounter for   [place], MD       Future Appointments             In 1 month [name], MD [place]

## 2020-11-15 ENCOUNTER — Other Ambulatory Visit: Payer: Self-pay | Admitting: Internal Medicine

## 2020-11-15 NOTE — Telephone Encounter (Signed)
Rx was sent on 6/24

## 2020-11-15 NOTE — Telephone Encounter (Signed)
Requested medication (s) are due for refill today: Yes  Requested medication (s) are on the active medication list: Yes  Last refill:  01/15/20  Future visit scheduled: Yes  Notes to clinic:  Unable to refill per protocol, last refill by another provider.      Requested Prescriptions  Pending Prescriptions Disp Refills   metoprolol succinate (TOPROL-XL) 25 MG 24 hr tablet [Pharmacy Med Name: Metoprolol Succinate ER Oral Tablet Extended Release 24 Hour 25 MG] 30 tablet 0    Sig: TAKE ONE TABLET BY MOUTH ONCE DAILY      Cardiovascular:  Beta Blockers Passed - 11/15/2020  2:49 PM      Passed - Last BP in normal range    BP Readings from Last 1 Encounters:  10/05/20 132/82          Passed - Last Heart Rate in normal range    Pulse Readings from Last 1 Encounters:  10/05/20 70          Passed - Valid encounter within last 6 months    Recent Outpatient Visits           3 months ago Essential hypertension   [place], MD   6 months ago Establishing care with new doctor, encounter for   [place], MD       Future Appointments             In 4 weeks Ladell Pier, MD [place]

## 2020-11-15 NOTE — Telephone Encounter (Signed)
Notes to clinic:  medication filled by a historical provider  Review for continued use and refill    Requested Prescriptions  Pending Prescriptions Disp Refills   torsemide (DEMADEX) 20 MG tablet [Pharmacy Med Name: Torsemide Oral Tablet 20 MG] 130 tablet 0    Sig: TAKE TWO TABLETS BY MOUTH IN THE MORNING AND ONE IN THE EVENING      Cardiovascular:  Diuretics - Loop Failed - 11/15/2020  2:47 PM      Failed - Cr in normal range and within 360 days    Creatinine  Date Value Ref Range Status  07/29/2020 2.13 (H) 0.44 - 1.00 mg/dL Final          Passed - K in normal range and within 360 days    Potassium  Date Value Ref Range Status  07/29/2020 4.0 3.5 - 5.1 mmol/L Final          Passed - Ca in normal range and within 360 days    Calcium  Date Value Ref Range Status  07/29/2020 9.6 8.9 - 10.3 mg/dL Final          Passed - Na in normal range and within 360 days    Sodium  Date Value Ref Range Status  07/29/2020 138 135 - 145 mmol/L Final  08/24/2016 140 134 - 144 mmol/L Final          Passed - Last BP in normal range    BP Readings from Last 1 Encounters:  10/05/20 132/82          Passed - Valid encounter within last 6 months    Recent Outpatient Visits           3 months ago Essential hypertension   [place], Deborah B, MD   6 months ago Establishing care with new doctor, encounter for   [place], MD       Future Appointments             In 4 weeks Ladell Pier, MD [place]               levothyroxine (SYNTHROID) 25 MCG tablet [Pharmacy Med Name: Levothyroxine Sodium Oral Tablet 25 MCG] 90 tablet 0    Sig: TAKE ONE TABLET BY MOUTH  ONCE A DAY ON AN EMPTY STOMACH WITH A FULL GLASS OF WATER 30-60 MINUTES BEFORE BREAKFAST      Endocrinology:  Hypothyroid Agents Failed - 11/15/2020  2:47 PM      Failed - TSH needs to be  rechecked within 3 months after an abnormal result. Refill until TSH is due.      Passed - TSH in normal range and within 360 days    TSH  Date Value Ref Range Status  05/03/2020 2.940 0.450 - 4.500 uIU/mL Final          Passed - Valid encounter within last 12 months    Recent Outpatient Visits           3 months ago Essential hypertension   [place], MD   6 months ago Establishing care with new doctor, encounter for   [place], MD       Future Appointments             In 4 weeks Ladell Pier, MD [place]  [place]

## 2020-11-20 ENCOUNTER — Other Ambulatory Visit: Payer: Self-pay | Admitting: Internal Medicine

## 2020-11-21 NOTE — Telephone Encounter (Signed)
Requested medication (s) are due for refill today: yes  Requested medication (s) are on the active medication list: yes  Last refill:  11/12/20 #14  Future visit scheduled: yes  Notes to clinic:  overdue uric acid   Requested Prescriptions  Pending Prescriptions Disp Refills   colchicine 0.6 MG tablet [Pharmacy Med Name: Colchicine Oral Tablet 0.6 MG] 14 tablet 0    Sig: TAKE ONE TABLET BY MOUTH TWICE DAILY      Endocrinology:  Gout Agents Failed - 11/20/2020 11:38 PM      Failed - Uric Acid in normal range and within 360 days    Uric Acid, Serum  Date Value Ref Range Status  08/11/2016 13.3 (H) 2.3 - 6.6 mg/dL Final          Failed - Cr in normal range and within 360 days    Creatinine  Date Value Ref Range Status  07/29/2020 2.13 (H) 0.44 - 1.00 mg/dL Final          Passed - Valid encounter within last 12 months    Recent Outpatient Visits           3 months ago Essential hypertension   [place], MD   6 months ago Establishing care with new doctor, encounter for   [place], MD       Future Appointments             In 3 weeks Ladell Pier, MD [place]

## 2020-11-23 ENCOUNTER — Other Ambulatory Visit: Payer: Self-pay

## 2020-11-23 ENCOUNTER — Ambulatory Visit (INDEPENDENT_AMBULATORY_CARE_PROVIDER_SITE_OTHER): Payer: Medicare Other | Admitting: Pharmacist Clinician (PhC)/ Clinical Pharmacy Specialist

## 2020-11-23 DIAGNOSIS — Z7901 Long term (current) use of anticoagulants: Secondary | ICD-10-CM

## 2020-11-23 DIAGNOSIS — I482 Chronic atrial fibrillation, unspecified: Secondary | ICD-10-CM

## 2020-11-23 LAB — POCT INR: INR: 2.3 (ref 2.0–3.0)

## 2020-11-25 ENCOUNTER — Other Ambulatory Visit: Payer: Self-pay

## 2020-11-25 ENCOUNTER — Inpatient Hospital Stay: Payer: Medicare Other | Attending: Oncology | Admitting: Internal Medicine

## 2020-11-25 DIAGNOSIS — M792 Neuralgia and neuritis, unspecified: Secondary | ICD-10-CM | POA: Diagnosis not present

## 2020-11-25 NOTE — Progress Notes (Signed)
I connected with Dorothea Ogle on 11/25/20 at 12:00 PM EDT by telephone visit and verified that I am speaking with the correct person using two identifiers.  I discussed the limitations, risks, security and privacy concerns of performing an evaluation and management service by telemedicine and the availability of in-person appointments. I also discussed with the patient that there may be a patient responsible charge related to this service. The patient expressed understanding and agreed to proceed.  Other persons participating in the visit and their role in the encounter:  n/a  Patient's location:  Home  Provider's location:  Office  Chief Complaint:  Neuropathic pain  History of Present Ilness: [place] Mosco describes clear improvement in neuropathic pain symptoms since starting the gabapentin.  Symptoms are still present, but less severe and not at all times as before.  She does acknowledge some drowsiness as an effect of the medication.  No other changes Observations: Language and cognition at baseline  Assessment and Plan: Neuropathic pain Discussed increasing dose, patient prefers to stay at same dose level due to tolerance issues, good overall efficacy.    Follow Up Instructions: RTC in September after brain MRI as scheduled  I discussed the assessment and treatment plan with the patient.  The patient was provided an opportunity to ask questions and all were answered.  The patient agreed with the plan and demonstrated understanding of the instructions.    The patient was advised to call back or seek an in-person evaluation if the symptoms worsen or if the condition fails to improve as anticipated.  I provided 5-10 minutes of non-face-to-face time during this enocunter.  Ventura Sellers, MD   I provided 15 minutes of non face-to-face telephone visit time during this encounter, and > 50% was spent counseling as documented under my assessment & plan.

## 2020-11-28 ENCOUNTER — Other Ambulatory Visit: Payer: Self-pay | Admitting: Internal Medicine

## 2020-11-28 NOTE — Telephone Encounter (Signed)
Request by interface surescripts, receipt confirmed by pharmacy 11/23/20 at 4:37 pm

## 2020-12-02 ENCOUNTER — Telehealth: Payer: Self-pay | Admitting: Cardiology

## 2020-12-02 ENCOUNTER — Other Ambulatory Visit: Payer: Self-pay

## 2020-12-02 ENCOUNTER — Emergency Department (HOSPITAL_COMMUNITY): Payer: Medicare Other

## 2020-12-02 ENCOUNTER — Observation Stay (HOSPITAL_COMMUNITY)
Admission: EM | Admit: 2020-12-02 | Discharge: 2020-12-03 | Disposition: A | Discharge status: Home / Self Care | Payer: Medicare Other | Attending: Family Medicine | Admitting: Family Medicine

## 2020-12-02 ENCOUNTER — Ambulatory Visit (INDEPENDENT_AMBULATORY_CARE_PROVIDER_SITE_OTHER): Payer: Medicare Other

## 2020-12-02 DIAGNOSIS — N184 Chronic kidney disease, stage 4 (severe): Secondary | ICD-10-CM | POA: Diagnosis present

## 2020-12-02 DIAGNOSIS — Z79899 Other long term (current) drug therapy: Secondary | ICD-10-CM | POA: Diagnosis not present

## 2020-12-02 DIAGNOSIS — Z87891 Personal history of nicotine dependence: Secondary | ICD-10-CM | POA: Insufficient documentation

## 2020-12-02 DIAGNOSIS — I5032 Chronic diastolic (congestive) heart failure: Secondary | ICD-10-CM

## 2020-12-02 DIAGNOSIS — I130 Hypertensive heart and chronic kidney disease with heart failure and stage 1 through stage 4 chronic kidney disease, or unspecified chronic kidney disease: Secondary | ICD-10-CM | POA: Diagnosis not present

## 2020-12-02 DIAGNOSIS — R569 Unspecified convulsions: Secondary | ICD-10-CM

## 2020-12-02 DIAGNOSIS — N179 Acute kidney failure, unspecified: Principal | ICD-10-CM | POA: Diagnosis present

## 2020-12-02 DIAGNOSIS — Z951 Presence of aortocoronary bypass graft: Secondary | ICD-10-CM

## 2020-12-02 DIAGNOSIS — R5383 Other fatigue: Secondary | ICD-10-CM | POA: Diagnosis present

## 2020-12-02 DIAGNOSIS — R079 Chest pain, unspecified: Secondary | ICD-10-CM | POA: Diagnosis present

## 2020-12-02 DIAGNOSIS — I5043 Acute on chronic combined systolic (congestive) and diastolic (congestive) heart failure: Secondary | ICD-10-CM | POA: Diagnosis not present

## 2020-12-02 DIAGNOSIS — R0789 Other chest pain: Secondary | ICD-10-CM | POA: Insufficient documentation

## 2020-12-02 DIAGNOSIS — Z91040 Latex allergy status: Secondary | ICD-10-CM | POA: Diagnosis not present

## 2020-12-02 DIAGNOSIS — Z952 Presence of prosthetic heart valve: Secondary | ICD-10-CM

## 2020-12-02 DIAGNOSIS — Z85841 Personal history of malignant neoplasm of brain: Secondary | ICD-10-CM | POA: Insufficient documentation

## 2020-12-02 DIAGNOSIS — Z85528 Personal history of other malignant neoplasm of kidney: Secondary | ICD-10-CM | POA: Insufficient documentation

## 2020-12-02 DIAGNOSIS — Z7901 Long term (current) use of anticoagulants: Secondary | ICD-10-CM | POA: Diagnosis not present

## 2020-12-02 DIAGNOSIS — C7931 Secondary malignant neoplasm of brain: Secondary | ICD-10-CM | POA: Diagnosis present

## 2020-12-02 DIAGNOSIS — R791 Abnormal coagulation profile: Secondary | ICD-10-CM | POA: Diagnosis present

## 2020-12-02 DIAGNOSIS — I10 Essential (primary) hypertension: Secondary | ICD-10-CM | POA: Diagnosis present

## 2020-12-02 DIAGNOSIS — I482 Chronic atrial fibrillation, unspecified: Secondary | ICD-10-CM | POA: Diagnosis not present

## 2020-12-02 DIAGNOSIS — C641 Malignant neoplasm of right kidney, except renal pelvis: Secondary | ICD-10-CM | POA: Diagnosis present

## 2020-12-02 DIAGNOSIS — I4821 Permanent atrial fibrillation: Secondary | ICD-10-CM | POA: Diagnosis not present

## 2020-12-02 DIAGNOSIS — Z20822 Contact with and (suspected) exposure to covid-19: Secondary | ICD-10-CM | POA: Insufficient documentation

## 2020-12-02 DIAGNOSIS — I428 Other cardiomyopathies: Secondary | ICD-10-CM

## 2020-12-02 LAB — CBC WITH DIFFERENTIAL/PLATELET
Abs Immature Granulocytes: 0.02 10*3/uL (ref 0.00–0.07)
Basophils Absolute: 0 10*3/uL (ref 0.0–0.1)
Basophils Relative: 1 %
Eosinophils Absolute: 0.2 10*3/uL (ref 0.0–0.5)
Eosinophils Relative: 2 %
HCT: 38.7 % (ref 36.0–46.0)
Hemoglobin: 12.2 g/dL (ref 12.0–15.0)
Immature Granulocytes: 0 %
Lymphocytes Relative: 37 %
Lymphs Abs: 2.7 10*3/uL (ref 0.7–4.0)
MCH: 23.7 pg — ABNORMAL LOW (ref 26.0–34.0)
MCHC: 31.5 g/dL (ref 30.0–36.0)
MCV: 75.1 fL — ABNORMAL LOW (ref 80.0–100.0)
Monocytes Absolute: 0.8 10*3/uL (ref 0.1–1.0)
Monocytes Relative: 11 %
Neutro Abs: 3.6 10*3/uL (ref 1.7–7.7)
Neutrophils Relative %: 49 %
Platelets: 305 10*3/uL (ref 150–400)
RBC: 5.15 MIL/uL — ABNORMAL HIGH (ref 3.87–5.11)
RDW: 14.9 % (ref 11.5–15.5)
WBC: 7.3 10*3/uL (ref 4.0–10.5)
nRBC: 0 % (ref 0.0–0.2)

## 2020-12-02 LAB — CUP PACEART REMOTE DEVICE CHECK
Battery Remaining Longevity: 54 mo
Battery Remaining Percentage: 65 %
Brady Statistic RA Percent Paced: 0 %
Brady Statistic RV Percent Paced: 100 %
Date Time Interrogation Session: 20220714000100
HighPow Impedance: 73 Ohm
Implantable Lead Implant Date: 20180423
Implantable Lead Implant Date: 20180423
Implantable Lead Implant Date: 20180423
Implantable Lead Location: 753858
Implantable Lead Location: 753859
Implantable Lead Location: 753860
Implantable Lead Model: 292
Implantable Lead Model: 4672
Implantable Lead Model: 7740
Implantable Lead Serial Number: 431793
Implantable Lead Serial Number: 602825
Implantable Lead Serial Number: 703305
Implantable Pulse Generator Implant Date: 20180423
Lead Channel Impedance Value: 1122 Ohm
Lead Channel Impedance Value: 474 Ohm
Lead Channel Impedance Value: 516 Ohm
Lead Channel Pacing Threshold Amplitude: 0.5 V
Lead Channel Pacing Threshold Amplitude: 0.6 V
Lead Channel Pacing Threshold Pulse Width: 0.4 ms
Lead Channel Pacing Threshold Pulse Width: 0.4 ms
Lead Channel Setting Pacing Amplitude: 1.6 V
Lead Channel Setting Pacing Amplitude: 2 V
Lead Channel Setting Pacing Pulse Width: 0.4 ms
Lead Channel Setting Pacing Pulse Width: 0.4 ms
Lead Channel Setting Sensing Sensitivity: 0.6 mV
Lead Channel Setting Sensing Sensitivity: 1 mV
Pulse Gen Serial Number: 174046

## 2020-12-02 LAB — PROTIME-INR
INR: 2.1 — ABNORMAL HIGH (ref 0.8–1.2)
Prothrombin Time: 23.7 seconds — ABNORMAL HIGH (ref 11.4–15.2)

## 2020-12-02 LAB — URINALYSIS, ROUTINE W REFLEX MICROSCOPIC
Bilirubin Urine: NEGATIVE
Glucose, UA: NEGATIVE mg/dL
Hgb urine dipstick: NEGATIVE
Ketones, ur: NEGATIVE mg/dL
Leukocytes,Ua: NEGATIVE
Nitrite: NEGATIVE
Protein, ur: NEGATIVE mg/dL
Specific Gravity, Urine: 1.008 (ref 1.005–1.030)
pH: 6 (ref 5.0–8.0)

## 2020-12-02 LAB — BASIC METABOLIC PANEL
Anion gap: 12 (ref 5–15)
BUN: 60 mg/dL — ABNORMAL HIGH (ref 8–23)
CO2: 24 mmol/L (ref 22–32)
Calcium: 9.7 mg/dL (ref 8.9–10.3)
Chloride: 95 mmol/L — ABNORMAL LOW (ref 98–111)
Creatinine, Ser: 2.85 mg/dL — ABNORMAL HIGH (ref 0.44–1.00)
GFR, Estimated: 17 mL/min — ABNORMAL LOW (ref >60)
Glucose, Bld: 83 mg/dL (ref 70–99)
Potassium: 4.2 mmol/L (ref 3.5–5.1)
Sodium: 131 mmol/L — ABNORMAL LOW (ref 135–145)

## 2020-12-02 LAB — TROPONIN I (HIGH SENSITIVITY)
Troponin I (High Sensitivity): 10 ng/L (ref <18)
Troponin I (High Sensitivity): 10 ng/L (ref <18)

## 2020-12-02 IMAGING — CR DG CHEST 2V
2 series · 2 of 2 positions shown · non-contrast
Comparison: [DATE] and CT [DATE]

CLINICAL DATA: Chest pain

EXAM:
CHEST - 2 VIEW

[chest ap]
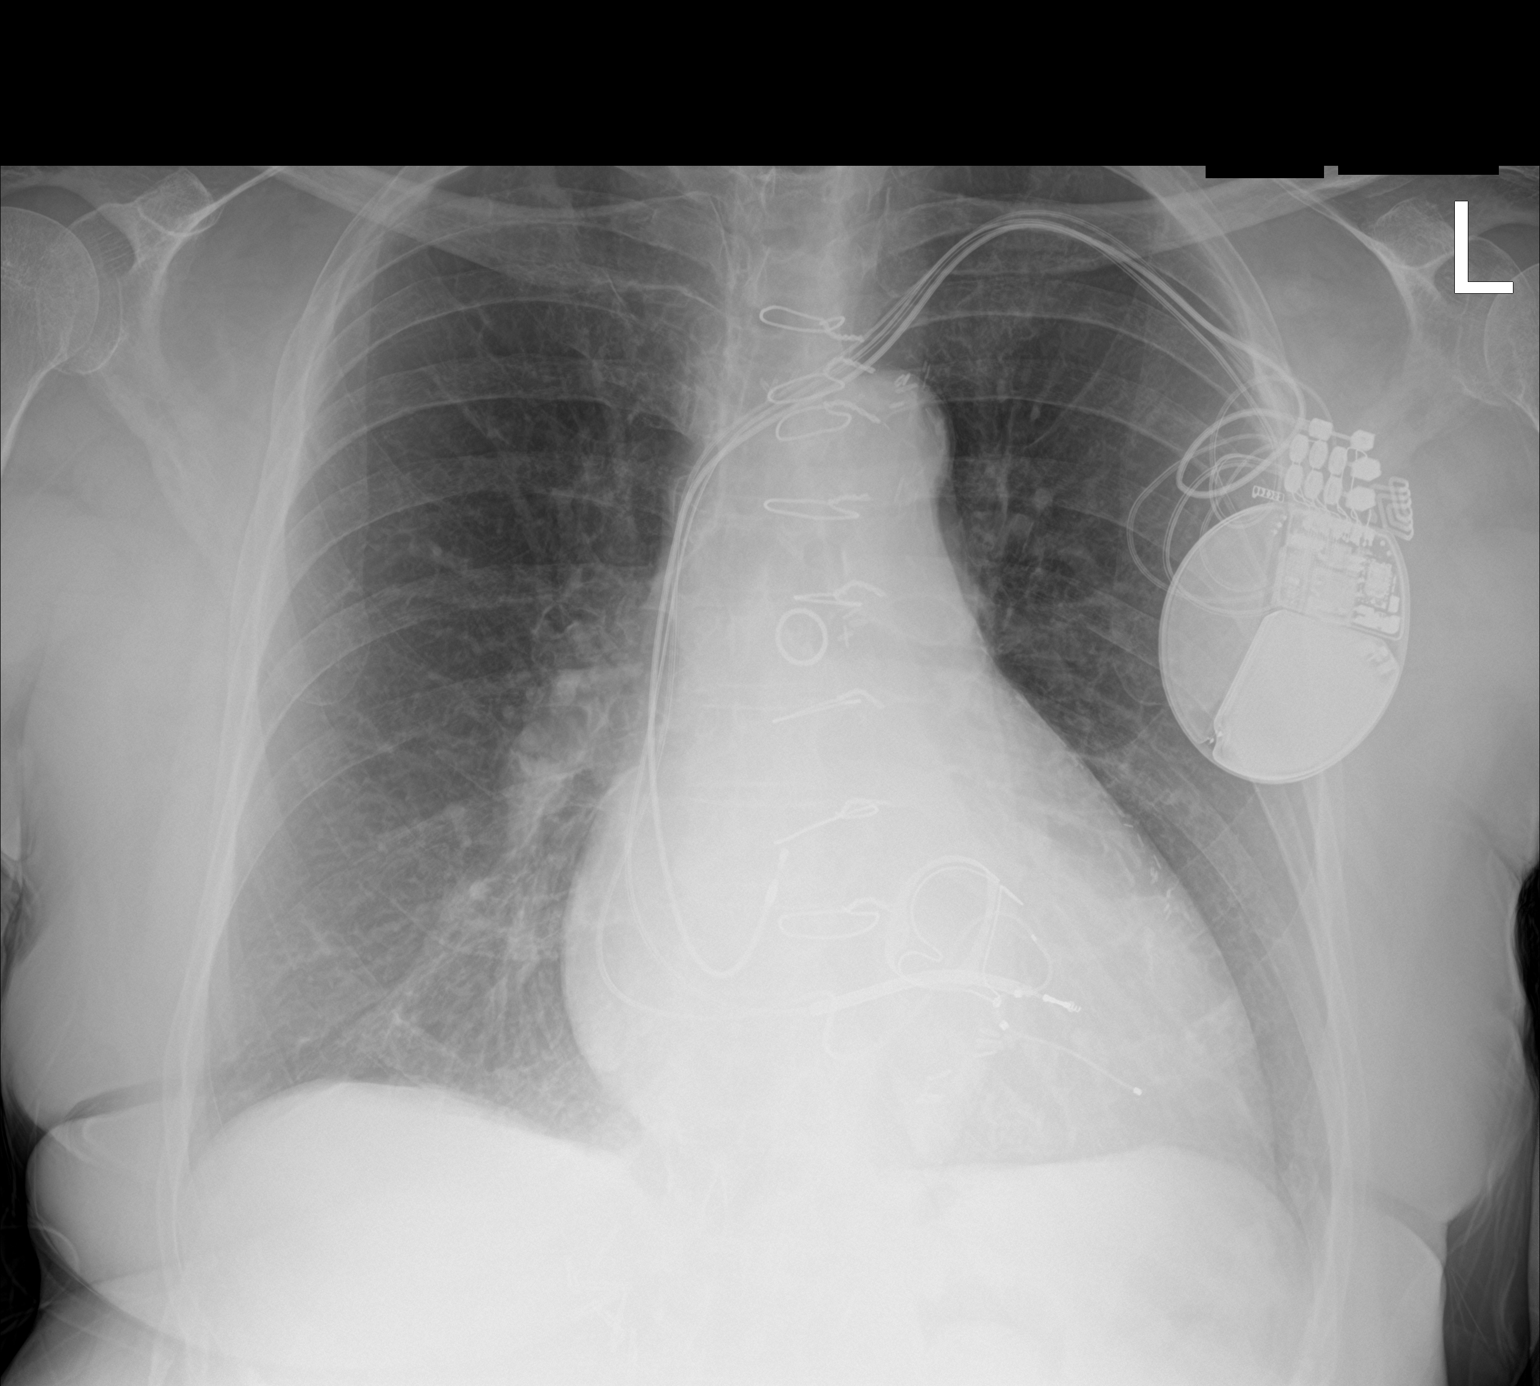

[chest lat]
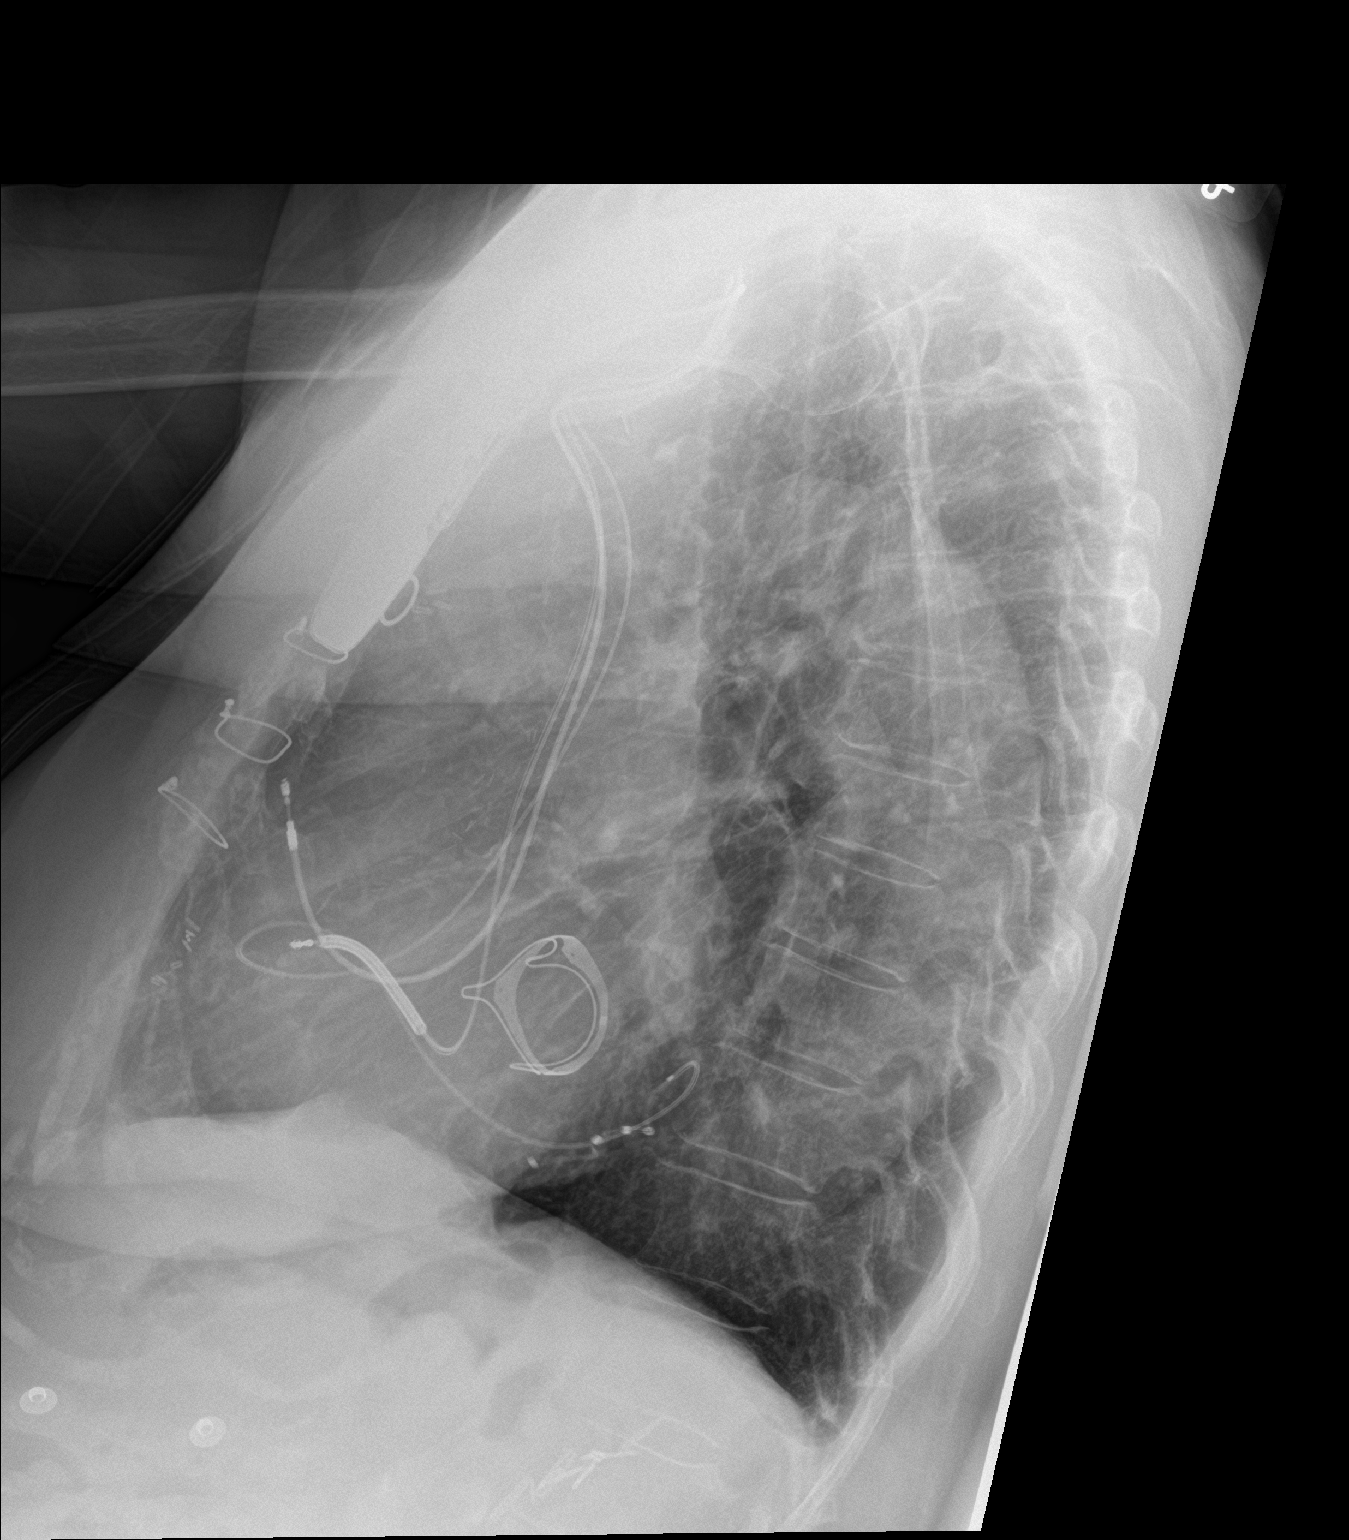

[2 of 2 positions shown; findings below may reference images not displayed]

FINDINGS: Prior median sternotomy, CABG and mitral valve replacement. Left
chest AICD/pacemaker with leads overlying the right atrium right
ventricle and coronary sinus. No focal consolidation. No pleural
effusion. No pneumothorax. The visualized skeletal structures are
unremarkable.
IMPRESSION: Cardiomegaly without overt edema or focal consolidation.

## 2020-12-02 MED ORDER — ONDANSETRON HCL 4 MG/2ML IJ SOLN: 4.0000 mg | Freq: Four times a day (QID) | INTRAMUSCULAR | Status: DC | PRN

## 2020-12-02 MED ORDER — SODIUM CHLORIDE 0.9 % IV BOLUS
500.0000 mL | Freq: Once | INTRAVENOUS | Status: AC
  Administered 2020-12-03: 500 mL via INTRAVENOUS

## 2020-12-02 MED ORDER — NITROGLYCERIN 0.4 MG SL SUBL: 0.4000 mg | SUBLINGUAL_TABLET | SUBLINGUAL | 3 refills | Status: DC | PRN

## 2020-12-02 MED ORDER — ACETAMINOPHEN 325 MG PO TABS: 650.0000 mg | ORAL_TABLET | ORAL | Status: DC | PRN

## 2020-12-02 NOTE — ED Provider Notes (Signed)
[facility] EMERGENCY DEPARTMENT Provider Note   CSN: [id number] Arrival date & time: 12/02/20  1642     History Chief Complaint  Patient presents with   Fatigue   Jacqueline Palmer is a 75 y.o. female.   Presented to ER with concern for chest pain and fatigue.  Had an episode of chest pain yesterday, central, radiating to right arm/shoulder.  Lasted around 20 minutes, improved with nitroglycerin.  No further pain today.  Has extensive past medical history including history of coronary artery disease s/p CABG, renal cell cancer s/p radical nephrectomy, no longer on any treatment.  A. fib on anticoagulation.  Patient today has felt generally fatigued.  Called cardiology office and was told to come to ER.  HPI     Past Medical History:  Diagnosis Date   Acute kidney injury ([place])    Atrial fibrillation ([place])    Cardiomyopathy ([place])    CHF (congestive heart failure) ([place])    Chronic a-fib ([place]) 08/14/2016   Chronic anticoagulation 08/14/2016   Coronary artery disease    4v CABG 6/17   Hypertension    Thrombus 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 myocardial infarction    Thyroid disease     Patient Active Problem List   Diagnosis Date Noted   Chest pain 12/02/2020   Neuropathic pain 11/25/2020   Seizures ([place]) 06/16/2020   Over weight 05/03/2020   Nonischemic cardiomyopathy ([place]) 03/04/2020   Biventricular automatic implantable cardioverter defibrillator in situ 02/19/2020   S/P MVR (mitral valve replacement) 02/19/2020   Chronic diastolic heart failure ([place]) 02/19/2020   Hx of CABG 02/19/2020   Coronary artery disease involving native coronary artery of native heart without angina pectoris 02/19/2020   Essential hypertension 02/19/2020   Permanent atrial fibrillation ([place]) 02/19/2020   CKD (chronic kidney disease) stage 4, GFR 15-29 ml/min ([place]) 02/19/2020   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     Family History  Problem Relation Age of Onset   Heart attack Mother    Heart attack Father    Heart attack Sister    Breast cancer Neg Hx     Social History   Tobacco Use   Smoking status: Former    Types: Cigarettes    Quit date: 07/30/2014    Years since quitting: 6.3   Smokeless tobacco: Never  Vaping Use   Vaping Use: Never used  Substance Use Topics   Alcohol use: Yes    Comment: 1 glass wine twice weekly   Drug use: No    Home Medications Prior to Admission medications   Medication Sig Start Date End Date Taking? Authorizing Provider  acetaminophen (TYLENOL) 500 MG tablet Take 1,000 mg by mouth in the morning and at bedtime.    [provider]  colchicine 0.6 MG tablet TAKE ONE TABLET BY MOUTH TWICE DAILY 11/23/20   Ladell Pier, MD  gabapentin (NEURONTIN) 300 MG capsule Take 1 capsule (300 mg total) by mouth 2 (two) times daily.  10/28/20   Ventura Sellers, MD  hydrALAZINE (APRESOLINE) 10 MG tablet TAKE ONE TABLET BY MOUTH THREE TIMES DAILY 08/29/20   Ladell Pier, MD  levETIRAcetam (KEPPRA) 500 MG tablet Take 1 tablet (500 mg total) by mouth 2 (two) times daily. 01/15/20 03/04/20  Eustaquio Maize, PA-C  levETIRAcetam (KEPPRA) 500 MG tablet One po bid 06/16/20   Sater, Nanine Means, MD  levothyroxine (SYNTHROID) 25 MCG tablet TAKE ONE TABLET BY MOUTH  ONCE A DAY ON AN EMPTY STOMACH WITH A FULL GLASS OF WATER 30-60 MINUTES BEFORE BREAKFAST 11/15/20   Ladell Pier, MD  metoprolol succinate (TOPROL-XL) 25 MG 24 hr tablet TAKE ONE TABLET BY MOUTH ONCE DAILY  11/15/20   Ladell Pier, MD  nitroGLYCERIN (NITROSTAT) 0.4 MG SL tablet Place 1 tablet (0.4 mg total) under the tongue every 5 (five) minutes as needed for chest pain. 12/02/20   Buford Dresser, MD  polyethylene glycol (MIRALAX / GLYCOLAX) 17 g packet Take 17 g by mouth daily as needed for mild constipation.    [provider]  pravastatin (PRAVACHOL) 20 MG tablet Take 20 mg by mouth daily.    [provider]  spironolactone (ALDACTONE) 25 MG tablet Take 1 tablet (25 mg total) by mouth daily. 08/16/20   Ladell Pier, MD  torsemide (DEMADEX) 20 MG tablet TAKE TWO TABLETS BY MOUTH IN THE MORNING AND ONE IN THE EVENING 11/15/20   Ladell Pier, MD  warfarin (COUMADIN) 5 MG tablet Take 1 tablet (5 mg total) by mouth See admin instructions. 2.5mg  Mon, [place] Thurs, Sat and 5mg  Wed, Fri, Sunday Patient taking differently: Take 5 mg by mouth every evening. 08/14/16   Barrett, Evelene Croon, PA-C    Allergies    Avocado, Banana, Plantain, Ace inhibitors, Codeine, Prednisone, Tape, and Latex  Review of Systems   Review of Systems  Constitutional:  Positive for fatigue. Negative for chills and fever.  HENT:  Negative for ear pain and sore throat.   Eyes:  Negative for pain and visual disturbance.  Respiratory:  Negative for cough and shortness of breath.   Cardiovascular:  Positive for chest pain. Negative for palpitations.  Gastrointestinal:  Negative for abdominal pain and vomiting.  Genitourinary:  Negative for dysuria and hematuria.  Musculoskeletal:  Negative for arthralgias and back pain.  Skin:  Negative for color change and rash.  Neurological:  Negative for seizures and syncope.  All other systems reviewed and are negative.  Physical Exam Updated Vital Signs BP 103/77   Pulse 70   Temp 98.7 F (37.1 C) (Oral)   Resp 11   SpO2 96%   Physical Exam Vitals and nursing note reviewed.  Constitutional:      General: She is not in acute distress.     Appearance: She is well-developed.  HENT:     Head: Normocephalic and atraumatic.  Eyes:     Conjunctiva/sclera: Conjunctivae normal.  Cardiovascular:     Rate and Rhythm: Normal rate and regular rhythm.     Heart sounds: No murmur heard. Pulmonary:     Effort: Pulmonary effort is normal. No respiratory distress.     Breath sounds: Normal breath sounds.  Abdominal:     Palpations: Abdomen is soft.     Tenderness: There is no abdominal tenderness.  Musculoskeletal:        General: No deformity or signs of injury.     Cervical back: Neck supple.  Skin:    General: Skin is warm  and dry.  Neurological:     General: No focal deficit present.     Mental Status: She is alert.    ED Results / Procedures / Treatments   Labs (all labs ordered are listed, but only abnormal results are displayed) Labs Reviewed  CBC WITH DIFFERENTIAL/PLATELET - Abnormal; Notable for the following components:      Result Value   RBC 5.15 (*)    MCV 75.1 (*)    MCH 23.7 (*)    All other components within normal limits  BASIC METABOLIC PANEL - Abnormal; Notable for the following components:   Sodium 131 (*)    Chloride 95 (*)    BUN 60 (*)    Creatinine, Ser 2.85 (*)    GFR, Estimated 17 (*)    All other components within normal limits  PROTIME-INR - Abnormal; Notable for the following components:   Prothrombin Time 23.7 (*)    INR 2.1 (*)    All other components within normal limits  URINALYSIS, ROUTINE W REFLEX MICROSCOPIC - Abnormal; Notable for the following components:   Color, Urine STRAW (*)    Bacteria, UA RARE (*)    All other components within normal limits  SARS CORONAVIRUS 2 (TAT 6-24 HRS)  TROPONIN I (HIGH SENSITIVITY)  TROPONIN I (HIGH SENSITIVITY)  TROPONIN I (HIGH SENSITIVITY)    EKG EKG Interpretation  Date/Time:  Thursday December 02 2020 16:44:51 EDT Ventricular Rate:  70 PR Interval:    QRS Duration: 100 QT Interval:  424 QTC Calculation: 457 R Axis:   63 Text  Interpretation: Ventricular-paced rhythm Biventricular pacemaker detected Abnormal ECG When compared with ECG of 01/15/2020, No significant change was found Confirmed by Delora Fuel ([id number]) on 12/02/2020 11:11:15 PM  Radiology DG Chest 2 View  Result Date: 12/02/2020 CLINICAL DATA:  Chest pain EXAM: CHEST - 2 VIEW COMPARISON:  February 03, 2020 and CT July 09, 2020 FINDINGS: Prior median sternotomy, CABG and mitral valve replacement. Left chest AICD/pacemaker with leads overlying the right atrium right ventricle and coronary sinus. No focal consolidation. No pleural effusion. No pneumothorax. The visualized skeletal structures are unremarkable. IMPRESSION: Cardiomegaly without overt edema or focal consolidation. Electronically Signed   By: Dahlia Bailiff MD   On: 12/02/2020 17:59   CUP PACEART REMOTE DEVICE CHECK  Result Date: 12/02/2020 Scheduled remote reviewed. Normal device function.  Known AF, programmed VVIR. Next remote in 91 days. Kathy Breach, RN, CCDS, [employer] last programmed on Oct 20, 2020. Beeper is OFF.   Procedures Procedures   Medications Ordered in ED Medications  sodium chloride 0.9 % bolus 500 mL (has no administration in time range)  acetaminophen (TYLENOL) tablet 650 mg (has no administration in time range)  ondansetron (ZOFRAN) injection 4 mg (has no administration in time range)    ED Course  I have reviewed the triage vital signs and the nursing notes.  Pertinent labs & imaging results that were available during my care of the patient were reviewed by me and considered in my medical decision making (see chart for details).    MDM Rules/Calculators/A&P                          75 year old lady presents to ER after having an episode of chest pain yesterday and feeling generally fatigued today.  On arrival here patient appears well in no distress.  No acute change on EKG.  Troponin within normal limits.  Labs noted  for mild AKI.   Suspect dehydration.  Provided some small fluids, has history of diastolic heart failure.  Given her cardiac history, discussed with cardiology, Dr. Jeannette Corpus.  He recommends admission for further observation.  Request hospitalist admit as primary, cardiology will consult.  Discussed with Dr. Flossie Buffy who will admit.  Final Clinical Impression(s) / ED Diagnoses Final diagnoses:  Chest pain, unspecified type  AKI (acute kidney injury) [facility])    Rx / DC Orders ED Discharge Orders     None        Lucrezia Starch, MD 12/02/20 2335

## 2020-12-02 NOTE — Telephone Encounter (Signed)
Pt c/o of Chest Pain: STAT if CP now or developed within 24 hours  1. Are you having CP right now? Not at this time- chest pain yesterday for about  30 minutes   2. Are you experiencing any other symptoms (ex. SOB, nausea, vomiting, sweating)? Sweating and nauseated yesterday  3. How long have you been experiencing CP? Yesterday was first time since last year  4. Is your CP continuous or coming and going? Coming and going  5. Have you taken Nitroglycerin? She took 2 nitroglycerins- pt would like to be seen asap ?

## 2020-12-02 NOTE — ED Triage Notes (Signed)
Pt c/o fatigue, achy after "bout of CP" yesterday. 2 NTG used, CP subsided. Came for further eval after speaking to cardiologist. [facility] for [employer]

## 2020-12-02 NOTE — ED Provider Notes (Signed)
Emergency Medicine Provider Triage Evaluation Note  Jacqueline Palmer , a 75 y.o. female  was evaluated in triage.  Pt complains of 1 episode of central chest pain that radiates to right shoulder/arm that occurred yesterday evening while brushing her teeth. Chest pain associated with nausea and diaphoresis. She took 2 nitros which resolved her pain. No shortness of breath. She is currently on Warfarin which she has been compliant with. History of previous MI which she notes this episode of chest pain felt worse. She called her cardiologist today and was advised to report to the ED for further evaluation. No further chest pain. She admits to fatigue currently.   Review of Systems  Positive: Chest pain Negative: fever  Physical Exam  There were no vitals taken for this visit. Gen:   Awake, no distress   Resp:  Normal effort  MSK:   Moves extremities without difficulty  Other:    Medical Decision Making  Medically screening exam initiated at 4:44 PM.  Appropriate orders placed.  Jacqueline Palmer was informed that the remainder of the evaluation will be completed by another provider, this initial triage assessment does not replace that evaluation, and the importance of remaining in the ED until their evaluation is complete.  Cardiac labs ordered.   Karie Kirks 12/02/20 1658    Dorie Rank, MD 12/05/20 [phone]

## 2020-12-02 NOTE — Consult Note (Signed)
Cardiology Consultation:   Patient ID: Jacqueline Palmer MRN: [id number]; DOB: [birth year]  Admit date: 12/02/2020 Date of Consult: 12/02/2020  PCP:  Ladell Pier, MD   [facility] Providers Cardiologist:  Buford Dresser, MD   {      Patient Profile:   Jacqueline Palmer is a 75 y.o. female with a hx of Brain metastases (from Renal Cell CA) s/p craniotomy & radiation tx, seizures, CAD s/p 4V CABG, MAZE, LAA ligation, bioprosthetic MVR (for rheumatic heart disease) 11/17/2015, permanent atrial fibrillation, chronic diastolic heart failure with recovered systolic dysfunction, hypothyroidism, CKD stage 4, and renal cancer s/p radical nephrectomy who is being seen 12/02/2020 for the evaluation of chest pain at the request of Lucrezia Starch, MD.  History of Present Illness:   [name] had an episode of chest pain yesterday (12/01/2020). She resides in a retirement community and the episode occurred while she was brushing her teeth. Pain radiated to the right arm. Some associated nausea and sweating. Relief after taking NTG x 2. No orthopnes, PND or leg swelling. No recurrence of the chest pain. Today has mostly noticed fatigue and occasional headaches.   Followed by Dr. Westley Gambles at [facility] for BiV-ICD. She is 100% BiV paced. Had one episode of sustained VT terminated by ATP. Has permanent atrial fibrillation, on coumadin. Appears she had AV node ablation 09/11/2016 at [facility] based on [place]. Admitted for HF at [facility] 10/2019, 11/2019. On coumadin and no reported bleeding episodes.  In the ED, the vitals were:125/96 mmHg, HR 70 bpm, RR 16 Hs-Trops: 10 and 10 Other labs were: Na 131, K 4.2, BUN 60, Cr 2.85, WBC 7.3, hct 38, Plt 305 CXR: Prior sternotomy, no focal consolidation. No pleural effusion. No pneumothorax. ECG showed atrial flutter and V-paced rhythm, rate 70 bpm   Past Medical History:  Diagnosis Date   Acute kidney injury ([place])    Atrial fibrillation (HCC)    Cardiomyopathy  ([place])    CHF (congestive heart failure) (HCC)    Chronic a-fib ([place]) 08/14/2016   Chronic anticoagulation 08/14/2016   Coronary artery disease    4v CABG 6/17   Hypertension    Thrombus 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 myocardial infarction    Thyroid disease     Past Surgical History:  Procedure Laterality Date   ABDOMINAL HYSTERECTOMY     CORONARY ARTERY BYPASS GRAFT     CRANIOTOMY Right 07/30/2019   Right frontal   NEPHRECTOMY Right 10/16/2019     Home Medications:  Prior to Admission medications   Medication Sig Start Date End Date Taking? Authorizing Provider  acetaminophen (TYLENOL) 500 MG tablet Take 1,000 mg by mouth in the morning and at bedtime.    [provider]  colchicine 0.6 MG tablet TAKE ONE TABLET BY MOUTH TWICE DAILY 11/23/20   Ladell Pier, MD  gabapentin (NEURONTIN) 300 MG capsule Take 1 capsule (300 mg total) by mouth 2 (two) times daily. 10/28/20   Ventura Sellers, MD  hydrALAZINE (APRESOLINE) 10 MG tablet TAKE ONE TABLET BY MOUTH THREE TIMES DAILY 08/29/20   Ladell Pier, MD  levETIRAcetam (KEPPRA) 500 MG tablet Take 1 tablet (500 mg total) by mouth 2 (two) times daily. 01/15/20 03/04/20  Eustaquio Maize, PA-C  levETIRAcetam (KEPPRA) 500 MG tablet One po bid 06/16/20   Sater, Nanine Means, MD  levothyroxine (SYNTHROID) 25 MCG tablet TAKE ONE TABLET BY MOUTH  ONCE A DAY ON AN EMPTY STOMACH WITH A FULL GLASS OF WATER 30-60  MINUTES BEFORE BREAKFAST 11/15/20   Ladell Pier, MD  metoprolol succinate (TOPROL-XL) 25 MG 24 hr tablet TAKE ONE TABLET BY MOUTH ONCE DAILY 11/15/20   Ladell Pier, MD  nitroGLYCERIN (NITROSTAT) 0.4 MG SL tablet Place 1 tablet (0.4 mg total) under the tongue every 5 (five) minutes as needed for chest pain. 12/02/20   Buford Dresser, MD  polyethylene glycol (MIRALAX / GLYCOLAX) 17 g packet Take 17 g by mouth daily as needed for mild constipation.    [provider]  pravastatin (PRAVACHOL) 20 MG  tablet Take 20 mg by mouth daily.    [provider]  spironolactone (ALDACTONE) 25 MG tablet Take 1 tablet (25 mg total) by mouth daily. 08/16/20   Ladell Pier, MD  torsemide (DEMADEX) 20 MG tablet TAKE TWO TABLETS BY MOUTH IN THE MORNING AND ONE IN THE EVENING 11/15/20   Ladell Pier, MD  warfarin (COUMADIN) 5 MG tablet Take 1 tablet (5 mg total) by mouth See admin instructions. 2.5mg  Mon, [place] Thurs, Sat and 5mg  Wed, Fri, Sunday Patient taking differently: Take 5 mg by mouth every evening. 08/14/16   Barrett, Evelene Croon, PA-C    Inpatient Medications: Scheduled Meds:  Continuous Infusions:  sodium chloride     PRN Meds: acetaminophen, ondansetron (ZOFRAN) IV  Allergies:    Allergies  Allergen Reactions   Avocado Shortness Of Breath    Before 2007   Banana Anaphylaxis and Swelling   Plantain Anaphylaxis and Swelling   Ace Inhibitors Cough   Codeine Other (See Comments)    Nausea and Feels Jittery   Prednisone Swelling    Of face and lower extremities   Tape Hives    Tape  Does better with paper tape  Tape  Does better with paper tape     Latex Rash    Social History:   Social History   Socioeconomic History   Marital status: Widowed    Spouse name: Not on file   Number of children: 0   Years of education: Doctorate   Highest education level: Master's degree (e.g., MA, MS, MEng, MEd, MSW, MBA)  Occupational History   Occupation: Retired Clinical biochemist  Tobacco Use   Smoking status: Former    Types: Cigarettes    Quit date: 07/30/2014    Years since quitting: 6.3   Smokeless tobacco: Never  Vaping Use   Vaping Use: Never used  Substance and Sexual Activity   Alcohol use: Yes    Comment: 1 glass wine twice weekly   Drug use: No   Sexual activity: Not on file  Other Topics Concern   Not on file  Social History Narrative   Right handed   Coffee daily/tea qod   Social Determinants of Health   Financial Resource Strain: Not on file  Food  Insecurity: Not on file  Transportation Needs: Not on file  Physical Activity: Not on file  Stress: Not on file  Social Connections: Not on file  Intimate Partner Violence: Not on file    Family History:   Family History  Problem Relation Age of Onset   Heart attack Mother    Heart attack Father    Heart attack Sister    Breast cancer Neg Hx      ROS:  Please see the history of present illness.   All other ROS reviewed and negative.     Physical Exam/Data:   Vitals:   12/02/20 1800 12/02/20 1943 12/02/20 2100 12/02/20 2230  BP:  117/79 130/85 115/82 103/77  Pulse: 71 70 70 70  Resp: 18 18 18 11   Temp:      TempSrc:      SpO2: 100% 100% 100% 96%   No intake or output data in the 24 hours ending 12/02/20 2318 Last 3 Weights 10/05/2020 08/05/2020 06/17/2020  Weight (lbs) 192 lb 8 oz 194 lb 1.6 oz 196 lb 12.8 oz  Weight (kg) 87.317 kg 88.043 kg 89.268 kg     There is no height or weight on file to calculate BMI.  General:  in no acute distress HEENT: normal Lymph: no adenopathy Neck: no JVD Endocrine:  No thryomegaly Vascular: No carotid bruits; FA pulses 2+ bilaterally without bruits  Cardiac:  normal S1, S2; RRR; no murmur  Lungs:  clear to auscultation bilaterally, no wheezing, rhonchi or rales  Abd: soft, nontender, no hepatomegaly  Ext: no edema Musculoskeletal:  No deformities, BUE and BLE strength normal and equal Skin: warm and dry  Neuro:  CNs 2-12 intact, no focal abnormalities noted Psych:  Normal affect   EKG:  The EKG was personally reviewed and demonstrates: atrial flutter and V-pacing   Relevant CV Studies: CIED follow up 02/10/20 ([facility]) Stable BSC VVIR CRTD battery (5.42yrs) function and lead trends.   BIVpacing=100% (CHB; PM DEPENDENT).   Two NSVT episodes.   One sustained VT espiode (avg=205bpm) terminated with one burst of ATP.   Permanent AFib. OAC=warfarin   Echo 11/03/19 ([facility]) NORMAL LEFT VENTRICULAR FUNCTION WITH MODERATE LVH  MILD RV  SYSTOLIC DYSFUNCTION (See above)  VALVULAR REGURGITATION: MILD AR, TRIVIAL MR, MILD PR  PROSTHETIC VALVE(S): BIOPROSTHETIC MV  TR REVERSAL STILL NOTED IN THE HEPATIC VEINS, C/W SEVERE TR   TEE 09/01/2016 ([facility]) Limited study to assess for LA/LAA thrombus pre-ablation/DCCV  1. Small amount of laminar thrombus in tip of LAA, smaller than prior TEE  (02/04/16)  2. Definity used demonstrates flow communication into LAA despite previous  ligation attempt and confirms thrombus in tip of LAA  3. No RA/RAA thrombus  4. Severe TR  Laboratory Data:  High Sensitivity Troponin:   Recent Labs  Lab 12/02/20 1652 12/02/20 1957  TROPONINIHS 10 10     Chemistry Recent Labs  Lab 12/02/20 1652  NA 131*  K 4.2  CL 95*  CO2 24  GLUCOSE 83  BUN 60*  CREATININE 2.85*  CALCIUM 9.7  GFRNONAA 17*  ANIONGAP 12    No results for input(s): PROT, ALBUMIN, AST, ALT, ALKPHOS, BILITOT in the last 168 hours. Hematology Recent Labs  Lab 12/02/20 1652  WBC 7.3  RBC 5.15*  HGB 12.2  HCT 38.7  MCV 75.1*  MCH 23.7*  MCHC 31.5  RDW 14.9  PLT 305   BNPNo results for input(s): BNP, PROBNP in the last 168 hours.  DDimer No results for input(s): DDIMER in the last 168 hours.   Radiology/Studies:  DG Chest 2 View  Result Date: 12/02/2020 CLINICAL DATA:  Chest pain EXAM: CHEST - 2 VIEW COMPARISON:  February 03, 2020 and CT July 09, 2020 FINDINGS: Prior median sternotomy, CABG and mitral valve replacement. Left chest AICD/pacemaker with leads overlying the right atrium right ventricle and coronary sinus. No focal consolidation. No pleural effusion. No pneumothorax. The visualized skeletal structures are unremarkable. IMPRESSION: Cardiomegaly without overt edema or focal consolidation. Electronically Signed   By: Dahlia Bailiff MD   On: 12/02/2020 17:59   CUP PACEART REMOTE DEVICE CHECK  Result Date: 12/02/2020 Scheduled remote reviewed. Normal device function.  Known AF, programmed VVIR. Next  remote in 91 days. Kathy Breach, RN, CCDS, [employer] last programmed on Oct 20, 2020. Beeper is OFF.    Assessment and Plan:   Chest pain The patient presents with vague symptoms. Has a hx of CABG (4-vessel) in 2017. The ECG only shows a paced rhythm. Trops are negative x 2. No chest pain today. Mostly has fatigue. Given baseline CKD, the patient will not be a good candidate for invasive options such as a cardiac catheterization procedure.  -Monitor on telemetry -Serial ECGs -Trend cardiac enzymes -Not on ASA since taking oral anticoagulation -Continue statin  2. Chronic diastolic heart failure Appears clinically euvolemic. CXR unremarkable. Echo from 10/2019 showed normal LV fx and LVH. Normal MV prosthesis function  -Strict I and Os -Daily weights -Continue metoprolol, hydralazine, spironolactone -Continue torsemide  3. Atrial fibrillation Rate controlled  -Continue warfarin   For questions or updates, please contact [facility] Please consult [email or web address] for contact info under    Signed, Meade Maw, MD  12/02/2020 11:18 PM

## 2020-12-02 NOTE — Telephone Encounter (Signed)
Returned call to patient of Dr. Harrell Gave She had chest pain yesterday for 15-64minutes -- took NTGx2 with relief Was "kind of out of it" for about 30 minutes Sweating and nauseous during same time  Last episode was over 1 year   Patient reports no cardiac concerns today - c/o drowsy and headache   BP was 170s/80s yesterday -- usually 120-130s/80s  Patient has h/o CABG, cardiomyopathy, ICD, AFib  Patient lives at [facility] and does not have a personal means to get to [facility] ED. Advised will call her facility and see how we can get her to the hospital for eval.  Spoke with Bailey Mech with [facility] health care with [facility] -- she checked on patient yesterday around time of episode -- she also reports patient had blurry vision. She reports her BP was 140s when she checked it. Explained to Bailey Mech that with patient's cardiac history, she should go to hospital for eval. She also states she advised patient to go to ED yesterday and she declined. She will work on coordinating transportation for patient. (Phone: [phone])  Georganna Skeans and notified her of patient's history and present concerns.

## 2020-12-03 ENCOUNTER — Encounter (HOSPITAL_COMMUNITY): Payer: Self-pay | Admitting: Family Medicine

## 2020-12-03 ENCOUNTER — Encounter (HOSPITAL_COMMUNITY): Payer: Self-pay

## 2020-12-03 ENCOUNTER — Other Ambulatory Visit: Payer: Self-pay

## 2020-12-03 ENCOUNTER — Observation Stay (HOSPITAL_BASED_OUTPATIENT_CLINIC_OR_DEPARTMENT_OTHER): Payer: Medicare Other

## 2020-12-03 DIAGNOSIS — I4821 Permanent atrial fibrillation: Secondary | ICD-10-CM

## 2020-12-03 DIAGNOSIS — N189 Chronic kidney disease, unspecified: Secondary | ICD-10-CM | POA: Diagnosis present

## 2020-12-03 DIAGNOSIS — I5042 Chronic combined systolic (congestive) and diastolic (congestive) heart failure: Secondary | ICD-10-CM

## 2020-12-03 DIAGNOSIS — R079 Chest pain, unspecified: Secondary | ICD-10-CM | POA: Diagnosis not present

## 2020-12-03 DIAGNOSIS — N179 Acute kidney failure, unspecified: Secondary | ICD-10-CM | POA: Diagnosis not present

## 2020-12-03 DIAGNOSIS — I10 Essential (primary) hypertension: Secondary | ICD-10-CM | POA: Diagnosis not present

## 2020-12-03 DIAGNOSIS — R791 Abnormal coagulation profile: Secondary | ICD-10-CM | POA: Diagnosis present

## 2020-12-03 LAB — ECHOCARDIOGRAM COMPLETE
Area-P 1/2: 1.65 cm2
Height: 68 in
MV VTI: 1.52 cm2
S' Lateral: 2.5 cm
Weight: 3136 oz

## 2020-12-03 LAB — PROTIME-INR
INR: 2.1 — ABNORMAL HIGH (ref 0.8–1.2)
Prothrombin Time: 23.4 seconds — ABNORMAL HIGH (ref 11.4–15.2)

## 2020-12-03 LAB — SARS CORONAVIRUS 2 (TAT 6-24 HRS): SARS Coronavirus 2: NEGATIVE

## 2020-12-03 LAB — TROPONIN I (HIGH SENSITIVITY): Troponin I (High Sensitivity): 11 ng/L (ref <18)

## 2020-12-03 MED ORDER — HYDRALAZINE HCL 10 MG PO TABS
10.0000 mg | ORAL_TABLET | Freq: Three times a day (TID) | ORAL | Status: DC
  Administered 2020-12-03: 10 mg via ORAL
  Filled 2020-12-03: qty 1

## 2020-12-03 MED ORDER — WARFARIN SODIUM 5 MG PO TABS: 5.0000 mg | ORAL_TABLET | ORAL | Status: DC

## 2020-12-03 MED ORDER — COLCHICINE 0.6 MG PO TABS
0.6000 mg | ORAL_TABLET | Freq: Every day | ORAL | Status: DC
Start: 2020-12-03 — End: 2020-12-14

## 2020-12-03 MED ORDER — WARFARIN SODIUM 7.5 MG PO TABS
7.5000 mg | ORAL_TABLET | ORAL | Status: DC
  Filled 2020-12-03: qty 1

## 2020-12-03 MED ORDER — ACETAMINOPHEN 500 MG PO TABS: 1000.0000 mg | ORAL_TABLET | Freq: Two times a day (BID) | ORAL | Status: DC

## 2020-12-03 MED ORDER — SPIRONOLACTONE 25 MG PO TABS
25.0000 mg | ORAL_TABLET | Freq: Every day | ORAL | Status: DC
  Administered 2020-12-03: 25 mg via ORAL
  Filled 2020-12-03: qty 1

## 2020-12-03 MED ORDER — WARFARIN SODIUM 7.5 MG PO TABS: 7.5000 mg | ORAL_TABLET | ORAL | Status: DC

## 2020-12-03 MED ORDER — GABAPENTIN 300 MG PO CAPS
300.0000 mg | ORAL_CAPSULE | Freq: Two times a day (BID) | ORAL | Status: DC
  Administered 2020-12-03 (×2): 300 mg via ORAL
  Filled 2020-12-03 (×2): qty 1

## 2020-12-03 MED ORDER — LEVOTHYROXINE SODIUM 25 MCG PO TABS
25.0000 ug | ORAL_TABLET | Freq: Every day | ORAL | Status: DC
  Administered 2020-12-03: 25 ug via ORAL
  Filled 2020-12-03: qty 1

## 2020-12-03 MED ORDER — WARFARIN SODIUM 5 MG PO TABS: 5.0000 mg | ORAL_TABLET | Freq: Every evening | ORAL | Status: DC

## 2020-12-03 MED ORDER — LEVETIRACETAM 500 MG PO TABS
500.0000 mg | ORAL_TABLET | Freq: Two times a day (BID) | ORAL | Status: DC
  Administered 2020-12-03: 500 mg via ORAL
  Filled 2020-12-03: qty 1

## 2020-12-03 MED ORDER — ACETAMINOPHEN 500 MG PO TABS
1000.0000 mg | ORAL_TABLET | Freq: Two times a day (BID) | ORAL | Status: DC
  Administered 2020-12-03 (×2): 1000 mg via ORAL
  Filled 2020-12-03 (×2): qty 2

## 2020-12-03 MED ORDER — COLCHICINE 0.6 MG PO TABS
0.6000 mg | ORAL_TABLET | Freq: Every day | ORAL | Status: DC
  Administered 2020-12-03: 0.6 mg via ORAL
  Filled 2020-12-03 (×2): qty 1

## 2020-12-03 MED ORDER — METOPROLOL SUCCINATE ER 25 MG PO TB24
25.0000 mg | ORAL_TABLET | Freq: Every morning | ORAL | Status: DC
  Administered 2020-12-03: 25 mg via ORAL
  Filled 2020-12-03: qty 1

## 2020-12-03 MED ORDER — PRAVASTATIN SODIUM 10 MG PO TABS: 20.0000 mg | ORAL_TABLET | Freq: Every day | ORAL | Status: DC

## 2020-12-03 MED ORDER — POLYETHYLENE GLYCOL 3350 17 G PO PACK: 17.0000 g | PACK | Freq: Every day | ORAL | Status: DC | PRN

## 2020-12-03 MED ORDER — WARFARIN SODIUM 5 MG PO TABS
5.0000 mg | ORAL_TABLET | ORAL | Status: DC
  Administered 2020-12-03: 5 mg via ORAL
  Filled 2020-12-03: qty 1

## 2020-12-03 MED ORDER — WARFARIN - PHARMACIST DOSING INPATIENT: Freq: Every day | Status: DC

## 2020-12-03 MED ORDER — COLCHICINE 0.6 MG PO TABS: 0.6000 mg | ORAL_TABLET | Freq: Every day | ORAL | Status: DC

## 2020-12-03 NOTE — Progress Notes (Signed)
[place] for warfarin Indication: atrial fibrillation  Allergies  Allergen Reactions   Avocado Shortness Of Breath    Before 2007   Banana Anaphylaxis and Swelling   Plantain Anaphylaxis and Swelling   Ace Inhibitors Cough   Codeine Other (See Comments)    Nausea and Feels Jittery   Prednisone Swelling    Of face and lower extremities   Tape Hives    Tape  Does better with paper tape  Tape  Does better with paper tape     Latex Rash    Patient Measurements: Height: 5\' 8"  (172.7 cm) Weight: 88.9 kg (196 lb) IBW/kg (Calculated) : 63.9  Vital Signs: Temp: 97.9 F (36.6 C) (07/15 0930) Temp Source: Oral (07/15 0930) BP: 104/77 (07/15 1100) Pulse Rate: 70 (07/15 1100)  Labs: Recent Labs    12/02/20 1652 12/02/20 1957 12/02/20 2201 12/03/20 0033 12/03/20 0328  HGB 12.2  --   --   --   --   HCT 38.7  --   --   --   --   PLT 305  --   --   --   --   LABPROT  --   --  23.7*  --  23.4*  INR  --   --  2.1*  --  2.1*  CREATININE 2.85*  --   --   --   --   TROPONINIHS 10 10  --  11  --      Estimated Creatinine Clearance: 19.9 mL/min (A) (by C-G formula based on SCr of 2.85 mg/dL (H)).   Medical History: Past Medical History:  Diagnosis Date   Acute kidney injury ([place])    Atrial fibrillation ([place])    Cardiomyopathy ([place])    CHF (congestive heart failure) ([place])    Chronic a-fib ([place]) 08/14/2016   Chronic anticoagulation 08/14/2016   Coronary artery disease    4v CABG 6/17   Hypertension    MVR 107mm Edwards Perimount Plus Bioprosthetic 11/17/2015   SN [id number] Model [id number] [From patient's surgical info card]   Thrombus 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 myocardial infarction    Thyroid disease     Assessment: 75yo female c/o fatigue and aches after episode of CP, was sent to ED by cards, to continue Coumadin for Afib during admission. Of note, H&P mentions INR goal 2.5-3.5 but outpatient AC OV notes state goal  is standard 2-3 - which was confirmed with patient.  PTA regimen 5 mg daily except 7.5 mg Mon/Fri.   Last dose was 7/13 - received 5 mg dose (normal Thursday dose this morning at 0330 to make up for 7/14 dose). INR remains therapeutic at 2.1. Hgb 12.2, plt 305. No s/sx of bleeding.   Goal of Therapy:  INR 2-3    Plan:  Will order warfarin 7.5 mg tonight as per home regimen Continue home Coumadin dose of 5mg  daily except 7.5 on Mondays/Friday and monitor INR.  Antonietta Jewel, PharmD, [place] Clinical Pharmacist  Phone: [phone] 12/03/2020 11:17 AM  Please check AMION for all [place] phone numbers After 10:00 PM, call [place] [phone]

## 2020-12-03 NOTE — Progress Notes (Addendum)
ANTICOAGULATION CONSULT NOTE - Initial Consult  Pharmacy Consult for warfarin Indication: atrial fibrillation  Allergies  Allergen Reactions   Avocado Shortness Of Breath    Before 2007   Banana Anaphylaxis and Swelling   Plantain Anaphylaxis and Swelling   Ace Inhibitors Cough   Codeine Other (See Comments)    Nausea and Feels Jittery   Prednisone Swelling    Of face and lower extremities   Tape Hives    Tape  Does better with paper tape  Tape  Does better with paper tape     Latex Rash    Patient Measurements: Height: 5\' 8"  (172.7 cm) Weight: 88.9 kg (196 lb) IBW/kg (Calculated) : 63.9  Vital Signs: Temp: 98.7 F (37.1 C) (07/14 1644) Temp Source: Oral (07/14 1644) BP: 124/83 (07/15 0030) Pulse Rate: 69 (07/15 0030)  Labs: Recent Labs    12/02/20 1652 12/02/20 1957 12/02/20 2201 12/03/20 0033  HGB 12.2  --   --   --   HCT 38.7  --   --   --   PLT 305  --   --   --   LABPROT  --   --  23.7*  --   INR  --   --  2.1*  --   CREATININE 2.85*  --   --   --   TROPONINIHS 10 10  --  11    Estimated Creatinine Clearance: 19.9 mL/min (A) (by C-G formula based on SCr of 2.85 mg/dL (H)).   Medical History: Past Medical History:  Diagnosis Date   Acute kidney injury ([place])    Atrial fibrillation ([place])    Cardiomyopathy ([place])    CHF (congestive heart failure) ([place])    Chronic a-fib ([place]) 08/14/2016   Chronic anticoagulation 08/14/2016   Coronary artery disease    4v CABG 6/17   Hypertension    Thrombus 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 myocardial infarction    Thyroid disease     Assessment: 75yo female c/o fatigue and aches after episode of CP, was sent to ED by cards, to continue Coumadin for Afib during admission; current INR at goal with last dose of Coumadin taken 7/13.  Of note, H&P mentions INR goal 2.5-3.5 but outpatient AC OV notes state goal is standard 2-3; will need to clarify in am.  Goal of Therapy:  INR 2-3 (will confirm)   Plan:   Continue home Coumadin dose of 5mg  daily except 7.5 on Mondays and monitor INR.  Wynona Neat, PharmD, BCPS  12/03/2020,2:44 AM

## 2020-12-03 NOTE — H&P (Signed)
History and Physical    Jacqueline Palmer ZOX:[id number] DOB: 1945/06/07 DOA: 12/02/2020  PCP: Ladell Pier, MD  Patient coming from: home  I have personally briefly reviewed patient's old medical records in [place]  Chief Complaint: chest pain  HPI: Jacqueline Palmer is a 75 y.o. 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  Yesterday she got up to her normal morning routine of brushing teeth and getting ready when she developed sudden acute onset mid sternal chest pain with radiation up to the right shoulder and partially down the arm.  Pain was squeezing in sensation.  She also felt dizzy, clammy and nauseous.  She then took 2 nitros about 5 minutes apart and pain subsided after 20 minutes.  However she continued to feel unwell with diaphoresis and dizziness for about 4 hours after.  She lives in a retirement home and her physical therapist came to check on her and took a blood pressure that was "okay." She did not have any more chest pain today but continued to feel unwell with flushing and diaphoresis and was prompted by her physical therapist to come to the ED.  ED Course: She was afebrile and normotensive on room air.  CBC was unremarkable.  Creatinine was elevated at 2.85 from prior 2.13.  Sodium of 131.  Troponin of 10.  EKG with ventricularly paced rhythm without any significant findings.  ED physician discussed with cardiology who will consult on the patient.  Hospitalist called for admission.  Review of Systems: Constitutional: No Weight Change, No Fever ENT/Mouth: No sore throat, No Rhinorrhea Eyes: No  Eye Pain, No Vision Changes Cardiovascular: +Chest Pain, no SOB, No PND, No Dyspnea on Exertion, No Orthopnea,  No Edema, No Palpitations Respiratory: No Cough, No Sputum, No Wheezing, no Dyspnea  Gastrointestinal: No Nausea, No Vomiting, No Diarrhea, No Constipation, No Pain Genitourinary: no Urinary Incontinence, No Urgency, No Flank Pain Musculoskeletal: No Arthralgias, No Myalgias Skin: No Skin Lesions, No Pruritus, Neuro: + Weakness, No Numbness,  No Loss of Consciousness, No Syncope Psych: No Anxiety/Panic, No Depression, no decrease appetite Heme/Lymph: No Bruising, No Bleeding  Past Medical History:  Diagnosis Date   Acute kidney injury (HCC)    Atrial fibrillation (HCC)    Cardiomyopathy (HCC)    CHF (congestive heart failure) (HCC)    Chronic a-fib (HCC) 08/14/2016   Chronic anticoagulation 08/14/2016   Coronary artery disease    4v CABG 6/17   Hypertension    Thrombus 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 myocardial infarction    Thyroid disease     Past Surgical History:  Procedure Laterality Date   ABDOMINAL HYSTERECTOMY     CORONARY ARTERY BYPASS GRAFT     CRANIOTOMY Right 07/30/2019   Right frontal   NEPHRECTOMY Right 10/16/2019     reports that she quit smoking about 6 years ago. Her smoking use included cigarettes. She has never used smokeless tobacco. She reports current alcohol use. She reports that she does not use drugs. Social History  Allergies  Allergen Reactions   Avocado Shortness Of Breath    Before 2007   Banana Anaphylaxis and Swelling   Plantain Anaphylaxis and Swelling   Ace Inhibitors  Cough   Codeine Other (See Comments)    Nausea and Feels Jittery   Prednisone Swelling    Of face and lower extremities   Tape Hives    Tape  Does better with paper tape  Tape  Does better with paper tape     Latex Rash    Family History  Problem Relation Age of Onset   Heart attack Mother    Heart attack Father    Heart attack Sister     Breast cancer Neg Hx      Prior to Admission medications   Medication Sig Start Date End Date Taking? Authorizing Provider  acetaminophen (TYLENOL) 500 MG tablet Take 1,000 mg by mouth in the morning and at bedtime.   Yes [provider]  colchicine 0.6 MG tablet TAKE ONE TABLET BY MOUTH TWICE DAILY Patient taking differently: Take 0.6 mg by mouth at bedtime. 11/23/20  Yes Ladell Pier, MD  gabapentin (NEURONTIN) 300 MG capsule Take 1 capsule (300 mg total) by mouth 2 (two) times daily. 10/28/20  Yes Vaslow, Acey Lav, MD  hydrALAZINE (APRESOLINE) 10 MG tablet TAKE ONE TABLET BY MOUTH THREE TIMES DAILY 08/29/20  Yes Ladell Pier, MD  levETIRAcetam (KEPPRA) 500 MG tablet One po bid Patient taking differently: Take 500 mg by mouth 2 (two) times daily. Morning and noon 06/16/20  Yes Sater, Nanine Means, MD  levothyroxine (SYNTHROID) 25 MCG tablet TAKE ONE TABLET BY MOUTH  ONCE A DAY ON AN EMPTY STOMACH WITH A FULL GLASS OF WATER 30-60 MINUTES BEFORE BREAKFAST Patient taking differently: Take 25 mcg by mouth every morning. 11/15/20  Yes Ladell Pier, MD  metoprolol succinate (TOPROL-XL) 25 MG 24 hr tablet TAKE ONE TABLET BY MOUTH ONCE DAILY Patient taking differently: Take 25 mg by mouth every morning. 11/15/20  Yes Ladell Pier, MD  nitroGLYCERIN (NITROSTAT) 0.4 MG SL tablet Place 1 tablet (0.4 mg total) under the tongue every 5 (five) minutes as needed for chest pain. 12/02/20  Yes Buford Dresser, MD  polyethylene glycol (MIRALAX / GLYCOLAX) 17 g packet Take 17 g by mouth daily as needed for mild constipation.   Yes [provider]  pravastatin (PRAVACHOL) 20 MG tablet Take 20 mg by mouth at bedtime.   Yes [provider]  spironolactone (ALDACTONE) 25 MG tablet Take 1 tablet (25 mg total) by mouth daily. 08/16/20  Yes Ladell Pier, MD  torsemide (DEMADEX) 20 MG tablet TAKE TWO TABLETS BY MOUTH IN THE MORNING AND ONE IN THE EVENING Patient  taking differently: Take 20-40 mg by mouth 2 (two) times daily. Take 40 mg in the morning and 20 mg in the evening 11/15/20  Yes Ladell Pier, MD  warfarin (COUMADIN) 5 MG tablet Take 1 tablet (5 mg total) by mouth See admin instructions. 2.5mg  Mon, Tues Thurs, Sat and 5mg  Wed, Fri, Sunday Patient taking differently: Take 5 mg by mouth every evening. 08/14/16  Yes Barrett, Evelene Croon, PA-C  levETIRAcetam (KEPPRA) 500 MG tablet Take 1 tablet (500 mg total) by mouth 2 (two) times daily. Patient not taking: Reported on 12/02/2020 01/15/20 03/04/20  Eustaquio Maize, PA-C    Physical Exam: Vitals:   12/02/20 2100 12/02/20 2230 12/02/20 2330 12/03/20 0009  BP: 115/82 103/77 114/84   Pulse: 70 70 70   Resp: 18 11 13    Temp:      TempSrc:      SpO2: 100% 96% 98%   Weight:    88.9 kg  Height:    5\' 8"  (1.727 m)    Constitutional: NAD, calm, comfortable, elderly female appearing younger than stated age sitting upright in bed Vitals:   12/02/20 2100 12/02/20 2230 12/02/20 2330 12/03/20 0009  BP: 115/82 103/77 114/84   Pulse: 70 70 70   Resp: 18 11 13    Temp:      TempSrc:      SpO2: 100% 96% 98%   Weight:    88.9 kg  Height:    5\' 8"  (1.727 m)   Eyes: PERRL, lids and conjunctivae normal ENMT: Mucous membranes are moist.  Neck: normal, supple Respiratory: clear to auscultation bilaterally, no wheezing, no crackles. Normal respiratory effort. No accessory muscle use.  Cardiovascular: Regular rate and rhythm, no murmurs / rubs / gallops. No extremity edema. 2+ pedal pulses.  Spoke to nursing with palpation. Abdomen: no tenderness, no masses palpated. Bowel sounds positive.  Musculoskeletal: no clubbing / cyanosis. No joint deformity upper and lower extremities. Good ROM, no contractures. Normal muscle tone.  Skin: no rashes, lesions, ulcers. No induration Neurologic: CN 2-12 grossly intact. Sensation intact,  Strength 5/5 in all 4.  Psychiatric: Normal judgment and insight. Alert and  oriented x 3. Normal mood.     Labs on Admission: I have personally reviewed following labs and imaging studies  CBC: Recent Labs  Lab 12/02/20 1652  WBC 7.3  NEUTROABS 3.6  HGB 12.2  HCT 38.7  MCV 75.1*  PLT 716   Basic Metabolic Panel: Recent Labs  Lab 12/02/20 1652  NA 131*  K 4.2  CL 95*  CO2 24  GLUCOSE 83  BUN 60*  CREATININE 2.85*  CALCIUM 9.7   GFR: Estimated Creatinine Clearance: 19.9 mL/min (A) (by C-G formula based on SCr of 2.85 mg/dL (H)). Liver Function Tests: No results for input(s): AST, ALT, ALKPHOS, BILITOT, PROT, ALBUMIN in the last 168 hours. No results for input(s): LIPASE, AMYLASE in the last 168 hours. No results for input(s): AMMONIA in the last 168 hours. Coagulation Profile: Recent Labs  Lab 12/02/20 2201  INR 2.1*   Cardiac Enzymes: No results for input(s): CKTOTAL, CKMB, CKMBINDEX, TROPONINI in the last 168 hours. BNP (last 3 results) No results for input(s): PROBNP in the last 8760 hours. HbA1C: No results for input(s): HGBA1C in the last 72 hours. CBG: No results for input(s): GLUCAP in the last 168 hours. Lipid Profile: No results for input(s): CHOL, HDL, LDLCALC, TRIG, CHOLHDL, LDLDIRECT in the last 72 hours. Thyroid Function Tests: No results for input(s): TSH, T4TOTAL, FREET4, T3FREE, THYROIDAB in the last 72 hours. Anemia Panel: No results for input(s): VITAMINB12, FOLATE, FERRITIN, TIBC, IRON, RETICCTPCT in the last 72 hours. Urine analysis:    Component Value Date/Time   COLORURINE STRAW (A) 12/02/2020 2216   APPEARANCEUR CLEAR 12/02/2020 2216   LABSPEC 1.008 12/02/2020 2216   PHURINE 6.0 12/02/2020 2216   GLUCOSEU NEGATIVE 12/02/2020 2216   HGBUR NEGATIVE 12/02/2020 2216   [place] NEGATIVE 12/02/2020 2216   [place] NEGATIVE 12/02/2020 2216   PROTEINUR NEGATIVE 12/02/2020 2216   NITRITE NEGATIVE 12/02/2020 2216   LEUKOCYTESUR NEGATIVE 12/02/2020 2216    Radiological Exams on Admission: DG Chest 2  View  Result Date: 12/02/2020 CLINICAL DATA:  Chest pain EXAM: CHEST - 2 VIEW COMPARISON:  February 03, 2020 and CT July 09, 2020 FINDINGS: Prior median sternotomy, CABG and mitral valve replacement. Left chest AICD/pacemaker with leads overlying the right atrium right ventricle and coronary sinus. No focal consolidation. No pleural effusion.  No pneumothorax. The visualized skeletal structures are unremarkable. IMPRESSION: Cardiomegaly without overt edema or focal consolidation. Electronically Signed   By: Dahlia Bailiff MD   On: 12/02/2020 17:59   CUP PACEART REMOTE DEVICE CHECK  Result Date: 12/02/2020 Scheduled remote reviewed. Normal device function.  Known AF, programmed VVIR. Next remote in 91 days. Kathy Breach, RN, CCDS, [employer] last programmed on Oct 20, 2020. Beeper is OFF.     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 Has hx of VT treated with ATP schemes - will interrogate device   Permanent a.fib with hx of LAA thrombus/subtherapeutic INR -Goal INR of 2.5-3.5 - pharmacy consulted for warfarin dosing  Renal cell carcinoma with brain metastasis s/p resection and craniotomy -follows with Dr. Mickeal Skinner neuro-oncology -had right sided nerve pain in May but had negative MRI Total spine in June  hx of seizure -continue  Keppra  DVT prophylaxis: Eliquis Code Status: Full Family Communication: Plan discussed with patient at bedside  disposition Plan: Home with observation Consults called: Cardiology Admission status: Observation  Level of care: Progressive  Status is: Observation  The patient remains OBS appropriate and will d/c before 2 midnights.  Dispo: The patient is from: Home              Anticipated d/c is to: Home              Patient currently is not medically stable to d/c.   Difficult to place patient No         Orene Desanctis DO [facility]   If 7PM-7AM, please contact night-coverage [email or web address]   12/03/2020, 12:33 AM

## 2020-12-03 NOTE — Progress Notes (Signed)
Echocardiogram 2D Echocardiogram has been performed.  Oneal Deputy Vue Pavon RDCS 12/03/2020, 10:16 AM

## 2020-12-03 NOTE — Progress Notes (Signed)
Progress Note  Patient Name: Jacqueline Palmer Date of Encounter: 12/03/2020  [facility] Cardiologist: Buford Dresser, MD   Subjective   No acute overnight events. No recurrent chest pain since event on 12/01/2020. No shortness of breath. She still feels more fatigued than usual.  Discussed presenting event. She states she woke up on Wednesday 12/01/2020 and was brushing her teeth when she started to feel hot and dizzy. A couple of minutes later she developed squeezing chest pain that radiated to her right neck/arm. She took 2 doses of sublingual Nitro and pain resolved after about 20 minutes. She had some nausea with the pain but no shortness of breath. No recurrence since then but she has just felt more fatigued since then.   Inpatient Medications    Scheduled Meds:  acetaminophen  1,000 mg Oral BID   colchicine  0.6 mg Oral QHS   gabapentin  300 mg Oral BID   hydrALAZINE  10 mg Oral TID   levETIRAcetam  500 mg Oral BID   levothyroxine  25 mcg Oral QAC breakfast   metoprolol succinate  25 mg Oral q morning   pravastatin  20 mg Oral QHS   spironolactone  25 mg Oral Daily   [START ON 12/04/2020] warfarin  5 mg Oral Once per day on Sun Tue Wed Thu Sat   warfarin  7.5 mg Oral Once per day on Mon Fri   Warfarin - Pharmacist Dosing Inpatient   Does not apply q1600   Continuous Infusions:  PRN Meds: ondansetron (ZOFRAN) IV, polyethylene glycol   Vital Signs    Vitals:   12/03/20 0930 12/03/20 1000 12/03/20 1030 12/03/20 1100  BP: (!) 123/106 97/77 113/88 104/77  Pulse: 70 70 70 70  Resp: 16 13 (!) 24 (!) 27  Temp: 97.9 F (36.6 C)     TempSrc: Oral     SpO2: 96% 95% 94% 97%  Weight:      Height:       No intake or output data in the 24 hours ending 12/03/20 1129 Last 3 Weights 12/03/2020 10/05/2020 08/05/2020  Weight (lbs) 196 lb 192 lb 8 oz 194 lb 1.6 oz  Weight (kg) 88.905 kg 87.317 kg 88.043 kg      Telemetry    Ventricular paced rhythm with underlying atrial  flutter. Rates in the 63s. - Personally Reviewed  ECG    EKG from 12/02/2020 showed ventricular paced rhythm with underlying atrial flutter, rate 70 bpm. - Personally Reviewed  Physical Exam   GEN: No acute distress.   Neck: No JVD. Cardiac: RRR. No murmurs, rubs, or gallops.  Respiratory: Clear to auscultation bilaterally. No wheezes, rhonchi, or rales. GI: Soft, non-distended, and non-tender. MS: No significant lower extremity edema. No deformity. Skin: Warm and dry. Neuro:  No focal deficits. Psych: Normal affect. Responds appropriately.   Labs    High Sensitivity Troponin:   Recent Labs  Lab 12/02/20 1652 12/02/20 1957 12/03/20 0033  TROPONINIHS 10 10 11       Chemistry Recent Labs  Lab 12/02/20 1652  NA 131*  K 4.2  CL 95*  CO2 24  GLUCOSE 83  BUN 60*  CREATININE 2.85*  CALCIUM 9.7  GFRNONAA 17*  ANIONGAP 12     Hematology Recent Labs  Lab 12/02/20 1652  WBC 7.3  RBC 5.15*  HGB 12.2  HCT 38.7  MCV 75.1*  MCH 23.7*  MCHC 31.5  RDW 14.9  PLT 305    BNPNo results for input(s): BNP,  PROBNP in the last 168 hours.   DDimer No results for input(s): DDIMER in the last 168 hours.   Radiology    DG Chest 2 View  Result Date: 12/02/2020 CLINICAL DATA:  Chest pain EXAM: CHEST - 2 VIEW COMPARISON:  February 03, 2020 and CT July 09, 2020 FINDINGS: Prior median sternotomy, CABG and mitral valve replacement. Left chest AICD/pacemaker with leads overlying the right atrium right ventricle and coronary sinus. No focal consolidation. No pleural effusion. No pneumothorax. The visualized skeletal structures are unremarkable. IMPRESSION: Cardiomegaly without overt edema or focal consolidation. Electronically Signed   By: Dahlia Bailiff MD   On: 12/02/2020 17:59   CUP PACEART REMOTE DEVICE CHECK  Result Date: 12/02/2020 Scheduled remote reviewed. Normal device function.  Known AF, programmed VVIR. Next remote in 91 days. Kathy Breach, RN, CCDS, CV Remote  [employer] Protection last programmed on Oct 20, 2020. Beeper is OFF.   Cardiac Studies   Echo pending.  Patient Profile     75 y.o. female with a history of CAD s/p CABG x4 in 10/2015, chronic combined CHF with recovered EF, permanent atrial fibrillation/flutter on Coumadin s/p MAZE and left atrial appendage ligation in 2017 at time of CABG, s/p AV node ablation and placement of BiV ICD, sustained VT requiring ATP, rheumatic heart disease s/p bioprosthetic MVR in 2017 at time of CABG, hypothyroidism, CKD stage IV, metastatic renal cancer s/p radial nephrectomy and craniotomy for resection of brain tumor who presented on 12/02/2020 with chest pain.  Assessment & Plan    Chest Pain History of CABG - Patient presented for further evaluation chest pain. Improved with Nitro. Isolated event. No recurrence.  - EKG shows ventricular paced rhythm. - High-sensitivity troponin negative x3. - Echo pending. - Episode somewhat atypical. Was preceded by dizziness and feeling hot. She has had a history of sustained VT requiring ATP. However, remote device interrogation yesterday did not show any concerning events. Given isolated event, negative enzymes, and CKD stage IV, no ischemic work-up planned as she would not be a good cardiac catheterization candidate.  - No aspirin due to need for anticoagulation.  - Continue beta-blocker and statin.  Chronic Combined CHF - EF normalized on last Echo in 10/2019 at [facility].  - Updated Echo pending.  - Appears euvolemic on exam.  - On Toprol-XL 25mg  daily and Spironolactone 25mg  daily at home. Continue Toprol-XL. May need to stop Spironolactone due to worsening renal function. - No ACEi/ARB due to CKD stage IV.  Permanent Atrial Fibrillation/Flutter S/p MAZE procedure and left atrial appendage  S/p AV nodal ablation with placement of BiV - EKG and telemetry shows ventricular paced rhythm with what looks like underlying atrial flutter. Rate controlled.  - Continue  Toprol-XL 25mg  daily.  - Continue chronic anticoagulation with Coumadin. Dosing per Pharmacy.  Rheumatic Heart Disease s/p MVR with Bioprosthetic Valve - No murmur noted on exam.  - Can reassess on repeat Echo.   Hypertension - BP well controlled.  - Continue medications for CHF.  Metastatic Renal Cancer s/p Radical Nephrectomy CKD Stage IV - Creatinine 2.85 on admission. Baseline around 2.0 to 2.1.  - Consider stopping Spironolactone.    For questions or updates, please contact [place] Please consult [email or web address] for contact info under        Signed, Darreld Mclean, PA-C  12/03/2020, 11:29 AM

## 2020-12-03 NOTE — ED Notes (Signed)
Pt transported to ECHO. 

## 2020-12-03 NOTE — Discharge Summary (Signed)
Physician Discharge Summary  Jacqueline Palmer QPR:[id number] DOB: 1945/07/09 DOA: 12/02/2020  PCP: Jacqueline Pier, MD  Admit date: 12/02/2020 Discharge date: 12/03/2020  Admitted From: home Disposition:  home  Recommendations for Outpatient Follow-up:  Follow up with PCP in 1-2 weeks Please obtain BMP/CBC in one week Please follow up with cardiology as scheduled or call if need seen earlier.  Home Health: No  Equipment/Devices: none  Discharge Condition: stable  CODE STATUS: full   Diet recommendation: Heart Healthy     Discharge Diagnoses: Principal Problem:   Chest pain Active Problems:   Acute-on-chronic kidney injury ([facility])   Subtherapeutic international normalized ratio (INR)   Chronic a-fib (HCC)   Mitral valve replaced   Hx of CABG   Essential hypertension   CKD (chronic kidney disease) stage 4, GFR 15-29 ml/min (HCC)   Nonischemic cardiomyopathy (HCC)   Chronic anticoagulation   Renal cell carcinoma of right kidney (HCC)   Brain metastasis (HCC)   Seizures (HCC)    Summary of HPI and Hospital Course:  Per H&P by Jacqueline Palmer: "Jacqueline Palmer is a 75 y.o. 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   Yesterday she got up to her normal morning routine of brushing teeth and getting ready when she developed sudden acute onset mid sternal chest pain with radiation up to the right shoulder and partially down the arm.  Pain was squeezing in sensation.  She also felt dizzy, clammy and nauseous.  She then took 2 nitros about 5 minutes apart and pain subsided after 20 minutes.  However she continued to feel unwell  with diaphoresis and dizziness for about 4 hours after.  She lives in a retirement home and her physical therapist came to check on her and took a blood pressure that was "okay." She did not have any more chest pain today but continued to feel unwell with flushing and diaphoresis and was prompted by her physical therapist to come to the ED.   ED Course: She was afebrile and normotensive on room air.  CBC was unremarkable.  Creatinine was elevated at 2.85 from prior 2.13.  Sodium of 131.  Troponin of 10.  EKG with ventricularly paced rhythm without any significant findings.   ED physician discussed with cardiology who will consult on the patient.  Hospitalist called for admission."  Admitted for observation and further evaluation.   Troponin trended and negative x 3. Echo with EF 60-65% without WMA's or other concerning acute findings. Cardiology consulted.  Patient's episode of chest pain by history sounds consistent with vasovagal event, and non-cardiac in nature. Given it was an isolated event and she otherwise has been asymptomatic from cardiac standpoint, no stress test or further evaluation was recommended.  She has had no recurrence of chest pain since arrival, and is medically stable for discharge home today.    Patient to follow up with cardiology as scheduled with Dr. Harrell Palmer.    Discharge Instructions   Discharge Instructions     Call MD for:  extreme fatigue   Complete by: As directed    Call MD for:  persistant dizziness or light-headedness   Complete by: As directed    Call MD for:  severe uncontrolled pain   Complete by: As directed    Call MD for:  temperature >100.4   Complete by: As directed    Diet - low sodium heart healthy   Complete by: As directed    Increase activity slowly   Complete by: As directed       Allergies as of 12/03/2020       Reactions   Avocado Shortness Of Breath   Before 2007   Banana Anaphylaxis, Swelling   Plantain Anaphylaxis,  Swelling   Ace Inhibitors Cough   Codeine Other (See Comments)   Nausea and Feels Jittery   Prednisone Swelling   Of face and lower extremities   Tape Hives   Tape  Does better with paper tape  Tape  Does better with paper tape    Latex Rash        Medication List     TAKE these medications    acetaminophen 500 MG tablet Commonly known as: TYLENOL Take 1,000 mg by mouth in the morning and at bedtime.   colchicine 0.6 MG tablet Take 1 tablet (0.6 mg total) by mouth at bedtime.   gabapentin 300 MG capsule Commonly known as: NEURONTIN Take 1 capsule (300 mg total) by mouth 2 (two) times daily.   hydrALAZINE 10 MG tablet Commonly known as: APRESOLINE TAKE ONE TABLET BY MOUTH THREE TIMES DAILY   levETIRAcetam 500 MG tablet Commonly known as: Keppra Take 1 tablet (500 mg total) by mouth 2 (two) times daily. What changed: Another medication with the same name was changed. Make sure you understand how and when to take each.   levETIRAcetam 500 MG tablet Commonly known as: KEPPRA One po bid What changed:  how much to take how to take this when to take this additional instructions   levothyroxine 25 MCG tablet Commonly known as: SYNTHROID TAKE ONE TABLET BY MOUTH  ONCE A DAY ON AN EMPTY STOMACH WITH A FULL GLASS OF WATER 30-60 MINUTES BEFORE BREAKFAST What changed: See the new instructions.   metoprolol succinate 25 MG 24 hr tablet Commonly known as: TOPROL-XL TAKE ONE TABLET BY MOUTH ONCE DAILY What changed: when to take this   nitroGLYCERIN 0.4 MG SL tablet Commonly known as: NITROSTAT Place 1 tablet (0.4 mg total) under the tongue every 5 (five) minutes as needed for chest pain.   polyethylene glycol 17 g packet Commonly known as: MIRALAX / GLYCOLAX Take 17 g by mouth daily as needed for mild constipation.   pravastatin 20 MG tablet Commonly known as: PRAVACHOL Take 20 mg by mouth at bedtime.   spironolactone 25 MG tablet Commonly known as:  ALDACTONE Take 1 tablet (25 mg total) by mouth daily.   torsemide 20 MG tablet Commonly known as: DEMADEX TAKE TWO TABLETS BY MOUTH IN THE MORNING AND ONE IN THE EVENING What changed: See the new instructions.   warfarin 5 MG tablet Commonly known as: COUMADIN Take as directed. If you are unsure how to take this medication, talk to your nurse or doctor. Original instructions: Take 1 tablet (5 mg total) by mouth every evening.        Allergies  Allergen Reactions   Avocado Shortness Of Breath    Before 2007   Banana Anaphylaxis and Swelling   Plantain Anaphylaxis and Swelling   Ace Inhibitors Cough   Codeine Other (See Comments)    Nausea and Feels Jittery   Prednisone Swelling    Of face and lower extremities   Tape  Hives    Tape  Does better with paper tape  Tape  Does better with paper tape     Latex Rash     If you experience worsening of your admission symptoms, develop shortness of breath, life threatening emergency, suicidal or homicidal thoughts you must seek medical attention immediately by calling 911 or calling your MD immediately  if symptoms less severe.    Please note   You were cared for by a hospitalist during your hospital stay. If you have any questions about your discharge medications or the care you received while you were in the hospital after you are discharged, you can call the unit and asked to speak with the hospitalist on call if the hospitalist that took care of you is not available. Once you are discharged, your primary care physician will handle any further medical issues. Please note that NO REFILLS for any discharge medications will be authorized once you are discharged, as it is imperative that you return to your primary care physician (or establish a relationship with a primary care physician if you do not have one) for your aftercare needs so that they can reassess your need for medications and monitor your lab  values.   Consultations: Cardiology   Procedures/Studies: DG Chest 2 View  Result Date: 12/02/2020 CLINICAL DATA:  Chest pain EXAM: CHEST - 2 VIEW COMPARISON:  February 03, 2020 and CT July 09, 2020 FINDINGS: Prior median sternotomy, CABG and mitral valve replacement. Left chest AICD/pacemaker with leads overlying the right atrium right ventricle and coronary sinus. No focal consolidation. No pleural effusion. No pneumothorax. The visualized skeletal structures are unremarkable. IMPRESSION: Cardiomegaly without overt edema or focal consolidation. Electronically Signed   By: Dahlia Bailiff MD   On: 12/02/2020 17:59   ECHOCARDIOGRAM COMPLETE  Result Date: 12/03/2020    ECHOCARDIOGRAM REPORT   Patient Name:   Dorothea Ogle Date of Exam: 12/03/2020 Medical Rec #:  [id number]    Height:       68.0 in Accession #:    [id number]   Weight:       196.0 lb Date of Birth:  09-03-1945    BSA:          2.026 m Patient Age:    26 years     BP:           113/84 mmHg Patient Gender: F            HR:           70 bpm. Exam Location:  Inpatient Procedure: 2D Echo, Color Doppler and Cardiac Doppler Indications:    R07.9* Chest pain, unspecified  History:        Patient has prior history of Echocardiogram examinations, most                 recent 11/03/2019. CHF, Prior CABG and Defibrillator,                 Arrythmias:Atrial Fibrillation; Risk Factors:Hypertension.                 11/17/15 MVR 31mm Edwards Perimount Plus Bioprosthetic.                  Mitral Valve: valve is present in the mitral position.  Sonographer:    Raquel Sarna Senior RDCS Referring Phys: [id number] [place]  1. Left ventricular ejection fraction, by estimation, is 60 to 65%. The left ventricle has normal function. The left ventricle has  no regional wall motion abnormalities. Left ventricular diastolic function could not be evaluated.  2. Right ventricular systolic function is normal. The right ventricular size is not well visualized. There  is normal pulmonary artery systolic pressure. The estimated right ventricular systolic pressure is 29.5 mmHg.  3. Left atrial size was moderately dilated.  4. Right atrial size was mildly dilated.  5. The mitral valve has been repaired/replaced. No evidence of mitral valve regurgitation. The mean mitral valve gradient is 3.0 mmHg. There is a present in the mitral position.  6. The aortic valve is tricuspid. Aortic valve regurgitation is trivial. No aortic stenosis is present.  7. Aortic dilatation noted. There is mild dilatation of the ascending aorta, measuring 42 mm.  8. The inferior vena cava is normal in size with greater than 50% respiratory variability, suggesting right atrial pressure of 3 mmHg. Comparison(s): Prior images unable to be directly viewed, comparison made by report only. FINDINGS  Left Ventricle: Left ventricular ejection fraction, by estimation, is 60 to 65%. The left ventricle has normal function. The left ventricle has no regional wall motion abnormalities. The left ventricular internal cavity size was normal in size. There is  no left ventricular hypertrophy. Abnormal (paradoxical) septal motion consistent with post-operative status. Left ventricular diastolic function could not be evaluated due to mitral valve replacement. Left ventricular diastolic function could not be evaluated. Right Ventricle: The right ventricular size is not well visualized. Right vetricular wall thickness was not well visualized. Right ventricular systolic function is normal. There is normal pulmonary artery systolic pressure. The tricuspid regurgitant velocity is 2.39 m/s, and with an assumed right atrial pressure of 3 mmHg, the estimated right ventricular systolic pressure is 28.4 mmHg. Left Atrium: Left atrial size was moderately dilated. Right Atrium: Right atrial size was mildly dilated. Pericardium: There is no evidence of pericardial effusion. Mitral Valve: The mitral valve has been repaired/replaced. No  evidence of mitral valve regurgitation. There is a present in the mitral position. MV peak gradient, 6.2 mmHg. The mean mitral valve gradient is 3.0 mmHg with average heart rate of 70 bpm. Tricuspid Valve: The tricuspid valve is normal in structure. Tricuspid valve regurgitation is mild. Aortic Valve: The aortic valve is tricuspid. Aortic valve regurgitation is trivial. No aortic stenosis is present. Pulmonic Valve: The pulmonic valve was not well visualized. Pulmonic valve regurgitation is mild. Aorta: The aortic root is normal in size and structure and aortic dilatation noted. There is mild dilatation of the ascending aorta, measuring 42 mm. Venous: The inferior vena cava is normal in size with greater than 50% respiratory variability, suggesting right atrial pressure of 3 mmHg. IAS/Shunts: No atrial level shunt detected by color flow Doppler. Additional Comments: A device lead is visualized in the right ventricle.  LEFT VENTRICLE PLAX 2D LVIDd:         3.70 cm  Diastology LVIDs:         2.50 cm  LV e' medial:    8.38 cm/s LV PW:         1.00 cm  LV E/e' medial:  13.6 LV IVS:        1.10 cm  LV e' lateral:   8.05 cm/s LVOT diam:     2.00 cm  LV E/e' lateral: 14.2 LV SV:         47 LV SV Index:   23 LVOT Area:     3.14 cm  RIGHT VENTRICLE RV S prime:     7.72 cm/s TAPSE (M-mode):  1.4 cm LEFT ATRIUM             Index       RIGHT ATRIUM           Index LA diam:        4.60 cm 2.27 cm/m  RA Area:     20.90 cm LA Vol (A2C):   51.1 ml 25.22 ml/m RA Volume:   52.70 ml  26.01 ml/m LA Vol (A4C):   76.1 ml 37.56 ml/m LA Biplane Vol: 64.6 ml 31.88 ml/m  AORTIC VALVE LVOT Vmax:   78.40 cm/s LVOT Vmean:  52.400 cm/s LVOT VTI:    0.149 m  AORTA Ao Root diam: 3.65 cm Ao Asc diam:  4.20 cm MITRAL VALVE                TRICUSPID VALVE MV Area (PHT): 1.65 cm     TR Peak grad:   22.8 mmHg MV Area VTI:   1.52 cm     TR Vmax:        239.00 cm/s MV Peak grad:  6.2 mmHg MV Mean grad:  3.0 mmHg     SHUNTS MV Vmax:       1.24 m/s      Systemic VTI:  0.15 m MV Vmean:      80.2 cm/s    Systemic Diam: 2.00 cm MV Decel Time: 461 msec MV E velocity: 114.00 cm/s Dani Gobble Croitoru MD Electronically signed by Sanda Klein MD Signature Date/Time: 12/03/2020/12:18:05 PM    Final    CUP PACEART REMOTE DEVICE CHECK  Result Date: 12/02/2020 Scheduled remote reviewed. Normal device function.  Known AF, programmed VVIR. Next remote in 91 days. Kathy Breach, RN, CCDS, [employer] last programmed on Oct 20, 2020. Beeper is OFF.      Subjective: pt doing well today.  She reports resolution of chest pain without recurrence today.  No other acute complaints. Wants to go home.    Discharge Exam: Vitals:   12/03/20 1100 12/03/20 1130  BP: 104/77 118/83  Pulse: 70 70  Resp: (!) 27 18  Temp:    SpO2: 97% 100%   Vitals:   12/03/20 1000 12/03/20 1030 12/03/20 1100 12/03/20 1130  BP: 97/77 113/88 104/77 118/83  Pulse: 70 70 70 70  Resp: 13 (!) 24 (!) 27 18  Temp:      TempSrc:      SpO2: 95% 94% 97% 100%  Weight:      Height:        General: Pt is alert, awake, not in acute distress Cardiovascular: RRR, S1/S2 +, no rubs, no gallops Respiratory: CTA bilaterally, no wheezing, no rhonchi Abdominal: Soft, NT, ND, bowel sounds + Extremities: no edema, no cyanosis    The results of significant diagnostics from this hospitalization (including imaging, microbiology, ancillary and laboratory) are listed below for reference.     Microbiology: Recent Results (from the past 240 hour(s))  SARS CORONAVIRUS 2 (TAT 6-24 HRS) Nasopharyngeal Nasopharyngeal Swab     Status: None   Collection Time: 12/03/20 12:15 AM   Specimen: Nasopharyngeal Swab  Result Value Ref Range Status   SARS Coronavirus 2 NEGATIVE NEGATIVE Final    Comment: (NOTE) SARS-CoV-2 target nucleic acids are NOT DETECTED.  The SARS-CoV-2 RNA is generally detectable in upper and lower respiratory specimens during the acute phase of infection.  Negative results do not preclude SARS-CoV-2 infection, do not rule out co-infections with other pathogens, and should not be  used as the sole basis for treatment or other patient management decisions. Negative results must be combined with clinical observations, patient history, and epidemiological information. The expected result is Negative.  Fact Sheet for Patients: SugarRoll.be  Fact Sheet for Healthcare Providers: https://www.woods-mathews.com/  This test is not yet approved or cleared by the [place] FDA and  has been authorized for detection and/or diagnosis of SARS-CoV-2 by FDA under an Emergency Use Authorization (EUA). This EUA will remain  in effect (meaning this test can be used) for the duration of the COVID-19 declaration under Se ction 564(b)(1) of the Act, 21 U.S.C. section 360bbb-3(b)(1), unless the authorization is terminated or revoked sooner.  Performed at [facility], [place] [address], [place], [place] [address]      Labs: BNP (last 3 results) No results for input(s): BNP in the last 8760 hours. Basic Metabolic Panel: Recent Labs  Lab 12/02/20 1652  NA 131*  K 4.2  CL 95*  CO2 24  GLUCOSE 83  BUN 60*  CREATININE 2.85*  CALCIUM 9.7   Liver Function Tests: No results for input(s): AST, ALT, ALKPHOS, BILITOT, PROT, ALBUMIN in the last 168 hours. No results for input(s): LIPASE, AMYLASE in the last 168 hours. No results for input(s): AMMONIA in the last 168 hours. CBC: Recent Labs  Lab 12/02/20 1652  WBC 7.3  NEUTROABS 3.6  HGB 12.2  HCT 38.7  MCV 75.1*  PLT 305   Cardiac Enzymes: No results for input(s): CKTOTAL, CKMB, CKMBINDEX, TROPONINI in the last 168 hours. BNP: Invalid input(s): POCBNP CBG: No results for input(s): GLUCAP in the last 168 hours. D-Dimer No results for input(s): DDIMER in the last 72 hours. Hgb A1c No results for input(s): HGBA1C in the last 72 hours. Lipid  Profile No results for input(s): CHOL, HDL, LDLCALC, TRIG, CHOLHDL, LDLDIRECT in the last 72 hours. Thyroid function studies No results for input(s): TSH, T4TOTAL, T3FREE, THYROIDAB in the last 72 hours.  Invalid input(s): FREET3 Anemia work up No results for input(s): VITAMINB12, FOLATE, FERRITIN, TIBC, IRON, RETICCTPCT in the last 72 hours. Urinalysis    Component Value Date/Time   COLORURINE STRAW (A) 12/02/2020 2216   APPEARANCEUR CLEAR 12/02/2020 2216   LABSPEC 1.008 12/02/2020 2216   PHURINE 6.0 12/02/2020 2216   GLUCOSEU NEGATIVE 12/02/2020 2216   [place] 12/02/2020 2216   [place] 12/02/2020 2216   [place] 12/02/2020 2216   PROTEINUR NEGATIVE 12/02/2020 2216   NITRITE NEGATIVE 12/02/2020 2216   LEUKOCYTESUR NEGATIVE 12/02/2020 2216   Sepsis Labs Invalid input(s): PROCALCITONIN,  WBC,  LACTICIDVEN Microbiology Recent Results (from the past 240 hour(s))  SARS CORONAVIRUS 2 (TAT 6-24 HRS) Nasopharyngeal Nasopharyngeal Swab     Status: None   Collection Time: 12/03/20 12:15 AM   Specimen: Nasopharyngeal Swab  Result Value Ref Range Status   SARS Coronavirus 2 NEGATIVE NEGATIVE Final    Comment: (NOTE) SARS-CoV-2 target nucleic acids are NOT DETECTED.  The SARS-CoV-2 RNA is generally detectable in upper and lower respiratory specimens during the acute phase of infection. Negative results do not preclude SARS-CoV-2 infection, do not rule out co-infections with other pathogens, and should not be used as the sole basis for treatment or other patient management decisions. Negative results must be combined with clinical observations, patient history, and epidemiological information. The expected result is Negative.  Fact Sheet for Patients: SugarRoll.be  Fact Sheet for Healthcare Providers: https://www.woods-mathews.com/  This test is not yet approved or cleared by the [place]  FDA and  has  been authorized for detection and/or diagnosis of SARS-CoV-2 by FDA under an Emergency Use Authorization (EUA). This EUA will remain  in effect (meaning this test can be used) for the duration of the COVID-19 declaration under Se ction 564(b)(1) of the Act, 21 U.S.C. section 360bbb-3(b)(1), unless the authorization is terminated or revoked sooner.  Performed at [facility], [place] [address], [place], [place] [address]      Time coordinating discharge: Over 30 minutes  SIGNED:   Ezekiel Slocumb, DO [facility] 12/03/2020, 1:18 PM   If 7PM-7AM, please contact night-coverage [email or web address]

## 2020-12-03 NOTE — ED Notes (Signed)
Received verbal report from Paden B RN at this time 

## 2020-12-03 NOTE — Discharge Instructions (Addendum)
Your echocardiogram results looked great.  Your heart is pumping strong and there were no signs of any blockages seen.  The episode of chest pain you had was non-cardiac in nature.  The cardiology team does not feel you need to have any stress testing done at this time. Please follow up with your primary cardiologist.

## 2020-12-03 NOTE — ED Notes (Signed)
Provider at bedside

## 2020-12-04 ENCOUNTER — Other Ambulatory Visit: Payer: Self-pay | Admitting: Internal Medicine

## 2020-12-05 NOTE — Telephone Encounter (Signed)
last RF 12/03/20 

## 2020-12-06 ENCOUNTER — Telehealth (HOSPITAL_BASED_OUTPATIENT_CLINIC_OR_DEPARTMENT_OTHER): Payer: Self-pay | Admitting: Cardiology

## 2020-12-06 NOTE — Telephone Encounter (Signed)
Spoke with patient regarding post hospital visit scheduled Tuesday 01/18/21 at 11:20 am with Dr. Gilford Raid time is 11:00 am at [facility], [address]----patient voiced her understanding.

## 2020-12-13 ENCOUNTER — Other Ambulatory Visit: Payer: Self-pay | Admitting: Internal Medicine

## 2020-12-13 NOTE — Telephone Encounter (Signed)
Notes to clinic: Patient has appointment tomorrow Review for refill    Requested Prescriptions  Pending Prescriptions Disp Refills   torsemide (DEMADEX) 20 MG tablet [Pharmacy Med Name: Torsemide Oral Tablet 20 MG] 90 tablet 0    Sig: TAKE TWO TABLETS BY MOUTH IN THE MORNING and one tablet in the evening      Cardiovascular:  Diuretics - Loop Failed - 12/13/2020 11:41 AM      Failed - Na in normal range and within 360 days    Sodium  Date Value Ref Range Status  12/02/2020 131 (L) 135 - 145 mmol/L Final  08/24/2016 140 134 - 144 mmol/L Final          Failed - Cr in normal range and within 360 days    Creatinine  Date Value Ref Range Status  07/29/2020 2.13 (H) 0.44 - 1.00 mg/dL Final   Creatinine, Ser  Date Value Ref Range Status  12/02/2020 2.85 (H) 0.44 - 1.00 mg/dL Final          Failed - Last BP in normal range    BP Readings from Last 1 Encounters:  12/03/20 (!) 113/94          Passed - K in normal range and within 360 days    Potassium  Date Value Ref Range Status  12/02/2020 4.2 3.5 - 5.1 mmol/L Final          Passed - Ca in normal range and within 360 days    Calcium  Date Value Ref Range Status  12/02/2020 9.7 8.9 - 10.3 mg/dL Final          Passed - Valid encounter within last 6 months    Recent Outpatient Visits           3 months ago Essential hypertension   [place], Deborah B, MD   7 months ago Establishing care with new doctor, encounter for   [place], MD       Future Appointments             Tomorrow Ladell Pier, MD [place]   In 1 month Buford Dresser, MD [facility]               colchicine 0.6 MG tablet [Pharmacy Med Name: Colchicine Oral Tablet 0.6 MG] 14 tablet 0    Sig: TAKE ONE TABLET BY MOUTH TWICE DAILY      Endocrinology:  Gout Agents Failed -  12/13/2020 11:41 AM      Failed - Uric Acid in normal range and within 360 days    Uric Acid, Serum  Date Value Ref Range Status  08/11/2016 13.3 (H) 2.3 - 6.6 mg/dL Final          Failed - Cr in normal range and within 360 days    Creatinine  Date Value Ref Range Status  07/29/2020 2.13 (H) 0.44 - 1.00 mg/dL Final   Creatinine, Ser  Date Value Ref Range Status  12/02/2020 2.85 (H) 0.44 - 1.00 mg/dL Final          Passed - Valid encounter within last 12 months    Recent Outpatient Visits           3 months ago Essential hypertension   [place], Deborah B, MD   7 months ago Establishing care with new doctor, encounter  for   [place], MD       Future Appointments             Tomorrow Ladell Pier, MD [place]   In 1 month Buford Dresser, MD [facility]

## 2020-12-13 NOTE — Telephone Encounter (Signed)
  Notes to clinic:  Patient has appt tomorrow   Requested Prescriptions  Pending Prescriptions Disp Refills   levothyroxine (SYNTHROID) 25 MCG tablet [Pharmacy Med Name: Levothyroxine Sodium Oral Tablet 25 MCG] 30 tablet 0    Sig: TAKE ONE TABLET BY MOUTH ONE TIME DAILY 30 to 60 minutes before breakfast on an empty stomach with a full glass of water      Endocrinology:  Hypothyroid Agents Failed - 12/13/2020  9:36 AM      Failed - TSH needs to be rechecked within 3 months after an abnormal result. Refill until TSH is due.      Passed - TSH in normal range and within 360 days    TSH  Date Value Ref Range Status  05/03/2020 2.940 0.450 - 4.500 uIU/mL Final          Passed - Valid encounter within last 12 months    Recent Outpatient Visits           3 months ago Essential hypertension   [place], MD   7 months ago Establishing care with new doctor, encounter for   [place], MD       Future Appointments             Tomorrow Ladell Pier, MD [place]   In 1 month Buford Dresser, MD [facility]

## 2020-12-14 ENCOUNTER — Ambulatory Visit: Payer: Medicare Other | Attending: Internal Medicine | Admitting: Internal Medicine

## 2020-12-14 ENCOUNTER — Other Ambulatory Visit: Payer: Self-pay

## 2020-12-14 ENCOUNTER — Encounter: Payer: Self-pay | Admitting: Internal Medicine

## 2020-12-14 VITALS — BP 130/83 | HR 69 | Resp 16 | Ht 68.0 in | Wt 197.2 lb

## 2020-12-14 DIAGNOSIS — E890 Postprocedural hypothyroidism: Secondary | ICD-10-CM | POA: Insufficient documentation

## 2020-12-14 DIAGNOSIS — I4821 Permanent atrial fibrillation: Secondary | ICD-10-CM | POA: Insufficient documentation

## 2020-12-14 DIAGNOSIS — I10 Essential (primary) hypertension: Secondary | ICD-10-CM

## 2020-12-14 DIAGNOSIS — N184 Chronic kidney disease, stage 4 (severe): Secondary | ICD-10-CM

## 2020-12-14 DIAGNOSIS — Z87891 Personal history of nicotine dependence: Secondary | ICD-10-CM | POA: Diagnosis not present

## 2020-12-14 DIAGNOSIS — I130 Hypertensive heart and chronic kidney disease with heart failure and stage 1 through stage 4 chronic kidney disease, or unspecified chronic kidney disease: Secondary | ICD-10-CM | POA: Diagnosis not present

## 2020-12-14 DIAGNOSIS — I5032 Chronic diastolic (congestive) heart failure: Secondary | ICD-10-CM | POA: Diagnosis not present

## 2020-12-14 DIAGNOSIS — I5042 Chronic combined systolic (congestive) and diastolic (congestive) heart failure: Secondary | ICD-10-CM | POA: Insufficient documentation

## 2020-12-14 DIAGNOSIS — Z79899 Other long term (current) drug therapy: Secondary | ICD-10-CM | POA: Insufficient documentation

## 2020-12-14 DIAGNOSIS — Z7901 Long term (current) use of anticoagulants: Secondary | ICD-10-CM | POA: Insufficient documentation

## 2020-12-14 DIAGNOSIS — Z7189 Other specified counseling: Secondary | ICD-10-CM

## 2020-12-14 DIAGNOSIS — I25718 Atherosclerosis of autologous vein coronary artery bypass graft(s) with other forms of angina pectoris: Secondary | ICD-10-CM | POA: Diagnosis not present

## 2020-12-14 DIAGNOSIS — Z23 Encounter for immunization: Secondary | ICD-10-CM | POA: Diagnosis not present

## 2020-12-14 DIAGNOSIS — M130 Polyarthritis, unspecified: Secondary | ICD-10-CM | POA: Diagnosis not present

## 2020-12-14 DIAGNOSIS — I25118 Atherosclerotic heart disease of native coronary artery with other forms of angina pectoris: Secondary | ICD-10-CM | POA: Diagnosis not present

## 2020-12-14 DIAGNOSIS — M109 Gout, unspecified: Secondary | ICD-10-CM | POA: Diagnosis not present

## 2020-12-14 DIAGNOSIS — Z8249 Family history of ischemic heart disease and other diseases of the circulatory system: Secondary | ICD-10-CM | POA: Diagnosis not present

## 2020-12-14 DIAGNOSIS — I482 Chronic atrial fibrillation, unspecified: Secondary | ICD-10-CM

## 2020-12-14 MED ORDER — COLCHICINE 0.6 MG PO TABS: 0.6000 mg | ORAL_TABLET | Freq: Every day | ORAL | 3 refills | Status: DC

## 2020-12-14 MED ORDER — WARFARIN SODIUM 5 MG PO TABS: 5.0000 mg | ORAL_TABLET | Freq: Every evening | ORAL | 1 refills | Status: DC

## 2020-12-14 MED ORDER — METOPROLOL SUCCINATE ER 25 MG PO TB24: 25.0000 mg | ORAL_TABLET | Freq: Every day | ORAL | 3 refills | Status: DC

## 2020-12-14 MED ORDER — LEVOTHYROXINE SODIUM 25 MCG PO TABS: ORAL_TABLET | ORAL | 2 refills | Status: DC

## 2020-12-14 MED ORDER — TORSEMIDE 20 MG PO TABS: ORAL_TABLET | ORAL | 3 refills | Status: DC

## 2020-12-14 NOTE — Progress Notes (Signed)
Patient ID: Jacqueline Palmer, female    DOB: Jul 10, 1945  MRN: [id number]  CC: Hypertension   Subjective: Jacqueline Palmer is a 75 y.o. female who presents for chronic ds management Her concerns today include:  Patient with history of CAD with history CABGx4, chronic diastolic CHF/NICM with BV-ICD, HTN, rheumatic heart disease status post MVR replacement, permanent A. 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  Since last visit with me, patient was hospitalized for 2 days on the 14th of this month with chest pain.  Work-up determined that it was not cardiac in nature.  She was also found to have acute on chronic CKD likely due to dehydration.  BUN/creatinine previously was 27/2.13 but had increased to 60/2.85.  She is agreeable today to having kidney function recheck with chemistry.  No changes were made in her medications.  CAD/dCHF/a.fib/HTN Currently taking: see medication list.  She is on warfarin, torsemide, spironolactone, pravastatin, metoprolol, hydralazine. Med Adherence: _0  Yes    _1  No Medication side effects: _2  Yes    _3  No Adherence with salt restriction: _4  Yes    _5  No Home Monitoring?: _6  Yes    _7  No Monitoring Frequency: _8  Yes    _9  No Home BP results range: _10  Yes    _11  No SOB? _12  Yes    _13  No Chest Pain?: _14  Yes    _15  No Leg swelling?: _16  Yes    _17  No Headaches?: _18  Yes    _19  No Dizziness? _20  Yes    _21  No Comments: Denies any palpitations.  She has not had any bruising or bleeding on warfarin.  Levels are being checked through cardiology clinic.  Requests refill on warfarin.   Shoulder pain: still doing OT 2x/wk and P.T 3xwk.  Shoulders are fine now Dx with OA of RT hip. P.T very helpful. On Tylenol and Gabapentin BID Gabapentin rxn for nerve pain She is needing refills on several of her medications  including colchicine for gout.  She has not had any recent gout attacks.  She has not had any falls.  She ambulates with a cane.  Thyroid: compliant with Levothyroxine.  No hair loss or major wgh changes  She is requesting information about advanced directive.  She currently does not have 1 but wants to put it in place.  Patient Active Problem List   Diagnosis Date Noted   Acute-on-chronic kidney injury ([place]) 12/03/2020   Subtherapeutic international normalized ratio (INR) 12/03/2020   Chest pain 12/02/2020   Neuropathic pain 11/25/2020   Seizures ([place]) 06/16/2020   Over weight 05/03/2020   Nonischemic cardiomyopathy ([place]) 03/04/2020   Biventricular automatic implantable cardioverter defibrillator in situ 02/19/2020   Chronic diastolic heart failure ([place]) 02/19/2020   Hx of CABG 02/19/2020   Coronary artery disease involving native coronary artery of native heart without angina pectoris 02/19/2020   Essential hypertension 02/19/2020   Permanent atrial fibrillation ([place]) 02/19/2020   CKD (chronic kidney disease) stage 4, GFR 15-29 ml/min ([place]) 02/19/2020   Renal cell carcinoma of right kidney ([place]) 02/12/2020   Brain metastasis ([place]) 02/12/2020   Gout attack 08/14/2016   Chronic a-fib ([place]) 08/14/2016   Chronic anticoagulation 08/14/2016   Acute on chronic systolic heart failure ([place]) 08/09/2016   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     Current Outpatient Medications on File Prior to Visit  Medication Sig  Dispense Refill   acetaminophen (TYLENOL) 500 MG tablet Take 1,000 mg by mouth in the morning and at bedtime.     colchicine 0.6 MG tablet Take 1 tablet (0.6 mg total) by mouth at bedtime.     gabapentin (NEURONTIN) 300 MG capsule Take 1 capsule (300 mg total) by mouth 2 (two) times daily. 60 capsule 3   hydrALAZINE (APRESOLINE) 10 MG tablet TAKE ONE TABLET BY MOUTH THREE TIMES DAILY 270 tablet 1   levETIRAcetam (KEPPRA) 500 MG tablet Take 1 tablet (500 mg total) by mouth 2 (two)  times daily. (Patient not taking: Reported on 12/02/2020) 60 tablet 0   levETIRAcetam (KEPPRA) 500 MG tablet One po bid 180 tablet 4   levothyroxine (SYNTHROID) 25 MCG tablet TAKE ONE TABLET BY MOUTH ONE TIME DAILY 30 to 60 minutes before breakfast on an empty stomach with a full glass of water 30 tablet 0   metoprolol succinate (TOPROL-XL) 25 MG 24 hr tablet TAKE ONE TABLET BY MOUTH ONCE DAILY 90 tablet 1   nitroGLYCERIN (NITROSTAT) 0.4 MG SL tablet Place 1 tablet (0.4 mg total) under the tongue every 5 (five) minutes as needed for chest pain. 25 tablet 3   polyethylene glycol (MIRALAX / GLYCOLAX) 17 g packet Take 17 g by mouth daily as needed for mild constipation.     pravastatin (PRAVACHOL) 20 MG tablet Take 20 mg by mouth at bedtime.     spironolactone (ALDACTONE) 25 MG tablet Take 1 tablet (25 mg total) by mouth daily. 90 tablet 3   torsemide (DEMADEX) 20 MG tablet TAKE TWO TABLETS BY MOUTH IN THE MORNING AND ONE IN THE EVENING 90 tablet 0   warfarin (COUMADIN) 5 MG tablet Take 1 tablet (5 mg total) by mouth every evening.     No current facility-administered medications on file prior to visit.    Allergies  Allergen Reactions   Avocado Shortness Of Breath    Before 2007   Banana Anaphylaxis and Swelling   Plantain Anaphylaxis and Swelling   Ace Inhibitors Cough   Codeine Other (See Comments)    Nausea and Feels Jittery   Prednisone Swelling    Of face and lower extremities   Tape Hives    Tape  Does better with paper tape  Tape  Does better with paper tape     Latex Rash    Social History   Socioeconomic History   Marital status: Widowed    Spouse name: Not on file   Number of children: 0   Years of education: Doctorate   Highest education level: Master's degree (e.g., MA, MS, MEng, MEd, MSW, MBA)  Occupational History   Occupation: Retired Clinical biochemist  Tobacco Use   Smoking status: Former    Types: Cigarettes    Quit date: 07/30/2014    Years since quitting: 6.3    Smokeless tobacco: Never  Vaping Use   Vaping Use: Never used  Substance and Sexual Activity   Alcohol use: Yes    Comment: 1 glass wine twice weekly   Drug use: No   Sexual activity: Not on file  Other Topics Concern   Not on file  Social History Narrative   Right handed   Coffee daily/tea qod   Social Determinants of Health   Financial Resource Strain: Not on file  Food Insecurity: Not on file  Transportation Needs: Not on file  Physical Activity: Not on file  Stress: Not on file  Social Connections: Not on file  Intimate Partner  Violence: Not on file    Family History  Problem Relation Age of Onset   Heart attack Mother    Heart attack Father    Heart attack Sister    Breast cancer Neg Hx     Past Surgical History:  Procedure Laterality Date   ABDOMINAL HYSTERECTOMY     CORONARY ARTERY BYPASS GRAFT     CRANIOTOMY Right 07/30/2019   Right frontal   NEPHRECTOMY Right 10/16/2019    ROS: Review of Systems Negative except as stated above  PHYSICAL EXAM: BP 130/83   Pulse 69   Resp 16   Ht _0  (1.727 m)   Wt 197 lb 3.2 oz (89.4 kg)   SpO2 100%   BMI 29.98 kg/m   Wt Readings from Last 3 Encounters:  12/14/20 197 lb 3.2 oz (89.4 kg)  12/03/20 196 lb (88.9 kg)  10/05/20 192 lb 8 oz (87.3 kg)    Physical Exam  General appearance - alert, well appearing, older African-American female and in no distress Mental status - normal mood, behavior, speech, dress, motor activity, and thought processes Chest - clear to auscultation, no wheezes, rales or rhonchi, symmetric air entry Heart - normal rate, regular rhythm, normal S1, S2, no murmurs, rubs, clicks or gallops Musculoskeletal -gait is slow with low foot to floor clearance.  She ambulates with a cane.  She requires mild assistance in getting on the exam table.   Extremities -trace lower extremity edema on the right side.   CMP Latest Ref Rng & Units 12/02/2020 07/29/2020 04/08/2020  Glucose 70 - 99 mg/dL  83 77 74  BUN 8 - 23 mg/dL 60(H) 27(H) 27(H)  Creatinine 0.44 - 1.00 mg/dL 2.85(H) 2.13(H) 2.09(H)  Sodium 135 - 145 mmol/L 131(L) 138 141  Potassium 3.5 - 5.1 mmol/L 4.2 4.0 4.0  Chloride 98 - 111 mmol/L 95(L) 104 106  CO2 22 - 32 mmol/L _1 Calcium 8.9 - 10.3 mg/dL 9.7 9.6 9.3  Total Protein 6.5 - 8.1 g/dL - 7.8 7.1  Total Bilirubin 0.3 - 1.2 mg/dL - 0.6 0.9  Alkaline Phos 38 - 126 U/L - 96 88  AST 15 - 41 U/L - 16 21  ALT 0 - 44 U/L - 11 15   Lipid Panel  No results found for: CHOL, TRIG, HDL, CHOLHDL, VLDL, LDLCALC, LDLDIRECT  CBC    Component Value Date/Time   WBC 7.3 12/02/2020 1652   RBC 5.15 (H) 12/02/2020 1652   HGB 12.2 12/02/2020 1652   HGB 11.2 (L) 07/29/2020 1054   HCT 38.7 12/02/2020 1652   PLT 305 12/02/2020 1652   PLT 348 07/29/2020 1054   MCV 75.1 (L) 12/02/2020 1652   MCH 23.7 (L) 12/02/2020 1652   MCHC 31.5 12/02/2020 1652   RDW 14.9 12/02/2020 1652   LYMPHSABS 2.7 12/02/2020 1652   MONOABS 0.8 12/02/2020 1652   EOSABS 0.2 12/02/2020 1652   BASOSABS 0.0 12/02/2020 1652    ASSESSMENT AND PLAN:  1. Essential hypertension Close to goal.  Continue current medications and low-salt diet. - Basic Metabolic Panel  2. Chronic diastolic heart failure (HCC) Stable and compensated.  Continue current medications including torsemide - torsemide (DEMADEX) 20 MG tablet; TAKE TWO TABLETS BY MOUTH IN THE MORNING AND ONE IN THE EVENING  Dispense: 90 tablet; Refill: 3  3. Coronary artery disease of autologous vein bypass graft with stable angina pectoris (HCC) Stable.  Continue beta-blocker and statin therapy. - metoprolol succinate (TOPROL-XL) 25 MG 24  hr tablet; Take 1 tablet (25 mg total) by mouth daily.  Dispense: 90 tablet; Refill: 3  4. Polyarthritis She continues to do OT and PT at her living facility.  She is finding it beneficial and would like to continue.  I think it is reasonable.  Gait training and safety would be important.  5. Postablative  hypothyroidism - TSH - levothyroxine (SYNTHROID) 25 MCG tablet; TAKE ONE TABLET BY MOUTH ONE TIME DAILY 30 to 60 minutes before breakfast on an empty stomach with a full glass of water  Dispense: 90 tablet; Refill: 2  6. Chronic atrial fibrillation (HCC) - warfarin (COUMADIN) 5 MG tablet; Take 1 tablet (5 mg total) by mouth every evening.  Dispense: 90 tablet; Refill: 1  7. CKD (chronic kidney disease) stage 4, GFR 15-29 ml/min (HCC) Plan to recheck chemistry today to see if her BUN and creatinine have returned to baseline.  8. Advance directive discussed with patient Discussed advanced directive with patient including living will and healthcare power of attorney.  I provided her with our packet that explains in a little bit more detail and also has forms which she can execute.  Advised that she provide [name] a copy should she decide to execute a living will and/or healthcare power of attorney.  9. Need for Tdap vaccination - Tdap vaccine greater than or equal to 7yo IM She get the shingles vaccine first shot on her next visit.  Patient was given the opportunity to ask questions.  Patient verbalized understanding of the plan and was able to repeat key elements of the plan.   No orders of the defined types were placed in this encounter.    Requested Prescriptions    No prescriptions requested or ordered in this encounter    No follow-ups on file.  Karle Plumber, MD, FACP

## 2020-12-15 LAB — BASIC METABOLIC PANEL
BUN/Creatinine Ratio: 18 (ref 12–28)
BUN: 45 mg/dL — ABNORMAL HIGH (ref 8–27)
CO2: 22 mmol/L (ref 20–29)
Calcium: 9.5 mg/dL (ref 8.7–10.3)
Chloride: 103 mmol/L (ref 96–106)
Creatinine, Ser: 2.52 mg/dL — ABNORMAL HIGH (ref 0.57–1.00)
Glucose: 80 mg/dL (ref 65–99)
Potassium: 4.2 mmol/L (ref 3.5–5.2)
Sodium: 141 mmol/L (ref 134–144)
eGFR: 19 mL/min/{1.73_m2} — ABNORMAL LOW (ref >59)

## 2020-12-15 LAB — TSH: TSH: 3.38 u[IU]/mL (ref 0.450–4.500)

## 2020-12-21 ENCOUNTER — Other Ambulatory Visit: Payer: Self-pay

## 2020-12-21 ENCOUNTER — Ambulatory Visit (INDEPENDENT_AMBULATORY_CARE_PROVIDER_SITE_OTHER): Payer: Medicare Other | Admitting: Pharmacist

## 2020-12-21 DIAGNOSIS — Z7901 Long term (current) use of anticoagulants: Secondary | ICD-10-CM

## 2020-12-21 DIAGNOSIS — I482 Chronic atrial fibrillation, unspecified: Secondary | ICD-10-CM

## 2020-12-21 LAB — POCT INR: INR: 2.4 (ref 2.0–3.0)

## 2020-12-21 NOTE — Patient Instructions (Addendum)
Description   Continue with 1 tablet daily except 1.5 tablets each Monday and Friday.  Repeat INR in 5 weeks

## 2020-12-25 NOTE — Progress Notes (Signed)
Remote ICD transmission.   

## 2021-01-18 ENCOUNTER — Other Ambulatory Visit: Payer: Self-pay

## 2021-01-18 ENCOUNTER — Encounter (HOSPITAL_BASED_OUTPATIENT_CLINIC_OR_DEPARTMENT_OTHER): Payer: Self-pay | Admitting: Cardiology

## 2021-01-18 ENCOUNTER — Ambulatory Visit (INDEPENDENT_AMBULATORY_CARE_PROVIDER_SITE_OTHER): Payer: Medicare Other | Admitting: Cardiology

## 2021-01-18 ENCOUNTER — Ambulatory Visit (HOSPITAL_BASED_OUTPATIENT_CLINIC_OR_DEPARTMENT_OTHER): Payer: Medicare Other | Admitting: Cardiology

## 2021-01-18 VITALS — BP 100/68 | HR 70 | Ht 68.0 in | Wt 193.0 lb

## 2021-01-18 DIAGNOSIS — Z952 Presence of prosthetic heart valve: Secondary | ICD-10-CM

## 2021-01-18 DIAGNOSIS — I4821 Permanent atrial fibrillation: Secondary | ICD-10-CM

## 2021-01-18 DIAGNOSIS — I5032 Chronic diastolic (congestive) heart failure: Secondary | ICD-10-CM

## 2021-01-18 DIAGNOSIS — I2510 Atherosclerotic heart disease of native coronary artery without angina pectoris: Secondary | ICD-10-CM

## 2021-01-18 DIAGNOSIS — C641 Malignant neoplasm of right kidney, except renal pelvis: Secondary | ICD-10-CM

## 2021-01-18 DIAGNOSIS — Z95810 Presence of automatic (implantable) cardiac defibrillator: Secondary | ICD-10-CM

## 2021-01-18 DIAGNOSIS — Z951 Presence of aortocoronary bypass graft: Secondary | ICD-10-CM

## 2021-01-18 DIAGNOSIS — I712 Thoracic aortic aneurysm, without rupture, unspecified: Secondary | ICD-10-CM

## 2021-01-18 NOTE — Patient Instructions (Signed)
Medication Instructions:  The current medical regimen is effective;  continue present plan and medications as directed. Please refer to the Current Medication list given to you today.   *If you need a refill on your cardiac medications before your next appointment, please call your pharmacy*  Lab Work:   Testing/Procedures:  NONE    NONE  Follow-Up: Your next appointment:  6 month(s) In Person with Bridgette Christopher, MD  At [facility], you and your health needs are our priority.  As part of our continuing mission to provide you with exceptional heart care, we have created designated Provider Care Teams.  These Care Teams include your primary Cardiologist (physician) and Advanced Practice Providers (APPs -  Physician Assistants and Nurse Practitioners) who all work together to provide you with the care you need, when you need it.   

## 2021-01-18 NOTE — Progress Notes (Signed)
Cardiology Office Note:    Date:  01/18/2021   ID:  Jacqueline Palmer, DOB 04-21-1946, MRN [id number]  PCP:  Ladell Pier, MD  Cardiologist:  Buford Dresser, MD  Referring MD: Ladell Pier, MD   CC: follow up  History of Present Illness:    Jacqueline Palmer  [name] is a 75 y.o. female with a hx of hypertension, CAD s/p 4V CABG, MAZE, LAA ligation, bioprosthetic MVR (for rheumatic heart disease) 11/17/2015, permanent atrial fibrillation, chronic diastolic heart failure with recovered systolic dysfunction, hypothyroidism, CKD stage 4, metastatic renal cancer s/p radical nephrectomy and craniotomy for resection of brain tumor who is seen as a follow up today. I initially met her 02/19/20 new consult at the request of Ladell Pier, MD for the evaluation and management of multiple cardiovascular conditions.  CV history: -Previously followed by Dr. Westley Gambles at [facility] for BiV-ICD. She is 100% BiV paced. Had one episode of sustained VT terminated by ATP. Has permanent atrial fibrillation, on coumadin. Appears she had AV node ablation 09/11/2016 at [place] based on [place]. -Admitted for HF at [facility] 10/2019, 11/2019.  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 Follows with Dr. Alen Blew in medical oncology and Dr. Mickeal Skinner in neuro oncology. Follows at [facility]  Today: Discharged from the hospital 12/03/20. Reviewed her workup and evaluation. hsTn unremarkable x3, echo with preserved EF.  Working with PT, has been working on psoas pain with improvement. Gabapentin was not helping, stopped about 2 weeks ago.  No further chest pain.   Denies shortness of breath at rest or with normal exertion. No PND, orthopnea, LE edema or unexpected weight gain. No syncope or palpitations.    Past Medical History:  Diagnosis Date   Acute kidney injury [facility])    Atrial fibrillation ([place])    Cardiomyopathy ([place])    CHF (congestive heart failure)  ([place])    Chronic a-fib ([place]) 08/14/2016   Chronic anticoagulation 08/14/2016   Coronary artery disease    4v CABG 6/17   Hypertension    MVR 79m Edwards Perimount Plus Bioprosthetic 11/17/2015   SN [id number] [From patient's surgical info card]   Thrombus 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 myocardial infarction    Thyroid disease     Past Surgical History:  Procedure Laterality Date   ABDOMINAL HYSTERECTOMY     CORONARY ARTERY BYPASS GRAFT     CRANIOTOMY Right 07/30/2019   Right frontal   NEPHRECTOMY Right 10/16/2019    Current Medications: Current Outpatient Medications on File Prior to Visit  Medication Sig   acetaminophen (TYLENOL) 500 MG tablet Take 1,000 mg by mouth in the morning and at bedtime.   colchicine 0.6 MG tablet Take 1 tablet (0.6 mg total) by mouth at bedtime.   hydrALAZINE (APRESOLINE) 10 MG tablet TAKE ONE TABLET BY MOUTH THREE TIMES DAILY   levETIRAcetam (KEPPRA) 500 MG tablet Take 1 tablet (500 mg total) by mouth 2 (two) times daily.   levothyroxine (SYNTHROID) 25 MCG tablet TAKE ONE TABLET BY MOUTH ONE TIME DAILY 30 to 60 minutes before breakfast on an empty stomach with a full glass of water   metoprolol succinate (TOPROL-XL) 25 MG 24 hr tablet Take 1 tablet (25 mg total) by mouth daily.   nitroGLYCERIN (NITROSTAT) 0.4 MG SL tablet Place 1 tablet (0.4 mg total) under the tongue every 5 (five) minutes as needed for chest pain.   polyethylene glycol (MIRALAX / GLYCOLAX) 17 g packet  Take 17 g by mouth daily as needed for mild constipation.   pravastatin (PRAVACHOL) 20 MG tablet Take 20 mg by mouth at bedtime.   spironolactone (ALDACTONE) 25 MG tablet Take 1 tablet (25 mg total) by mouth daily.   torsemide (DEMADEX) 20 MG tablet TAKE TWO TABLETS BY MOUTH IN THE MORNING AND ONE IN THE EVENING   warfarin (COUMADIN) 5 MG tablet Take 1 tablet (5 mg total) by mouth every evening.   No current facility-administered medications on file prior to  visit.     Allergies:   Avocado, Banana, Plantain, Ace inhibitors, Codeine, Prednisone, Tape, and Latex   Social History   Tobacco Use   Smoking status: Former    Types: Cigarettes    Quit date: 07/30/2014    Years since quitting: 6.4   Smokeless tobacco: Never  Vaping Use   Vaping Use: Never used  Substance Use Topics   Alcohol use: Yes    Comment: 1 glass wine twice weekly   Drug use: No    Family History: family history includes Heart attack in her father, mother, and sister. There is no history of Breast cancer.  ROS:   Please see the history of present illness.  Additional pertinent ROS otherwise unremarkable.     EKGs/Labs/Other Studies Reviewed:    The following studies were reviewed today: Echo 12/03/20 1. Left ventricular ejection fraction, by estimation, is 60 to 65%. The  left ventricle has normal function. The left ventricle has no regional  wall motion abnormalities. Left ventricular diastolic function could not  be evaluated.   2. Right ventricular systolic function is normal. The right ventricular  size is not well visualized. There is normal pulmonary artery systolic  pressure. The estimated right ventricular systolic pressure is 29.2 mmHg.   3. Left atrial size was moderately dilated.   4. Right atrial size was mildly dilated.   5. The mitral valve has been repaired/replaced. No evidence of mitral  valve regurgitation. The mean mitral valve gradient is 3.0 mmHg. There is  a present in the mitral position.   6. The aortic valve is tricuspid. Aortic valve regurgitation is trivial.  No aortic stenosis is present.   7. Aortic dilatation noted. There is mild dilatation of the ascending  aorta, measuring 42 mm.   8. The inferior vena cava is normal in size with greater than 50%  respiratory variability, suggesting right atrial pressure of 3 mmHg.   CIED follow up 02/10/20 ([facility]) Stable BSC VVIR CRTD battery (5.24yr) function and lead trends.  BIVpacing=100%  (CHB; PM DEPENDENT).  Two NSVT episodes.  One sustained VT espiode (avg=205bpm) terminated with one burst of ATP.  Permanent AFib.  OAC=warfarin  Echo 11/03/19 ([facility])   NORMAL LEFT VENTRICULAR FUNCTION WITH MODERATE LVH    MILD RV SYSTOLIC DYSFUNCTION (See above)    VALVULAR REGURGITATION: MILD AR, TRIVIAL MR, MILD PR    PROSTHETIC VALVE(S): BIOPROSTHETIC MV     Compared with prior Echo study on 06/27/2019: TR NOT AS WELL VISUALIZED ON    TODAY'S EXAM DUE TO PATIENT DISCOMFORT.    TR REVERSAL STILL NOTED IN THE HEPATIC VEINS, C/W SEVERE TR   TEE 09/01/2016 ([facility])   Limited study to assess for LA/LAA thrombus pre-ablation/DCCV     1. Small amount of laminar thrombus in tip of LAA, smaller than prior TEE    (02/04/16)    2. Definity used demonstrates flow communication into LAA despite previous    ligation attempt and confirms thrombus  in tip of LAA    3. No RA/RAA thrombus    4. Severe TR    EKG:  EKG is personally reviewed.   03/04/20 atypical flutter with biventricular pacing  Recent Labs: 07/29/2020: ALT 11 12/02/2020: Hemoglobin 12.2; Platelets 305 12/14/2020: BUN 45; Creatinine, Ser 2.52; Potassium 4.2; Sodium 141; TSH 3.380  Recent Lipid Panel No results found for: CHOL, TRIG, HDL, CHOLHDL, VLDL, LDLCALC, LDLDIRECT  Physical Exam:    VS:  BP 100/68   Pulse 70   Ht '5\' 8"'  (1.727 m)   Wt 193 lb (87.5 kg)   SpO2 96%   BMI 29.35 kg/m     Wt Readings from Last 3 Encounters:  01/18/21 193 lb (87.5 kg)  12/14/20 197 lb 3.2 oz (89.4 kg)  12/03/20 196 lb (88.9 kg)    GEN: Well nourished, well developed in no acute distress HEENT: Normal, moist mucous membranes NECK: No JVD CARDIAC: regular rhythm, normal S1 and S2, no rubs or gallops. 1/6 systolic murmur. VASCULAR: Radial and DP pulses 2+ bilaterally. No carotid bruits RESPIRATORY:  Clear to auscultation without rales, wheezing or rhonchi  ABDOMEN: Soft, non-tender, non-distended MUSCULOSKELETAL:  Ambulates  independently SKIN: Warm and dry, no edema NEUROLOGIC:  Alert and oriented x 3. No focal neuro deficits noted. PSYCHIATRIC:  Normal affect    ASSESSMENT:    1. Chronic diastolic heart failure ([place])   2. Biventricular automatic implantable cardioverter defibrillator in situ   3. S/P MVR (mitral valve replacement)   4. Hx of CABG   5. Renal cell carcinoma of right kidney ([place])   6. Permanent atrial fibrillation ([place])   7. Thoracic aortic aneurysm without rupture ([facility])     PLAN:    Chronic diastolic heart failure -reports no worsening clinical symptoms today -tolerating torsemide and spironolactone -Given renal disease and current GFR, will not use SGLT2i at this time  Permanent atrial fib/flutter, s/p AV node ablation Sustained VT requiring ATP -CHA2DS2/VAS Stroke Risk Points=5  -had LAA ligation but TEE at [facility] noted prior thrombus and residual flow -on coumadin, established with coumadin clinic here -has BiV-ICD in place, has established with Dr. Quentin Ore  CAD s/p 4V CABG Rheumatic MV disease s/p MVR Hypercholesterolemia, goal LDL <70 Thoracic aortic aneurysm, last 42 mm 11/2020 (echo) -rare angina, has not required NG in >1 month -no aspirin as she is on coumadin -on pravastatin, tolerating -last lipids 04/11/19 at [facility], Thcol 128, LDL 64, HDL 49, TG 77 -on metoprolol succinate as antianginal -has mildly dilated TAA, 42 mm. Monitor.  CKD stage 4, s/p right radical nephrectomy  History of stage 4 renal cell carcinoma with brain metastases Hypertension -last GFR 19, consistent with stage 4 CKD -on spironolactone, torsemide for volume management -on hydralazine for BP control -if needed, could add imdur or change to carvedilol  -no ACEi/ARB given single kidney and chronic kidney disease   Cardiac risk counseling and prevention recommendations: -recommend heart healthy/Mediterranean diet, with whole grains, fruits, vegetable, fish, lean meats, nuts, and olive oil. Limit  salt. -recommend moderate walking, 3-5 times/week for 30-50 minutes each session. Aim for at least 150 minutes.week. Goal should be pace of 3 miles/hours, or walking 1.5 miles in 30 minutes -recommend avoidance of tobacco products. Avoid excess alcohol.  Plan for follow up: 6 mos or sooner PRN  Buford Dresser, MD, PhD, [facility]   Medication Adjustments/Labs and Tests Ordered: Current medicines are reviewed at length with the patient today.  Concerns regarding medicines  are outlined above.  No orders of the defined types were placed in this encounter.  No orders of the defined types were placed in this encounter.   Patient Instructions  Medication Instructions:  The current medical regimen is effective;  continue present plan and medications as directed. Please refer to the Current Medication list given to you today.  *If you need a refill on your cardiac medications before your next appointment, please call your pharmacy*  Lab Work:   Testing/Procedures:  NONE    NONE  Follow-Up: Your next appointment:  6 month(s) In Person with Buford Dresser, MD  At [facility], you and your health needs are our priority.  As part of our continuing mission to provide you with exceptional heart care, we have created designated Provider Care Teams.  These Care Teams include your primary Cardiologist (physician) and Advanced Practice Providers (APPs -  Physician Assistants and Nurse Practitioners) who all work together to provide you with the care you need, when you need it.   Signed, Buford Dresser, MD PhD 01/18/2021 12:39 PM    [place]

## 2021-01-25 ENCOUNTER — Ambulatory Visit (INDEPENDENT_AMBULATORY_CARE_PROVIDER_SITE_OTHER): Payer: Medicare Other | Admitting: Pharmacist

## 2021-01-25 ENCOUNTER — Other Ambulatory Visit: Payer: Self-pay

## 2021-01-25 DIAGNOSIS — Z7901 Long term (current) use of anticoagulants: Secondary | ICD-10-CM | POA: Diagnosis not present

## 2021-01-25 DIAGNOSIS — I482 Chronic atrial fibrillation, unspecified: Secondary | ICD-10-CM

## 2021-01-25 LAB — POCT INR: INR: 2.4 (ref 2.0–3.0)

## 2021-01-25 NOTE — Patient Instructions (Signed)
Description   Continue with 1 tablet daily except 1.5 tablets each Monday and Friday.  Repeat INR in 5 weeks

## 2021-01-28 ENCOUNTER — Other Ambulatory Visit: Payer: Self-pay

## 2021-01-28 ENCOUNTER — Ambulatory Visit (HOSPITAL_COMMUNITY)
Admission: RE | Admit: 2021-01-28 | Discharge: 2021-01-28 | Disposition: A | Discharge status: Home / Self Care | Payer: Medicare Other | Source: Ambulatory Visit | Attending: Internal Medicine | Admitting: Internal Medicine

## 2021-01-28 DIAGNOSIS — C7931 Secondary malignant neoplasm of brain: Secondary | ICD-10-CM | POA: Diagnosis not present

## 2021-01-28 IMAGING — MR MR HEAD WO/W CM
10 of 14 series · 30 of 48 positions shown · IV contrast (Contrast agent)
Comparison: Prior brain MRI examinations [DATE] and earlier.

CLINICAL DATA: Brain metastasis. Brain/CNS neoplasm, assess
treatment response. Additional history obtained from prior radiology
records: Metastatic renal cell carcinoma, prior resection of a
solitary right frontal brain metastasis in [DATE] followed by
postoperative SRS [DATE] and subsequent salvage SRS [DATE].

EXAM:
MRI HEAD WITHOUT AND WITH CONTRAST
TECHNIQUE: Multiplanar, multiecho pulse sequences of the brain and surrounding
structures were obtained without and with intravenous contrast.
CONTRAST:  8mL GADAVIST GADOBUTROL 1 MMOL/ML IV SOLN

[Series 5: FLAIR · sagittal · 3.0mm · 0.78mm/px · 1 of 38 slices shown (1 of 3)]
[im 1/38]
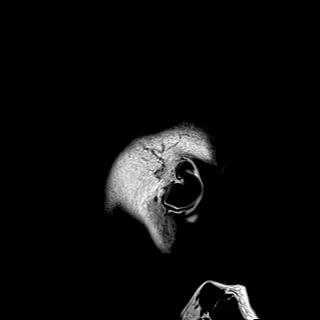

[Series 6: DWI · axial · 3.0mm · 0.96mm/px · z∈[-125,+33]mm · 5 of 108 slices shown (1 of 2)]
[im 1/108]
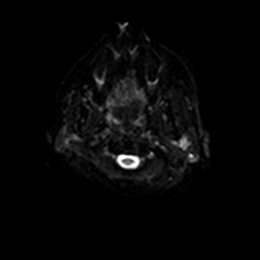
[im 27/108]
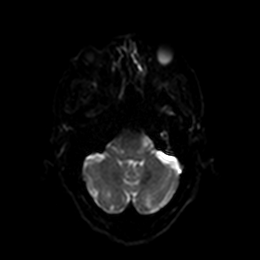
[im 54/108]
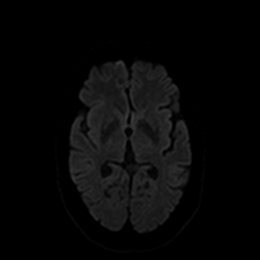
[im 81/108]
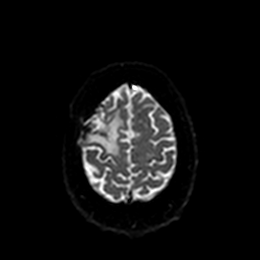
[im 108/108]
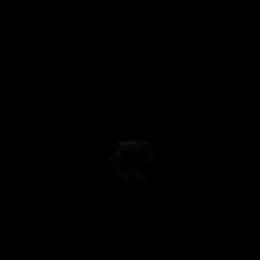

[Series 7: DWI · axial · 3.0mm · 0.96mm/px · z∈[-125,+33]mm · 2 of 54 slices shown (2 of 2)]
[im 1/54]
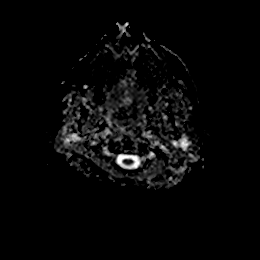
[im 54/54]
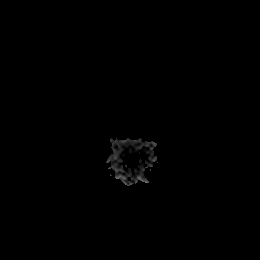

[Series 9: FLAIR · axial · 2.0mm · 0.49mm/px · z∈[-144,+36]mm · 4 of 76 slices shown (2 of 3)]
[im 1/76]
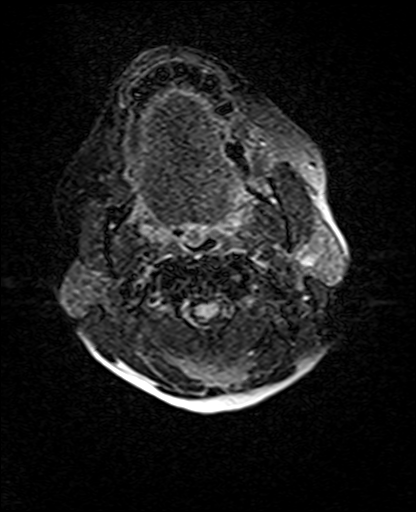
[im 26/76]
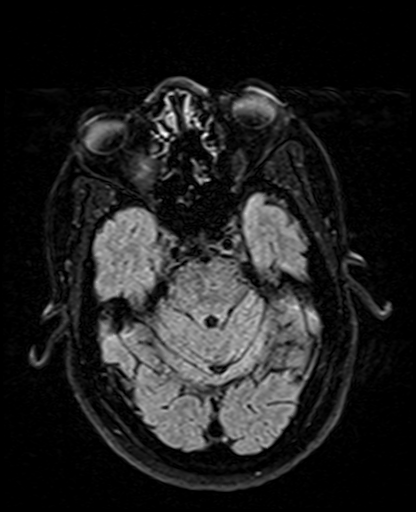
[im 51/76]
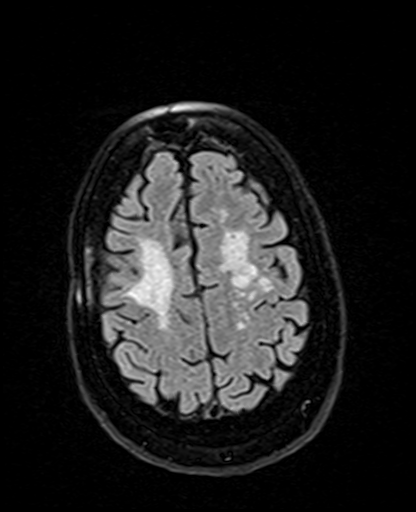
[im 76/76]
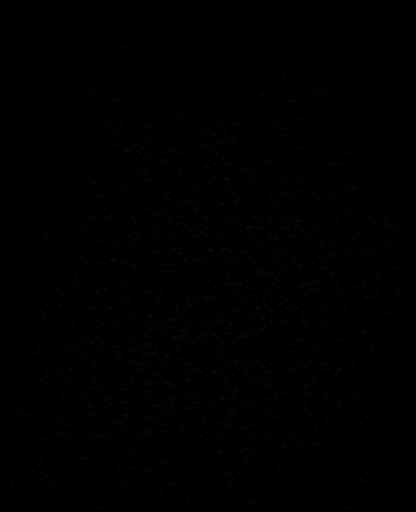

[Series 10: t1_mprage_tra_p2_iso no angle · axial · 1.0mm · 0.98mm/px · z∈[-139,+36]mm · 8 of 174 slices shown]
[im 1/174]
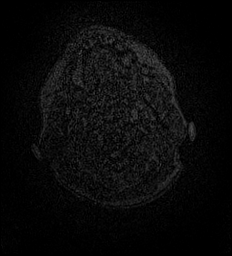
[im 25/174]
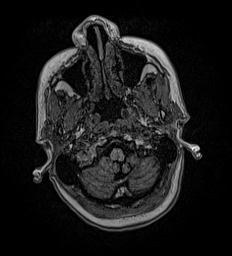
[im 50/174]
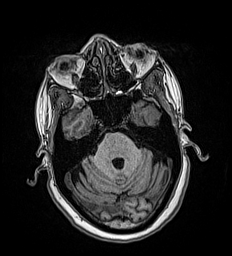
[im 75/174]
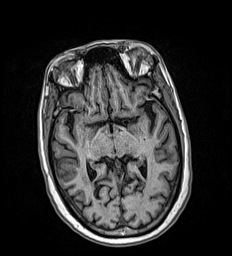
[im 99/174]
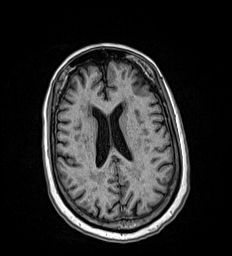
[im 124/174]
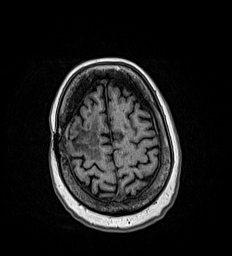
[im 149/174]
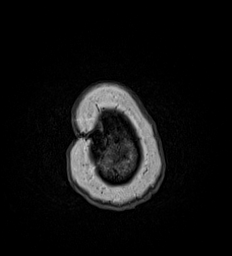
[im 174/174]
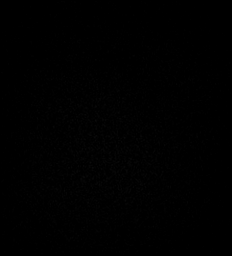

[Series 12: mag_images · axial · 3.0mm · 0.98mm/px · 1 of 60 slices shown]
[im 1/60]
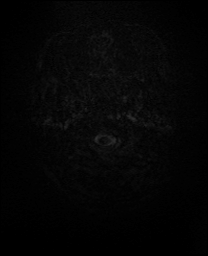

[Series 17: T2 · coronal · 3.0mm · 0.34mm/px · 3 of 58 slices shown (1 of 2)]
[im 1/58]
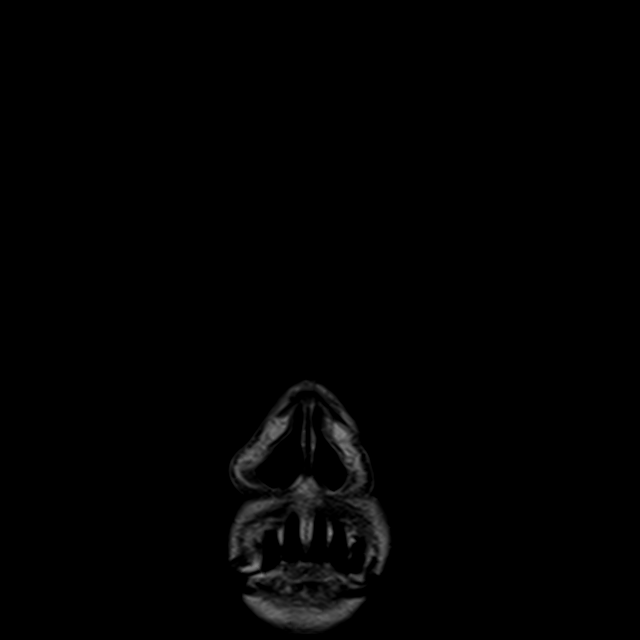
[im 29/58]
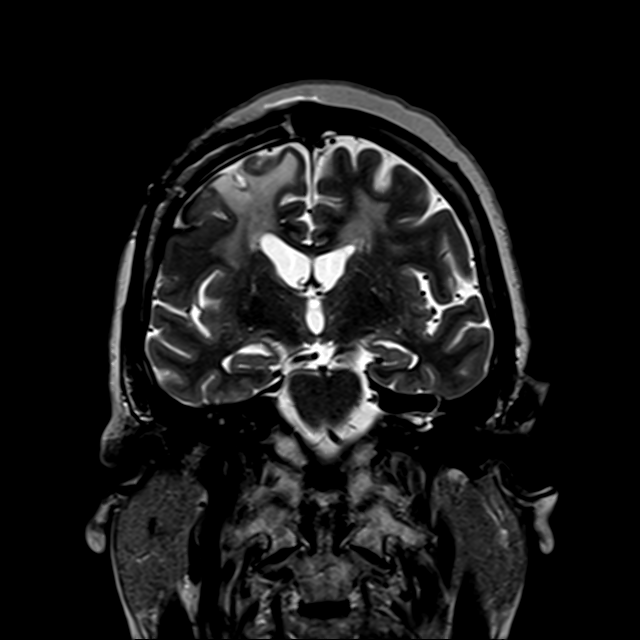
[im 58/58]
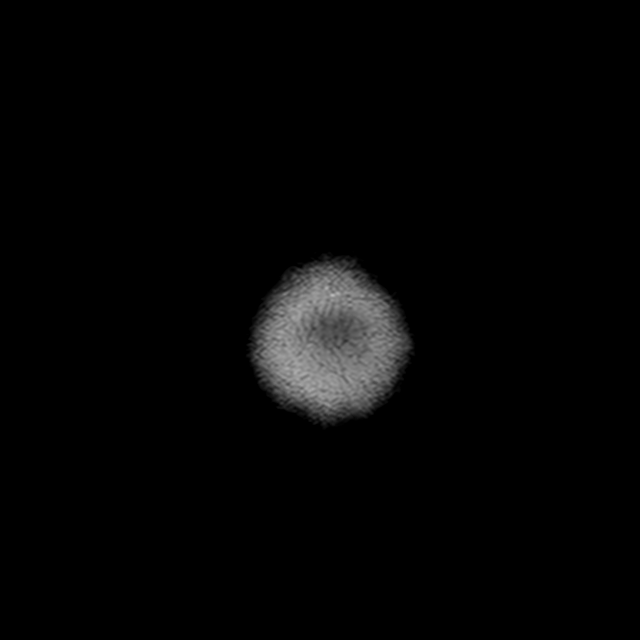

[Series 18: T1 post-contrast · coronal · 3.0mm · 0.34mm/px · 3 of 58 slices shown]
[im 1/58]
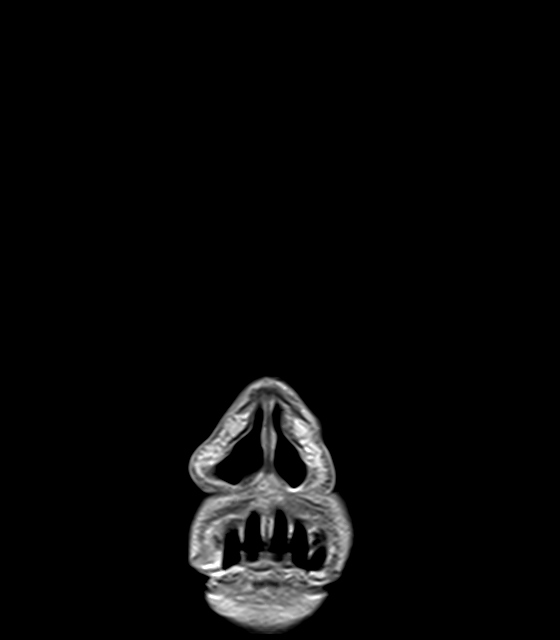
[im 29/58]
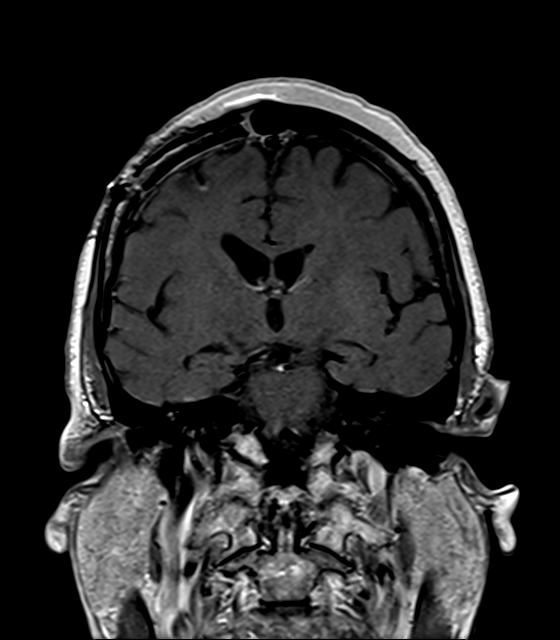
[im 58/58]
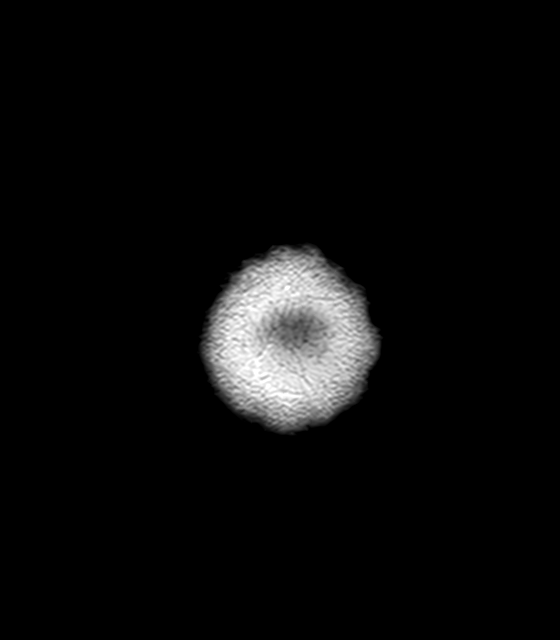

[Series 21: T2 · axial · 5.0mm · 0.78mm/px · 1 of 28 slices shown (2 of 2)]
[im 1/28]
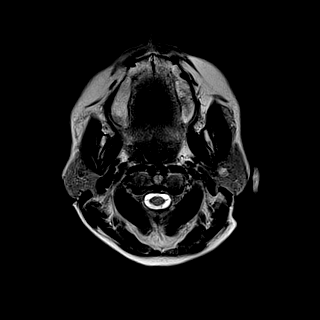

[Series 22: FLAIR · sagittal · 3.0mm · 0.78mm/px · 2 of 38 slices shown (3 of 3)]
[im 1/38]
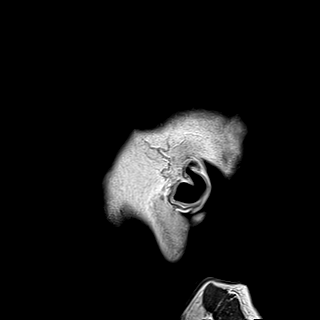
[im 38/38]
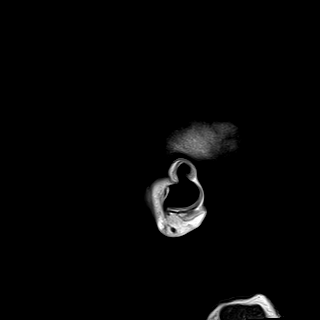

[30 of 48 positions shown; findings below may reference images not displayed]

FINDINGS: Brain:

Mild generalized cerebral and cerebellar atrophy.

New from the prior brain MRI of [DATE], there is a 4 mm
enhancing cortical metastasis within the anterior left frontal lobe
(series 19, image 94).

Redemonstrated resection cavity within the high posterior right
frontal lobe with unchanged non-nodular peripheral enhancement and
mild adjacent postoperative dural thickening. As before, chronic
hemosiderin deposition is present at this site. Surrounding T2/FLAIR
hyperintense signal abnormality has not significantly changed. Mild
ex vacuo dilatation of the right lateral ventricle.

Unchanged small focus of cortical T2/FLAIR hyperintense signal
abnormality at the right frontoparietal junction (series 9, image
39) series 17, image 21). This may reflect a small focus of chronic
cortical encephalomalacia, although no definite volume loss is
appreciated at this site.

Background moderate multifocal T2/FLAIR hyperintensity within the
cerebral white matter, nonspecific but compatible with chronic small
vessel ischemic disease. Mild chronic small-vessel ischemic changes
are also present within the pons.

Redemonstrated chronic lacunar infarcts within the bilateral basal
ganglia/internal capsules, thalami and cerebellar hemispheres.

As before, there are a few scattered chronic microhemorrhages within
the bilateral cerebral hemispheres.

There is no acute infarct.

No extra-axial fluid collection.

No midline shift.

Vascular: Maintained flow voids within the proximal large arterial
vessels.

Skull and upper cervical spine: Right frontoparietal cranioplasty. A
6 mm focus of enhancement within the right temporoparietal calvarium
is new from the brain MRI of [DATE], and highly suspicious for
an osseous metastasis (series 19, image 106).

Sinuses/Orbits: Unchanged prominent orbital fat bilaterally with
bilateral proptosis and medial bulging of the right lamina papyracea
in this patient with a history of thyroid eye disease. No
significant paranasal sinus disease.

Other: Stable focus of T2 hyperintense signal abnormality within the
left parotid gland. The possibility of a vascular malformation at
this site is again raised.
IMPRESSION: 4 mm enhancing cortical metastasis within the anterior left frontal
lobe, new from the brain MRI of [DATE].

[DATE] mm focus of enhancement within the right temporoparietal
calvarium, also new from the prior exam, and highly suspicious for
an osseous metastasis.

Stable appearance of posttreatment changes within the right frontal
lobe.

Unchanged small focus of cortical T2/FLAIR hyperintense signal
abnormality within the right temporoparietal junction. This could
reflect a small chronic focus of cortical encephalomalacia, although
no definite volume loss is appreciated at this site. Continued
attention recommended on follow-up.

## 2021-01-28 MED ORDER — GADOBUTROL 1 MMOL/ML IV SOLN
8.0000 mL | Freq: Once | INTRAVENOUS | Status: AC | PRN
  Administered 2021-01-28: 8 mL via INTRAVENOUS

## 2021-01-28 NOTE — Progress Notes (Signed)
Patient here today at [facility] for MRI brain w wo contrast. Patient has Product/process development scientist. [employer]- [employer] linked in via [employer] RV/LV thresholds assessed. Programmed DOO 80. Will undo MRI settings post completion of scan.

## 2021-01-29 ENCOUNTER — Ambulatory Visit (HOSPITAL_BASED_OUTPATIENT_CLINIC_OR_DEPARTMENT_OTHER): Payer: Medicare Other | Admitting: Internal Medicine

## 2021-01-29 DIAGNOSIS — Z0000 Encounter for general adult medical examination without abnormal findings: Secondary | ICD-10-CM

## 2021-01-29 NOTE — Progress Notes (Signed)
Subjective:   Jacqueline Palmer is a 75 y.o. female who presents for an Initial Medicare Annual Wellness Visit.  I connected with  Sybella Harnish on 01/29/21 by an audio only telemedicine application and verified that I am speaking with the correct person using two identifiers.   I discussed the limitations, risks, security and privacy concerns of performing an evaluation and management service by telephone and the availability of in person appointments. I also discussed with the patient that there may be a patient responsible charge related to this service. The patient expressed understanding and verbally consented to this telephonic visit.  Location of Patient: Home Location of Provider: Office  List any persons and their role that are participating in the visit with the patient.   Herschel Senegal Chistina Roston  Review of Systems    Defer to PCP   Pt was charged for tdap and was not sure if it was billed correctly    Objective:    There were no vitals filed for this visit. There is no height or weight on file to calculate BMI.  Advanced Directives 01/29/2021 12/03/2020 06/17/2020 04/14/2020 04/08/2020 02/24/2020 02/12/2020  Does Patient Have a Medical Advance Directive? Yes No Yes Yes Yes Yes Yes  Type of Advance Directive - Programmer, multimedia of [employer] Power of [place];Living will - [place];Living will  Copy of [place] in Chart? - - - No - copy requested - No - copy requested No - copy requested  Would patient like information on creating a medical advance directive? - Yes (ED - Information included in AVS) No - Patient declined No - Patient declined No - Patient declined - No - Patient declined    Current Medications (verified) Outpatient Encounter Medications as of 01/29/2021  Medication Sig   acetaminophen (TYLENOL) 500 MG tablet Take 1,000 mg by mouth in the morning and at bedtime.   colchicine 0.6 MG  tablet Take 1 tablet (0.6 mg total) by mouth at bedtime.   hydrALAZINE (APRESOLINE) 10 MG tablet TAKE ONE TABLET BY MOUTH THREE TIMES DAILY   levETIRAcetam (KEPPRA) 500 MG tablet Take 1 tablet (500 mg total) by mouth 2 (two) times daily.   levothyroxine (SYNTHROID) 25 MCG tablet TAKE ONE TABLET BY MOUTH ONE TIME DAILY 30 to 60 minutes before breakfast on an empty stomach with a full glass of water   metoprolol succinate (TOPROL-XL) 25 MG 24 hr tablet Take 1 tablet (25 mg total) by mouth daily.   nitroGLYCERIN (NITROSTAT) 0.4 MG SL tablet Place 1 tablet (0.4 mg total) under the tongue every 5 (five) minutes as needed for chest pain.   polyethylene glycol (MIRALAX / GLYCOLAX) 17 g packet Take 17 g by mouth daily as needed for mild constipation.   pravastatin (PRAVACHOL) 20 MG tablet Take 20 mg by mouth at bedtime.   spironolactone (ALDACTONE) 25 MG tablet Take 1 tablet (25 mg total) by mouth daily.   torsemide (DEMADEX) 20 MG tablet TAKE TWO TABLETS BY MOUTH IN THE MORNING AND ONE IN THE EVENING   warfarin (COUMADIN) 5 MG tablet Take 1 tablet (5 mg total) by mouth every evening.   No facility-administered encounter medications on file as of 01/29/2021.    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   SN [id number] Model [id number] [From patient's surgical info card]   Thrombus 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 myocardial infarction    Thyroid disease    Past Surgical History:  Procedure Laterality Date   ABDOMINAL HYSTERECTOMY     CORONARY ARTERY BYPASS GRAFT      CRANIOTOMY Right 07/30/2019   Right frontal   NEPHRECTOMY Right 10/16/2019   Family History  Problem Relation Age of Onset   Heart attack Mother    Heart attack Father    Heart attack Sister    Breast cancer Neg Hx    Social History   Socioeconomic History   Marital status: Widowed    Spouse name: Not on file   Number of children: 0   Years of education: Doctorate   Highest education level: Master's degree (e.g., MA, MS, MEng, MEd, MSW, MBA)  Occupational History   Occupation: Retired Clinical biochemist  Tobacco Use   Smoking status: Former    Types: Cigarettes    Quit date: 07/30/2014    Years since quitting: 6.5   Smokeless tobacco: Never  Vaping Use   Vaping Use: Never used  Substance and Sexual Activity   Alcohol use: Yes    Comment: 1 glass wine twice weekly   Drug use: No   Sexual activity: Not on file  Other Topics Concern   Not on file  Social History Narrative   Right handed   Coffee daily/tea qod   Social Determinants of Health   Financial Resource Strain: Not on file  Food Insecurity: Not on file  Transportation Needs: Not on file  Physical Activity: Not on file  Stress: Not on file  Social Connections: Not on file    Tobacco Counseling Counseling given: Not Answered   Clinical Intake:  Pre-visit preparation completed: Yes  Pain : No/denies pain     Nutritional Risks: None Diabetes: No  How often do you need to have someone help you when you read instructions, pamphlets, or other written materials from your doctor or pharmacy?: 1 - Never  Diabetic?no  Interpreter Needed?: No      Activities of Daily Living No flowsheet data found.  Patient Care Team: Ladell Pier, MD as PCP - General (Internal Medicine) Buford Dresser, MD as PCP - Cardiology (Cardiology)  Indicate any recent Medical Services you may have received from other than [facility] providers in the past year (date may be approximate).     Assessment:   This is a  routine wellness examination for [place].  Hearing/Vision screen No results found.  Dietary issues and exercise activities discussed:     Goals Addressed   None    Depression Screen PHQ 2/9 Scores 01/29/2021 12/14/2020 05/03/2020  PHQ - 2 Score 0 0 0    Fall Risk Fall Risk  01/29/2021 12/14/2020 05/03/2020  Falls in the past year? 0 0 0  Number falls in past yr: 0 0 0  Injury with Fall? 0 0 0  Risk for fall due to : - No Fall Risks -    FALL RISK PREVENTION PERTAINING TO THE HOME:  Any stairs in or around the home? Yes  If so, are there any without handrails? Yes  Home free of loose throw rugs in walkways, pet  beds, electrical cords, etc? No  Adequate lighting in your home to reduce risk of falls? No   ASSISTIVE DEVICES UTILIZED TO PREVENT FALLS:  Life alert? No  Use of a cane, walker or w/c? Yes  Grab bars in the bathroom? Yes  Shower chair or bench in shower? Yes  Elevated toilet seat or a handicapped toilet? Yes   TIMED UP AND GO:  Was the test performed?  n/a .  Length of time to ambulate 10 feet: n/a sec.   *These questions cannot be answered via Telephone Encounter.  Cognitive Function:     6CIT Screen 01/29/2021  What Year? 0 points  What month? 0 points  What time? 0 points  Count back from 20 0 points  Months in reverse 0 points    Immunizations Immunization History  Administered Date(s) Administered   Influenza, High Dose Seasonal PF 02/16/2016, 04/20/2017   PFIZER Comirnaty(Gray Top)Covid-19 Tri-Sucrose Vaccine 07/03/2019, 07/24/2019   PFIZER(Purple Top)SARS-COV-2 Vaccination 03/29/2020   Pneumococcal Polysaccharide-23 08/11/2016   Tdap 12/14/2020    TDAP status: Up to date  Flu Vaccine status: Due, Education has been provided regarding the importance of this vaccine. Advised may receive this vaccine at local pharmacy or Health Dept. Aware to provide a copy of the vaccination record if obtained from local pharmacy or Health Dept. Verbalized  acceptance and understanding.  Pneumococcal vaccine status: Due, Education has been provided regarding the importance of this vaccine. Advised may receive this vaccine at local pharmacy or Health Dept. Aware to provide a copy of the vaccination record if obtained from local pharmacy or Health Dept. Verbalized acceptance and understanding.  Covid-19 vaccine status: Information provided on how to obtain vaccines.   Qualifies for Shingles Vaccine? Yes   Zostavax completed No   Shingrix Completed?: No.    Education has been provided regarding the importance of this vaccine. Patient has been advised to call insurance company to determine out of pocket expense if they have not yet received this vaccine. Advised may also receive vaccine at local pharmacy or Health Dept. Verbalized acceptance and understanding.  Screening Tests Health Maintenance  Topic Date Due   Hepatitis C Screening  Never done   Zoster Vaccines- Shingrix (1 of 2) Never done   PNA vac Low Risk Adult (2 of 2 - PCV13) 08/11/2017   COVID-19 Vaccine (4 - Booster for Pfizer series) 06/21/2020   INFLUENZA VACCINE  12/20/2020   COLONOSCOPY (Pts 45-20yrs Insurance coverage will need to be confirmed)  08/04/2022   TETANUS/TDAP  12/15/2030   DEXA SCAN  Completed   HPV VACCINES  Aged Out    Health Maintenance  Health Maintenance Due  Topic Date Due   Hepatitis C Screening  Never done   Zoster Vaccines- Shingrix (1 of 2) Never done   PNA vac Low Risk Adult (2 of 2 - PCV13) 08/11/2017   COVID-19 Vaccine (4 - Booster for Pfizer series) 06/21/2020   INFLUENZA VACCINE  12/20/2020    Colorectal cancer screening: Type of screening: Colonoscopy. Completed 08/03/2017. Repeat every 5 years  Mammogram status: Ordered 08/16/20. Pt provided with contact info and advised to call to schedule appt.   Bone Density status: Completed 07/04/2018. Results reflect: Bone density results: OSTEOPENIA. Repeat every 2 years.  Lung Cancer Screening:  (Low Dose CT Chest recommended if Age 59-80 years, 30 pack-year currently smoking OR have quit w/in 15years.) does qualify.   Lung Cancer Screening Referral: 01/29/21  Additional Screening:  Hepatitis C Screening: does qualify; Completed  n/a  Vision Screening: Recommended annual ophthalmology exams for early detection of glaucoma and other disorders of the eye. Is the patient up to date with their annual eye exam?  No  Who is the provider or what is the name of the office in which the patient attends annual eye exams? N/a If pt is not established with a provider, would they like to be referred to a provider to establish care? Yes .   Dental Screening: Recommended annual dental exams for proper oral hygiene  Community Resource Referral / Chronic Care Management: CRR required this visit?  No   CCM required this visit?  Yes      Plan:     I have personally reviewed and noted the following in the patient's chart:   Medical and social history Use of alcohol, tobacco or illicit drugs  Current medications and supplements including opioid prescriptions. Patient is not currently taking opioid prescriptions. Functional ability and status Nutritional status Physical activity Advanced directives List of other physicians Hospitalizations, surgeries, and ER visits in previous 12 months Vitals Screenings to include cognitive, depression, and falls Referrals and appointments  In addition, I have reviewed and discussed with patient certain preventive protocols, quality metrics, and best practice recommendations. A written personalized care plan for preventive services as well as general preventive health recommendations were provided to patient.     Octaviano Glow, CMA   01/29/2021   Nurse Notes: Non-Face to Face 20 minute visit Encounter.   Ms. Fraiser , Thank you for taking time to come for your Medicare Wellness Visit. I appreciate your ongoing commitment to your health goals.  Please review the following plan we discussed and let me know if I can assist you in the future.   These are the goals we discussed:  Goals   None     This is a list of the screening recommended for you and due dates:  Health Maintenance  Topic Date Due   Hepatitis C Screening: USPSTF Recommendation to screen - Ages 23-79 yo.  Never done   Zoster (Shingles) Vaccine (1 of 2) Never done   Pneumonia vaccines (2 of 2 - PCV13) 08/11/2017   COVID-19 Vaccine (4 - Booster for Pfizer series) 06/21/2020   Flu Shot  12/20/2020   Colon Cancer Screening  08/04/2022   Tetanus Vaccine  12/15/2030   DEXA scan (bone density measurement)  Completed   HPV Vaccine  Aged Out

## 2021-01-31 ENCOUNTER — Inpatient Hospital Stay: Payer: Medicare Other

## 2021-01-31 DIAGNOSIS — Z87891 Personal history of nicotine dependence: Secondary | ICD-10-CM | POA: Insufficient documentation

## 2021-01-31 DIAGNOSIS — Z79899 Other long term (current) drug therapy: Secondary | ICD-10-CM | POA: Insufficient documentation

## 2021-01-31 DIAGNOSIS — C641 Malignant neoplasm of right kidney, except renal pelvis: Secondary | ICD-10-CM | POA: Insufficient documentation

## 2021-01-31 DIAGNOSIS — Z905 Acquired absence of kidney: Secondary | ICD-10-CM | POA: Insufficient documentation

## 2021-01-31 DIAGNOSIS — C7931 Secondary malignant neoplasm of brain: Secondary | ICD-10-CM | POA: Insufficient documentation

## 2021-01-31 DIAGNOSIS — N289 Disorder of kidney and ureter, unspecified: Secondary | ICD-10-CM | POA: Insufficient documentation

## 2021-02-01 ENCOUNTER — Ambulatory Visit
Admission: RE | Admit: 2021-02-01 | Discharge: 2021-02-01 | Disposition: A | Discharge status: Home / Self Care | Payer: Medicare Other | Source: Ambulatory Visit | Attending: Radiation Oncology | Admitting: Radiation Oncology

## 2021-02-01 ENCOUNTER — Inpatient Hospital Stay (HOSPITAL_BASED_OUTPATIENT_CLINIC_OR_DEPARTMENT_OTHER): Payer: Medicare Other | Admitting: Internal Medicine

## 2021-02-01 ENCOUNTER — Other Ambulatory Visit: Payer: Self-pay | Admitting: *Deleted

## 2021-02-01 ENCOUNTER — Other Ambulatory Visit: Payer: Self-pay

## 2021-02-01 VITALS — BP 115/82 | HR 96 | Temp 97.2°F | Resp 18 | Wt 190.5 lb

## 2021-02-01 DIAGNOSIS — Z510 Encounter for antineoplastic radiation therapy: Secondary | ICD-10-CM | POA: Insufficient documentation

## 2021-02-01 DIAGNOSIS — Z79899 Other long term (current) drug therapy: Secondary | ICD-10-CM | POA: Diagnosis not present

## 2021-02-01 DIAGNOSIS — C7931 Secondary malignant neoplasm of brain: Secondary | ICD-10-CM | POA: Diagnosis present

## 2021-02-01 DIAGNOSIS — M792 Neuralgia and neuritis, unspecified: Secondary | ICD-10-CM

## 2021-02-01 DIAGNOSIS — C641 Malignant neoplasm of right kidney, except renal pelvis: Secondary | ICD-10-CM | POA: Diagnosis not present

## 2021-02-01 DIAGNOSIS — Z903 Acquired absence of stomach [part of]: Secondary | ICD-10-CM | POA: Insufficient documentation

## 2021-02-01 DIAGNOSIS — R569 Unspecified convulsions: Secondary | ICD-10-CM | POA: Diagnosis not present

## 2021-02-01 DIAGNOSIS — N289 Disorder of kidney and ureter, unspecified: Secondary | ICD-10-CM | POA: Diagnosis not present

## 2021-02-01 DIAGNOSIS — Z87891 Personal history of nicotine dependence: Secondary | ICD-10-CM | POA: Insufficient documentation

## 2021-02-01 DIAGNOSIS — C649 Malignant neoplasm of unspecified kidney, except renal pelvis: Secondary | ICD-10-CM

## 2021-02-01 LAB — CBC WITH DIFFERENTIAL (CANCER CENTER ONLY)
Abs Immature Granulocytes: 0.01 10*3/uL (ref 0.00–0.07)
Basophils Absolute: 0.1 10*3/uL (ref 0.0–0.1)
Basophils Relative: 1 %
Eosinophils Absolute: 0.2 10*3/uL (ref 0.0–0.5)
Eosinophils Relative: 3 %
HCT: 38.9 % (ref 36.0–46.0)
Hemoglobin: 12.5 g/dL (ref 12.0–15.0)
Immature Granulocytes: 0 %
Lymphocytes Relative: 37 %
Lymphs Abs: 2.3 10*3/uL (ref 0.7–4.0)
MCH: 23.5 pg — ABNORMAL LOW (ref 26.0–34.0)
MCHC: 32.1 g/dL (ref 30.0–36.0)
MCV: 73.1 fL — ABNORMAL LOW (ref 80.0–100.0)
Monocytes Absolute: 0.7 10*3/uL (ref 0.1–1.0)
Monocytes Relative: 12 %
Neutro Abs: 3 10*3/uL (ref 1.7–7.7)
Neutrophils Relative %: 47 %
Platelet Count: 255 10*3/uL (ref 150–400)
RBC: 5.32 MIL/uL — ABNORMAL HIGH (ref 3.87–5.11)
RDW: 14.6 % (ref 11.5–15.5)
WBC Count: 6.3 10*3/uL (ref 4.0–10.5)
nRBC: 0 % (ref 0.0–0.2)

## 2021-02-01 LAB — CMP (CANCER CENTER ONLY)
ALT: 16 U/L (ref 0–44)
AST: 21 U/L (ref 15–41)
Albumin: 4.6 g/dL (ref 3.5–5.0)
Alkaline Phosphatase: 88 U/L (ref 38–126)
Anion gap: 12 (ref 5–15)
BUN: 43 mg/dL — ABNORMAL HIGH (ref 8–23)
CO2: 24 mmol/L (ref 22–32)
Calcium: 10.1 mg/dL (ref 8.9–10.3)
Chloride: 101 mmol/L (ref 98–111)
Creatinine: 2.74 mg/dL — ABNORMAL HIGH (ref 0.44–1.00)
GFR, Estimated: 18 mL/min — ABNORMAL LOW (ref >60)
Glucose, Bld: 86 mg/dL (ref 70–99)
Potassium: 4.2 mmol/L (ref 3.5–5.1)
Sodium: 137 mmol/L (ref 135–145)
Total Bilirubin: 0.8 mg/dL (ref 0.3–1.2)
Total Protein: 8 g/dL (ref 6.5–8.1)

## 2021-02-01 NOTE — Progress Notes (Signed)
[name] [address] [place], [place] [address] [phone]   Interval Evaluation  Date of Service: 02/01/21 Patient Name: Jacqueline Palmer Patient MRN: [id number] Patient DOB: [birth year] Provider: Ventura Sellers, MD  Identifying Statement:  Jacqueline Palmer is a 75 y.o. female with Brain metastasis [facility]) [C79.31]   Primary Cancer: Renal Cell Carcinoma, Stage IV  Oncologic History: 07/30/19: Right frontal craniotomy, resection of solitary brain metastasis at [facility] 10/06/19: Post-operative SRS at [facility] (Dr. Tyrone Nine) 03/09/20: Salvage SRS to R frontal+LMD 27/3x  Interval History:  Jacqueline Palmer for follow up after recent MRI brain.  No new or progressive complaints Palmer.  No recent seizures, continues to take Keppra 564m twice per day.  Sciatica pain is "3 out of 10" and responding somewhat to PT.  Gabapentin was not helpful.  Continues to be active mentally and engaging socially; also working with PT and OT at her residential facility.  H+P (02/24/20) Patient presents to review recent changes on brain MRI.  She initially presented in March 2021 with a seizure, described as "left side clenching up, lost awareness for a few minutes".  CNS imaging demonstrated an enhancing right frontal mass, c/w likely metastasis from renal cell carcinoma.  [facility]  She did have one recurrence of seizure activity in late August, at which time she was restarted on Keppra.  Unclear on why this was discontinued.  Recent MRI demonstrated some change and she presents Palmer for treatment recommendations.  No neurologic complaints aside from seizures.  Medications: Current Outpatient Medications on File Prior to Visit  Medication Sig Dispense Refill   acetaminophen (TYLENOL) 500 MG tablet Take 1,000 mg by mouth in the morning and at bedtime.     colchicine 0.6 MG tablet Take 1  tablet (0.6 mg total) by mouth at bedtime. 90 tablet 3   hydrALAZINE (APRESOLINE) 10 MG tablet TAKE ONE TABLET BY MOUTH THREE TIMES DAILY 270 tablet 1   levETIRAcetam (KEPPRA) 500 MG tablet Take 1 tablet (500 mg total) by mouth 2 (two) times daily. 60 tablet 0   levothyroxine (SYNTHROID) 25 MCG tablet TAKE ONE TABLET BY MOUTH ONE TIME DAILY 30 to 60 minutes before breakfast on an empty stomach with a full glass of water 90 tablet 2   metoprolol succinate (TOPROL-XL) 25 MG 24 hr tablet Take 1 tablet (25 mg total) by mouth daily. 90 tablet 3   nitroGLYCERIN (NITROSTAT) 0.4 MG SL tablet Place 1 tablet (0.4 mg total) under the tongue every 5 (five) minutes as needed for chest pain. 25 tablet 3   polyethylene glycol (MIRALAX / GLYCOLAX) 17 g packet Take 17 g by mouth daily as needed for mild constipation.     pravastatin (PRAVACHOL) 20 MG tablet Take 20 mg by mouth at bedtime.     spironolactone (ALDACTONE) 25 MG tablet Take 1 tablet (25 mg total) by mouth daily. 90 tablet 3   torsemide (DEMADEX) 20 MG tablet TAKE TWO TABLETS BY MOUTH IN THE MORNING AND ONE IN THE EVENING 90 tablet 3   warfarin (COUMADIN) 5 MG tablet Take 1 tablet (5 mg total) by mouth every evening. 90 tablet 1   No current facility-administered medications on file prior to visit.    Allergies:  Allergies  Allergen Reactions   Avocado Shortness Of Breath    Before 2007   Banana Anaphylaxis and Swelling   Plantain Anaphylaxis and  Swelling   Ace Inhibitors Cough   Codeine Other (See Comments)    Nausea and Feels Jittery   Prednisone Swelling    Of face and lower extremities   Tape Hives    Tape  Does better with paper tape  Tape  Does better with paper tape     Latex Rash   Past Medical History:  Past Medical History:  Diagnosis Date   Acute kidney injury ([place])    Atrial fibrillation (H[facility])    Cardiomyopathy ([place])    CHF (congestive heart failure) (HCC)    Chronic a-fib ([place]) 08/14/2016   Chronic anticoagulation  08/14/2016   Coronary artery disease    4v CABG 6/17   Hypertension    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   SN [id number] [From patient's surgical info card]   Thrombus 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 myocardial infarction    Thyroid disease    Past Surgical History:  Past Surgical History:  Procedure Laterality Date   ABDOMINAL HYSTERECTOMY     CORONARY ARTERY BYPASS GRAFT     CRANIOTOMY Right 07/30/2019   Right frontal   NEPHRECTOMY Right 10/16/2019   Social History:  Social History   Socioeconomic History   Marital status: Widowed    Spouse name: Not on file   Number of children: 0   Years of education: Doctorate   Highest education level: Master's degree (e.g., MA, MS, MEng, MEd, MSW, MBA)  Occupational History   Occupation: Retired CClinical biochemist Tobacco Use   Smoking status: Former    Types: Cigarettes    Quit date: 07/30/2014    Years since quitting: 6.5   Smokeless tobacco: Never  Vaping Use   Vaping Use: Never used  Substance and Sexual Activity   Alcohol use: Yes    Comment: 1 glass wine twice weekly   Drug use: No   Sexual activity: Not on file  Other Topics Concern   Not on file  Social History Narrative   Right handed   Coffee daily/tea qod   Social Determinants of Health   Financial Resource Strain: Not on file  Food Insecurity: Not on file  Transportation Needs: Not on file  Physical Activity: Not on file  Stress: Not on file  Social Connections: Not on file  Intimate Partner Violence: Not on file   Family History:  Family History  Problem Relation Age of Onset   Heart attack Mother    Heart attack Father    Heart attack Sister    Breast cancer Neg Hx     Review of Systems: Constitutional: Doesn't report fevers, chills or abnormal weight loss Eyes: Doesn't report blurriness of vision Ears, nose, mouth, throat, and face: Doesn't report sore throat Respiratory: Doesn't report cough, dyspnea or  wheezes Cardiovascular: Doesn't report palpitation, chest discomfort  Gastrointestinal:  Doesn't report nausea, constipation, diarrhea GU: Doesn't report incontinence Skin: Doesn't report skin rashes Neurological: Per HPI Musculoskeletal: Doesn't report joint pain Behavioral/Psych: Doesn't report anxiety  Physical Exam: Vitals:   02/01/21 1158  BP: 115/82  Pulse: 96  Resp: 18  Temp: (!) 97.2 F (36.2 C)  SpO2: 100%    KPS: 80. General: Alert, cooperative, pleasant, in no acute distress Head: Normal EENT: No conjunctival injection or scleral icterus.  Lungs: Resp effort normal Cardiac: Regular rate Abdomen: Non-distended abdomen Skin: No rashes cyanosis or petechiae. Extremities: No clubbing or edema  Neurologic Exam: Mental Status: Awake, alert, attentive to examiner. Oriented to self and  environment. Language is fluent with intact comprehension.  Cranial Nerves: Visual acuity is grossly normal. Visual fields are full. Extra-ocular movements intact. No ptosis. Face is symmetric Motor: Tone and bulk are normal. Power is full in both arms and legs. Reflexes are symmetric, no pathologic reflexes present.  Sensory: Intact to light touch Gait: Orthopedic limitations  Labs: I have reviewed the data as listed    Component Value Date/Time   NA 141 12/14/2020 1053   K 4.2 12/14/2020 1053   CL 103 12/14/2020 1053   CO2 22 12/14/2020 1053   GLUCOSE 80 12/14/2020 1053   GLUCOSE 83 12/02/2020 1652   BUN 45 (H) 12/14/2020 1053   CREATININE 2.52 (H) 12/14/2020 1053   CREATININE 2.13 (H) 07/29/2020 1054   CALCIUM 9.5 12/14/2020 1053   PROT 7.8 07/29/2020 1054   ALBUMIN 4.1 07/29/2020 1054   AST 16 07/29/2020 1054   ALT 11 07/29/2020 1054   ALKPHOS 96 07/29/2020 1054   BILITOT 0.6 07/29/2020 1054   GFRNONAA 17 (L) 12/02/2020 1652   GFRNONAA 24 (L) 07/29/2020 1054   GFRAA 30 (L) 01/20/2020 1433   Lab Results  Component Value Date   WBC 7.3 12/02/2020   NEUTROABS 3.6  12/02/2020   HGB 12.2 12/02/2020   HCT 38.7 12/02/2020   MCV 75.1 (L) 12/02/2020   PLT 305 12/02/2020    Imaging:  [place] Clinician Interpretation: I have personally reviewed the CNS images as listed.  My interpretation, in the context of the patient's clinical presentation, is progressive disease  MR BRAIN W WO CONTRAST  Result Date: 01/30/2021 CLINICAL DATA:  Brain metastasis. Brain/CNS neoplasm, assess treatment response. Additional history obtained from prior radiology records: Metastatic renal cell carcinoma, prior resection of a solitary right frontal brain metastasis in March 2021 followed by postoperative [facility] May 2021 and subsequent salvage [facility] October 2021. EXAM: MRI HEAD WITHOUT AND WITH CONTRAST TECHNIQUE: Multiplanar, multiecho pulse sequences of the brain and surrounding structures were obtained without and with intravenous contrast. CONTRAST:  65m GADAVIST GADOBUTROL 1 MMOL/ML IV SOLN COMPARISON:  Prior brain MRI examinations 09/30/2020 and earlier. FINDINGS: Brain: Mild generalized cerebral and cerebellar atrophy. New from the prior brain MRI of 07/31/2020, there is a 4 mm enhancing cortical metastasis within the anterior left frontal lobe (series 19, image 94). Redemonstrated resection cavity within the high posterior right frontal lobe with unchanged non-nodular peripheral enhancement and mild adjacent postoperative dural thickening. As before, chronic hemosiderin deposition is present at this site. Surrounding T2/FLAIR hyperintense signal abnormality has not significantly changed. Mild ex vacuo dilatation of the right lateral ventricle. Unchanged small focus of cortical T2/FLAIR hyperintense signal abnormality at the right frontoparietal junction (series 9, image 39) series 17, image 21). This may reflect a small focus of chronic cortical encephalomalacia, although no definite volume loss is appreciated at this site. Background moderate multifocal T2/FLAIR hyperintensity within the  cerebral white matter, nonspecific but compatible with chronic small vessel ischemic disease. Mild chronic small-vessel ischemic changes are also present within the pons. Redemonstrated chronic lacunar infarcts within the bilateral basal ganglia/internal capsules, thalami and cerebellar hemispheres. As before, there are a few scattered chronic microhemorrhages within the bilateral cerebral hemispheres. There is no acute infarct. No extra-axial fluid collection. No midline shift. Vascular: Maintained flow voids within the proximal large arterial vessels. Skull and upper cervical spine: Right frontoparietal cranioplasty. A 6 mm focus of enhancement within the right temporoparietal calvarium is new from the brain MRI of 09/30/2020, and highly suspicious for an osseous  metastasis (series 19, image 106). Sinuses/Orbits: Unchanged prominent orbital fat bilaterally with bilateral proptosis and medial bulging of the right lamina papyracea in this patient with a history of thyroid eye disease. No significant paranasal sinus disease. Other: Stable focus of T2 hyperintense signal abnormality within the left parotid gland. The possibility of a vascular malformation at this site is again raised. IMPRESSION: 4 mm enhancing cortical metastasis within the anterior left frontal lobe, new from the brain MRI of 09/30/2020. 6 mm focus of enhancement within the right temporoparietal calvarium, also new from the prior exam, and highly suspicious for an osseous metastasis. Stable appearance of posttreatment changes within the right frontal lobe. Unchanged small focus of cortical T2/FLAIR hyperintense signal abnormality within the right temporoparietal junction. This could reflect a small chronic focus of cortical encephalomalacia, although no definite volume loss is appreciated at this site. Continued attention recommended on follow-up. Electronically Signed   By: Kellie Simmering D.O.   On: 01/30/2021 18:08     Assessment/Plan Brain  metastasis ([place]) [C79.31]  Jacqueline Palmer is clinically stable Palmer.  Brain MRI demonstrates breakthrough L frontal metasasis, asymptomatic.    We recommended treating with radiosurgery, after discussion in brain/spine tumor board meeting this week.  She is agreeable with this plan.  For metastases in general and calvarial met, she will meet with Dr. Alen Blew later this month following restaging CT scans.  Recommended continuing Keppra at 511m BID.  Ok with trial of alternate neuropathic pain drug if she's interested.  We appreciate the opportunity to participate in the care of Jacqueline Palmer    We ask that AMeagen Limonesreturn to clinic in 3 months following next brain MRI, or sooner as needed.  All questions were answered. The patient knows to call the clinic with any problems, questions or concerns. No barriers to learning were detected.  The total time spent in the encounter was 30 minutes and more than 50% was on counseling and review of test results   ZVentura Sellers MD Medical Director of Neuro-Oncology [facility] WOasis09/13/22 11:56 Jacqueline

## 2021-02-02 ENCOUNTER — Ambulatory Visit
Admission: RE | Admit: 2021-02-02 | Discharge: 2021-02-02 | Disposition: A | Discharge status: Home / Self Care | Payer: Medicare Other | Source: Ambulatory Visit | Attending: Radiation Oncology | Admitting: Radiation Oncology

## 2021-02-02 ENCOUNTER — Encounter: Payer: Self-pay | Admitting: Radiation Oncology

## 2021-02-02 ENCOUNTER — Ambulatory Visit: Payer: Medicare Other

## 2021-02-02 VITALS — BP 116/78 | HR 70 | Temp 97.5°F | Resp 20 | Ht 68.0 in | Wt 190.0 lb

## 2021-02-02 DIAGNOSIS — I429 Cardiomyopathy, unspecified: Secondary | ICD-10-CM | POA: Insufficient documentation

## 2021-02-02 DIAGNOSIS — I509 Heart failure, unspecified: Secondary | ICD-10-CM | POA: Insufficient documentation

## 2021-02-02 DIAGNOSIS — I110 Hypertensive heart disease with heart failure: Secondary | ICD-10-CM | POA: Insufficient documentation

## 2021-02-02 DIAGNOSIS — Z510 Encounter for antineoplastic radiation therapy: Secondary | ICD-10-CM | POA: Diagnosis not present

## 2021-02-02 DIAGNOSIS — C641 Malignant neoplasm of right kidney, except renal pelvis: Secondary | ICD-10-CM | POA: Insufficient documentation

## 2021-02-02 DIAGNOSIS — E079 Disorder of thyroid, unspecified: Secondary | ICD-10-CM | POA: Diagnosis not present

## 2021-02-02 DIAGNOSIS — C7931 Secondary malignant neoplasm of brain: Secondary | ICD-10-CM | POA: Insufficient documentation

## 2021-02-02 DIAGNOSIS — Z87891 Personal history of nicotine dependence: Secondary | ICD-10-CM | POA: Diagnosis not present

## 2021-02-02 DIAGNOSIS — I4891 Unspecified atrial fibrillation: Secondary | ICD-10-CM | POA: Diagnosis not present

## 2021-02-02 DIAGNOSIS — Z923 Personal history of irradiation: Secondary | ICD-10-CM | POA: Diagnosis not present

## 2021-02-02 DIAGNOSIS — Z803 Family history of malignant neoplasm of breast: Secondary | ICD-10-CM | POA: Diagnosis not present

## 2021-02-02 DIAGNOSIS — Z79899 Other long term (current) drug therapy: Secondary | ICD-10-CM | POA: Insufficient documentation

## 2021-02-02 DIAGNOSIS — I2510 Atherosclerotic heart disease of native coronary artery without angina pectoris: Secondary | ICD-10-CM | POA: Insufficient documentation

## 2021-02-02 DIAGNOSIS — Z7901 Long term (current) use of anticoagulants: Secondary | ICD-10-CM | POA: Insufficient documentation

## 2021-02-02 NOTE — Progress Notes (Signed)
Radiation Oncology         [phone] ________________________________  Name: Jacqueline Palmer        MRN: [id number]  Date of Service: 02/02/2021 DOB: Aug 17, 1945  [id number], Dalbert Batman, MD  Wyatt Portela, MD     REFERRING PHYSICIAN: Wyatt Portela, MD   DIAGNOSIS: The primary encounter diagnosis was Renal cell carcinoma of right kidney [place] A diagnosis of Brain metastasis ([place]) was also pertinent to this visit.   HISTORY OF PRESENT ILLNESS: Jacqueline Palmer is a 75 y.o. female with a history of metastatic renal cell carcinoma. She was scheduled for right nephrectomy on August 04, 2019, however came into the emergency room at [facility] on 07/24/2019 with a new onset of seizures.  CT scan in the emergency department revealed a 3.1 x 2 cm mass in her right frontal lobe concerning for metastatic disease versus a separate primary.  She had a mandibular dislocation and subluxation that was reduced.  Further CT imaging showed 3 nodules in the right lung, and she was taken to the operating room on 07/30/2019 for right frontal craniotomy.  Final pathology revealed a metastatic carcinoma consistent with a metastatic renal cell carcinoma.  She met with medical oncology following her recovery, and underwent postop SRS treatment with Dr. Tyrone Nine at [facility] in [place] which she completed in May 2021.  It appeared that she did not have any other further disease, so she did also undergo right nephrectomy on 10/16/2019 revealing a renal cell carcinoma.  1 node was sampled and was negative.  It is unclear what her treatment plan was going to be following this, however she got readmitted due to hydropneumothorax in June 2021 requiring chest tube.     She has relocated her care to [facility] where she had resided prior to her diagnosis being made.  She has established her care with Dr. Alen Blew. She was treated in October 2021 with salvage fractionated SRS to the surgical cavity. She has been followed in the brain  oncology program and with Dr. Alen Blew in surveillance. She had recent MRI on 01/28/21 that showed a 4 mm lesion in the left frontal lobe and a 6 mm focus of enhancement in the right temporoparietal calvarium. She's seen today to discuss SRS to this frontal lobe site.    PREVIOUS RADIATION THERAPY: Yes    03/03/20-03/09/20 Salvage SRS: PTV1: Right Frontal 30 mm target was treated to 27 Gy in 3 fractions:  May 2021 Postop SRS with Dr. Tyrone Nine in [facility]: The patient received 27.5 Gy in 5 fractions to the right frontal lobe surgical cavity.   PAST MEDICAL HISTORY:  Past Medical History:  Diagnosis Date   Acute kidney injury ([place])    Atrial fibrillation ([place])    Cardiomyopathy ([place])    CHF (congestive heart failure) ([place])    Chronic a-fib ([place]) 08/14/2016   Chronic anticoagulation 08/14/2016   Coronary artery disease    4v CABG 6/17   Hypertension    MVR 34m Edwards Perimount Plus Bioprosthetic 11/17/2015   SN [id number] [From patient's surgical info card]   Thrombus 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 myocardial infarction    Thyroid disease        PAST SURGICAL HISTORY: Past Surgical History:  Procedure Laterality Date   ABDOMINAL HYSTERECTOMY     CORONARY ARTERY BYPASS GRAFT     CRANIOTOMY Right 07/30/2019   Right frontal   NEPHRECTOMY Right 10/16/2019     FAMILY HISTORY:  Family  History  Problem Relation Age of Onset   Heart attack Mother    Heart attack Father    Heart attack Sister    Breast cancer Neg Hx      SOCIAL HISTORY:  reports that she quit smoking about 6 years ago. Her smoking use included cigarettes. She has never used smokeless tobacco. She reports current alcohol use. She reports that she does not use drugs. The patient is widowed and lives in [place]. 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    ALLERGIES: Avocado, Banana, Plantain, Ace inhibitors, Codeine, Prednisone, Tape,  and Latex   MEDICATIONS:  Current Outpatient Medications  Medication Sig Dispense Refill   acetaminophen (TYLENOL) 500 MG tablet Take 1,000 mg by mouth in the morning and at bedtime.     colchicine 0.6 MG tablet Take 1 tablet (0.6 mg total) by mouth at bedtime. 90 tablet 3   hydrALAZINE (APRESOLINE) 10 MG tablet TAKE ONE TABLET BY MOUTH THREE TIMES DAILY 270 tablet 1   levETIRAcetam (KEPPRA) 500 MG tablet Take 1 tablet (500 mg total) by mouth 2 (two) times daily. 60 tablet 0   levothyroxine (SYNTHROID) 25 MCG tablet TAKE ONE TABLET BY MOUTH ONE TIME DAILY 30 to 60 minutes before breakfast on an empty stomach with a full glass of water 90 tablet 2   metoprolol succinate (TOPROL-XL) 25 MG 24 hr tablet Take 1 tablet (25 mg total) by mouth daily. 90 tablet 3   nitroGLYCERIN (NITROSTAT) 0.4 MG SL tablet Place 1 tablet (0.4 mg total) under the tongue every 5 (five) minutes as needed for chest pain. 25 tablet 3   polyethylene glycol (MIRALAX / GLYCOLAX) 17 g packet Take 17 g by mouth daily as needed for mild constipation.     pravastatin (PRAVACHOL) 20 MG tablet Take 20 mg by mouth at bedtime.     spironolactone (ALDACTONE) 25 MG tablet Take 1 tablet (25 mg total) by mouth daily. 90 tablet 3   torsemide (DEMADEX) 20 MG tablet TAKE TWO TABLETS BY MOUTH IN THE MORNING AND ONE IN THE EVENING 90 tablet 3   warfarin (COUMADIN) 5 MG tablet Take 1 tablet (5 mg total) by mouth every evening. 90 tablet 1   No current facility-administered medications for this encounter.     REVIEW OF SYSTEMS: On review of systems, the patient reports that she is doing well. She denies any seizures in the last year, any headaches, visual or auditory changes. She continues to work with PT and OT at her long term care facility and has been moved several times but currently unpacking her apartment after a recent move. No other complaints are verbalized.   PHYSICAL EXAM:  Wt Readings from Last 3 Encounters:  02/02/21 190 lb  (86.2 kg)  02/02/21 190 lb (86.2 kg)  02/01/21 190 lb 8 oz (86.4 kg)   Temp Readings from Last 3 Encounters:  02/02/21 (!) 97.5 F (36.4 C) (Temporal)  02/02/21 (!) 97.5 F (36.4 C)  02/01/21 (!) 97.2 F (36.2 C) (Tympanic)   BP Readings from Last 3 Encounters:  02/02/21 116/78  02/02/21 116/78  02/01/21 115/82   Pulse Readings from Last 3 Encounters:  02/02/21 70  02/02/21 70  02/01/21 96   Pain Assessment Pain Score: 2  (multiple joints)/10  In general this is a well appearing African American female in no acute distress. She's alert and oriented x4 and appropriate throughout the examination. Cardiopulmonary assessment is negative for  acute distress and she exhibits normal effort.     ECOG = 1  0 - Asymptomatic (Fully active, able to carry on all predisease activities without restriction)  1 - Symptomatic but completely ambulatory (Restricted in physically strenuous activity but ambulatory and able to carry out work of a light or sedentary nature. For example, light housework, office work)  2 - Symptomatic, <50% in bed during the day (Ambulatory and capable of all self care but unable to carry out any work activities. Up and about more than 50% of waking hours)  3 - Symptomatic, >50% in bed, but not bedbound (Capable of only limited self-care, confined to bed or chair 50% or more of waking hours)  4 - Bedbound (Completely disabled. Cannot carry on any self-care. Totally confined to bed or chair)  5 - Death   Eustace Pen MM, Creech RH, Tormey DC, et al. [phone]). "Toxicity and response criteria of the [facility]". [place] Oncol. 5 (6): 649-55    LABORATORY DATA:  Lab Results  Component Value Date   WBC 6.3 02/01/2021   HGB 12.5 02/01/2021   HCT 38.9 02/01/2021   MCV 73.1 (L) 02/01/2021   PLT 255 02/01/2021   Lab Results  Component Value Date   NA 137 02/01/2021   K 4.2 02/01/2021   CL 101 02/01/2021   CO2 24 02/01/2021   Lab Results   Component Value Date   ALT 16 02/01/2021   AST 21 02/01/2021   ALKPHOS 88 02/01/2021   BILITOT 0.8 02/01/2021      RADIOGRAPHY: MR BRAIN W WO CONTRAST  Result Date: 01/30/2021 CLINICAL DATA:  Brain metastasis. Brain/CNS neoplasm, assess treatment response. Additional history obtained from prior radiology records: Metastatic renal cell carcinoma, prior resection of a solitary right frontal brain metastasis in March 2021 followed by postoperative [facility] May 2021 and subsequent salvage [facility] October 2021. EXAM: MRI HEAD WITHOUT AND WITH CONTRAST TECHNIQUE: Multiplanar, multiecho pulse sequences of the brain and surrounding structures were obtained without and with intravenous contrast. CONTRAST:  36m GADAVIST GADOBUTROL 1 MMOL/ML IV SOLN COMPARISON:  Prior brain MRI examinations 09/30/2020 and earlier. FINDINGS: Brain: Mild generalized cerebral and cerebellar atrophy. New from the prior brain MRI of 07/31/2020, there is a 4 mm enhancing cortical metastasis within the anterior left frontal lobe (series 19, image 94). Redemonstrated resection cavity within the high posterior right frontal lobe with unchanged non-nodular peripheral enhancement and mild adjacent postoperative dural thickening. As before, chronic hemosiderin deposition is present at this site. Surrounding T2/FLAIR hyperintense signal abnormality has not significantly changed. Mild ex vacuo dilatation of the right lateral ventricle. Unchanged small focus of cortical T2/FLAIR hyperintense signal abnormality at the right frontoparietal junction (series 9, image 39) series 17, image 21). This may reflect a small focus of chronic cortical encephalomalacia, although no definite volume loss is appreciated at this site. Background moderate multifocal T2/FLAIR hyperintensity within the cerebral white matter, nonspecific but compatible with chronic small vessel ischemic disease. Mild chronic small-vessel ischemic changes are also present within the pons.  Redemonstrated chronic lacunar infarcts within the bilateral basal ganglia/internal capsules, thalami and cerebellar hemispheres. As before, there are a few scattered chronic microhemorrhages within the bilateral cerebral hemispheres. There is no acute infarct. No extra-axial fluid collection. No midline shift. Vascular: Maintained flow voids within the proximal large arterial vessels. Skull and upper cervical spine: Right frontoparietal cranioplasty. A 6 mm focus of enhancement within the right temporoparietal calvarium is new from the brain MRI of  09/30/2020, and highly suspicious for an osseous metastasis (series 19, image 106). Sinuses/Orbits: Unchanged prominent orbital fat bilaterally with bilateral proptosis and medial bulging of the right lamina papyracea in this patient with a history of thyroid eye disease. No significant paranasal sinus disease. Other: Stable focus of T2 hyperintense signal abnormality within the left parotid gland. The possibility of a vascular malformation at this site is again raised. IMPRESSION: 4 mm enhancing cortical metastasis within the anterior left frontal lobe, new from the brain MRI of 09/30/2020. 6 mm focus of enhancement within the right temporoparietal calvarium, also new from the prior exam, and highly suspicious for an osseous metastasis. Stable appearance of posttreatment changes within the right frontal lobe. Unchanged small focus of cortical T2/FLAIR hyperintense signal abnormality within the right temporoparietal junction. This could reflect a small chronic focus of cortical encephalomalacia, although no definite volume loss is appreciated at this site. Continued attention recommended on follow-up. Electronically Signed   By: Kellie Simmering D.O.   On: 01/30/2021 18:08        IMPRESSION/PLAN: 1. 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  Dr. Lisbeth Renshaw discusses the patient's course since her last radiation and reviews the rationale to  treat the 4 mm lesion in the frontal lobe with SRS in a single fraction. We discussed the risks, benefits, short, and long term effects of radiotherapy, as well as the curative intent, and the patient is interested in proceeding. Dr. Lisbeth Renshaw discusses the delivery and logistics of radiotherapy and anticipates a course of a single treatment which is getting scheduled. She will simulate today. Written consent is obtained and placed in the chart, a copy was provided to the patient. She will follow up with Dr. Alen Blew after restaging scans in about 2 weeks as well.    In a visit lasting 45 minutes, greater than 50% of the time was spent face to face discussing the patient's condition, in preparation for the discussion, and coordinating the patient's care.   The above documentation reflects my direct findings during this shared patient visit. Please see the separate note by Dr. Lisbeth Renshaw on this date for the remainder of the patient's plan of care.    Carola Rhine, PAC

## 2021-02-02 NOTE — Progress Notes (Signed)
Patient reports mild fatigue, and some possible skin lightning around eyes/ cheekbones. No other symptoms to report at this time.  Meaningful use complete.  BP 116/78 (BP Location: Right Arm, Patient Position: Sitting, Cuff Size: Large)   Pulse 70   Temp (!) 97.5 F (36.4 C) (Temporal)   Resp 20   Ht 5\' 8"  (1.727 m)   Wt 190 lb (86.2 kg)   SpO2 100%   BMI 28.89 kg/m

## 2021-02-03 DIAGNOSIS — Z510 Encounter for antineoplastic radiation therapy: Secondary | ICD-10-CM | POA: Diagnosis not present

## 2021-02-04 ENCOUNTER — Other Ambulatory Visit: Payer: Medicare Other

## 2021-02-04 ENCOUNTER — Other Ambulatory Visit: Payer: Self-pay | Admitting: *Deleted

## 2021-02-08 ENCOUNTER — Ambulatory Visit
Admission: RE | Admit: 2021-02-08 | Discharge: 2021-02-08 | Disposition: A | Discharge status: Home / Self Care | Payer: Medicare Other | Source: Ambulatory Visit | Attending: Radiation Oncology | Admitting: Radiation Oncology

## 2021-02-08 ENCOUNTER — Ambulatory Visit: Payer: Medicare Other

## 2021-02-08 ENCOUNTER — Encounter: Payer: Self-pay | Admitting: Radiation Oncology

## 2021-02-08 ENCOUNTER — Other Ambulatory Visit: Payer: Self-pay

## 2021-02-08 DIAGNOSIS — Z510 Encounter for antineoplastic radiation therapy: Secondary | ICD-10-CM | POA: Diagnosis not present

## 2021-02-08 NOTE — Progress Notes (Signed)
Jacqueline Palmer rested with [name] for 30 minutes following her [place] treatment.  Patient denies headache, dizziness, nausea, diplopia or ringing in the ears. Denies fatigue. Patient without complaints. Understands to avoid strenuous activity for the next 24 hours and call [phone] with needs.    BP 117/80 (BP Location: Right Arm, Patient Position: Standing, Cuff Size: Large)   Pulse 70   Temp 97.8 F (36.6 C)   Resp 20   SpO2 100%    Jacqueline Palmer, BSN

## 2021-02-10 ENCOUNTER — Ambulatory Visit (HOSPITAL_COMMUNITY)
Admission: RE | Admit: 2021-02-10 | Discharge: 2021-02-10 | Disposition: A | Discharge status: Home / Self Care | Payer: Medicare Other | Source: Ambulatory Visit | Attending: Oncology | Admitting: Oncology

## 2021-02-10 DIAGNOSIS — R918 Other nonspecific abnormal finding of lung field: Secondary | ICD-10-CM | POA: Insufficient documentation

## 2021-02-10 DIAGNOSIS — C649 Malignant neoplasm of unspecified kidney, except renal pelvis: Secondary | ICD-10-CM | POA: Diagnosis not present

## 2021-02-10 DIAGNOSIS — N2889 Other specified disorders of kidney and ureter: Secondary | ICD-10-CM | POA: Insufficient documentation

## 2021-02-10 IMAGING — CT CT ABD-PELV W/O CM
2 of 4 series · 13 of 36 positions shown, 16 images · non-contrast
Comparison: [DATE].

CLINICAL DATA: Urologic cancer staging in a 75-year-old female.
History of renal cell carcinoma without signs of metastasis on
previous imaging.

EXAM:
CT CHEST, ABDOMEN AND PELVIS WITHOUT CONTRAST
TECHNIQUE: Multidetector CT imaging of the chest, abdomen and pelvis was
performed following the standard protocol without IV contrast.

[Series 2: cap w/o · axial · non-contrast · 0.91mm/px · z∈[+1200,+1705]mm · 10 of 123 slices shown, 13 images]
[im 11/123  mediastinal]
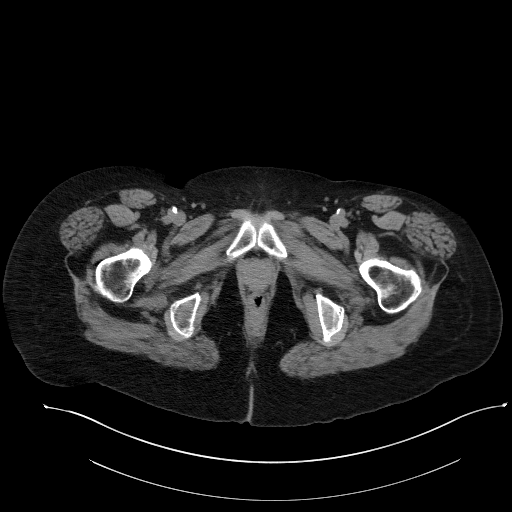
[im 11/123  lung]
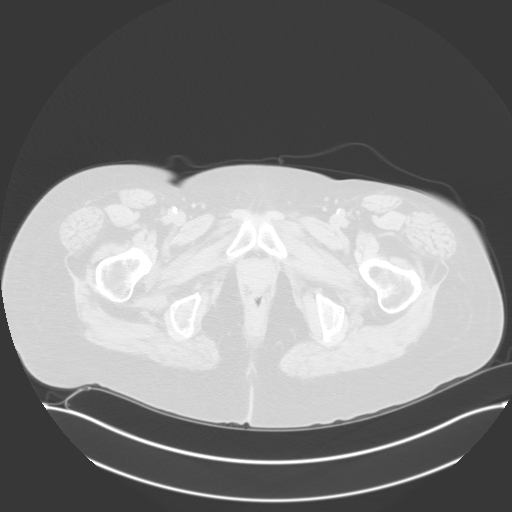
[im 21/123  lung]
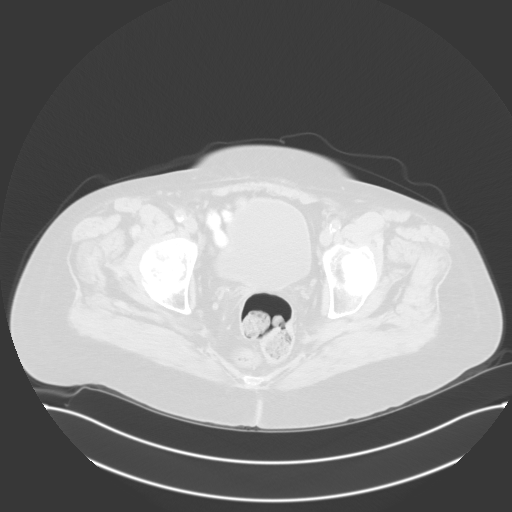
[im 31/123  lung]
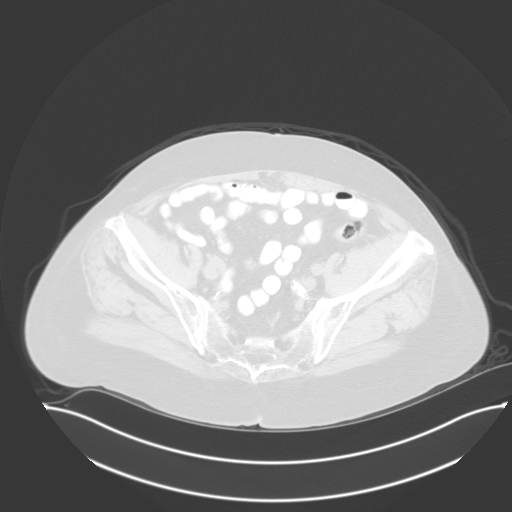
[im 41/123  lung]
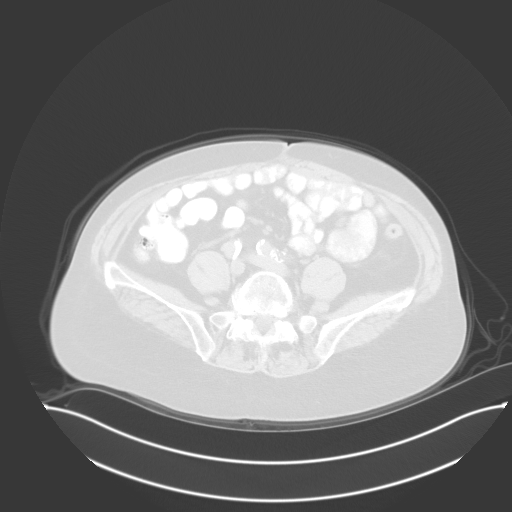
[im 51/123  mediastinal]
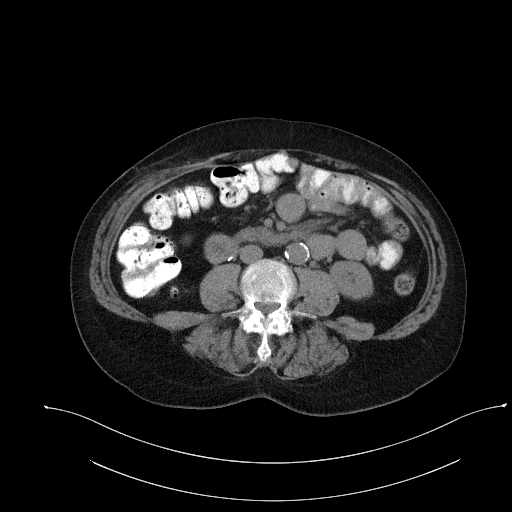
[im 51/123  lung]
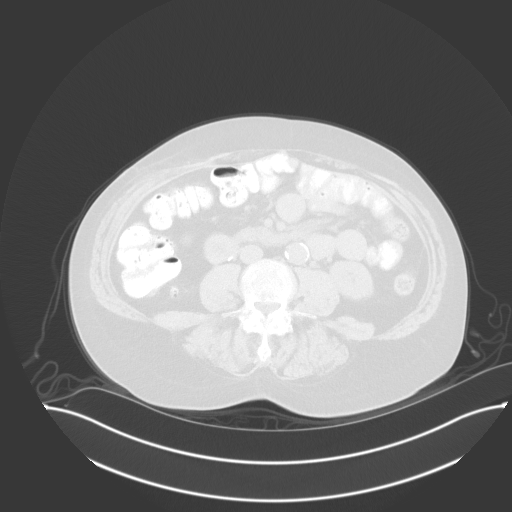
[im 72/123  lung]
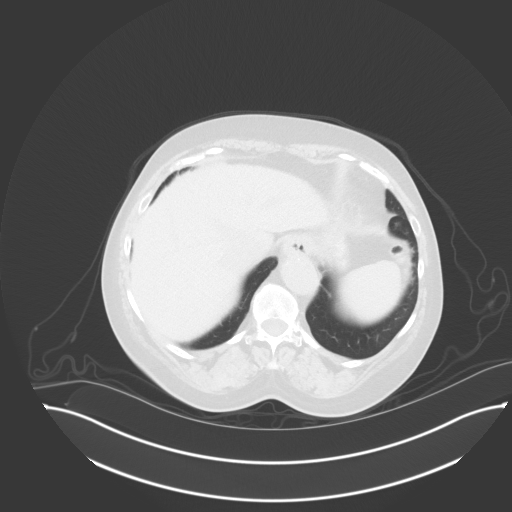
[im 82/123  lung]
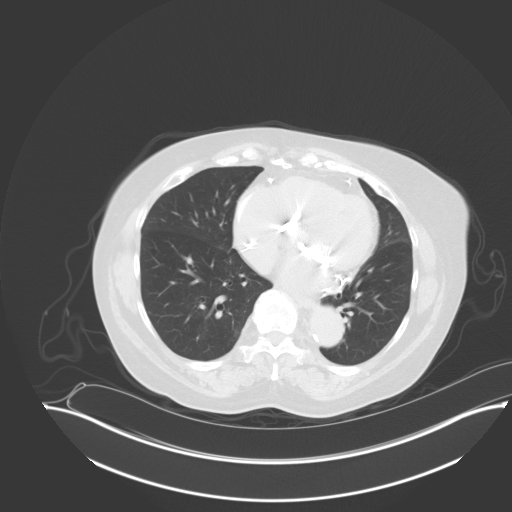
[im 92/123  lung]
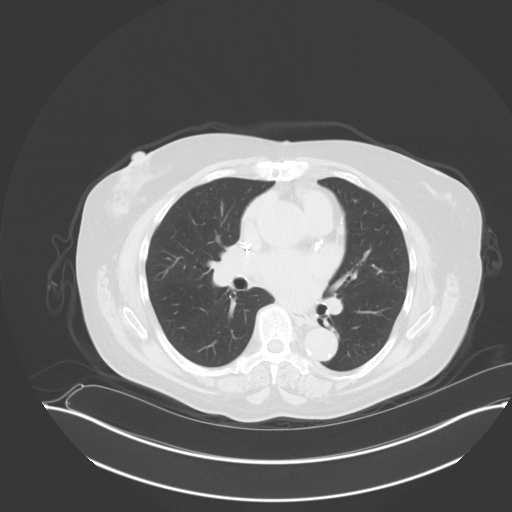
[im 102/123  mediastinal]
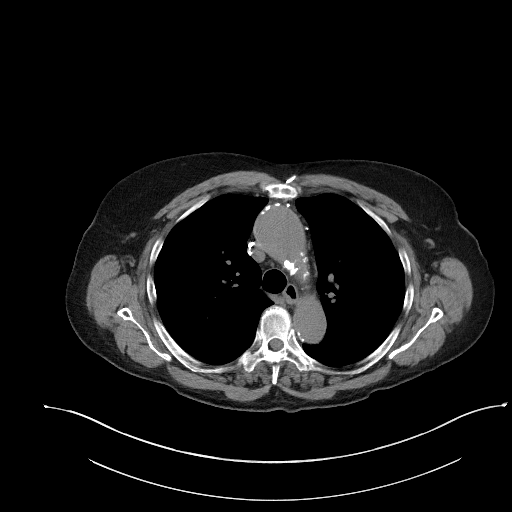
[im 102/123  lung]
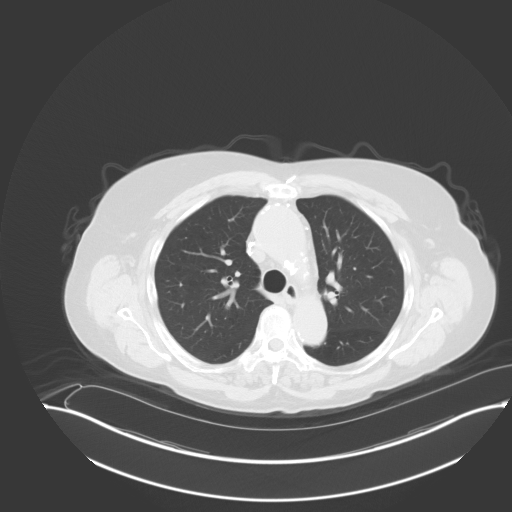
[im 112/123  lung]
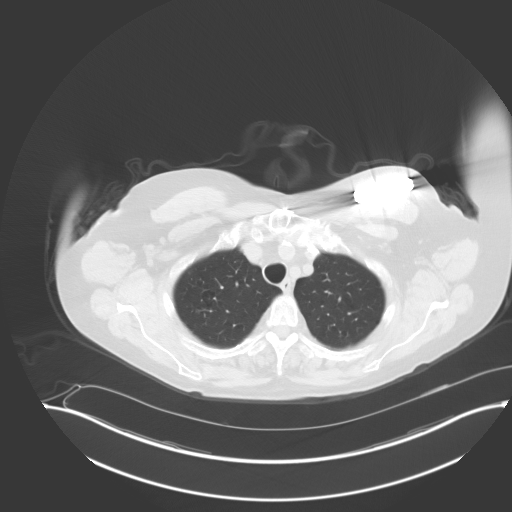

[Series 5: coronals · coronal · 0.82mm/px · 3 of 137 slices shown]
[im 28/137  lung]
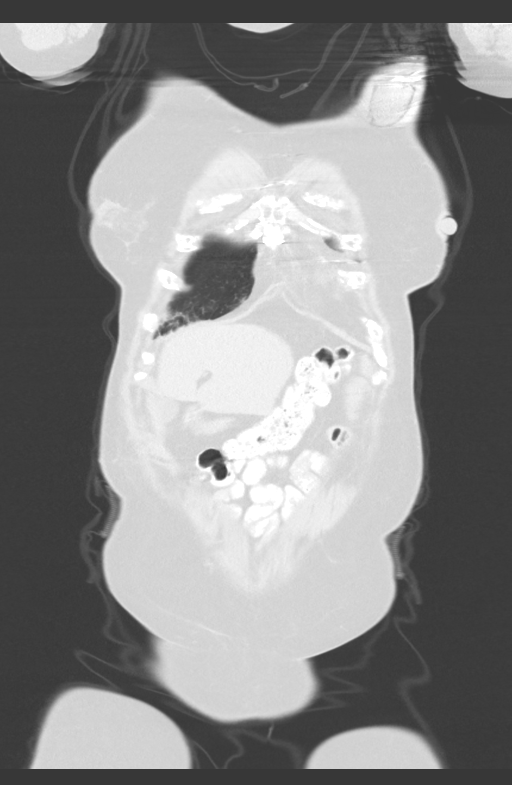
[im 55/137  lung]
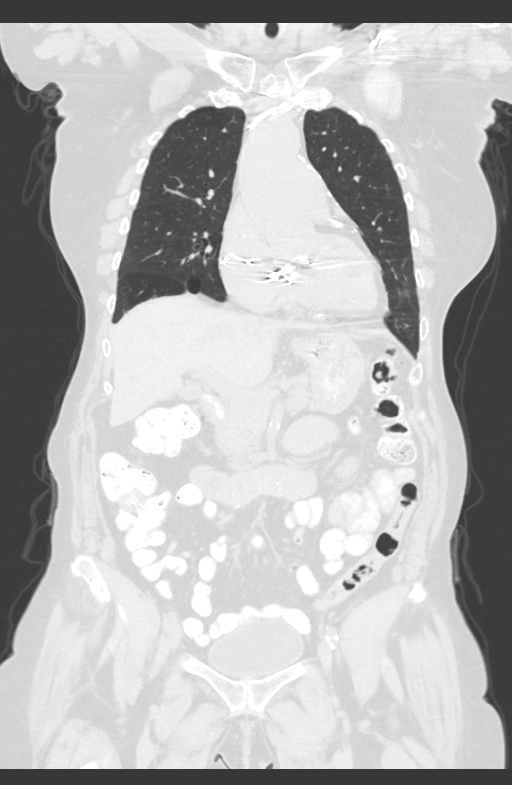
[im 82/137  lung]
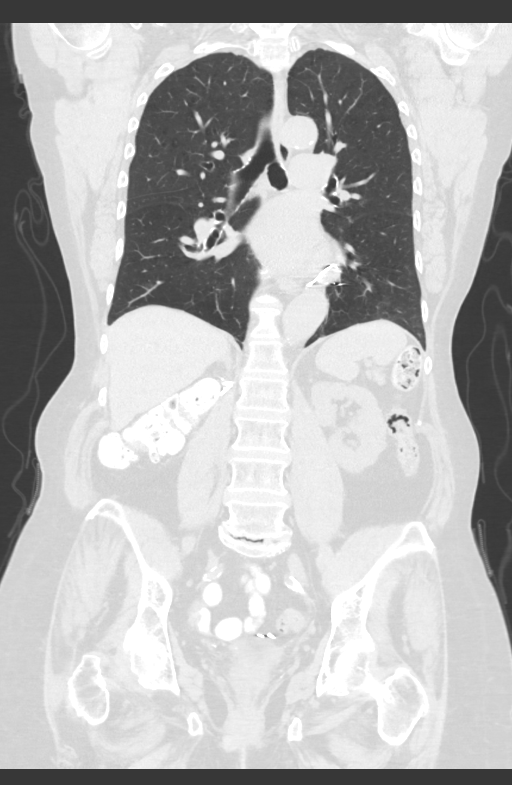

[13 of 36 positions shown; findings below may reference images not displayed]

FINDINGS: CT CHEST FINDINGS

Cardiovascular: Multi lead LEFT-sided pacer defibrillator with
mitral valve replacement. Heart size is stable. No substantial
pericardial effusion. Signs of coronary artery disease and CABG.
cm ascending thoracic aortic caliber. Generalized atherosclerosis of
the thoracic aorta. No signs of adenopathy. Central pulmonary
vasculature is unremarkable. Limited assessment of cardiovascular
structures given lack of intravenous contrast.

Mediastinum/Nodes: Mildly patulous esophagus. No adenopathy in the
chest.

Lungs/Pleura: Scattered tiny pulmonary nodules in the chest without
interval change in the RIGHT upper lobe in particular. No effusion.
Airways are patent.

Musculoskeletal: See below for full musculoskeletal details.

CT ABDOMEN PELVIS FINDINGS

Hepatobiliary: Liver with smooth contours, no visible lesion. No
pericholecystic stranding.

Pancreas: Normal contours.  No signs of adjacent inflammation.

Spleen: Spleen normal size and contour.  No signs of inflammation.

Adrenals/Urinary Tract: Post RIGHT nephrectomy. Adrenal glands are
normal. LEFT kidney with stable appearance, no hydronephrosis or
contour changes. Smooth contour the urinary bladder without signs of
adjacent stranding.

Stomach/Bowel: Small hiatal hernia. No acute bowel process. Normal
appendix.

Vascular/Lymphatic: Calcified atheromatous plaque of the abdominal
aorta. Postoperative changes about the IVC with similar appearance
to prior imaging. No aneurysmal dilation of the abdominal aorta.
There is no gastrohepatic or hepatoduodenal ligament
lymphadenopathy. No retroperitoneal or mesenteric lymphadenopathy.

No pelvic sidewall lymphadenopathy.

Reproductive: Post hysterectomy.  No adnexal masses.

Other: No ascites.  No nodularity along the body wall.

Musculoskeletal: Spinal degenerative changes. No acute or
destructive bone finding. Signs of avascular necrosis of the femoral
heads RIGHT greater than LEFT. Now with collapse of the
weight-bearing surface of the RIGHT femoral head which was not
demonstrated on previous imaging. Post median sternotomy.
IMPRESSION: Post RIGHT nephrectomy. No evidence of metastatic disease to the
chest, abdomen or pelvis.

Signs of avascular necrosis of bilateral femoral heads with interval
collapse of the superior aspect of RIGHT femoral head, correlate
with worsening pain and consider further imaging or management as
warranted.

Scattered tiny pulmonary nodules are unchanged.

Small hiatal hernia.

Aortic dilation greater than 4 cm, unchanged. Attention on
follow-up or dedicated follow-up in 1 year as warranted.

Aortic Atherosclerosis ([ND]-[ND]).

## 2021-02-11 ENCOUNTER — Inpatient Hospital Stay (HOSPITAL_BASED_OUTPATIENT_CLINIC_OR_DEPARTMENT_OTHER): Payer: Medicare Other | Admitting: Oncology

## 2021-02-11 ENCOUNTER — Telehealth: Payer: Self-pay | Admitting: *Deleted

## 2021-02-11 ENCOUNTER — Other Ambulatory Visit: Payer: Self-pay

## 2021-02-11 VITALS — BP 112/77 | HR 71 | Temp 96.4°F | Resp 17 | Ht 68.0 in | Wt 191.5 lb

## 2021-02-11 DIAGNOSIS — C649 Malignant neoplasm of unspecified kidney, except renal pelvis: Secondary | ICD-10-CM

## 2021-02-11 DIAGNOSIS — Z510 Encounter for antineoplastic radiation therapy: Secondary | ICD-10-CM | POA: Diagnosis not present

## 2021-02-11 DIAGNOSIS — C7931 Secondary malignant neoplasm of brain: Secondary | ICD-10-CM | POA: Diagnosis not present

## 2021-02-11 NOTE — Telephone Encounter (Signed)
PC to patient - Per Dr. Alen Blew, Ct scan does not show cancer.  Patient verbalizes understanding.

## 2021-02-11 NOTE — Progress Notes (Signed)
Hematology and Oncology Follow Up Visit  Jacqueline Palmer [id number] 11-16-1945 75 y.o. 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 ber 2020.  She developed a stage IV disease with isolated brain metastasis that was documented in January 2021.    Prior Therapy:  He status post right frontal craniotomy and resection of a solitary brain metastasis in March 2021.  He received postoperative SRS at [facility].  He status post a radical nephrectomy on Oct 16, 2019 with a T3a N0 disease.  The final pathology showed undetermined subtype with nuclear grade 3.  She is status post repeat stereotactic radiosurgery under the care of by Dr. Lisbeth Renshaw and Dr. Kathyrn Sheriff on March 03, 2020.  This was completed after she developed a 2.0 cm mass of the right frontal lobe indicating recurrent disease on MRI on February 03, 2020.  She is status post stereotactic radiosurgery under the care of Dr. Lisbeth Renshaw completed on February 08, 2021 for an isolated left frontal lobe detected in September 2022 and MRI.  Current therapy: Active surveillance.  Interim History: [name] returns today for a follow-up visit.  Since the last visit, she has completed additional stereotactic radiosurgery under the care of Dr. Lisbeth Renshaw on September 20 for an isolated left frontal lobe metastasis.  Clinically, she reports no neurological symptoms at this time.  She denies any headaches, blurry vision or syncope.  Her mobility has been limited related to arthritis and ambulating short distances with the help of a cane.  She denies any falls or syncope.  She does report arthritic pain with mobility.     Medications: Reviewed without changes. Current Outpatient Medications  Medication Sig Dispense Refill   acetaminophen (TYLENOL) 500 MG tablet Take 1,000 mg by mouth  in the morning and at bedtime.     colchicine 0.6 MG tablet Take 1 tablet (0.6 mg total) by mouth at bedtime. 90 tablet 3   hydrALAZINE (APRESOLINE) 10 MG tablet TAKE ONE TABLET BY MOUTH THREE TIMES DAILY 270 tablet 1   levETIRAcetam (KEPPRA) 500 MG tablet Take 1 tablet (500 mg total) by mouth 2 (two) times daily. 60 tablet 0   levothyroxine (SYNTHROID) 25 MCG tablet TAKE ONE TABLET BY MOUTH ONE TIME DAILY 30 to 60 minutes before breakfast on an empty stomach with a full glass of water 90 tablet 2   metoprolol succinate (TOPROL-XL) 25 MG 24 hr tablet Take 1 tablet (25 mg total) by mouth daily. 90 tablet 3   nitroGLYCERIN (NITROSTAT) 0.4 MG SL tablet Place 1 tablet (0.4 mg total) under the tongue every 5 (five) minutes as needed for chest pain. 25 tablet 3   polyethylene glycol (MIRALAX / GLYCOLAX) 17 g packet Take 17 g by mouth daily as needed for mild constipation.     pravastatin (PRAVACHOL) 20 MG tablet Take 20 mg by mouth at bedtime.     spironolactone (ALDACTONE) 25 MG tablet Take 1 tablet (25 mg total) by mouth daily. 90 tablet 3   torsemide (DEMADEX) 20 MG tablet TAKE TWO TABLETS BY MOUTH IN THE MORNING AND ONE IN THE EVENING 90 tablet 3   warfarin (COUMADIN) 5 MG tablet Take 1 tablet (5 mg total) by mouth every evening. 90 tablet 1   No current facility-administered medications for this visit.     Allergies:  Allergies  Allergen Reactions   Avocado Shortness  Of Breath    Before 2007   Banana Anaphylaxis and Swelling   Plantain Anaphylaxis and Swelling   Ace Inhibitors Cough   Codeine Other (See Comments)    Nausea and Feels Jittery   Prednisone Swelling    Of face and lower extremities   Tape Hives    Tape  Does better with paper tape  Tape  Does better with paper tape     Latex Rash      Physical Exam:  Blood pressure 112/77, pulse 71, temperature (!) 96.4 F (35.8 C), temperature source Oral, resp. rate 17, height 5\' 8"  (1.727 m), weight 191 lb 8 oz (86.9 kg),  SpO2 100 %.   ECOG: 1   General appearance: Comfortable appearing without any discomfort Head: Normocephalic without any trauma Oropharynx: Mucous membranes are moist and pink without any thrush or ulcers. Eyes: Pupils are equal and round reactive to light. Lymph nodes: No cervical, supraclavicular, inguinal or axillary lymphadenopathy.   Heart:regular rate and rhythm.  S1 and S2 without leg edema. Lung: Clear without any rhonchi or wheezes.  No dullness to percussion. Abdomin: Soft, nontender, nondistended with good bowel sounds.  No hepatosplenomegaly. Musculoskeletal: No joint deformity or effusion.  Full range of motion noted. Neurological: No deficits noted on motor, sensory and deep tendon reflex exam. Skin: No petechial rash or dryness.  Appeared moist.        Lab Results: Lab Results  Component Value Date   WBC 6.3 02/01/2021   HGB 12.5 02/01/2021   HCT 38.9 02/01/2021   MCV 73.1 (L) 02/01/2021   PLT 255 02/01/2021     Chemistry      Component Value Date/Time   NA 137 02/01/2021 1250   NA 141 12/14/2020 1053   K 4.2 02/01/2021 1250   CL 101 02/01/2021 1250   CO2 24 02/01/2021 1250   BUN 43 (H) 02/01/2021 1250   BUN 45 (H) 12/14/2020 1053   CREATININE 2.74 (H) 02/01/2021 1250      Component Value Date/Time   CALCIUM 10.1 02/01/2021 1250   ALKPHOS 88 02/01/2021 1250   AST 21 02/01/2021 1250   ALT 16 02/01/2021 1250   BILITOT 0.8 02/01/2021 1250      IMPRESSION: 4 mm enhancing cortical metastasis within the anterior left frontal lobe, new from the brain MRI of 09/30/2020.   6 mm focus of enhancement within the right temporoparietal calvarium, also new from the prior exam, and highly suspicious for an osseous metastasis.   Stable appearance of posttreatment changes within the right frontal lobe.   Unchanged small focus of cortical T2/FLAIR hyperintense signal abnormality within the right temporoparietal junction. This could reflect a small chronic  focus of cortical encephalomalacia, although no definite volume loss is appreciated at this site. Continued attention recommended on follow-up.   Impression and Plan:  75 year old woman with:   1.    Kidney cancer diagnosed in October 2020.  She was found to have stage IV papillary tumor involving the CNS.  The natural course of this disease and treatment options were reviewed at this time.  Given her papillary histology, systemic treatment is problematic in her case.  She does not have documented widespread disease (pending her CT scan from February 10, 2021) which makes systemic therapy difficult to justify.  Systemic treatment options were reviewed today in detail.  Oral targeted therapy with cabozantinib or in combination with nivolumab were discussed.  Single agent nivolumab could have some activity as well as combination nivolumab  with ipilimumab.   Risks and benefits of all of these combinations of drugs were discussed.  These are all weight against the complications and toxicities associated with that as well as her limited performance status and mobility.   After discussion today, I recommended continue to treat oligometastatic disease with local therapy and defer systemic therapy.  The rationale of doing so is the potential toxicities associated with the systemic therapy in late limited metastatic disease so far.  She understands if she has widespread metastatic disease, systemic therapy options would be introduced at that time.  The plan is to await the final results of from recent imaging studies on September 22.  If no widespread metastatic disease is noted, I recommended continued active surveillance.   2.  Renal insufficiency: Her kidney function remains at baseline.   3.  CNS metastasis: She is status post recent [facility] after MRI in September 2022.  She will continue to follow with Dr. Lisbeth Renshaw and Dr. Mickeal Skinner.    4.  Follow-up: In 3 months for repeat follow-up and in 6 months for  repeat imaging studies.   30  minutes were meant on this encounter.  Time was dedicated to reviewing imaging studies, disease status update, treatment choices as well as complications related to these therapies.      Zola Button, MD 9/23/20229:44 AM

## 2021-02-11 NOTE — Telephone Encounter (Signed)
-----   Message from Wyatt Portela, MD sent at 02/11/2021 12:27 PM EDT ----- Please let her know the scan did not show cancer like we discussed.

## 2021-02-18 ENCOUNTER — Other Ambulatory Visit: Payer: Self-pay | Admitting: Radiation Therapy

## 2021-02-21 NOTE — Progress Notes (Signed)
                                                                                                                                                             Patient Name: Jacqueline Palmer MRN: [id number] DOB: 05/23/1945 Referring Physician: Zola Button (Profile Not Attached) Date of Service: 02/08/2021  [facility] [place]                                                        End Of Treatment Note  Diagnoses: C79.31-Secondary malignant neoplasm of brain  Cancer Staging:  Stage IV renal cell carcinoma of the right kidney with brain metastases  Intent: Palliative  Radiation Treatment Dates: 02/08/2021 SRS Treatment: Site Technique Total Dose (Gy) Dose per Fx (Gy) Completed Fx Beam Energies  Brain: Brain PTV_2FrontLobe 8 mm 3D 20/20 20 1/1 [id number]   Narrative: The patient tolerated radiation therapy relatively well.   Plan: The patient will receive a call in about one month from the radiation oncology department. She will continue follow up with Dr. Alen Blew as well.   ________________________________________________    Carola Rhine, [facility]

## 2021-03-01 ENCOUNTER — Ambulatory Visit (INDEPENDENT_AMBULATORY_CARE_PROVIDER_SITE_OTHER): Payer: Medicare Other | Admitting: *Deleted

## 2021-03-01 ENCOUNTER — Other Ambulatory Visit: Payer: Self-pay

## 2021-03-01 DIAGNOSIS — Z7901 Long term (current) use of anticoagulants: Secondary | ICD-10-CM

## 2021-03-01 DIAGNOSIS — I482 Chronic atrial fibrillation, unspecified: Secondary | ICD-10-CM

## 2021-03-01 LAB — POCT INR: INR: 3.9 — AB (ref 2.0–3.0)

## 2021-03-01 NOTE — Patient Instructions (Signed)
Description   Hold warfarin today and then, continue with 1 tablet daily except 1.5 tablets each Monday and Friday.  Repeat INR in 3 weeks

## 2021-03-02 ENCOUNTER — Other Ambulatory Visit: Payer: Self-pay | Admitting: Internal Medicine

## 2021-03-02 NOTE — Telephone Encounter (Signed)
Requested Prescriptions  Pending Prescriptions Disp Refills  . hydrALAZINE (APRESOLINE) 10 MG tablet [Pharmacy Med Name: hydrALAZINE HCl Oral Tablet 10 MG] 270 tablet 2    Sig: TAKE ONE TABLET BY MOUTH THREE TIMES DAILY     Cardiovascular:  Vasodilators Failed - 03/02/2021  9:44 AM      Failed - RBC in normal range and within 360 days    RBC  Date Value Ref Range Status  02/01/2021 5.32 (H) 3.87 - 5.11 MIL/uL Final         Passed - HCT in normal range and within 360 days    HCT  Date Value Ref Range Status  02/01/2021 38.9 36.0 - 46.0 % Final         Passed - HGB in normal range and within 360 days    Hemoglobin  Date Value Ref Range Status  02/01/2021 12.5 12.0 - 15.0 g/dL Final         Passed - WBC in normal range and within 360 days    WBC  Date Value Ref Range Status  12/02/2020 7.3 4.0 - 10.5 K/uL Final   WBC Count  Date Value Ref Range Status  02/01/2021 6.3 4.0 - 10.5 K/uL Final         Passed - PLT in normal range and within 360 days    Platelet Count  Date Value Ref Range Status  02/01/2021 255 150 - 400 K/uL Final         Passed - Last BP in normal range    BP Readings from Last 1 Encounters:  02/11/21 112/77         Passed - Valid encounter within last 12 months    Recent Outpatient Visits          1 month ago Encounter for [employer] annual wellness exam   [place], Deborah B, MD   2 months ago Essential hypertension   [place], MD   6 months ago Essential hypertension   [place], MD   10 months ago Establishing care with new doctor, encounter for   [place], MD      Future Appointments            In 1 month [name], MD [place]   In 4 months Buford Dresser, MD [facility]

## 2021-03-03 ENCOUNTER — Ambulatory Visit (INDEPENDENT_AMBULATORY_CARE_PROVIDER_SITE_OTHER): Payer: Medicare Other

## 2021-03-03 DIAGNOSIS — I428 Other cardiomyopathies: Secondary | ICD-10-CM

## 2021-03-03 LAB — CUP PACEART REMOTE DEVICE CHECK
Battery Remaining Longevity: 60 mo
Battery Remaining Percentage: 70 %
Brady Statistic RA Percent Paced: 0 %
Brady Statistic RV Percent Paced: 100 %
Date Time Interrogation Session: 20221013000100
HighPow Impedance: 79 Ohm
Implantable Lead Implant Date: 20180423
Implantable Lead Implant Date: 20180423
Implantable Lead Implant Date: 20180423
Implantable Lead Location: 753858
Implantable Lead Location: 753859
Implantable Lead Location: 753860
Implantable Lead Model: 292
Implantable Lead Model: 4672
Implantable Lead Model: 7740
Implantable Lead Serial Number: 431793
Implantable Lead Serial Number: 602825
Implantable Lead Serial Number: 703305
Implantable Pulse Generator Implant Date: 20180423
Lead Channel Impedance Value: 1109 Ohm
Lead Channel Impedance Value: 503 Ohm
Lead Channel Impedance Value: 527 Ohm
Lead Channel Pacing Threshold Amplitude: 0.5 V
Lead Channel Pacing Threshold Amplitude: 0.6 V
Lead Channel Pacing Threshold Pulse Width: 0.4 ms
Lead Channel Pacing Threshold Pulse Width: 0.4 ms
Lead Channel Setting Pacing Amplitude: 1.6 V
Lead Channel Setting Pacing Amplitude: 2 V
Lead Channel Setting Pacing Pulse Width: 0.4 ms
Lead Channel Setting Pacing Pulse Width: 0.4 ms
Lead Channel Setting Sensing Sensitivity: 0.6 mV
Lead Channel Setting Sensing Sensitivity: 1 mV
Pulse Gen Serial Number: 174046

## 2021-03-04 ENCOUNTER — Telehealth: Payer: Self-pay

## 2021-03-04 NOTE — Telephone Encounter (Signed)
Scheduled remote reviewed. Normal device function.  The patient spontaneously converted back to sinus rhythm so needs to be programmed back to DDDR, sent to triage. Next remote 91 days.  Patient is also overdue with Dr. Quentin Ore, has transportation issues, able to come into see Renee 03/10/21 @ 11:15. Location, date and time discussed with patient.

## 2021-03-09 ENCOUNTER — Ambulatory Visit: Payer: Self-pay

## 2021-03-09 NOTE — Telephone Encounter (Signed)
Pt tested positive for Covid and would like to speak with a nurse as she has questions and concerns. Cb# [phone]   Pt. Reports she started feeling bad Sunday. Has mild cough, body aches, feels hot and cold. Home test positive. States she is eating and drinking well. Reviewed home care advise. Verbalizes understanding. Requests her PCP know and give further advice.  Reason for Disposition  [1] COVID-19 diagnosed by positive lab test (e.g., PCR, rapid self-test kit) AND [2] mild symptoms (e.g., cough, fever, others) AND [0] no complications or SOB  Answer Assessment - Initial Assessment Questions 1. COVID-19 DIAGNOSIS: "Who made your COVID-19 diagnosis?" "Was it confirmed by a positive lab test or self-test?" If not diagnosed by a doctor (or NP/PA), ask "Are there lots of cases (community spread) where you live?" Note: See public health department website, if unsure.     Home test 2. COVID-19 EXPOSURE: "Was there any known exposure to COVID before the symptoms began?" CDC Definition of close contact: within 6 feet (2 meters) for a total of 15 minutes or more over a 24-hour period.      No 3. ONSET: "When did the COVID-19 symptoms start?"      Sunday 4. WORST SYMPTOM: "What is your worst symptom?" (e.g., cough, fever, shortness of breath, muscle aches)     Feels bad 5. COUGH: "Do you have a cough?" If Yes, ask: "How bad is the cough?"       Mild 6. FEVER: "Do you have a fever?" If Yes, ask: "What is your temperature, how was it measured, and when did it start?"     No 7. RESPIRATORY STATUS: "Describe your breathing?" (e.g., shortness of breath, wheezing, unable to speak)      No 8. BETTER-SAME-WORSE: "Are you getting better, staying the same or getting worse compared to yesterday?"  If getting worse, ask, "In what way?"     Worse 9. HIGH RISK DISEASE: "Do you have any chronic medical problems?" (e.g., asthma, heart or lung disease, weak immune system, obesity, etc.)     Heart disease 10.  VACCINE: "Have you had the COVID-19 vaccine?" If Yes, ask: "Which one, how many shots, when did you get it?"       Yes 11. BOOSTER: "Have you received your COVID-19 booster?" If Yes, ask: "Which one and when did you get it?"       Yes 12. PREGNANCY: "Is there any chance you are pregnant?" "When was your last menstrual period?"       No 13. OTHER SYMPTOMS: "Do you have any other symptoms?"  (e.g., chills, fatigue, headache, loss of smell or taste, muscle pain, sore throat)       Fatigue, chills 14. O2 SATURATION MONITOR:  "Do you use an oxygen saturation monitor (pulse oximeter) at home?" If Yes, ask "What is your reading (oxygen level) today?" "What is your usual oxygen saturation reading?" (e.g., 95%)       No  Protocols used: Coronavirus (COVID-19) Diagnosed or Suspected-A-AH

## 2021-03-09 NOTE — Telephone Encounter (Signed)
See encounter

## 2021-03-09 NOTE — Telephone Encounter (Signed)
Virtual appt has been scheduled for 10/20 at 130pm

## 2021-03-10 ENCOUNTER — Ambulatory Visit: Payer: Medicare Other | Attending: Internal Medicine | Admitting: Internal Medicine

## 2021-03-10 ENCOUNTER — Encounter: Payer: Medicare Other | Admitting: Physician Assistant

## 2021-03-10 ENCOUNTER — Encounter: Payer: Self-pay | Admitting: Internal Medicine

## 2021-03-10 ENCOUNTER — Other Ambulatory Visit: Payer: Self-pay

## 2021-03-10 DIAGNOSIS — U071 COVID-19: Secondary | ICD-10-CM

## 2021-03-10 NOTE — Progress Notes (Signed)
Patient ID: Jacqueline Palmer, female   DOB: [birth year], 75 y.o.   MRN: [id number] Virtual Visit via Telephone Note  I connected with Dorothea Ogle on 03/10/2021 at 1:32 PM by telephone and verified that I am speaking with the correct person using two identifiers  Location: Patient: home Provider: office  Participants: Myself Patient CMA: Ms. Sallyanne Havers    I discussed the limitations, risks, security and privacy concerns of performing an evaluation and management service by telephone and the availability of in person appointments. I also discussed with the patient that there may be a patient responsible charge related to this service. The patient expressed understanding and agreed to proceed.   History of Present Illness: Patient with history of CAD with history CABGx4, chronic diastolic CHF/NICM with BV-ICD, HTN, rheumatic heart disease status post MVR replacement, permanent A. 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  This is an urgent care visit for COVID-19 infection.  Patient reports that 4 days ago she developed sore throat, rhinorrhea and chills.  This was associated with mild congestion and cough.  No shortness of breath fever or body aches.  She did a home COVID test which was positive.  She had received her flu vaccine through [facility] 2 days prior.  She has been in quarantine for the past several days.  She lives in an assisted living facility.  She has had 3 COVID-19 vaccines. She reports that she is feeling much better compared to when symptoms started 4 days ago.  She still has slight congestion but no shortness of breath or fever.  She has been using Tylenol when needed.   Outpatient Encounter Medications as of 03/10/2021  Medication Sig Note   acetaminophen (TYLENOL) 500 MG tablet Take 1,000 mg by mouth in the morning and at  bedtime.    colchicine 0.6 MG tablet Take 1 tablet (0.6 mg total) by mouth at bedtime.    hydrALAZINE (APRESOLINE) 10 MG tablet TAKE ONE TABLET BY MOUTH THREE TIMES DAILY    levETIRAcetam (KEPPRA) 500 MG tablet Take 1 tablet (500 mg total) by mouth 2 (two) times daily.    levothyroxine (SYNTHROID) 25 MCG tablet TAKE ONE TABLET BY MOUTH ONE TIME DAILY 30 to 60 minutes before breakfast on an empty stomach with a full glass of water    metoprolol succinate (TOPROL-XL) 25 MG 24 hr tablet Take 1 tablet (25 mg total) by mouth daily.    nitroGLYCERIN (NITROSTAT) 0.4 MG SL tablet Place 1 tablet (0.4 mg total) under the tongue every 5 (five) minutes as needed for chest pain. 12/02/2020: Took 2 tabs 7/13   polyethylene glycol (MIRALAX / GLYCOLAX) 17 g packet Take 17 g by mouth daily as needed for mild constipation.    pravastatin (PRAVACHOL) 20 MG tablet Take 20 mg by mouth at bedtime.    spironolactone (ALDACTONE) 25 MG tablet Take 1 tablet (25 mg total) by mouth daily.    torsemide (DEMADEX) 20 MG tablet TAKE TWO TABLETS BY MOUTH IN THE MORNING AND ONE IN THE EVENING    warfarin (COUMADIN) 5 MG tablet Take 1 tablet (5 mg total) by mouth every evening.    No facility-administered encounter medications on file as of 03/10/2021.      Observations/Objective: No direct observation done  Assessment and Plan: 1. COVID-19 virus infection -Discussed prescribing oral antiviral medication for her.  However based  on her GFR, we would not be able to prescribe back Paxlovid.  Patient states that she is feeling much better anyway and is not into taking more medication.  Advised that as long as she continues to improve that is good but if not we can refer her for monoclonal antibody infusion.  Advised to get the new COVID booster shot in 1 to 3 months.   Patient advised about quarantine:  If you have mild symptoms that are resolving or have resolved, isolate at home for 5 days since symptoms started and continue to  wear a well fitted mask when around others in the home and in public for 5 additional days after isolation is completed.  If you have a fever and/or moderate to severe symptoms, isolate for at least 10 days since the symptoms started and until you are fever free for at least 24 hours without the use of fever reducing medications. If you tested positive and did not have symptoms, isolate for at least 5 days after your positive test.  Use over-the-counter medications for symptoms.  If you develop respiratory issues/distress, seek medical care in the emergency department.  If you must leave home or if you have to be around others, please wear a mask.  Please limit contact with immediate family members in the home, practice social distancing, frequent handwashing and clean hard surfaces touched frequently with household cleaning products.  Members of your household will also need to quarantine and be tested.    Follow Up Instructions: PRN   I discussed the assessment and treatment plan with the patient. The patient was provided an opportunity to ask questions and all were answered. The patient agreed with the plan and demonstrated an understanding of the instructions.   The patient was advised to call back or seek an in-person evaluation if the symptoms worsen or if the condition fails to improve as anticipated.  I  Spent  14 minutes on this telephone encounter  Karle Plumber, MD

## 2021-03-11 NOTE — Progress Notes (Signed)
Remote ICD transmission.   

## 2021-03-14 ENCOUNTER — Ambulatory Visit
Admission: RE | Admit: 2021-03-14 | Discharge: 2021-03-14 | Disposition: A | Discharge status: Home / Self Care | Payer: Medicare Other | Source: Ambulatory Visit | Attending: Radiation Oncology | Admitting: Radiation Oncology

## 2021-03-15 ENCOUNTER — Ambulatory Visit
Admission: RE | Admit: 2021-03-15 | Discharge: 2021-03-15 | Disposition: A | Discharge status: Home / Self Care | Payer: Medicare Other | Source: Ambulatory Visit | Attending: Internal Medicine | Admitting: Internal Medicine

## 2021-03-15 ENCOUNTER — Other Ambulatory Visit: Payer: Self-pay

## 2021-03-15 DIAGNOSIS — Z1231 Encounter for screening mammogram for malignant neoplasm of breast: Secondary | ICD-10-CM

## 2021-03-15 IMAGING — MG MM DIGITAL SCREENING BILAT W/ TOMO AND CAD
6 of 10 series · 6 of 30 positions shown · non-contrast
Comparison: Previous exam(s).

CLINICAL DATA: Screening.

EXAM:
DIGITAL SCREENING BILATERAL MAMMOGRAM WITH TOMOSYNTHESIS AND CAD
TECHNIQUE: Bilateral screening digital craniocaudal and mediolateral oblique
mammograms were obtained. Bilateral screening digital breast
tomosynthesis was performed. The images were evaluated with
computer-aided detection.

[L CC synth-2D]
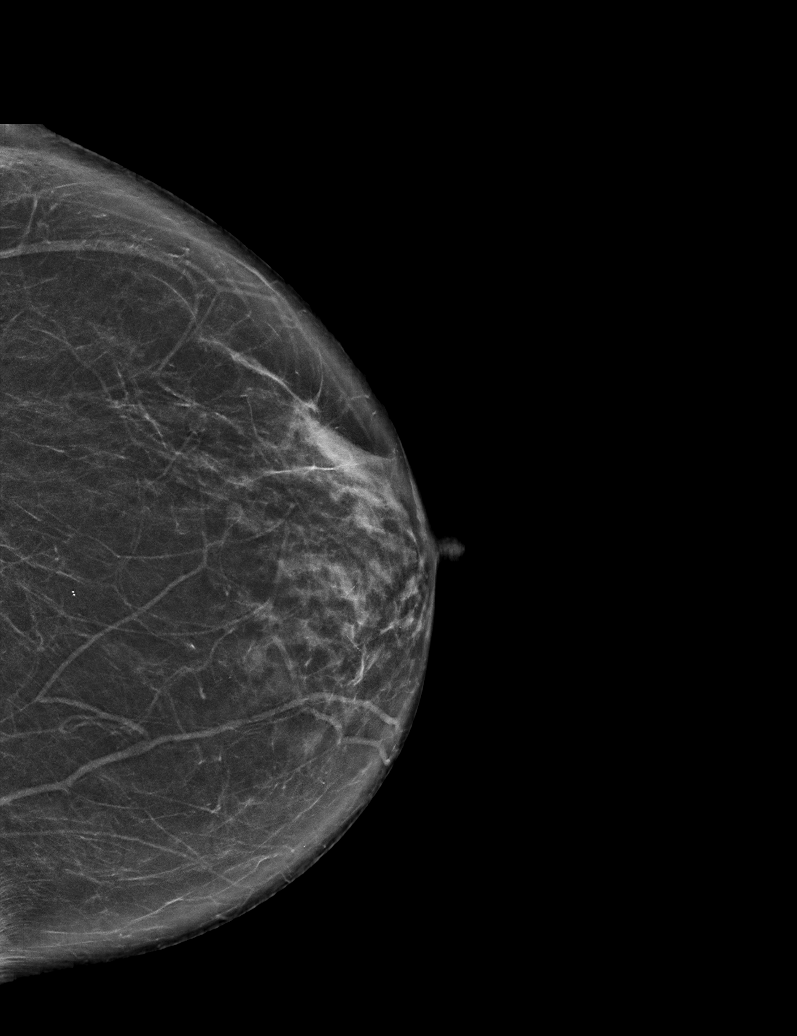

[R CC synth-2D]
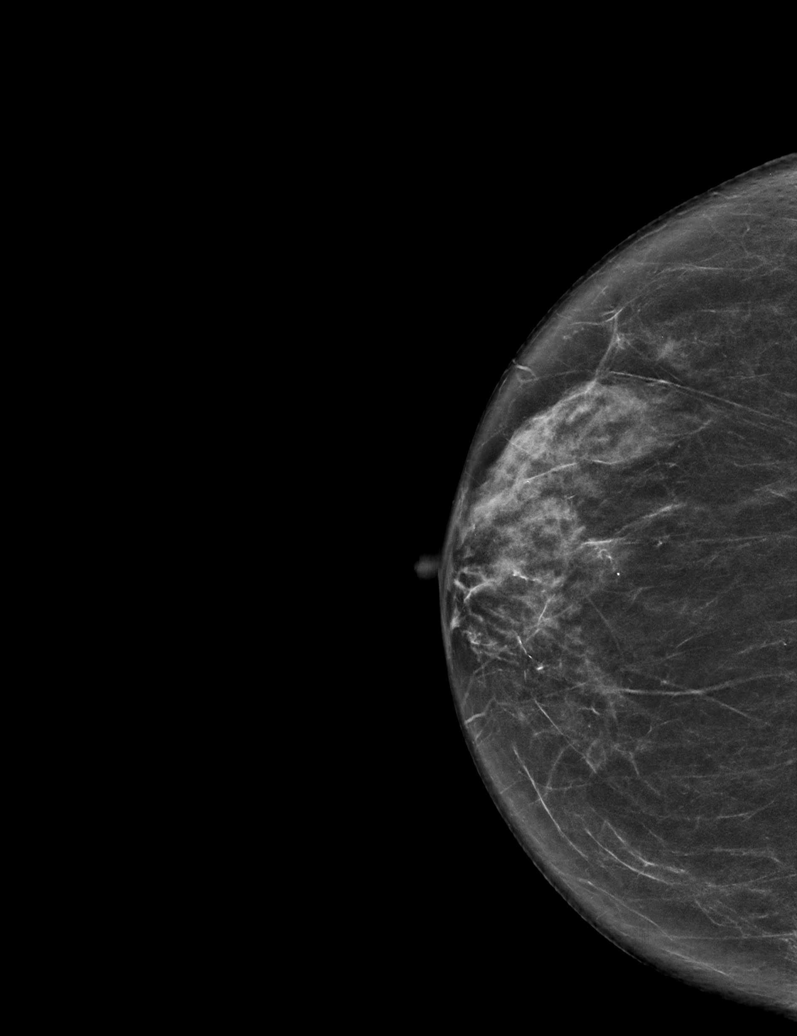

[L MLO synth-2D (1 of 2)]
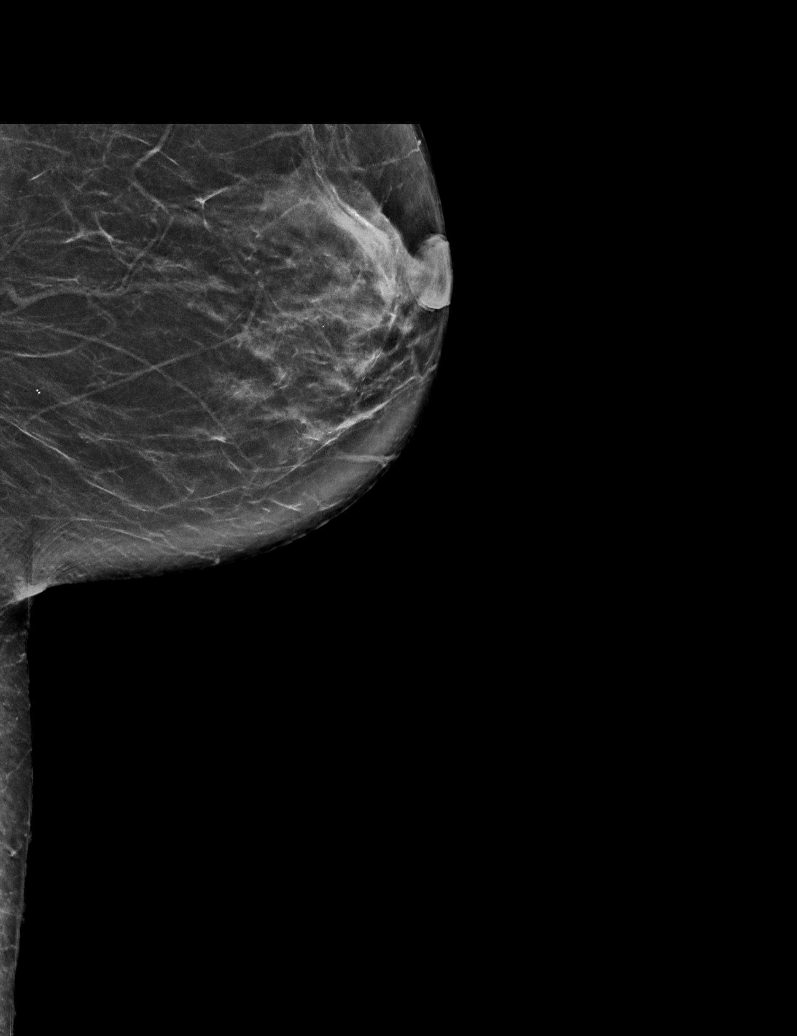

[R MLO synth-2D]
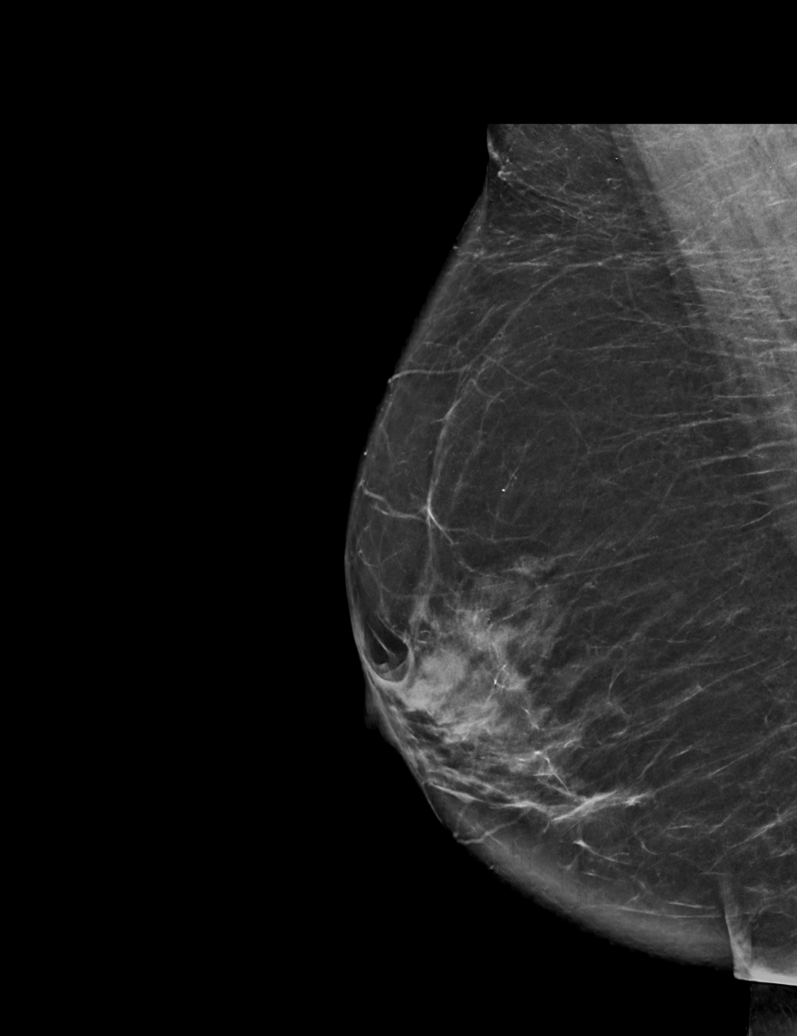

[L MLO synth-2D (2 of 2)]
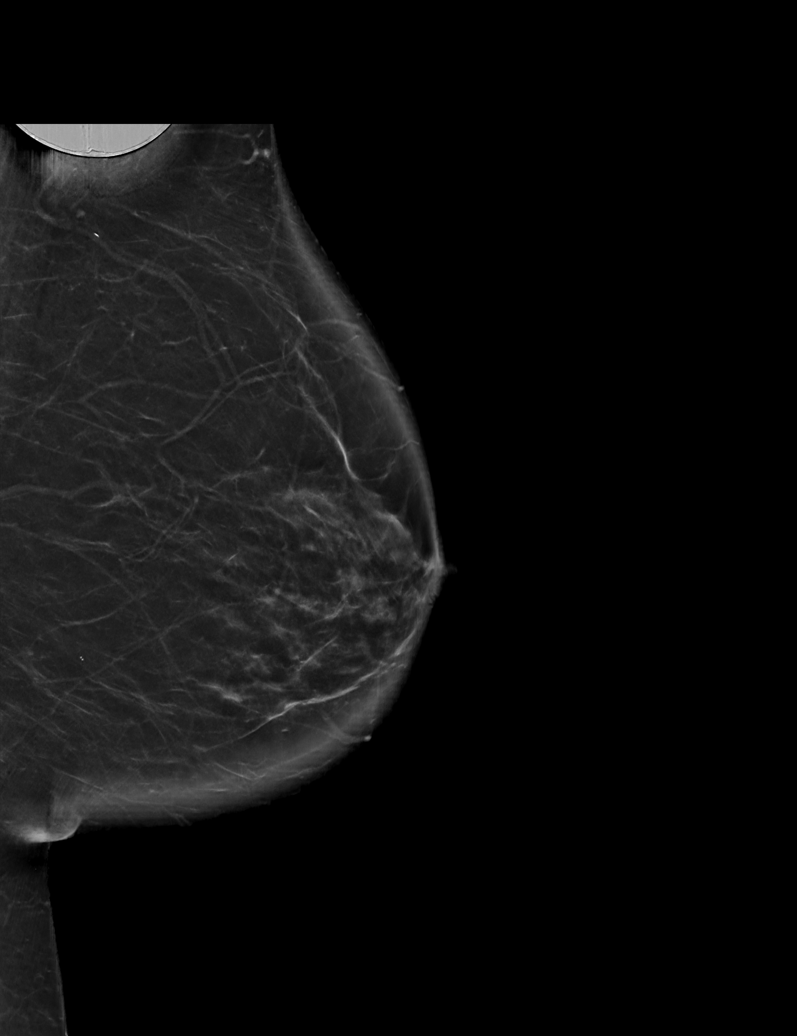

[L MLO tomo · tomo slice 38/75.0]
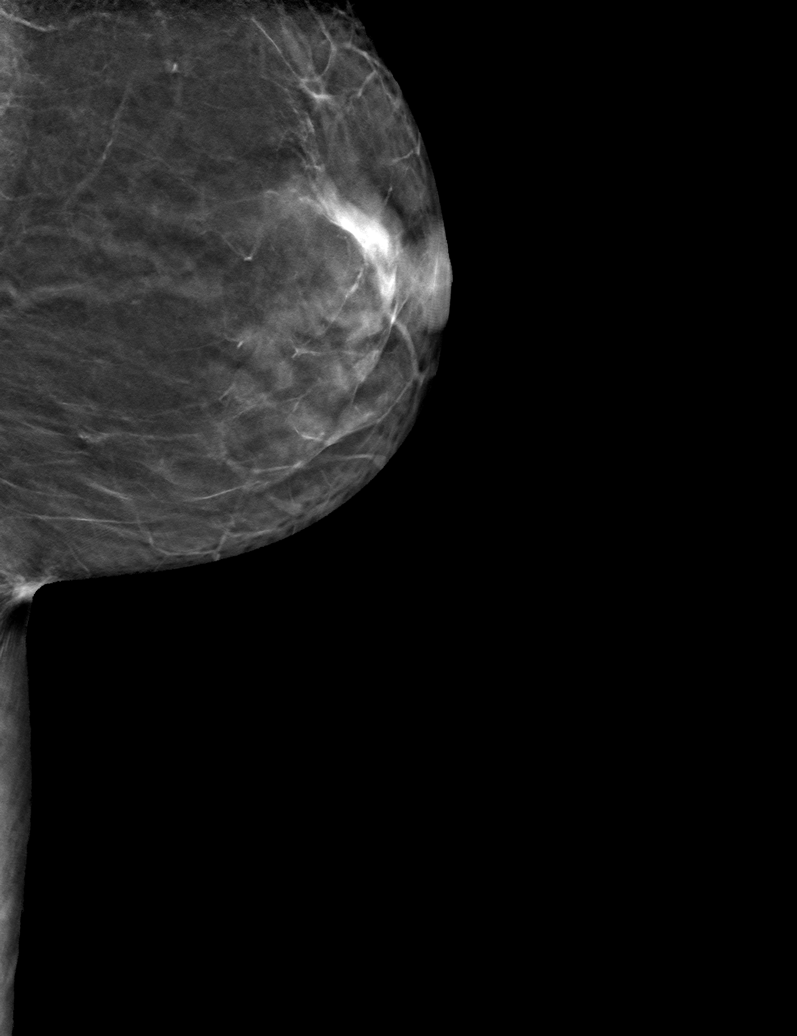

[6 of 30 positions shown; findings below may reference images not displayed]

ACR Breast Density Category b: There are scattered areas of
fibroglandular density.
FINDINGS: There are no findings suspicious for malignancy.
IMPRESSION: No mammographic evidence of malignancy. A result letter of this
screening mammogram will be mailed directly to the patient.

RECOMMENDATION:
Screening mammogram in one year. (Code:[BY])

BI-RADS CATEGORY  1: Negative.

## 2021-03-17 NOTE — Op Note (Signed)
Name: Jacqueline Palmer    MRN: [id number]   Date: 02/08/2021    DOB: [birth year]   STEREOTACTIC RADIOSURGERY OPERATIVE NOTE  PRE-OPERATIVE DIAGNOSIS:  Metastatic Renal Cell Carcinoma  POST-OPERATIVE DIAGNOSIS:  Same  PROCEDURE:  Stereotactic Radiosurgery  SURGEON:  Consuella Lose, MD  RADIATION ONCOLOGIST: Dr. Tyler Pita, MD  TECHNIQUE:  The patient underwent a radiation treatment planning session in the radiation oncology simulation suite under the care of the radiation oncology physician and physicist.  I participated closely in the radiation treatment planning afterwards. The patient underwent planning CT which was fused to 3T high resolution MRI with 1 mm axial slices.  These images were fused on the planning system.  We contoured the gross target volumes and subsequently expanded this to yield the Planning Target Volume. I actively participated in the planning process.  I helped to define and review the target contours and also the contours of the optic pathway, eyes, brainstem and selected nearby organs at risk.  All the dose constraints for critical structures were reviewed and compared to AAPM Task Group 101.  The prescription dose conformity was reviewed.  I approved the plan electronically.    Accordingly, [name]  was brought to the TrueBeam stereotactic radiation treatment linac and placed in the custom immobilization mask.  The patient was aligned according to the IR fiducial markers with BrainLab Exactrac, then orthogonal x-rays were used in ExacTrac with the 6DOF robotic table and the shifts were made to align the patient  [name] received stereotactic radiosurgery to a prescription dose of 20Gy to left frontal lesion uneventfully.    The detailed description of the procedure is recorded in the radiation oncology procedure note.  I was present for the duration of the procedure.  DISPOSITION:   Following delivery, the patient was transported to nursing in  stable condition and monitored for possible acute effects to be discharged to home in stable condition with follow-up in one month.  Consuella Lose, MD [facility]

## 2021-03-22 ENCOUNTER — Other Ambulatory Visit: Payer: Self-pay

## 2021-03-22 ENCOUNTER — Ambulatory Visit (INDEPENDENT_AMBULATORY_CARE_PROVIDER_SITE_OTHER): Payer: Medicare Other | Admitting: *Deleted

## 2021-03-22 DIAGNOSIS — I482 Chronic atrial fibrillation, unspecified: Secondary | ICD-10-CM | POA: Diagnosis not present

## 2021-03-22 DIAGNOSIS — Z5181 Encounter for therapeutic drug level monitoring: Secondary | ICD-10-CM | POA: Diagnosis not present

## 2021-03-22 DIAGNOSIS — Z7901 Long term (current) use of anticoagulants: Secondary | ICD-10-CM | POA: Diagnosis not present

## 2021-03-22 LAB — POCT INR: INR: 4 — AB (ref 2.0–3.0)

## 2021-03-22 NOTE — Patient Instructions (Signed)
Description   HOLD warfarin today and then start taking warfarin 1 tablet daily except for 1.5 tablets on Mondays. Recheck INR in 2 weeks.

## 2021-04-04 ENCOUNTER — Ambulatory Visit
Admission: RE | Admit: 2021-04-04 | Discharge: 2021-04-04 | Disposition: A | Discharge status: Home / Self Care | Payer: Medicare Other | Source: Ambulatory Visit | Attending: Radiation Oncology | Admitting: Radiation Oncology

## 2021-04-04 DIAGNOSIS — C7931 Secondary malignant neoplasm of brain: Secondary | ICD-10-CM

## 2021-04-04 NOTE — Progress Notes (Signed)
  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 During treatment she did very well with radiotherapy and did not have significant desquamation. She reports she has had som hyperpigmentation of her eyelids bilaterally and right hip and leg pains that she thinks are from arthritis.  Impression/Plan: 1. 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 The patient has been doing well since completion of radiotherapy. We discussed that we would recommend her first surveillance MRI next month which she is scheduled for, and has follow up with Dr. Mickeal Skinner. But she will also continue to follow up with Dr. Mickeal Skinner and Dr. Alen Blew in medical oncology.  2. Skin and joint complaints. I encouraged her to follow up with her PCP as well as these do not seem related to her prior radiation treatment.     Carola Rhine, PAC

## 2021-04-05 ENCOUNTER — Other Ambulatory Visit: Payer: Self-pay

## 2021-04-05 ENCOUNTER — Ambulatory Visit (INDEPENDENT_AMBULATORY_CARE_PROVIDER_SITE_OTHER): Payer: Medicare Other | Admitting: *Deleted

## 2021-04-05 DIAGNOSIS — Z7901 Long term (current) use of anticoagulants: Secondary | ICD-10-CM

## 2021-04-05 DIAGNOSIS — I482 Chronic atrial fibrillation, unspecified: Secondary | ICD-10-CM

## 2021-04-05 DIAGNOSIS — Z5181 Encounter for therapeutic drug level monitoring: Secondary | ICD-10-CM | POA: Diagnosis not present

## 2021-04-05 LAB — POCT INR: INR: 3.3 — AB (ref 2.0–3.0)

## 2021-04-05 NOTE — Patient Instructions (Signed)
Description   Hold warfarin today and start taking warfarin 1 tablet daily. Recheck INR in 2 weeks. Coumadin Clinic [phone]

## 2021-04-11 ENCOUNTER — Other Ambulatory Visit: Payer: Self-pay | Admitting: Internal Medicine

## 2021-04-11 DIAGNOSIS — I5032 Chronic diastolic (congestive) heart failure: Secondary | ICD-10-CM

## 2021-04-11 NOTE — Telephone Encounter (Signed)
Requested medications are due for refill today.  yes  Requested medications are on the active medications list.  yes  Last refill. 12/14/2020  Future visit scheduled.   yes  Notes to clinic.  Failed protocol d/t abnormal lab.

## 2021-04-13 ENCOUNTER — Other Ambulatory Visit: Payer: Self-pay | Admitting: Internal Medicine

## 2021-04-13 DIAGNOSIS — I5032 Chronic diastolic (congestive) heart failure: Secondary | ICD-10-CM

## 2021-04-17 NOTE — Telephone Encounter (Signed)
Attempted to call [facility] pharmacist but was closed.

## 2021-04-19 ENCOUNTER — Other Ambulatory Visit: Payer: Self-pay

## 2021-04-19 ENCOUNTER — Encounter: Payer: Self-pay | Admitting: Internal Medicine

## 2021-04-19 ENCOUNTER — Ambulatory Visit: Payer: Medicare Other | Attending: Internal Medicine | Admitting: Internal Medicine

## 2021-04-19 VITALS — BP 125/79 | HR 69 | Resp 16 | Wt 191.4 lb

## 2021-04-19 DIAGNOSIS — I482 Chronic atrial fibrillation, unspecified: Secondary | ICD-10-CM | POA: Diagnosis not present

## 2021-04-19 DIAGNOSIS — I25718 Atherosclerosis of autologous vein coronary artery bypass graft(s) with other forms of angina pectoris: Secondary | ICD-10-CM | POA: Diagnosis not present

## 2021-04-19 DIAGNOSIS — Z23 Encounter for immunization: Secondary | ICD-10-CM

## 2021-04-19 DIAGNOSIS — M25561 Pain in right knee: Secondary | ICD-10-CM

## 2021-04-19 DIAGNOSIS — M25551 Pain in right hip: Secondary | ICD-10-CM | POA: Diagnosis not present

## 2021-04-19 DIAGNOSIS — L819 Disorder of pigmentation, unspecified: Secondary | ICD-10-CM

## 2021-04-19 DIAGNOSIS — I10 Essential (primary) hypertension: Secondary | ICD-10-CM | POA: Diagnosis not present

## 2021-04-19 DIAGNOSIS — Z789 Other specified health status: Secondary | ICD-10-CM

## 2021-04-19 DIAGNOSIS — R269 Unspecified abnormalities of gait and mobility: Secondary | ICD-10-CM

## 2021-04-19 DIAGNOSIS — G8929 Other chronic pain: Secondary | ICD-10-CM

## 2021-04-19 NOTE — Progress Notes (Signed)
Patient ID: Jacqueline Palmer, female    DOB: 14-Nov-1945  MRN: [id number]  CC: No chief complaint on file.   Subjective: Jacqueline Palmer is a 75 y.o. female who presents for chronic ds management Her concerns today include:  Patient with history of CAD with history CABGx4, chronic diastolic CHF/NICM with BV-ICD, HTN, rheumatic heart disease status post MVR replacement, permanent A. 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    Living Will: Patient has executed a living will and healthcare power of attorney since she last saw me.  She brings a copy of these documents with her that she had notarized.  Healthcare power of attorney is named as Insurance account manager for [employer] of [place] and [employer] also of [place].  She has specifically requested no prolonged treatments to exceed 7 days, she wants her organs to be donated, may do autopsy, no surgery, no dialysis, no chemotherapy or steroids.  She has a form to request disability placard for her vehicle.    Plans to get the new COVID booster shot.  It is offered at the assisted living facility where she lives but she does not want to get it there.  She states after receiving her third COVID shot she had a seizure that was determined to be caused by the brain met and not from the shot.  However she would prefer to get her next COVID booster at an urgent care center or the likes in the event she has any adverse reaction, medical personnel will be present to assist.  This would make her feel more comfortable.  Going to P.T 2-3 x a wk at her community ALF.  To continue she will need to see ortho.  They were working on RT hip, thigh and knee.  Her oncologist gave Gabapentin which has not help. P.T thinks it is the psosas muscle Patient reports pain in the right groin/hip joint.  No nubmness/tingling.  Pain  is most pronounced when she sits, stands, turns in bed and when attempting to get in and out of her vehicle.  She ambulates with a cane.     She has noted some hypopigmented discoloration around her cheeks which she has had for a while but seems to be getting worse.  She wonders whether this may be due to vitamin D deficiency.    HTN/CAD/atrial fibrillation:   no CP/SOB/LE edema/No bruising or bleeding on Coumadin.  Coumadin level is followed through cardiology anticoagulation clinic.  Thyroid:  taking levothyroxine consistently  HM:  due for shingles and Pneumonia vaccine 15.  She is agreeable to receiving the latter today.  I will find out from her oncologist whether we can give the Shingrix shot. Patient Active Problem List   Diagnosis Date Noted   Acute-on-chronic kidney injury ([place]) 12/03/2020   Subtherapeutic international normalized ratio (INR) 12/03/2020   Chest pain 12/02/2020   Neuropathic pain 11/25/2020   Seizures ([place]) 06/16/2020   Over weight 05/03/2020   Nonischemic cardiomyopathy ([place]) 03/04/2020   Biventricular automatic implantable cardioverter defibrillator in situ 02/19/2020   Chronic diastolic heart failure ([place]) 02/19/2020   Hx of CABG 02/19/2020   Coronary artery disease involving native coronary artery of native heart without angina pectoris 02/19/2020   Essential hypertension 02/19/2020   Permanent atrial fibrillation ([place]) 02/19/2020   CKD (chronic kidney disease) stage 4, GFR 15-29  ml/min ([place]) 02/19/2020   Renal cell carcinoma of right kidney ([place]) 02/12/2020   Brain metastasis ([place]) 02/12/2020   Gout attack 08/14/2016   Chronic a-fib ([place]) 08/14/2016   Chronic anticoagulation 08/14/2016   Acute on chronic systolic heart failure (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     colchicine 0.6 MG tablet Take 1 tablet (0.6 mg total) by mouth at bedtime. 90 tablet 3   hydrALAZINE (APRESOLINE) 10 MG tablet TAKE ONE TABLET BY MOUTH THREE TIMES DAILY 270 tablet 2   levETIRAcetam (KEPPRA) 500 MG tablet Take 1 tablet (500 mg total) by mouth 2 (two) times daily. 60 tablet 0   levothyroxine (SYNTHROID) 25 MCG tablet TAKE ONE TABLET BY MOUTH ONE TIME DAILY 30 to 60 minutes before breakfast on an empty stomach with a full glass of water 90 tablet 2   metoprolol succinate (TOPROL-XL) 25 MG 24 hr tablet Take 1 tablet (25 mg total) by mouth daily. 90 tablet 3   nitroGLYCERIN (NITROSTAT) 0.4 MG SL tablet Place 1 tablet (0.4 mg total) under the tongue every 5 (five) minutes as needed for chest pain. 25 tablet 3   polyethylene glycol (MIRALAX / GLYCOLAX) 17 g packet Take 17 g by mouth daily as needed for mild constipation.     pravastatin (PRAVACHOL) 20 MG tablet Take 20 mg by mouth at bedtime.     spironolactone (ALDACTONE) 25 MG tablet Take 1 tablet (25 mg total) by mouth daily. 90 tablet 3   torsemide (DEMADEX) 20 MG tablet TAKE TWO TABLETS BY MOUTH IN THE MORNING AND ONE TABLET IN THE EVENING 90 tablet 2   warfarin (COUMADIN) 5 MG tablet Take 1 tablet (5 mg total) by mouth every evening. 90 tablet 1   No current facility-administered medications on file prior to visit.    Allergies  Allergen Reactions   Avocado Shortness Of Breath    Before 2007   Banana Anaphylaxis and Swelling   Plantain Anaphylaxis and Swelling   Ace Inhibitors Cough   Codeine Other (See Comments)    Nausea and Feels Jittery   Prednisone Swelling    Of face and lower extremities   Tape Hives    Tape  Does better with paper tape  Tape  Does better with paper tape     Latex Rash    Social History   Socioeconomic History   Marital status: Widowed    Spouse name: Not on file   Number of children: 0   Years of education: Doctorate   Highest education level: Master's degree (e.g., MA,  MS, MEng, MEd, MSW, MBA)  Occupational History   Occupation: Retired Clinical biochemist  Tobacco Use   Smoking status: Former    Types: Cigarettes    Quit date: 07/30/2014    Years since quitting: 6.7   Smokeless tobacco: Never  Vaping Use   Vaping Use: Never used  Substance and Sexual Activity   Alcohol use: Yes    Comment: 1 glass wine twice weekly   Drug use: No   Sexual activity: Not on file  Other Topics Concern   Not on file  Social History Narrative   Right handed   Coffee daily/tea qod   Social Determinants of Health   Financial Resource Strain: Not on file  Food  Insecurity: Not on file  Transportation Needs: Not on file  Physical Activity: Not on file  Stress: Not on file  Social Connections: Not on file  Intimate Partner Violence: Not on file    Family History  Problem Relation Age of Onset   Heart attack Mother    Heart attack Father    Heart attack Sister    Breast cancer Neg Hx     Past Surgical History:  Procedure Laterality Date   ABDOMINAL HYSTERECTOMY     CORONARY ARTERY BYPASS GRAFT     CRANIOTOMY Right 07/30/2019   Right frontal   NEPHRECTOMY Right 10/16/2019    ROS: Review of Systems Negative except as stated above  PHYSICAL EXAM: BP 125/79   Pulse 69   Resp 16   Wt 191 lb 6.4 oz (86.8 kg)   SpO2 99%   BMI 29.10 kg/m   Physical Exam  General appearance - alert, well appearing, and in no distress Mental status - normal mood, behavior, speech, dress, motor activity, and thought processes Chest - clear to auscultation, no wheezes, rales or rhonchi, symmetric air entry Heart -heart rate is irregularly irregular but rate controlled.   Musculoskeletal -patient ambulates with a cane.  Gait is slowed.  She favors her right side.  She requires some assistance to get onto the exam table. Extremities - peripheral pulses normal, no pedal edema, no clubbing or cyanosis Skin -subtle hypopigmented areas over both cheeks more pronounced on the right  side.   CMP Latest Ref Rng & Units 02/01/2021 12/14/2020 12/02/2020  Glucose 70 - 99 mg/dL 86 80 83  BUN 8 - 23 mg/dL 43(H) 45(H) 60(H)  Creatinine 0.44 - 1.00 mg/dL 2.74(H) 2.52(H) 2.85(H)  Sodium 135 - 145 mmol/L 137 141 131(L)  Potassium 3.5 - 5.1 mmol/L 4.2 4.2 4.2  Chloride 98 - 111 mmol/L 101 103 95(L)  CO2 22 - 32 mmol/L _0 Calcium 8.9 - 10.3 mg/dL 10.1 9.5 9.7  Total Protein 6.5 - 8.1 g/dL 8.0 - -  Total Bilirubin 0.3 - 1.2 mg/dL 0.8 - -  Alkaline Phos 38 - 126 U/L 88 - -  AST 15 - 41 U/L 21 - -  ALT 0 - 44 U/L 16 - -   Lipid Panel  No results found for: CHOL, TRIG, HDL, CHOLHDL, VLDL, LDLCALC, LDLDIRECT  CBC    Component Value Date/Time   WBC 6.3 02/01/2021 1250   WBC 7.3 12/02/2020 1652   RBC 5.32 (H) 02/01/2021 1250   HGB 12.5 02/01/2021 1250   HCT 38.9 02/01/2021 1250   PLT 255 02/01/2021 1250   MCV 73.1 (L) 02/01/2021 1250   MCH 23.5 (L) 02/01/2021 1250   MCHC 32.1 02/01/2021 1250   RDW 14.6 02/01/2021 1250   LYMPHSABS 2.3 02/01/2021 1250   MONOABS 0.7 02/01/2021 1250   EOSABS 0.2 02/01/2021 1250   BASOSABS 0.1 02/01/2021 1250    ASSESSMENT AND PLAN: 1. Essential hypertension Stable on hydralazine, metoprolol and spironolactone.  2. Chronic atrial fibrillation ([place]) Stable and followed by cardiology.  3. Coronary artery disease of autologous vein bypass graft with stable angina pectoris ([place]) Stable on current medications.  4. Hip pain, chronic, right Most likely osteoarthritis.  We will get baseline x-rays and refer to orthopedics. - DG Hip Unilat W OR W/O Pelvis 2-3 Views Right; Future - Ambulatory referral to Orthopedic Surgery  5. Chronic pain of right knee See #4 above. - DG Knee Complete 4 Views Right; Future - Ambulatory  referral to Orthopedic Surgery  6. Gait disturbance See #4 and 5 above. Form completed for her to get a handicap sticker for her vehicle.  7. Living will in place Copy of her living will and healthcare power of  attorney will be placed in her records.  8. Discoloration of skin - Ambulatory referral to Dermatology  9. Need for vaccination against Streptococcus pneumoniae Given pneumococcal 15 today.  I will find out from our clinical pharmacist whether any of the [facility] urgent care facilities offer the COVID booster vaccine.  If not I told her that she can get it at the health department or any outside pharmacy.  Usual procedure is that the patient is observed for 10 to 15 minutes after being given the shot.  Patient was given the opportunity to ask questions.  Patient verbalized understanding of the plan and was able to repeat key elements of the plan.   No orders of the defined types were placed in this encounter.    Requested Prescriptions    No prescriptions requested or ordered in this encounter    No follow-ups on file.  Karle Plumber, MD, FACP

## 2021-04-21 ENCOUNTER — Ambulatory Visit (INDEPENDENT_AMBULATORY_CARE_PROVIDER_SITE_OTHER): Payer: Medicare Other

## 2021-04-21 ENCOUNTER — Ambulatory Visit (HOSPITAL_COMMUNITY)
Admission: RE | Admit: 2021-04-21 | Discharge: 2021-04-21 | Disposition: A | Discharge status: Home / Self Care | Payer: Medicare Other | Source: Ambulatory Visit | Attending: Internal Medicine | Admitting: Internal Medicine

## 2021-04-21 ENCOUNTER — Other Ambulatory Visit: Payer: Self-pay

## 2021-04-21 DIAGNOSIS — Z5181 Encounter for therapeutic drug level monitoring: Secondary | ICD-10-CM

## 2021-04-21 DIAGNOSIS — M25561 Pain in right knee: Secondary | ICD-10-CM | POA: Diagnosis present

## 2021-04-21 DIAGNOSIS — Z7901 Long term (current) use of anticoagulants: Secondary | ICD-10-CM

## 2021-04-21 DIAGNOSIS — G8929 Other chronic pain: Secondary | ICD-10-CM | POA: Insufficient documentation

## 2021-04-21 DIAGNOSIS — M25551 Pain in right hip: Secondary | ICD-10-CM | POA: Insufficient documentation

## 2021-04-21 DIAGNOSIS — I482 Chronic atrial fibrillation, unspecified: Secondary | ICD-10-CM | POA: Diagnosis not present

## 2021-04-21 LAB — POCT INR: INR: 1.5 — AB (ref 2.0–3.0)

## 2021-04-21 IMAGING — DX DG HIP (WITH OR WITHOUT PELVIS) 2-3V*R*
3 series · 3 of 3 positions shown · non-contrast
Comparison: [DATE] CT

CLINICAL DATA: Right hip pain for 2 months, no known injury,
initial encounter

EXAM:
DG HIP (WITH OR WITHOUT PELVIS) 3V RIGHT

[pelvis ap]
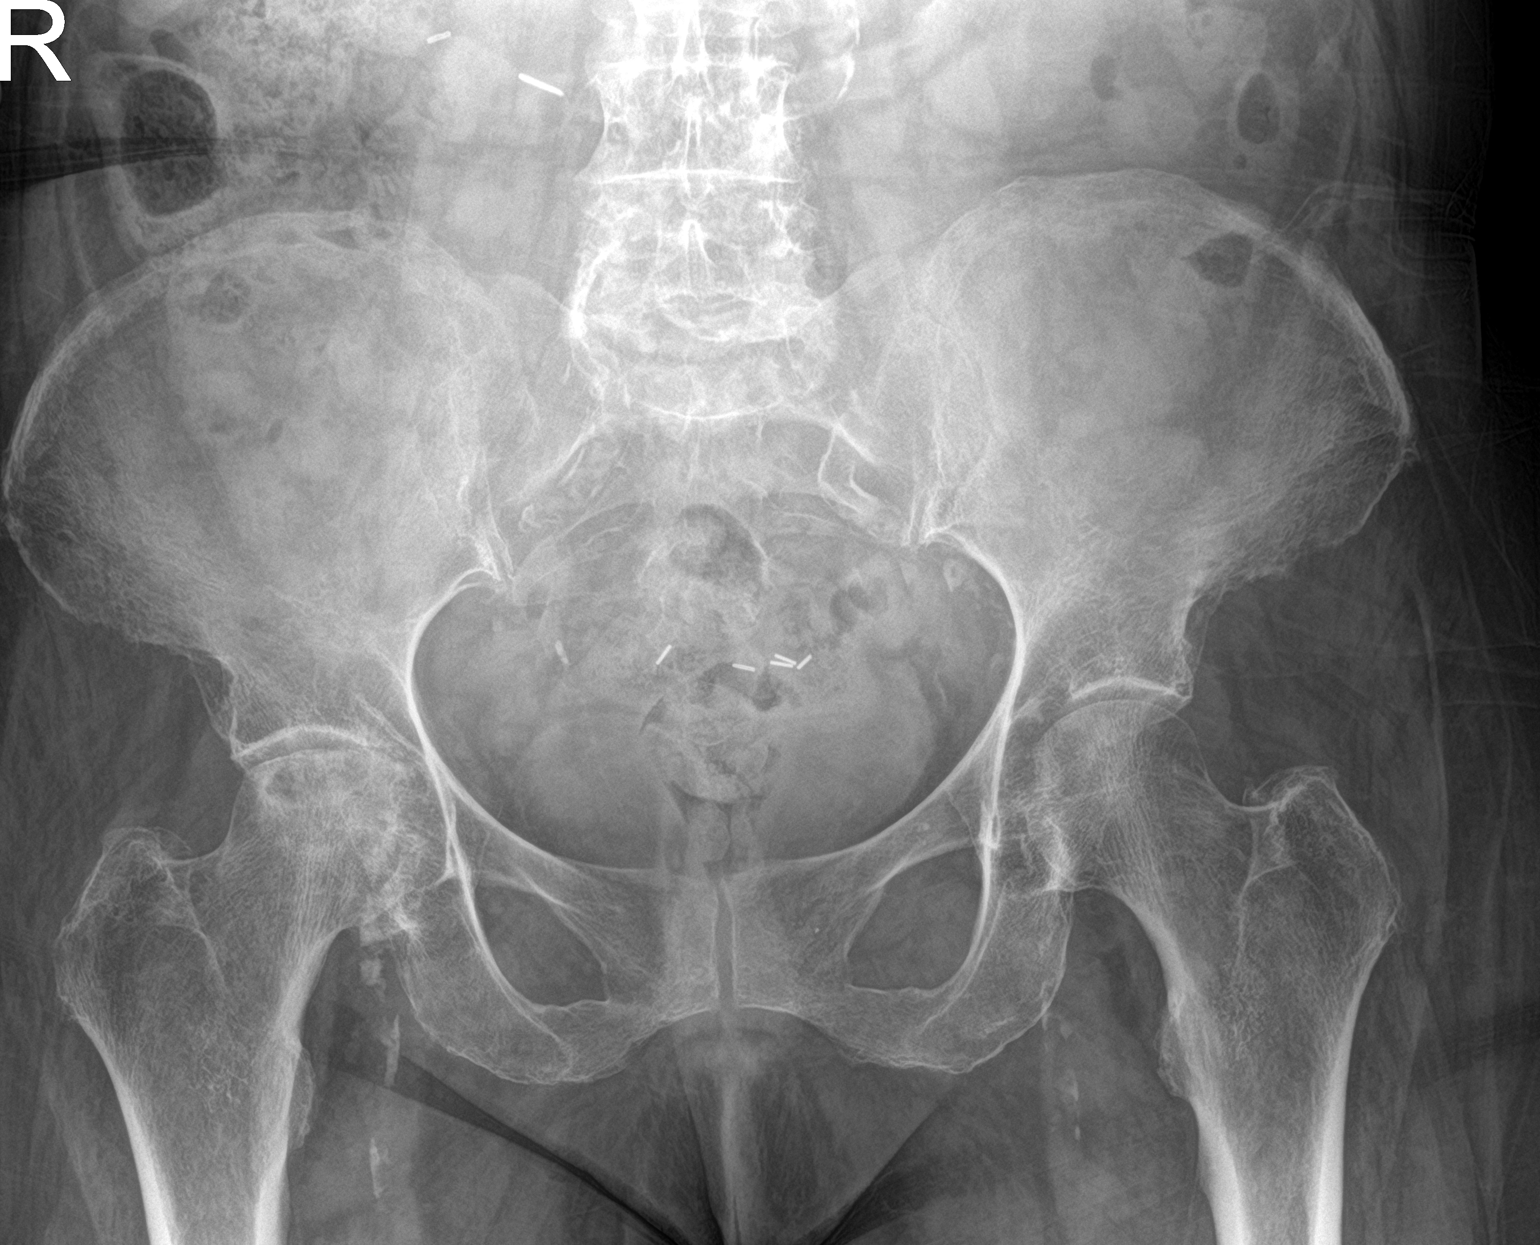

[hip ap]
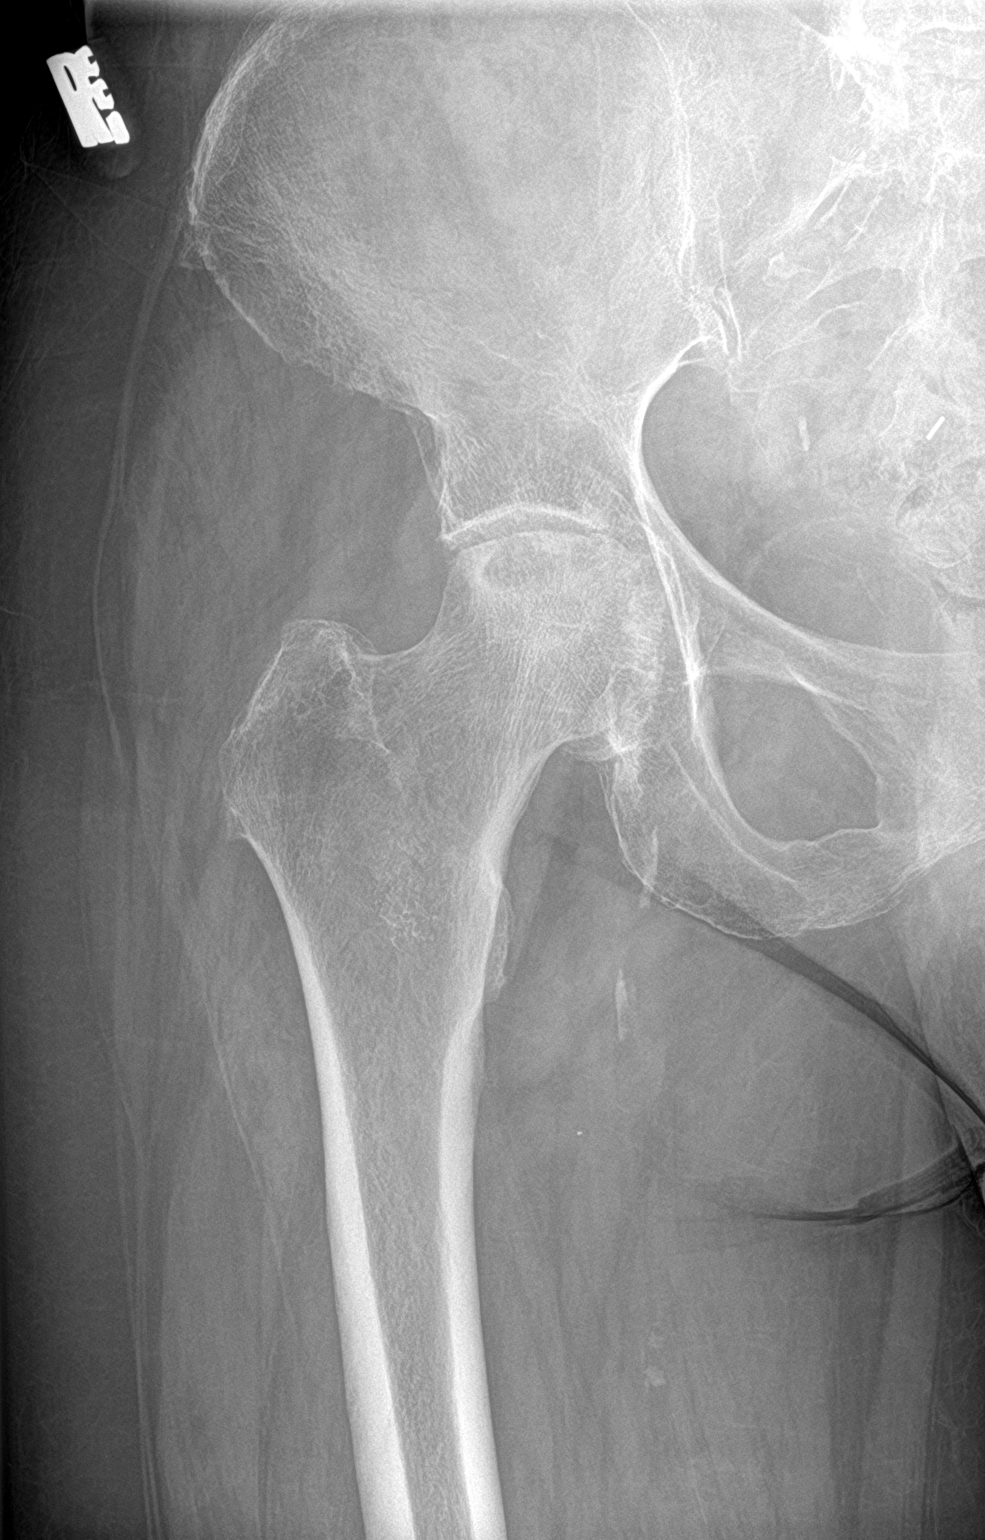

[hip lat]
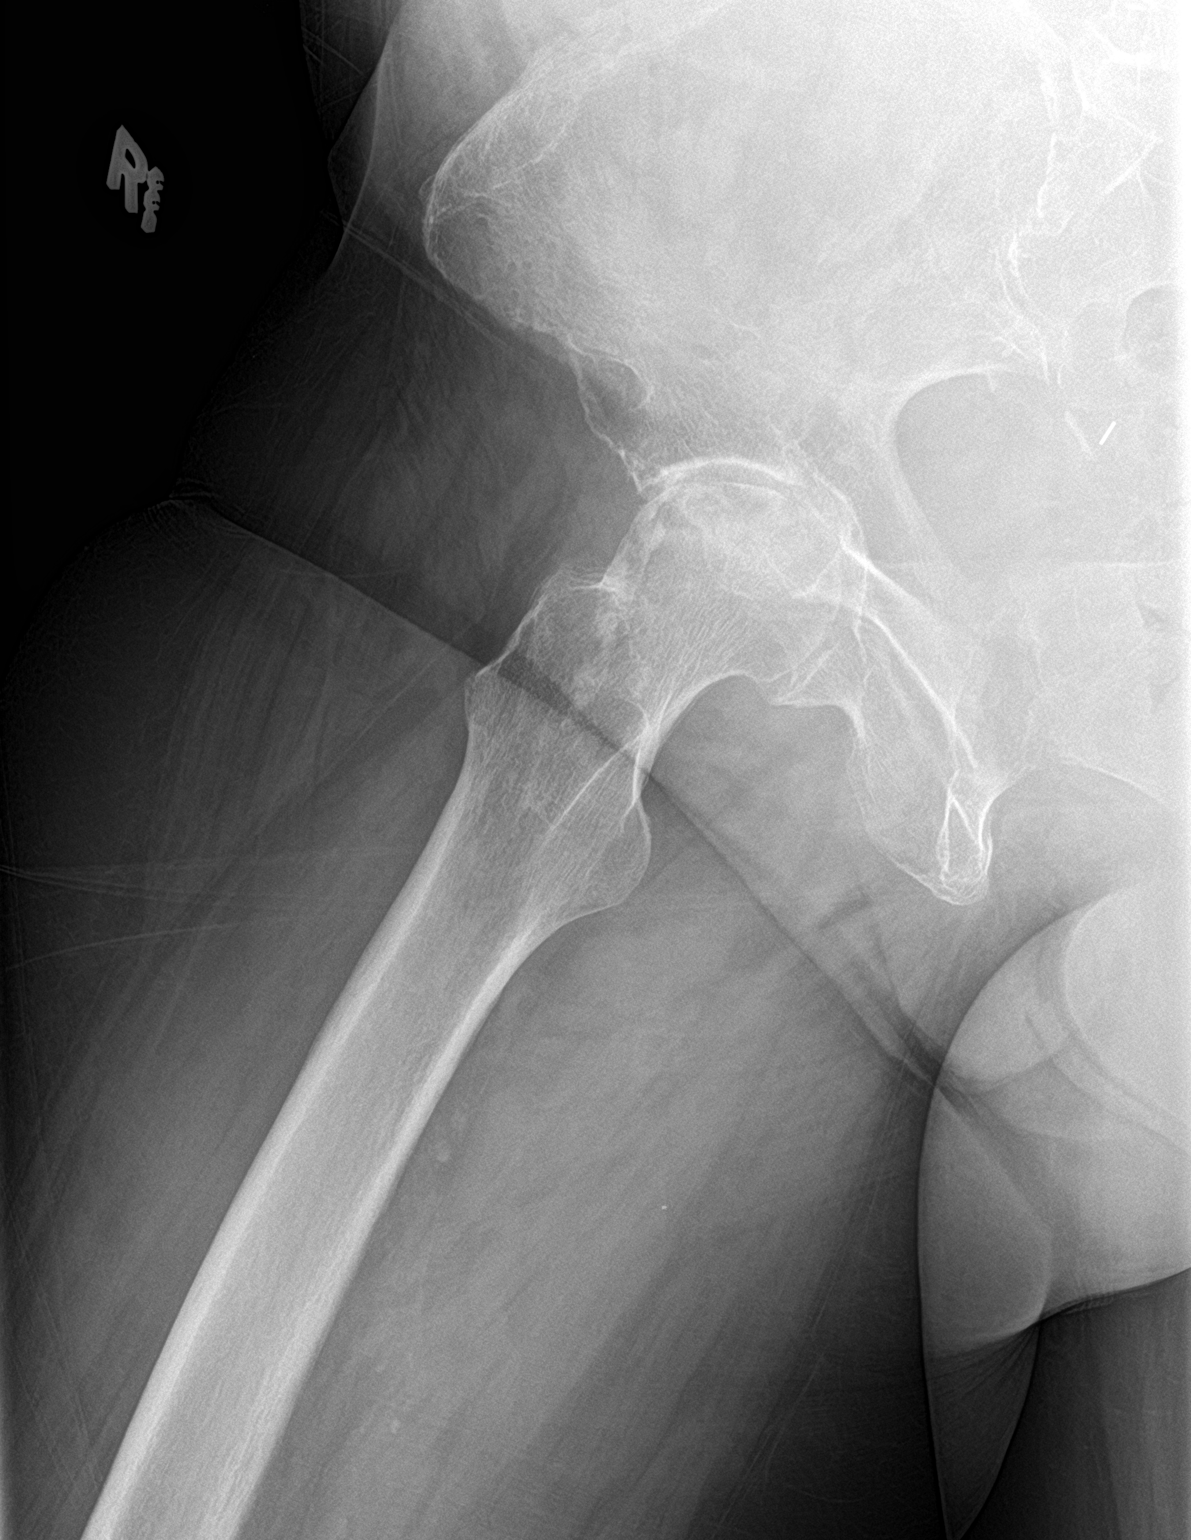

[3 of 3 positions shown; findings below may reference images not displayed]

FINDINGS: Pelvic ring is intact. Changes of avascular necrosis with mild
collapse of the right femoral head is seen slightly progressed when
compared with the prior exam. No acute fracture or dislocation is
noted. No soft tissue changes are seen.
IMPRESSION: Avascular necrosis in the right femoral head with mild collapse.
This is slightly advanced when compare with the prior exam.

## 2021-04-21 NOTE — Patient Instructions (Signed)
Take 2 tablets today only and then warfarin 1 tablet daily. Recheck INR in 2 weeks. Coumadin Clinic [phone]. Stay consistent with greens.

## 2021-04-24 ENCOUNTER — Encounter: Payer: Self-pay | Admitting: Internal Medicine

## 2021-04-24 DIAGNOSIS — Z789 Other specified health status: Secondary | ICD-10-CM | POA: Insufficient documentation

## 2021-04-24 DIAGNOSIS — I7121 Aneurysm of the ascending aorta, without rupture: Secondary | ICD-10-CM | POA: Insufficient documentation

## 2021-05-03 ENCOUNTER — Ambulatory Visit: Payer: Medicare Other | Admitting: Internal Medicine

## 2021-05-05 ENCOUNTER — Telehealth: Payer: Self-pay | Admitting: Cardiology

## 2021-05-05 NOTE — Telephone Encounter (Signed)
° °  Name: RANDY WHITENER  DOB: 1946/04/13  MRN: [id number]  Primary Cardiologist: Buford Dresser, MD  Chart reviewed as part of pre-operative protocol coverage. Because of Makyiah Lie Laton's past medical history and time since last visit, she will require a follow-up visit in order to better assess preoperative cardiovascular risk.  Given extremely cardiac history with multiple comorbidities (heart failure, atrial fib/flutter, ventricular tachycardia, BiV-ICD in place, CAD s/p CABG, rheumatic MV disease with prior mitral valve surgery, thoracic aortic aneurysm, advanced kidney disease), recommend OV for pre-op optimization prior to clearing. Also had device report come through in 02/2021 pending EP re-eval. Recommend clearance appt be with general cardiology team, but also need to help patient get in for overdue EP f/u as well.   Pre-op covering staff: - Please schedule appointment and call patient to inform them. - Please contact requesting surgeon's office via preferred method (i.e, phone, fax) to inform them of need for appointment prior to surgery.  This message will also be routed to pharmacy pool for input on holding anticoagulant (warfarin) as requested below so that this information is available to the clearing provider at time of patient's appointment.   Charlie Pitter, PA-C  05/05/2021, 1:57 PM

## 2021-05-05 NOTE — Telephone Encounter (Signed)
° °  Pre-operative Risk Assessment    Patient Name: Jacqueline Palmer  DOB: 13-Nov-1945 MRN: [id number]      Request for Surgical Clearance    Procedure:   Right hip replacement  Date of Surgery:  Clearance TBD                                 Surgeon:  Dr. Frederik Pear Surgeon's Group or Practice Name:  [facility] Phone number:  [phone] Fax number:  [phone]   Type of Clearance Requested:   - Medical  - Pharmacy:  Hold Warfarin (Coumadin)     Type of Anesthesia:  Spinal   Additional requests/questions:  deferring how long to hold blood thinner  [name]   05/05/2021, 11:36 AM

## 2021-05-05 NOTE — Telephone Encounter (Signed)
Pt has been scheduled to see Laurann Montana, NP 05/30/21 @ 2:45 for pre op clearance. Ok per NP to use provider use time slot. Pt is grateful for the appt. Will put pt on wait list as well. Will send notes to NP for upcoming appt. Will send FYI to requesting office pt pt has appt .

## 2021-05-06 NOTE — Telephone Encounter (Addendum)
Patient with diagnosis of A Fib with bioprosthetic mitral valve replacement with rheumatic heart disease on warfarin for anticoagulation.    Procedure: Right hip replacement Date of procedure: TBD  CHA2DS2-VASc Score = 6  This indicates a 9.7% annual risk of stroke. The patient's score is based upon: CHF History: 1 HTN History: 1 Diabetes History: 0 Stroke History: 0 Vascular Disease History: 1 Age Score: 2 Gender Score: 1   CrCl 24 mL/min Platelet count 255K  Per office protocol, patient can hold warfarin  for 5 days prior to procedure.    Patient WILL need bridging with Lovenox (enoxaparin) around procedure.  Will route to Dr Harrell Gave to see if she has a different recommendation

## 2021-05-10 ENCOUNTER — Ambulatory Visit: Payer: Medicare Other | Admitting: Internal Medicine

## 2021-05-11 ENCOUNTER — Telehealth: Payer: Self-pay | Admitting: Oncology

## 2021-05-11 NOTE — Telephone Encounter (Signed)
Scheduled per sch msg. Called and spoke with patient. Confirmed appt  

## 2021-05-12 ENCOUNTER — Ambulatory Visit (INDEPENDENT_AMBULATORY_CARE_PROVIDER_SITE_OTHER): Payer: Medicare Other

## 2021-05-12 ENCOUNTER — Other Ambulatory Visit: Payer: Self-pay

## 2021-05-12 DIAGNOSIS — I482 Chronic atrial fibrillation, unspecified: Secondary | ICD-10-CM

## 2021-05-12 DIAGNOSIS — Z5181 Encounter for therapeutic drug level monitoring: Secondary | ICD-10-CM

## 2021-05-12 DIAGNOSIS — Z7901 Long term (current) use of anticoagulants: Secondary | ICD-10-CM

## 2021-05-12 LAB — POCT INR: INR: 1.4 — AB (ref 2.0–3.0)

## 2021-05-12 NOTE — Patient Instructions (Signed)
Take 2 tablets today only and then increase warfarin to 1 tablet daily, except 1.5 tablets on Monday and Friday. Recheck INR in 2 weeks. Coumadin Clinic [phone]. Stay consistent with greens.

## 2021-05-13 ENCOUNTER — Ambulatory Visit: Payer: Medicare Other | Admitting: Oncology

## 2021-05-13 ENCOUNTER — Other Ambulatory Visit: Payer: Medicare Other

## 2021-05-18 ENCOUNTER — Ambulatory Visit (HOSPITAL_COMMUNITY)
Admission: RE | Admit: 2021-05-18 | Discharge: 2021-05-18 | Disposition: A | Discharge status: Home / Self Care | Payer: Medicare Other | Source: Ambulatory Visit | Attending: Internal Medicine | Admitting: Internal Medicine

## 2021-05-18 ENCOUNTER — Other Ambulatory Visit: Payer: Self-pay

## 2021-05-18 DIAGNOSIS — C7931 Secondary malignant neoplasm of brain: Secondary | ICD-10-CM | POA: Diagnosis present

## 2021-05-18 IMAGING — MR MR HEAD WO/W CM
13 of 14 series · 40 of 48 positions shown · IV contrast (Contrast agent)
Comparison: Head MRI [DATE]

CLINICAL DATA: Brain/CNS neoplasm, assess treatment response.
History of renal cell carcinoma with brain metastases. Prior
postoperative SRS in [DATE] and subsequent salvage SRS in [DATE].
Interval treatment of a new left frontal metastasis with SRS on
[DATE].

EXAM:
MRI HEAD WITHOUT AND WITH CONTRAST
TECHNIQUE: Multiplanar, multiecho pulse sequences of the brain and surrounding
structures were obtained without and with intravenous contrast.
CONTRAST:  10mL GADAVIST GADOBUTROL 1 MMOL/ML IV SOLN

[Series 5: FLAIR · sagittal · 3.0mm · 0.78mm/px · 2 of 36 slices shown (1 of 3)]
[im 1/36]
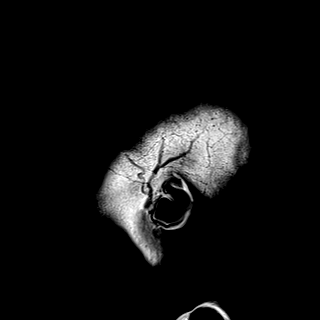
[im 36/36]
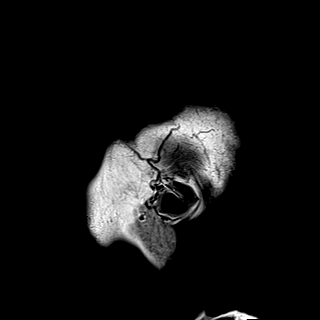

[Series 6: DWI · axial · 3.0mm · 0.88mm/px · z∈[-105,+42]mm · 5 of 100 slices shown (1 of 2)]
[im 1/100]
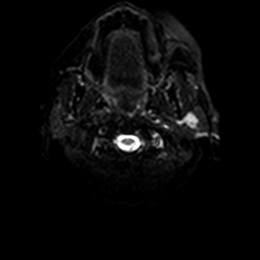
[im 25/100]
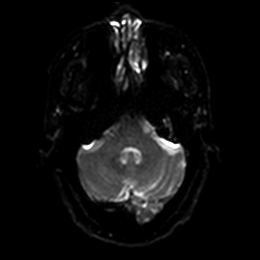
[im 50/100]
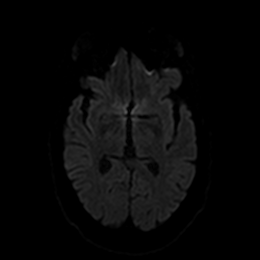
[im 75/100]
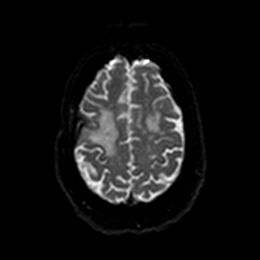
[im 100/100]
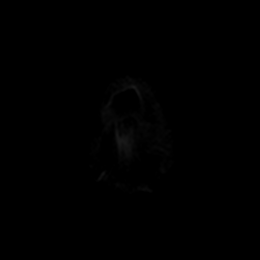

[Series 7: DWI · axial · 3.0mm · 0.88mm/px · z∈[-105,+42]mm · 2 of 50 slices shown (2 of 2)]
[im 1/50]
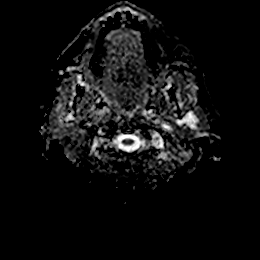
[im 50/50]
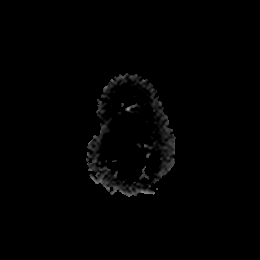

[Series 8: T2 · axial · 5.0mm · 0.78mm/px · 1 of 25 slices shown (1 of 2)]
[im 1/25]
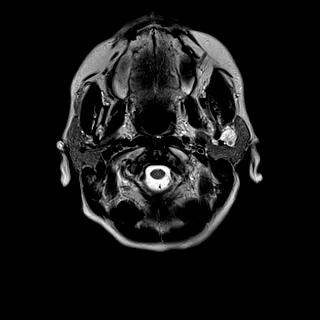

[Series 9: FLAIR · axial · 2.0mm · 0.49mm/px · z∈[-121,+59]mm · 4 of 76 slices shown (2 of 3)]
[im 1/76]
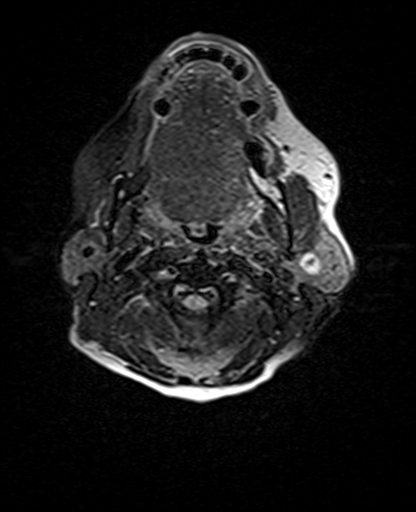
[im 26/76]
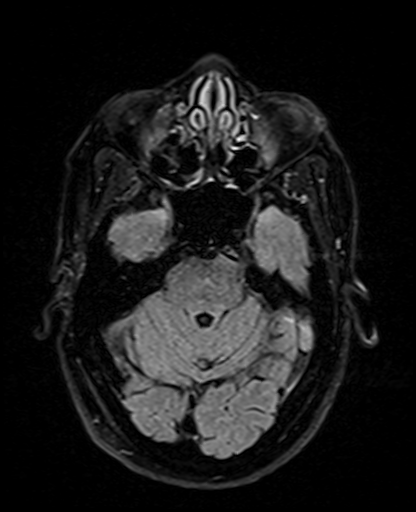
[im 51/76]
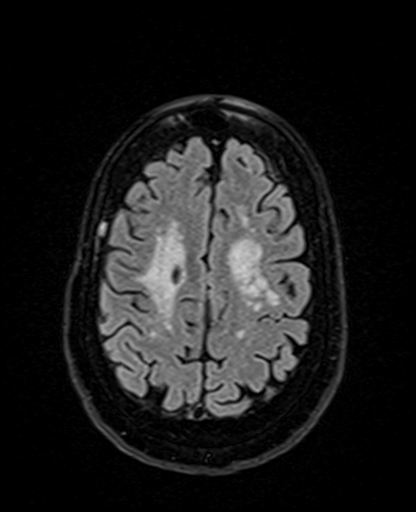
[im 76/76]
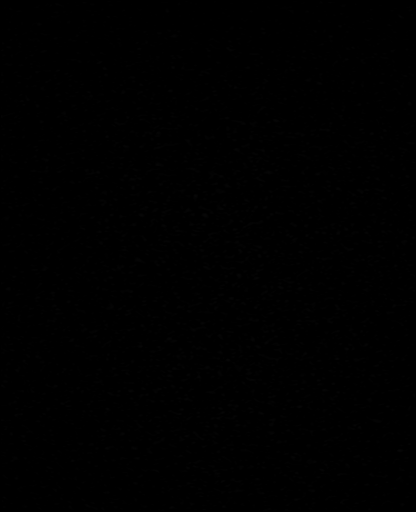

[Series 10: mag_images · axial · 3.0mm · 0.98mm/px · z∈[-120,+57]mm · 3 of 60 slices shown]
[im 1/60]
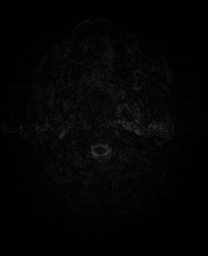
[im 30/60]
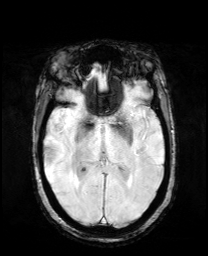
[im 60/60]
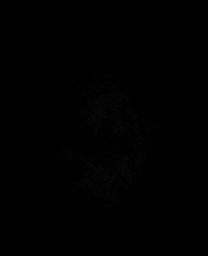

[Series 11: pha_images · axial · 3.0mm · 0.98mm/px · z∈[-120,+57]mm · 3 of 60 slices shown]
[im 1/60]
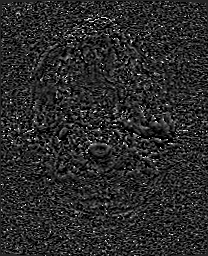
[im 30/60]
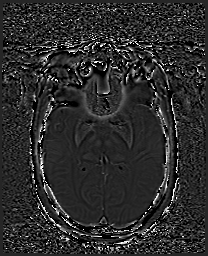
[im 60/60]
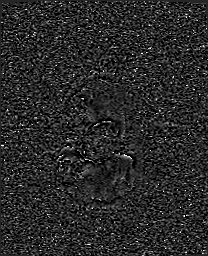

[Series 12: swi_images · axial · 3.0mm · 0.98mm/px · z∈[-120,+57]mm · 3 of 60 slices shown]
[im 1/60]
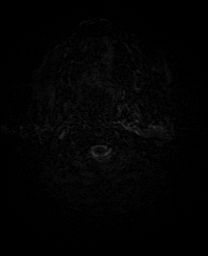
[im 30/60]
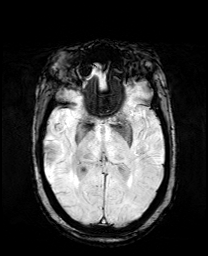
[im 60/60]
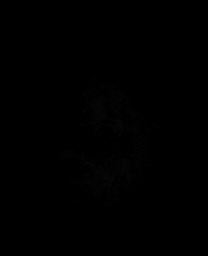

[Series 13: mip_images(sw) · axial · 24.0mm · 0.98mm/px · z∈[-110,+46]mm · 3 of 53 slices shown]
[im 1/53]
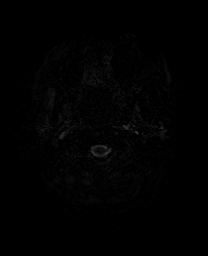
[im 27/53]
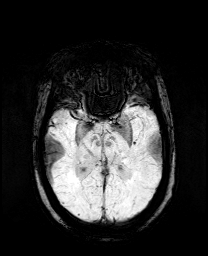
[im 53/53]
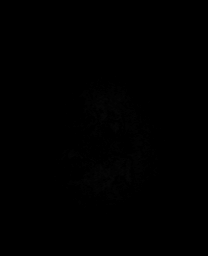

[Series 14: t1_mprage_tra_p2_iso no angle · axial · 1.0mm · 0.98mm/px · z∈[-119,+56]mm · 8 of 175 slices shown]
[im 1/175]
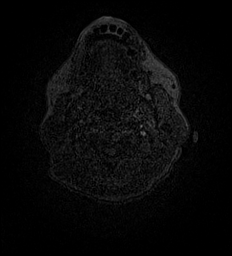
[im 25/175]
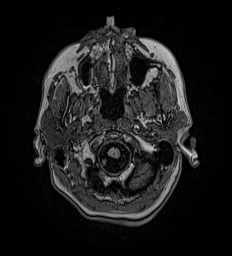
[im 50/175]
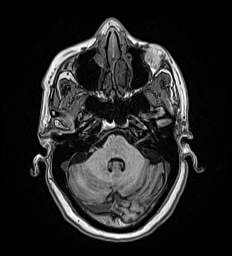
[im 75/175]
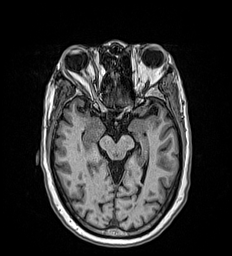
[im 100/175]
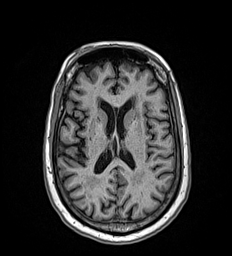
[im 125/175]
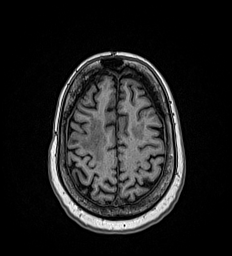
[im 150/175]
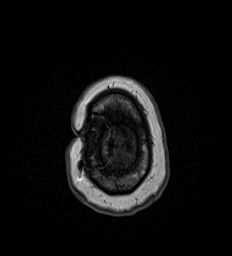
[im 175/175]
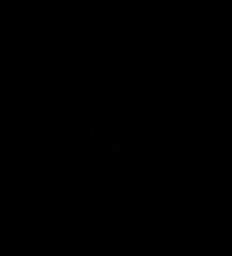

[Series 15: T2 · coronal · 3.0mm · 0.34mm/px · 2 of 48 slices shown (2 of 2)]
[im 1/48]
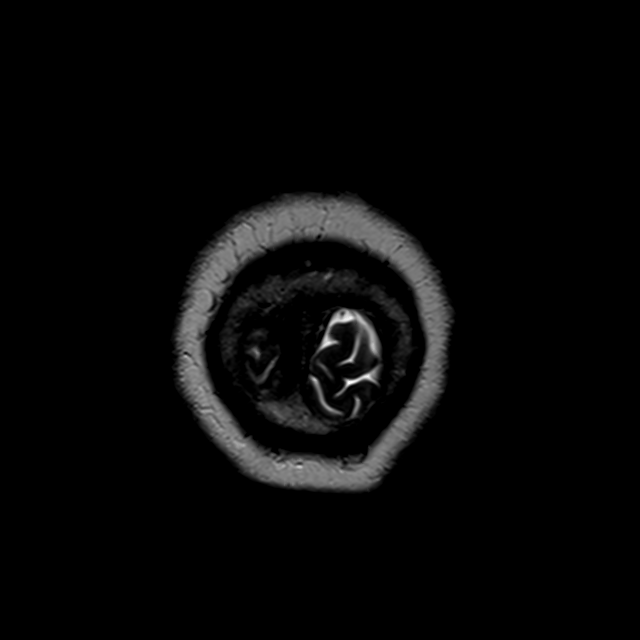
[im 48/48]
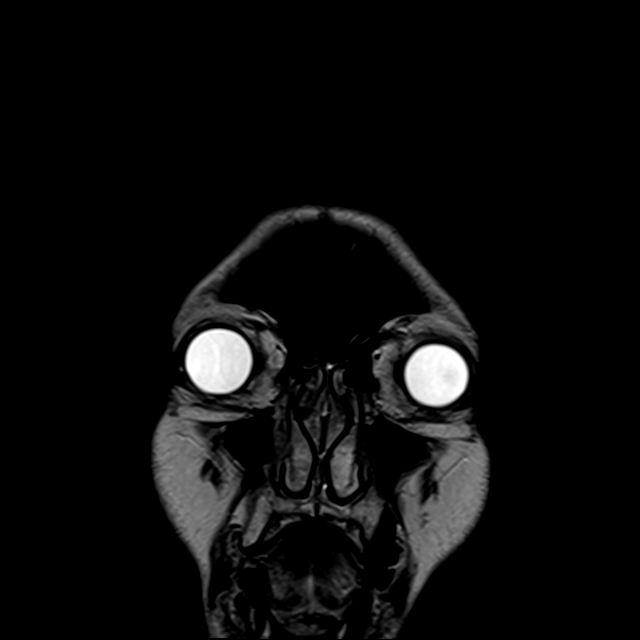

[Series 17: T1 post-contrast · coronal · 3.0mm · 0.34mm/px · 2 of 50 slices shown]
[im 1/50]
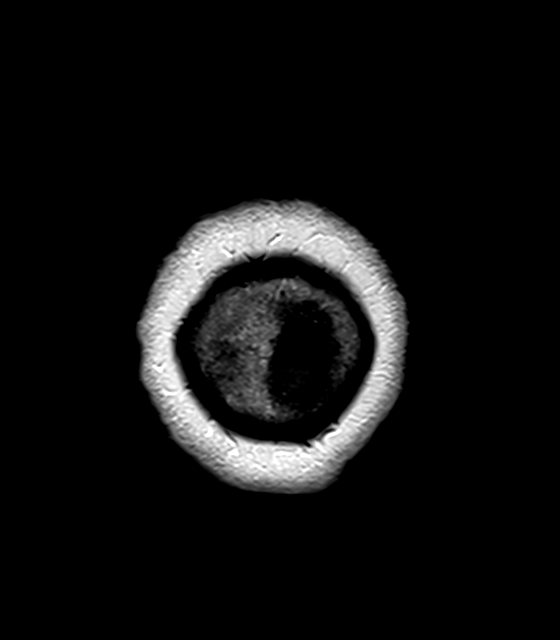
[im 50/50]
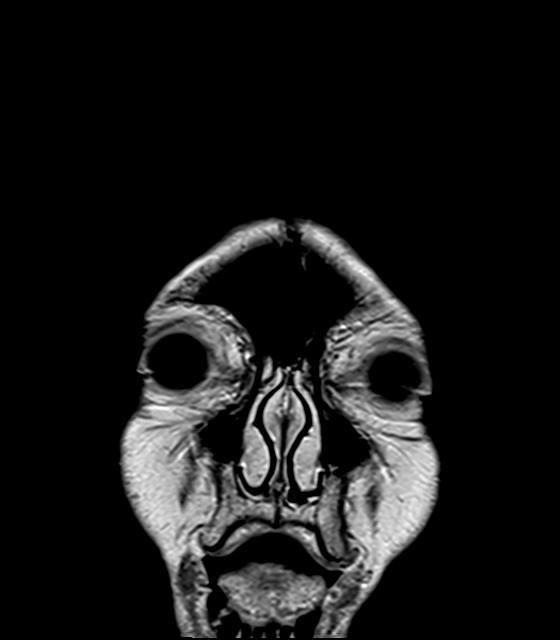

[Series 18: FLAIR · sagittal · 3.0mm · 0.72mm/px · 2 of 36 slices shown (3 of 3)]
[im 1/36]
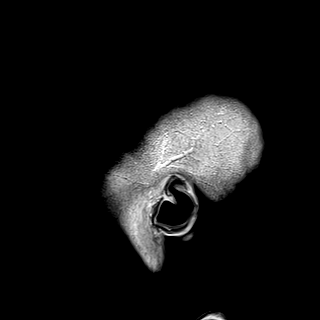
[im 36/36]
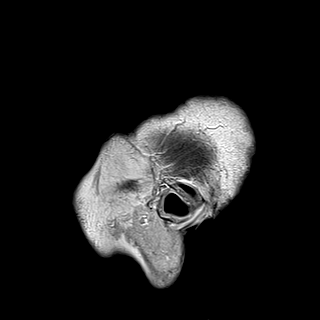

[40 of 48 positions shown; findings below may reference images not displayed]

FINDINGS: Brain:

New lesions: None.

Larger lesions: None.

Stable or smaller lesions:

1. Decreased size of the anterior left frontal metastasis following
interval treatment, now measuring 2 mm with new associated chronic
blood products (series 16, image 97).

Other brain findings: A resection cavity containing chronic blood
products in the high posterior right frontal lobe demonstrates
unchanged non-masslike enhancement and unchanged nonenhancing T2
hyperintensity in the surrounding white matter. Patchy to confluent
T2 hyperintensities elsewhere in the cerebral white matter
bilaterally are also unchanged, moderately advanced for age, and
nonspecific but may reflect a combination of chronic small vessel
ischemia and post treatment changes. Chronic lacunar infarcts are
again noted bilaterally in the basal ganglia, thalami, and
cerebellum. Scattered chronic microhemorrhages are again noted in
both cerebral hemispheres. A 1.7 cm focus of nonenhancing T2
hyperintensity involving cortex and juxtacortical white matter at
the right temporoparietal junction is unchanged with mild gyral
enlargement (series 9, image 41). There is no acute infarct, midline
shift, or significant extra-axial fluid collection. There is mild
cerebral atrophy.

Vascular: Major intracranial vascular flow voids are preserved.

Skull and upper cervical spine: Increased size of the enhancing
right frontal calvarial lesion which now measures 10 mm and extends
through the inner table to the dura (series 16, image 116).

Sinuses/Orbits: Unchanged bilateral proptosis and depression of the
right lamina papyracea. Paranasal sinuses and mastoid air cells are
clear.

Other: None.
IMPRESSION: 1. Decreased size of anterior left frontal metastasis following
interval treatment.
2. Increased size of the right frontal calvarial lesion which
extends to the dura and is consistent with a metastasis.
3. Unchanged appearance of the right frontal lobe resection site
without evidence of local tumor recurrence.
4. Unchanged indeterminate focus of nonenhancing cortical signal
abnormality at the right temporoparietal junction. Low-grade glioma
and focal cortical dysplasia are differential considerations, and
close follow-up is recommended.

## 2021-05-18 MED ORDER — GADOBUTROL 1 MMOL/ML IV SOLN
10.0000 mL | Freq: Once | INTRAVENOUS | Status: AC | PRN
  Administered 2021-05-18: 13:00:00 10 mL via INTRAVENOUS

## 2021-05-18 NOTE — Progress Notes (Signed)
Per order, changed device setting to VOO 85 for MRI scan with [place] rep, Joey.  Will monitor patient during MRI scan.  After scan will program back to regular settings.

## 2021-05-24 ENCOUNTER — Inpatient Hospital Stay: Payer: Medicare Other

## 2021-05-24 ENCOUNTER — Inpatient Hospital Stay: Payer: Medicare Other | Attending: Oncology | Admitting: Oncology

## 2021-05-24 ENCOUNTER — Other Ambulatory Visit: Payer: Self-pay

## 2021-05-24 VITALS — BP 113/77 | HR 71 | Temp 97.3°F | Resp 17 | Wt 187.7 lb

## 2021-05-24 DIAGNOSIS — C649 Malignant neoplasm of unspecified kidney, except renal pelvis: Secondary | ICD-10-CM

## 2021-05-24 DIAGNOSIS — M25551 Pain in right hip: Secondary | ICD-10-CM | POA: Diagnosis not present

## 2021-05-24 DIAGNOSIS — I110 Hypertensive heart disease with heart failure: Secondary | ICD-10-CM | POA: Diagnosis not present

## 2021-05-24 DIAGNOSIS — M545 Low back pain, unspecified: Secondary | ICD-10-CM | POA: Diagnosis not present

## 2021-05-24 DIAGNOSIS — Z90710 Acquired absence of both cervix and uterus: Secondary | ICD-10-CM | POA: Diagnosis not present

## 2021-05-24 DIAGNOSIS — N2889 Other specified disorders of kidney and ureter: Secondary | ICD-10-CM | POA: Diagnosis not present

## 2021-05-24 DIAGNOSIS — Z87891 Personal history of nicotine dependence: Secondary | ICD-10-CM | POA: Insufficient documentation

## 2021-05-24 DIAGNOSIS — C7931 Secondary malignant neoplasm of brain: Secondary | ICD-10-CM | POA: Diagnosis not present

## 2021-05-24 DIAGNOSIS — G9389 Other specified disorders of brain: Secondary | ICD-10-CM

## 2021-05-24 DIAGNOSIS — N289 Disorder of kidney and ureter, unspecified: Secondary | ICD-10-CM | POA: Insufficient documentation

## 2021-05-24 DIAGNOSIS — R918 Other nonspecific abnormal finding of lung field: Secondary | ICD-10-CM

## 2021-05-24 DIAGNOSIS — I509 Heart failure, unspecified: Secondary | ICD-10-CM | POA: Insufficient documentation

## 2021-05-24 LAB — CBC WITH DIFFERENTIAL (CANCER CENTER ONLY)
Abs Immature Granulocytes: 0.01 10*3/uL (ref 0.00–0.07)
Basophils Absolute: 0.1 10*3/uL (ref 0.0–0.1)
Basophils Relative: 1 %
Eosinophils Absolute: 0.2 10*3/uL (ref 0.0–0.5)
Eosinophils Relative: 4 %
HCT: 36 % (ref 36.0–46.0)
Hemoglobin: 11.6 g/dL — ABNORMAL LOW (ref 12.0–15.0)
Immature Granulocytes: 0 %
Lymphocytes Relative: 39 %
Lymphs Abs: 2.2 10*3/uL (ref 0.7–4.0)
MCH: 24.1 pg — ABNORMAL LOW (ref 26.0–34.0)
MCHC: 32.2 g/dL (ref 30.0–36.0)
MCV: 74.8 fL — ABNORMAL LOW (ref 80.0–100.0)
Monocytes Absolute: 0.8 10*3/uL (ref 0.1–1.0)
Monocytes Relative: 15 %
Neutro Abs: 2.3 10*3/uL (ref 1.7–7.7)
Neutrophils Relative %: 41 %
Platelet Count: 262 10*3/uL (ref 150–400)
RBC: 4.81 MIL/uL (ref 3.87–5.11)
RDW: 15.2 % (ref 11.5–15.5)
WBC Count: 5.5 10*3/uL (ref 4.0–10.5)
nRBC: 0 % (ref 0.0–0.2)

## 2021-05-24 LAB — CMP (CANCER CENTER ONLY)
ALT: 14 U/L (ref 0–44)
AST: 19 U/L (ref 15–41)
Albumin: 4.4 g/dL (ref 3.5–5.0)
Alkaline Phosphatase: 81 U/L (ref 38–126)
Anion gap: 9 (ref 5–15)
BUN: 41 mg/dL — ABNORMAL HIGH (ref 8–23)
CO2: 27 mmol/L (ref 22–32)
Calcium: 9.7 mg/dL (ref 8.9–10.3)
Chloride: 103 mmol/L (ref 98–111)
Creatinine: 2.93 mg/dL — ABNORMAL HIGH (ref 0.44–1.00)
GFR, Estimated: 16 mL/min — ABNORMAL LOW (ref >60)
Glucose, Bld: 72 mg/dL (ref 70–99)
Potassium: 4.2 mmol/L (ref 3.5–5.1)
Sodium: 139 mmol/L (ref 135–145)
Total Bilirubin: 0.6 mg/dL (ref 0.3–1.2)
Total Protein: 7.6 g/dL (ref 6.5–8.1)

## 2021-05-24 NOTE — Progress Notes (Signed)
Hematology and Oncology Follow Up Visit  Jacqueline Palmer [id number] 11-10-1945 76 y.o. 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 ber 2020.  She developed a stage IV disease with isolated brain metastasis that was documented in January 2021.    Prior Therapy:  He status post right frontal craniotomy and resection of a solitary brain metastasis in March 2021.  He received postoperative SRS at [facility].  He status post a radical nephrectomy on Oct 16, 2019 with a T3a N0 disease.  The final pathology showed undetermined subtype with nuclear grade 3.  She is status post repeat stereotactic radiosurgery under the care of by Dr. Lisbeth Renshaw and Dr. Kathyrn Sheriff on March 03, 2020.  This was completed after she developed a 2.0 cm mass of the right frontal lobe indicating recurrent disease on MRI on February 03, 2020.  She is status post stereotactic radiosurgery under the care of Dr. Lisbeth Renshaw completed on February 08, 2021 for an isolated left frontal lobe detected in September 2022 and MRI.  Current therapy: Active surveillance.  Interim History: Jacqueline Palmer returns today for a follow-up visit.  Since last visit, she reports no major changes in her health.  She continues to have chronic hip pain and considering having hip replacement.  She is ambulating short distances with the help of a cane as well as a walker.  She denies any falls or syncope.  She denies any headaches, blurry vision or neurological deficits.     Medications: Updated on review. Current Outpatient Medications  Medication Sig Dispense Refill   acetaminophen (TYLENOL) 500 MG tablet Take 1,000 mg by mouth in the morning and at bedtime.     colchicine 0.6 MG tablet Take 1 tablet (0.6 mg total) by mouth at bedtime. 90 tablet 3   hydrALAZINE (APRESOLINE) 10  MG tablet TAKE ONE TABLET BY MOUTH THREE TIMES DAILY 270 tablet 2   levETIRAcetam (KEPPRA) 500 MG tablet Take 1 tablet (500 mg total) by mouth 2 (two) times daily. 60 tablet 0   levothyroxine (SYNTHROID) 25 MCG tablet TAKE ONE TABLET BY MOUTH ONE TIME DAILY 30 to 60 minutes before breakfast on an empty stomach with a full glass of water 90 tablet 2   metoprolol succinate (TOPROL-XL) 25 MG 24 hr tablet Take 1 tablet (25 mg total) by mouth daily. 90 tablet 3   nitroGLYCERIN (NITROSTAT) 0.4 MG SL tablet Place 1 tablet (0.4 mg total) under the tongue every 5 (five) minutes as needed for chest pain. 25 tablet 3   polyethylene glycol (MIRALAX / GLYCOLAX) 17 g packet Take 17 g by mouth daily as needed for mild constipation.     pravastatin (PRAVACHOL) 20 MG tablet Take 20 mg by mouth at bedtime.     spironolactone (ALDACTONE) 25 MG tablet Take 1 tablet (25 mg total) by mouth daily. 90 tablet 3   torsemide (DEMADEX) 20 MG tablet TAKE TWO TABLETS BY MOUTH IN THE MORNING AND ONE TABLET IN THE EVENING 90 tablet 2   warfarin (COUMADIN) 5 MG tablet Take 1 tablet (5 mg total) by mouth every evening. 90 tablet 1   No current facility-administered medications for this visit.     Allergies:  Allergies  Allergen Reactions   Avocado Shortness Of Breath    Before 2007   Banana Anaphylaxis and Swelling   Plantain Anaphylaxis and Swelling  Ace Inhibitors Cough   Codeine Other (See Comments)    Nausea and Feels Jittery   Prednisone Swelling    Of face and lower extremities   Tape Hives    Tape  Does better with paper tape  Tape  Does better with paper tape     Latex Rash      Physical Exam:  Blood pressure 113/77, pulse 71, temperature (!) 97.3 F (36.3 C), temperature source Temporal, resp. rate 17, weight 187 lb 11.2 oz (85.1 kg), SpO2 100 %.    ECOG: 1   General appearance: Alert, awake without any distress. Head: Atraumatic without abnormalities Oropharynx: Without any thrush or  ulcers. Eyes: No scleral icterus. Lymph nodes: No lymphadenopathy noted in the cervical, supraclavicular, or axillary nodes Heart:regular rate and rhythm, without any murmurs or gallops.   Lung: Clear to auscultation without any rhonchi, wheezes or dullness to percussion. Abdomin: Soft, nontender without any shifting dullness or ascites. Musculoskeletal: No clubbing or cyanosis. Neurological: No motor or sensory deficits. Skin: No rashes or lesions.        Lab Results: Lab Results  Component Value Date   WBC 6.3 02/01/2021   HGB 12.5 02/01/2021   HCT 38.9 02/01/2021   MCV 73.1 (L) 02/01/2021   PLT 255 02/01/2021     Chemistry      Component Value Date/Time   NA 137 02/01/2021 1250   NA 141 12/14/2020 1053   K 4.2 02/01/2021 1250   CL 101 02/01/2021 1250   CO2 24 02/01/2021 1250   BUN 43 (H) 02/01/2021 1250   BUN 45 (H) 12/14/2020 1053   CREATININE 2.74 (H) 02/01/2021 1250      Component Value Date/Time   CALCIUM 10.1 02/01/2021 1250   ALKPHOS 88 02/01/2021 1250   AST 21 02/01/2021 1250   ALT 16 02/01/2021 1250   BILITOT 0.8 02/01/2021 1250       IMPRESSION: 1. Decreased size of anterior left frontal metastasis following interval treatment. 2. Increased size of the right frontal calvarial lesion which extends to the dura and is consistent with a metastasis. 3. Unchanged appearance of the right frontal lobe resection site without evidence of local tumor recurrence. 4. Unchanged indeterminate focus of nonenhancing cortical signal abnormality at the right temporoparietal junction. Low-grade glioma and focal cortical dysplasia are differential considerations, and close follow-up is recommended.   Impression and Plan:  76 year old woman with:   1.    Stage IV papillary renal cell cancer diagnosed in 2020 with CNS involvement.    She is status post therapy outlined above without any evidence of systemic metastasis.  Risks and benefits of systemic treatment  were reiterated at this time.  These options including immunotherapy, oral targeted therapy or combination of both were reviewed.  Given the lack of systemic disease systemic therapy impact on her CNS metastasis and duodenal involvement remains questionable at this time.  I prefer to defer the systemic therapy options include she has systemic disease for the time being.  We will update her staging scans before the next visit.   2.  Renal insufficiency: Her kidney function remains at baseline and will continue to monitor.   3.  CNS metastasis: MRI of the brain obtained on May 18, 2021 was personally reviewed and showed increase in the size of the calvarial lesion of.  She will have evaluation by Dr. Lisbeth Renshaw for additional radiation.    4.  Follow-up: She will return in 3 months for repeat imaging studies.  30  minutes were dedicated to this visit.  The time was spent on reviewing laboratory data, disease status update and outlining future plan of care.      Zola Button, MD 1/3/20238:09 AM

## 2021-05-26 ENCOUNTER — Other Ambulatory Visit: Payer: Self-pay

## 2021-05-26 ENCOUNTER — Ambulatory Visit (INDEPENDENT_AMBULATORY_CARE_PROVIDER_SITE_OTHER): Payer: Medicare Other

## 2021-05-26 DIAGNOSIS — Z7901 Long term (current) use of anticoagulants: Secondary | ICD-10-CM

## 2021-05-26 DIAGNOSIS — I482 Chronic atrial fibrillation, unspecified: Secondary | ICD-10-CM

## 2021-05-26 DIAGNOSIS — Z5181 Encounter for therapeutic drug level monitoring: Secondary | ICD-10-CM | POA: Diagnosis not present

## 2021-05-26 LAB — POCT INR: INR: 3.6 — AB (ref 2.0–3.0)

## 2021-05-26 NOTE — Telephone Encounter (Signed)
Agree with bridging with lovenox given increased risk of thromboembolism with rheumatic afib

## 2021-05-26 NOTE — Patient Instructions (Signed)
HOLD TONIGHT ONLY and then continue taking  warfarin 1 tablet daily, except 1.5 tablets on Monday and Friday. Recheck INR in 2 weeks. [facility] [phone]. Stay consistent with greens.

## 2021-05-30 ENCOUNTER — Encounter (HOSPITAL_BASED_OUTPATIENT_CLINIC_OR_DEPARTMENT_OTHER): Payer: Self-pay | Admitting: Nurse Practitioner

## 2021-05-30 ENCOUNTER — Other Ambulatory Visit: Payer: Self-pay

## 2021-05-30 ENCOUNTER — Ambulatory Visit (INDEPENDENT_AMBULATORY_CARE_PROVIDER_SITE_OTHER): Payer: Medicare Other | Admitting: Nurse Practitioner

## 2021-05-30 VITALS — BP 112/75 | HR 70 | Ht 68.0 in | Wt 189.2 lb

## 2021-05-30 DIAGNOSIS — Z952 Presence of prosthetic heart valve: Secondary | ICD-10-CM | POA: Diagnosis not present

## 2021-05-30 DIAGNOSIS — Z7901 Long term (current) use of anticoagulants: Secondary | ICD-10-CM | POA: Diagnosis not present

## 2021-05-30 DIAGNOSIS — I2510 Atherosclerotic heart disease of native coronary artery without angina pectoris: Secondary | ICD-10-CM | POA: Diagnosis not present

## 2021-05-30 DIAGNOSIS — I5032 Chronic diastolic (congestive) heart failure: Secondary | ICD-10-CM | POA: Diagnosis not present

## 2021-05-30 NOTE — Patient Instructions (Signed)
Medication Instructions:  Your physician recommends that you continue on your current medications as directed. Please refer to the Current Medication list given to you today.  *If you need a refill on your cardiac medications before your next appointment, please call your pharmacy*   Lab Work: None Ordered If you have labs (blood work) drawn today and your tests are completely normal, you will receive your results only by: [place] (if you have MyChart) OR A paper copy in the mail If you have any lab test that is abnormal or we need to change your treatment, we will call you to review the results.   Testing/Procedures: None Ordered   Follow-Up: At [facility], you and your health needs are our priority.  As part of our continuing mission to provide you with exceptional heart care, we have created designated Provider Care Teams.  These Care Teams include your primary Cardiologist (physician) and Advanced Practice Providers (APPs -  Physician Assistants and Nurse Practitioners) who all work together to provide you with the care you need, when you need it.   Your next appointment:   2 month(s)  The format for your next appointment:   In Person  Provider:   Buford Dresser, MD    Other Instructions Keep your appointment with Cou[facility]in Clinic on 06/09/21 for instructions on bridging Lovenox for your upcoming surgery.

## 2021-05-30 NOTE — Progress Notes (Signed)
Office Visit    Patient Name: Jacqueline Palmer Date of Encounter: 05/30/2021  PCP:  Ladell Pier, MD   [place]  Cardiologist:  Buford Dresser, MD  Advanced Practice Provider:  No care team member to display Electrophysiologist:  Vickie Epley, MD     Chief Complaint    Jacqueline Palmer is a 76 y.o. female with a hx of hypertension, coronary artery disease s/p CABG X4, maze, LAA ligation, bioprosthetic MVR (rheumatic heart disease) 6-28/2017, prone atrial fibrillation, chronic diastolic heart failure with recovered systolic dysfunction, s/p BiV-ICD, hypothyroidism, CKD 4, metastatic renal cancer s/p radical nephrectomy and craniotomy for resection of brain tumor presents today for preoperative cardiovascular clearance  Past Medical History    Past Medical History:  Diagnosis Date   Acute kidney injury ([place])    Atrial fibrillation ([place])    Cardiomyopathy ([place])    CHF (congestive heart failure) ([place])    Chronic a-fib ([place]) 08/14/2016   Chronic anticoagulation 08/14/2016   Coronary artery disease    4v CABG 6/17   Hypertension    MVR 77mm Edwards Perimount Plus Bioprosthetic 11/17/2015   SN [id number] Model [id number] [From patient's surgical info card]   Thrombus 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 myocardial infarction    Thyroid disease    Past Surgical History:  Procedure Laterality Date   ABDOMINAL HYSTERECTOMY     CORONARY ARTERY BYPASS GRAFT     CRANIOTOMY Right 07/30/2019   Right frontal   NEPHRECTOMY Right 10/16/2019    Allergies  Allergies  Allergen Reactions   Avocado Shortness Of Breath    Before 2007   Banana Anaphylaxis and Swelling   Plantain Anaphylaxis and Swelling   Ace Inhibitors Cough   Codeine Other (See Comments)    Nausea and Feels Jittery   Prednisone Swelling    Of face and lower extremities   Tape Hives    Tape  Does better with paper tape  Tape  Does better with paper tape     Latex Rash     History of Present Illness    Jacqueline Palmer is a 76 y.o. female with a hx of hypertension, coronary artery disease s/p CABG X4, maze, LAA ligation, bioprosthetic MVR (rheumatic heart disease) 6-28/2017, prone atrial fibrillation, chronic diastolic heart failure with recovered systolic dysfunction, s/p BiV-ICD, hypothyroidism, CKD 4, metastatic renal cancer s/p radical nephrectomy and craniotomy for resection of brain tumor.    She previously followed Dr. Westley Gambles at [facility] for BiV-ICD.  Prior AV node ablation 09/10/2016 at [facility].  She established with Dr. Harrell Gave September 2021.  Most recent echocardiogram 11/2020 LVEF 60 to 65%, no R WMA, normal PASP, LA moderately dilated, RA mildly dilated, mitral valve s/p intervention with mean gradient 3 mmHg, tricuspid aortic valve with trivial AI, mild dilation ascending aorta 42 mm. She was last seen in our office by Dr. Harrell Gave on 01/18/21 and 6 mo follow-up was recommended.   She presents today for cardiac clearance for right hip replacement with [facility] Dr. Mayer Camel.  Per review by Dr. Harrell Gave as well as pharmacy team given CHA2DS2-VASc of 6, creatinine clearance 24 mL/min, platelet count 255K she may hold Warfarin 5 days prior but will require bridging with Lovenox. She is followed by [facility] at [facility] and has an upcoming appointment on 06/09/21 for bridging instructions for upcoming surgery. She denies chest pain, shortness of breath, lower extremity edema, fatigue, palpitations, melena, hematuria, hemoptysis, diaphoresis, weakness, presyncope, syncope,  orthopnea, and PND. Reports she has occasional lightheadedness when going from sitting to standing but has gotten accustomed to taking her time getting up and using her cane. Reports home BP readings are consistent with 112/75.  She is having activity limited by the hip pain but otherwise does her own housework.  She is working with PT several times per week. She denies specific cardiac  complaints today.   EKGs/Labs/Other Studies Reviewed:   The following studies were reviewed today:  Echo 11/2020  1. Left ventricular ejection fraction, by estimation, is 60 to 65%. The  left ventricle has normal function. The left ventricle has no regional  wall motion abnormalities. Left ventricular diastolic function could not  be evaluated.   2. Right ventricular systolic function is normal. The right ventricular  size is not well visualized. There is normal pulmonary artery systolic  pressure. The estimated right ventricular systolic pressure is 56.3 mmHg.   3. Left atrial size was moderately dilated.   4. Right atrial size was mildly dilated.   5. The mitral valve has been repaired/replaced. No evidence of mitral  valve regurgitation. The mean mitral valve gradient is 3.0 mmHg. There is  a present in the mitral position.   6. The aortic valve is tricuspid. Aortic valve regurgitation is trivial.  No aortic stenosis is present.   7. Aortic dilatation noted. There is mild dilatation of the ascending  aorta, measuring 42 mm.   8. The inferior vena cava is normal in size with greater than 50%  respiratory variability, suggesting right atrial pressure of 3 mmHg.   Comparison(s): Prior images unable to be directly viewed, comparison made  by report only.   EKG:  EKG is  ordered today.  The ekg ordered today demonstrates ventricular paced rhythm at 70 bpm, no significant change from previous  Recent Labs: 12/14/2020: TSH 3.380 05/24/2021: ALT 14; BUN 41; Creatinine 2.93; Hemoglobin 11.6; Platelet Count 262; Potassium 4.2; Sodium 139  Recent Lipid Panel No results found for: CHOL, TRIG, HDL, CHOLHDL, VLDL, LDLCALC, LDLDIRECT  Risk Assessment/Calculations:   CHA2DS2-VASc Score = 6   This indicates a 9.7% annual risk of stroke. The patient's score is based upon: CHF History: 1 HTN History: 1 Diabetes History: 0 Stroke History: 0 Vascular Disease History: 1 Age Score: 2 Gender  Score: 1    Home Medications   Current Meds  Medication Sig   acetaminophen (TYLENOL) 500 MG tablet Take 1,000 mg by mouth in the morning and at bedtime.   colchicine 0.6 MG tablet Take 1 tablet (0.6 mg total) by mouth at bedtime.   hydrALAZINE (APRESOLINE) 10 MG tablet TAKE ONE TABLET BY MOUTH THREE TIMES DAILY   levETIRAcetam (KEPPRA) 500 MG tablet Take 1 tablet (500 mg total) by mouth 2 (two) times daily.   levothyroxine (SYNTHROID) 25 MCG tablet TAKE ONE TABLET BY MOUTH ONE TIME DAILY 30 to 60 minutes before breakfast on an empty stomach with a full glass of water   metoprolol succinate (TOPROL-XL) 25 MG 24 hr tablet Take 1 tablet (25 mg total) by mouth daily.   nitroGLYCERIN (NITROSTAT) 0.4 MG SL tablet Place 1 tablet (0.4 mg total) under the tongue every 5 (five) minutes as needed for chest pain.   polyethylene glycol (MIRALAX / GLYCOLAX) 17 g packet Take 17 g by mouth daily as needed for mild constipation.   pravastatin (PRAVACHOL) 20 MG tablet Take 20 mg by mouth at bedtime.   spironolactone (ALDACTONE) 25 MG tablet Take 1 tablet (25  mg total) by mouth daily.   torsemide (DEMADEX) 20 MG tablet TAKE TWO TABLETS BY MOUTH IN THE MORNING AND ONE TABLET IN THE EVENING   traMADol (ULTRAM) 50 MG tablet Take 50 mg by mouth every 6 (six) hours as needed.   warfarin (COUMADIN) 5 MG tablet Take 1 tablet (5 mg total) by mouth every evening.     Review of Systems      All other systems reviewed and are otherwise negative except as noted above.  Physical Exam    VS:  BP 112/75 (BP Location: Right Arm, Patient Position: Sitting, Cuff Size: Large)    Pulse 70    Ht 5\' 8"  (1.727 m)    Wt 189 lb 3.2 oz (85.8 kg)    BMI 28.77 kg/m  , BMI Body mass index is 28.77 kg/m.  Wt Readings from Last 3 Encounters:  05/30/21 189 lb 3.2 oz (85.8 kg)  05/24/21 187 lb 11.2 oz (85.1 kg)  04/19/21 191 lb 6.4 oz (86.8 kg)    GEN: Well nourished, well developed, in no acute distress. HEENT: normal. Neck:  Supple, no JVD, carotid bruits, or masses. Cardiac: RRR, murmurs, rubs, or gallops. No clubbing, cyanosis, edema.  Radials/PT 2+ and equal bilaterally.  Respiratory:  Respirations regular and unlabored, clear to auscultation bilaterally. GI: Soft, nontender, nondistended. MS: No deformity or atrophy. Skin: Warm and dry, no rash. Neuro:  Strength and sensation are intact. Psych: Normal affect.  Assessment & Plan    Preoperative cardiovascular clearance -  May hold warfarin 5 days prior to planned procedure.  Will require bridging with Lovenox per review of pharmacist and Dr. Harrell Gave.  Next Coumadin clinic appointment 06/09/2021. According to the Revised Cardiac Risk Index (RCRI), her Perioperative Risk of Major Cardiac Event is (%): 11 Her Functional Capacity in METs is: 5.07 according to the [employer] Activity Status Index (DASI).  She may proceed with surgery without further cardiovascular evaluation.  Will send note to Dr. Mayer Camel with message that Lovenox bridging will be addressed on 06/09/21.   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 Follows with Dr. Quentin Ore for BiV-ICD.  CHA2DS2-VASc Score = 6 [CHF History: 1, HTN History: 1, Diabetes History: 0, Stroke History: 0, Vascular Disease History: 1, Age Score: 2, Gender Score: 1].  Therefore, the patient's annual risk of stroke is 9.7 %..  Continue Coumadin.  Management by [facility].      Nonischemic cardiomyopathy /chronic diastolic heart failure / Rheumatic MV disease s/p MVR - She appears euvolemic on exam today.  She denies worsening activity tolerance, dyspnea, orthopnea, PND. Continue spironolactone, metoprolol, torsemide, coumadin.   HLD, LDL goal <70 - LDL 64 at [facility] 04/11/2019.  We will recheck at next office visit.  Continue pravastatin.  Thoracic aortic aneurysm (11/2020 85mm) - CT of chest 02/10/2021 revealed stable  aortic dilatation.  She has frequent imaging of chest for renal cell carcinoma, kidney mass, lung mass.  We will continue to follow.  CAD s/p CABG x4 - She denies chest pain, dyspnea, or other symptoms concerning for angina.  She has not taken nitroglycerin recently.  She is not on aspirin due to chronic Coumadin therapy. Continue metoprolol, coumadin.   CKD IV / renal cancer with brain metastases s/p right radical nephrectomy - Last GFR 16 (Stage 4) on 05/24/21.  Her torsemide was reduced to 20 mg twice a day, she thinks by nephrology.  On hydralazine for blood pressure control.  No ACEi/ARB given single kidney and CKD.  Blood pressure stable today. Continue spironolactone    Disposition: Follow up in 2 month(s) with Buford Dresser, MD or APP.  Signed, Emmaline Life, NP 05/30/2021, 1:38 PM [place]

## 2021-06-02 ENCOUNTER — Inpatient Hospital Stay (HOSPITAL_BASED_OUTPATIENT_CLINIC_OR_DEPARTMENT_OTHER): Payer: Medicare Other | Admitting: Internal Medicine

## 2021-06-02 ENCOUNTER — Ambulatory Visit (INDEPENDENT_AMBULATORY_CARE_PROVIDER_SITE_OTHER): Payer: Medicare Other

## 2021-06-02 ENCOUNTER — Other Ambulatory Visit: Payer: Self-pay

## 2021-06-02 ENCOUNTER — Telehealth: Payer: Self-pay | Admitting: Oncology

## 2021-06-02 VITALS — BP 123/78 | HR 70 | Temp 97.3°F | Resp 20 | Wt 188.4 lb

## 2021-06-02 DIAGNOSIS — C649 Malignant neoplasm of unspecified kidney, except renal pelvis: Secondary | ICD-10-CM | POA: Diagnosis not present

## 2021-06-02 DIAGNOSIS — C7931 Secondary malignant neoplasm of brain: Secondary | ICD-10-CM

## 2021-06-02 DIAGNOSIS — I428 Other cardiomyopathies: Secondary | ICD-10-CM

## 2021-06-02 NOTE — Telephone Encounter (Signed)
Sch per 1/3 los, pt aware °

## 2021-06-02 NOTE — Progress Notes (Signed)
[facility] [place], [place] [address] [phone]   Interval Evaluation  Date of Service: 06/02/21 Patient Name: Jacqueline Palmer Patient MRN: [id number] Patient DOB: 04-Jan-1946 Provider: Ventura Sellers, MD  Identifying Statement:  Jacqueline Palmer is a 76 y.o. female with Brain metastasis [facility]) [C79.31]   Primary Cancer: Renal Cell Carcinoma, Stage IV  Oncologic History: 07/30/19: Right frontal craniotomy, resection of solitary brain metastasis at [facility] 10/06/19: Post-operative SRS at [facility] (Dr. Tyrone Nine) 03/09/20: Salvage SRS to R frontal+LMD 27/3x 02/08/21: Salvage SRS to L frontal Lisbeth Renshaw)  Interval History:  Jacqueline Palmer presents today for follow up after recent MRI brain, now 3+ months removed from most recent course of radiosurgery.  No new or progressive complaints today.  No recent seizures, continues to take Keppra 500mg  twice per day.  Continues to experience frequent pain in right hip and leg, lower back.  Ortho has recommended a hip replacement.  Continues to be active mentally and engaging socially.  H+P (02/24/20) Patient presents to review recent changes on brain MRI.  She initially presented in March 2021 with a seizure, described as "left side clenching up, lost awareness for a few minutes".  CNS imaging demonstrated an enhancing right frontal mass, c/w likely metastasis from renal cell carcinoma.  Craniotomy was performed at [facility] and followed with surgical bed radiosurgery at [facility].  She did have one recurrence of seizure activity in late August, at which time she was restarted on Keppra.  Unclear on why this was discontinued.  Recent MRI demonstrated some change and she presents today for treatment recommendations.  No neurologic complaints aside from seizures.  Medications: Current Outpatient Medications on File Prior to Visit  Medication Sig Dispense Refill   acetaminophen (TYLENOL) 500 MG tablet Take 1,000 mg  by mouth in the morning and at bedtime.     colchicine 0.6 MG tablet Take 1 tablet (0.6 mg total) by mouth at bedtime. 90 tablet 3   hydrALAZINE (APRESOLINE) 10 MG tablet TAKE ONE TABLET BY MOUTH THREE TIMES DAILY 270 tablet 2   levETIRAcetam (KEPPRA) 500 MG tablet Take 1 tablet (500 mg total) by mouth 2 (two) times daily. 60 tablet 0   levothyroxine (SYNTHROID) 25 MCG tablet TAKE ONE TABLET BY MOUTH ONE TIME DAILY 30 to 60 minutes before breakfast on an empty stomach with a full glass of water 90 tablet 2   metoprolol succinate (TOPROL-XL) 25 MG 24 hr tablet Take 1 tablet (25 mg total) by mouth daily. 90 tablet 3   nitroGLYCERIN (NITROSTAT) 0.4 MG SL tablet Place 1 tablet (0.4 mg total) under the tongue every 5 (five) minutes as needed for chest pain. 25 tablet 3   polyethylene glycol (MIRALAX / GLYCOLAX) 17 g packet Take 17 g by mouth daily as needed for mild constipation.     pravastatin (PRAVACHOL) 20 MG tablet Take 20 mg by mouth at bedtime.     spironolactone (ALDACTONE) 25 MG tablet Take 1 tablet (25 mg total) by mouth daily. 90 tablet 3   torsemide (DEMADEX) 20 MG tablet Take 20 mg by mouth 2 (two) times daily.     traMADol (ULTRAM) 50 MG tablet Take 50 mg by mouth every 6 (six) hours as needed.     warfarin (COUMADIN) 5 MG tablet Take 1 tablet (5 mg total) by mouth every evening. 90 tablet 1   No current facility-administered medications on file prior to visit.    Allergies:  Allergies  Allergen Reactions   Avocado Shortness Of Breath    Before 2007   Banana Anaphylaxis and Swelling   Plantain Anaphylaxis and Swelling   Ace Inhibitors Cough   Codeine Other (See Comments)    Nausea and Feels Jittery   Prednisone Swelling    Of face and lower extremities   Tape Hives    Tape  Does better with paper tape  Tape  Does better with paper tape     Latex Rash   Past Medical History:  Past Medical History:  Diagnosis Date   Acute kidney injury ([place])    Atrial fibrillation  ([place])    Cardiomyopathy ([place])    CHF (congestive heart failure) ([place])    Chronic a-fib ([place]) 08/14/2016   Chronic anticoagulation 08/14/2016   Coronary artery disease    4v CABG 6/17   Hypertension    MVR 51mm Edwards Perimount Plus Bioprosthetic 11/17/2015   SN [id number] Model [id number] [From patient's surgical info card]   Thrombus 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 myocardial infarction    Thyroid disease    Past Surgical History:  Past Surgical History:  Procedure Laterality Date   ABDOMINAL HYSTERECTOMY     CORONARY ARTERY BYPASS GRAFT     CRANIOTOMY Right 07/30/2019   Right frontal   NEPHRECTOMY Right 10/16/2019   Social History:  Social History   Socioeconomic History   Marital status: Widowed    Spouse name: Not on file   Number of children: 0   Years of education: Doctorate   Highest education level: Master's degree (e.g., MA, MS, MEng, MEd, MSW, MBA)  Occupational History   Occupation: Retired Clinical biochemist  Tobacco Use   Smoking status: Former    Types: Cigarettes    Quit date: 07/30/2014    Years since quitting: 6.8   Smokeless tobacco: Never  Vaping Use   Vaping Use: Never used  Substance and Sexual Activity   Alcohol use: Yes    Comment: 1 glass wine twice weekly   Drug use: No   Sexual activity: Not on file  Other Topics Concern   Not on file  Social History Narrative   Right handed   Coffee daily/tea qod   Social Determinants of Health   Financial Resource Strain: Not on file  Food Insecurity: Not on file  Transportation Needs: Not on file  Physical Activity: Not on file  Stress: Not on file  Social Connections: Not on file  Intimate Partner Violence: Not on file   Family History:  Family History  Problem Relation Age of Onset   Heart attack Mother    Heart attack Father    Heart attack Sister    Breast cancer Neg Hx     Review of Systems: Constitutional: Doesn't report fevers, chills or abnormal weight loss Eyes: Doesn't report  blurriness of vision Ears, nose, mouth, throat, and face: Doesn't report sore throat Respiratory: Doesn't report cough, dyspnea or wheezes Cardiovascular: Doesn't report palpitation, chest discomfort  Gastrointestinal:  Doesn't report nausea, constipation, diarrhea GU: Doesn't report incontinence Skin: Doesn't report skin rashes Neurological: Per HPI Musculoskeletal: Doesn't report joint pain Behavioral/Psych: Doesn't report anxiety  Physical Exam: Vitals:   06/02/21 1015  BP: 123/78  Pulse: 70  Resp: 20  Temp: (!) 97.3 F (36.3 C)  SpO2: 100%    KPS: 80. General: Alert, cooperative, pleasant, in no acute distress Head: Normal EENT: No conjunctival injection or scleral icterus.  Lungs: Resp effort normal Cardiac: Regular rate Abdomen: Non-distended  abdomen Skin: No rashes cyanosis or petechiae. Extremities: No clubbing or edema  Neurologic Exam: Mental Status: Awake, alert, attentive to examiner. Oriented to self and environment. Language is fluent with intact comprehension.  Cranial Nerves: Visual acuity is grossly normal. Visual fields are full. Extra-ocular movements intact. No ptosis. Face is symmetric Motor: Tone and bulk are normal. Power is full in both arms and legs. Reflexes are symmetric, no pathologic reflexes present.  Sensory: Intact to light touch Gait: Orthopedic limitations  Labs: I have reviewed the data as listed    Component Value Date/Time   NA 139 05/24/2021 0814   NA 141 12/14/2020 1053   K 4.2 05/24/2021 0814   CL 103 05/24/2021 0814   CO2 27 05/24/2021 0814   GLUCOSE 72 05/24/2021 0814   BUN 41 (H) 05/24/2021 0814   BUN 45 (H) 12/14/2020 1053   CREATININE 2.93 (H) 05/24/2021 0814   CALCIUM 9.7 05/24/2021 0814   PROT 7.6 05/24/2021 0814   ALBUMIN 4.4 05/24/2021 0814   AST 19 05/24/2021 0814   ALT 14 05/24/2021 0814   ALKPHOS 81 05/24/2021 0814   BILITOT 0.6 05/24/2021 0814   GFRNONAA 16 (L) 05/24/2021 0814   GFRAA 30 (L) 01/20/2020  1433   Lab Results  Component Value Date   WBC 5.5 05/24/2021   NEUTROABS 2.3 05/24/2021   HGB 11.6 (L) 05/24/2021   HCT 36.0 05/24/2021   MCV 74.8 (L) 05/24/2021   PLT 262 05/24/2021    Imaging:  [place] Clinician Interpretation: I have personally reviewed the CNS images as listed.  My interpretation, in the context of the patient's clinical presentation, is stable disease  MR BRAIN W WO CONTRAST  Result Date: 05/19/2021 CLINICAL DATA:  Brain/CNS neoplasm, assess treatment response. History of renal cell carcinoma with brain metastases. Prior resection of a right frontal brain metastasis in 07/2019 followed by postoperative SRS in 09/2019 and subsequent salvage SRS in 02/2020. Interval treatment of a new left frontal metastasis with SRS on 02/08/2021. EXAM: MRI HEAD WITHOUT AND WITH CONTRAST TECHNIQUE: Multiplanar, multiecho pulse sequences of the brain and surrounding structures were obtained without and with intravenous contrast. CONTRAST:  18mL GADAVIST GADOBUTROL 1 MMOL/ML IV SOLN COMPARISON:  Head MRI 01/28/2021 FINDINGS: Brain: New lesions: None. Larger lesions: None. Stable or smaller lesions: 1. Decreased size of the anterior left frontal metastasis following interval treatment, now measuring 2 mm with new associated chronic blood products (series 16, image 97). Other brain findings: A resection cavity containing chronic blood products in the high posterior right frontal lobe demonstrates unchanged non-masslike enhancement and unchanged nonenhancing T2 hyperintensity in the surrounding white matter. Patchy to confluent T2 hyperintensities elsewhere in the cerebral white matter bilaterally are also unchanged, moderately advanced for age, and nonspecific but may reflect a combination of chronic small vessel ischemia and post treatment changes. Chronic lacunar infarcts are again noted bilaterally in the basal ganglia, thalami, and cerebellum. Scattered chronic microhemorrhages are again noted  in both cerebral hemispheres. A 1.7 cm focus of nonenhancing T2 hyperintensity involving cortex and juxtacortical white matter at the right temporoparietal junction is unchanged with mild gyral enlargement (series 9, image 41). There is no acute infarct, midline shift, or significant extra-axial fluid collection. There is mild cerebral atrophy. Vascular: Major intracranial vascular flow voids are preserved. Skull and upper cervical spine: Increased size of the enhancing right frontal calvarial lesion which now measures 10 mm and extends through the inner table to the dura (series 16, image 116). Sinuses/Orbits: Unchanged bilateral  proptosis and depression of the right lamina papyracea. Paranasal sinuses and mastoid air cells are clear. Other: None. IMPRESSION: 1. Decreased size of anterior left frontal metastasis following interval treatment. 2. Increased size of the right frontal calvarial lesion which extends to the dura and is consistent with a metastasis. 3. Unchanged appearance of the right frontal lobe resection site without evidence of local tumor recurrence. 4. Unchanged indeterminate focus of nonenhancing cortical signal abnormality at the right temporoparietal junction. Low-grade glioma and focal cortical dysplasia are differential considerations, and close follow-up is recommended. Electronically Signed   By: Logan Bores M.D.   On: 05/19/2021 10:23     Assessment/Plan Brain metastasis [facility]) [C79.31]  Jacqueline Palmer is clinically stable today.  Brain MRI demonstrates stability of previously treated parenchymal metastases, and no additional lesions.    She does have a small skull based lesion which is slightly larger.  Still not on systemic therapy with Dr. Alen Blew.  Restaging scans due in April.  Recommended continuing Keppra at 500mg  BID.  Ok with orthopedic interventions planned, from CNS standpoint.  We appreciate the opportunity to participate in the care of Jacqueline Palmer.    We ask  that Jacqueline Palmer return to clinic in 3 months following next brain MRI, or sooner as needed.  All questions were answered. The patient knows to call the clinic with any problems, questions or concerns. No barriers to learning were detected.  The total time spent in the encounter was 30 minutes and more than 50% was on counseling and review of test results   Ventura Sellers, MD Medical Director of Neuro-Oncology [facility] at [place] 06/02/21 10:02 AM

## 2021-06-05 LAB — CUP PACEART REMOTE DEVICE CHECK
Battery Remaining Longevity: 48 mo
Battery Remaining Percentage: 57 %
Brady Statistic RA Percent Paced: 0 %
Brady Statistic RV Percent Paced: 100 %
Date Time Interrogation Session: 20230113103600
HighPow Impedance: 73 Ohm
Implantable Lead Implant Date: 20180423
Implantable Lead Implant Date: 20180423
Implantable Lead Implant Date: 20180423
Implantable Lead Location: 753858
Implantable Lead Location: 753859
Implantable Lead Location: 753860
Implantable Lead Model: 292
Implantable Lead Model: 4672
Implantable Lead Model: 7740
Implantable Lead Serial Number: 431793
Implantable Lead Serial Number: 602825
Implantable Lead Serial Number: 703305
Implantable Pulse Generator Implant Date: 20180423
Lead Channel Impedance Value: 1087 Ohm
Lead Channel Impedance Value: 374 Ohm
Lead Channel Impedance Value: 478 Ohm
Lead Channel Pacing Threshold Amplitude: 0.5 V
Lead Channel Pacing Threshold Amplitude: 0.7 V
Lead Channel Pacing Threshold Pulse Width: 0.4 ms
Lead Channel Pacing Threshold Pulse Width: 0.4 ms
Lead Channel Setting Pacing Amplitude: 1.6 V
Lead Channel Setting Pacing Amplitude: 2 V
Lead Channel Setting Pacing Pulse Width: 0.4 ms
Lead Channel Setting Pacing Pulse Width: 0.4 ms
Lead Channel Setting Sensing Sensitivity: 0.6 mV
Lead Channel Setting Sensing Sensitivity: 1 mV
Pulse Gen Serial Number: 174046

## 2021-06-07 ENCOUNTER — Ambulatory Visit (HOSPITAL_BASED_OUTPATIENT_CLINIC_OR_DEPARTMENT_OTHER): Payer: Medicare Other | Admitting: Family

## 2021-06-08 ENCOUNTER — Telehealth: Payer: Self-pay | Admitting: *Deleted

## 2021-06-08 ENCOUNTER — Encounter: Payer: Self-pay | Admitting: Internal Medicine

## 2021-06-08 NOTE — Progress Notes (Signed)
Patient seen 05/26/2021 by Dr. Candiss Norse.  Assessment was CKD stage IV likely related to HTN/renal vascular disease now with solitary kidney.  Of note she did have CKD which predates her surgery. Metastatic renal cell carcinoma to the brain: Status post right nephrectomy, craniotomy, radiation, stereotactic radiosurgery. Anemia associated with chronic renal disease: Hemoglobin at goal Secondary hyperparathyroidism and renal disease. Creatinine 2.4 to/GFR 20 H/H11.2/33 PTH, intact 138

## 2021-06-08 NOTE — Telephone Encounter (Signed)
Faxed surgical clearance for rt hip arthroplasty signed by Dr. Alen Blew to [facility]. Fax confirmation received.  Copy of form sent to HIM to be scanned into chart

## 2021-06-09 ENCOUNTER — Ambulatory Visit (INDEPENDENT_AMBULATORY_CARE_PROVIDER_SITE_OTHER): Payer: Medicare Other

## 2021-06-09 ENCOUNTER — Other Ambulatory Visit: Payer: Self-pay

## 2021-06-09 DIAGNOSIS — Z5181 Encounter for therapeutic drug level monitoring: Secondary | ICD-10-CM | POA: Diagnosis not present

## 2021-06-09 DIAGNOSIS — Z7901 Long term (current) use of anticoagulants: Secondary | ICD-10-CM

## 2021-06-09 DIAGNOSIS — I482 Chronic atrial fibrillation, unspecified: Secondary | ICD-10-CM

## 2021-06-09 LAB — POCT INR: INR: 2.5 (ref 2.0–3.0)

## 2021-06-09 NOTE — Patient Instructions (Signed)
continue taking  warfarin 1 tablet daily, except 1.5 tablets on Monday and Friday. Recheck INR in 4 weeks. Coumadin Clinic [phone].

## 2021-06-13 ENCOUNTER — Other Ambulatory Visit: Payer: Self-pay | Admitting: Orthopedic Surgery

## 2021-06-14 NOTE — Progress Notes (Signed)
Remote ICD transmission.   

## 2021-06-21 ENCOUNTER — Ambulatory Visit (INDEPENDENT_AMBULATORY_CARE_PROVIDER_SITE_OTHER): Payer: Medicare Other | Admitting: Family Medicine

## 2021-06-21 ENCOUNTER — Telehealth: Payer: Self-pay | Admitting: Family Medicine

## 2021-06-21 ENCOUNTER — Encounter: Payer: Self-pay | Admitting: Family Medicine

## 2021-06-21 VITALS — BP 121/82 | HR 74 | Ht 68.0 in | Wt 188.0 lb

## 2021-06-21 DIAGNOSIS — C7931 Secondary malignant neoplasm of brain: Secondary | ICD-10-CM | POA: Diagnosis not present

## 2021-06-21 DIAGNOSIS — R569 Unspecified convulsions: Secondary | ICD-10-CM | POA: Diagnosis not present

## 2021-06-21 MED ORDER — LEVETIRACETAM 500 MG PO TABS: 500.0000 mg | ORAL_TABLET | Freq: Two times a day (BID) | ORAL | 3 refills | Status: DC

## 2021-06-21 NOTE — Telephone Encounter (Signed)
Can you guys contact her pharmacy and let me know who is filling her levetiracetam? I will update notes pending response. TY!

## 2021-06-21 NOTE — Progress Notes (Signed)
Chief Complaint  Patient presents with   Follow-up    Rm 11, alone. Here for yearly f/u. Pt reports doing well. Pt reports on the 10th of feb she will be having R hip replacement. Using a cane to ambulate      HISTORY OF PRESENT ILLNESS:  06/21/21 ALL:  Jacqueline Palmer is a 76 y.o. female here today for follow up for seizures in setting of  right frontal brain mets. She remains on Keppra 500 gm BID. She is doing well. No seizures. She lives in [place]. She does not drive. She continues to see oncology regularly. She has MRI every three months for monitoring. She is followed by cardiology for atrial fib. S/P pace maker. She reports doing well from cardiovascular standpoint. She is planning to have right hip replacement 07/11/21. She is using cane for stability.  HISTORY (copied from previous note) 06/16/20 RS: She is a 76 year old woman who had a seizure 07/25/2019 right after her second Covid-19 shot (during 15 minute observation). The seizure began with not being able to speak or move (she recalls this) but not being unresponsive.  Then she lost consciousness and regained it while in the hospital (in ED) but she does not remember the ambulance ride or earlier in the ED.   She was seen at [facility] in [place].  Though we do not have reports or images, she was found to have a tumor.   She had a craniotomy to remove the tumor.  It was found to be a metastasis from a renal cell carcinoma.   She had a nephrectomy.   Follow-up MRI showed leptomeningeal thickening with enhancement on the right as well as a meningeal based mass and she then had radiation to the brain.    She is seeing Dr. Mickeal Skinner and Dr. Lisbeth Renshaw of neuro-oncology and radiation oncology.   After the first seizure, she was placed on Keppra 500 mg bid did well.  However, after about 4 months she discontinued the medication and had another seizure August 2021.  She went back on the Keppra 500 mg and has been seizure-free since.  She is  back to driving now.   Images reviewed: 02/20/2020 MRI of the brain shows encephalomalacia in the right frontal lobe associated with craniotomy consistent with her prior tumor removal.  There is a dural based extra-axial mass measuring 24 mm in longest diameter and leptomeningeal spread.   06/15/2020 MRI of the brain shows right frontal lobe craniotomy and encephalomalacia.  The dural based mass and dural enhancement noted on the 02/20/2020 MRI has resolved.  REVIEW OF SYSTEMS: Out of a complete 14 system review of symptoms, the patient complains only of the following symptoms, right hip and knee pain and all other reviewed systems are negative.   ALLERGIES: Allergies  Allergen Reactions   Avocado Shortness Of Breath    Before 2007   Banana Anaphylaxis and Swelling   Plantain Anaphylaxis and Swelling   Ace Inhibitors Cough   Codeine Other (See Comments)    Nausea and Feels Jittery   Prednisone Swelling    Of face and lower extremities   Tape Hives    Tape  Does better with paper tape  Tape  Does better with paper tape     Latex Rash     HOME MEDICATIONS: Outpatient Medications Prior to Visit  Medication Sig Dispense Refill   acetaminophen (TYLENOL) 500 MG tablet Take 1,000 mg by mouth in the morning and  at bedtime.     colchicine 0.6 MG tablet Take 1 tablet (0.6 mg total) by mouth at bedtime. 90 tablet 3   hydrALAZINE (APRESOLINE) 10 MG tablet TAKE ONE TABLET BY MOUTH THREE TIMES DAILY 270 tablet 2   levothyroxine (SYNTHROID) 25 MCG tablet TAKE ONE TABLET BY MOUTH ONE TIME DAILY 30 to 60 minutes before breakfast on an empty stomach with a full glass of water 90 tablet 2   metoprolol succinate (TOPROL-XL) 25 MG 24 hr tablet Take 1 tablet (25 mg total) by mouth daily. 90 tablet 3   nitroGLYCERIN (NITROSTAT) 0.4 MG SL tablet Place 1 tablet (0.4 mg total) under the tongue every 5 (five) minutes as needed for chest pain. 25 tablet 3   polyethylene glycol (MIRALAX / GLYCOLAX) 17 g  packet Take 17 g by mouth daily as needed for mild constipation.     pravastatin (PRAVACHOL) 20 MG tablet Take 20 mg by mouth at bedtime.     spironolactone (ALDACTONE) 25 MG tablet Take 1 tablet (25 mg total) by mouth daily. 90 tablet 3   torsemide (DEMADEX) 20 MG tablet Take 20 mg by mouth 2 (two) times daily.     traMADol (ULTRAM) 50 MG tablet Take 50 mg by mouth every 6 (six) hours as needed.     warfarin (COUMADIN) 5 MG tablet Take 1 tablet (5 mg total) by mouth every evening. 90 tablet 1   levETIRAcetam (KEPPRA) 500 MG tablet Take 1 tablet (500 mg total) by mouth 2 (two) times daily. 60 tablet 0   No facility-administered medications prior to visit.     PAST MEDICAL HISTORY: Past Medical History:  Diagnosis Date   Acute kidney injury ([place])    Atrial fibrillation ([facility])    Cardiomyopathy ([place])    CHF (congestive heart failure) ([place])    Chronic a-fib ([place]) 08/14/2016   Chronic anticoagulation 08/14/2016   Coronary artery disease    4v CABG 6/17   Hypertension    MVR 53mm Edwards Perimount Plus Bioprosthetic 11/17/2015   SN [id number] Model 6900P [From patient's surgical info card]   Thrombus 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 myocardial infarction    Thyroid disease      PAST SURGICAL HISTORY: Past Surgical History:  Procedure Laterality Date   ABDOMINAL HYSTERECTOMY     CORONARY ARTERY BYPASS GRAFT     CRANIOTOMY Right 07/30/2019   Right frontal   NEPHRECTOMY Right 10/16/2019     FAMILY HISTORY: Family History  Problem Relation Age of Onset   Heart attack Mother    Heart attack Father    Heart attack Sister    Breast cancer Neg Hx      SOCIAL HISTORY: Social History   Socioeconomic History   Marital status: Widowed    Spouse name: Not on file   Number of children: 0   Years of education: Doctorate   Highest education level: Master's degree (e.g., MA, MS, MEng, MEd, MSW, MBA)  Occupational History   Occupation: Retired Clinical biochemist  Tobacco Use    Smoking status: Former    Types: Cigarettes    Quit date: 07/30/2014    Years since quitting: 6.8   Smokeless tobacco: Never  Vaping Use   Vaping Use: Never used  Substance and Sexual Activity   Alcohol use: Yes    Comment: 1 glass wine twice weekly   Drug use: No   Sexual activity: Not on file  Other Topics Concern   Not on file  Social History Narrative  Right handed   Coffee daily/tea qod   Social Determinants of Health   Financial Resource Strain: Not on file  Food Insecurity: Not on file  Transportation Needs: Not on file  Physical Activity: Not on file  Stress: Not on file  Social Connections: Not on file  Intimate Partner Violence: Not on file     PHYSICAL EXAM  Vitals:   06/21/21 0945  BP: 121/82  Pulse: 74  Weight: 188 lb (85.3 kg)  Height: 5\' 8"  (1.727 m)   Body mass index is 28.59 kg/m.  Generalized: Well developed, in no acute distress  Cardiology: normal rate and rhythm, no murmur auscultated  Respiratory: clear to auscultation bilaterally    Neurological examination  Mentation: Alert oriented to time, place, history taking. Follows all commands speech and language fluent Cranial nerve II-XII: Pupils were equal round reactive to light. Extraocular movements were full, visual field were full on confrontational test. Facial sensation and strength were normal. Head turning and shoulder shrug  were normal and symmetric. Motor: The motor testing reveals 5 over 5 strength of all 4 extremities. Good symmetric motor tone is noted throughout.   Gait and station: Gait is arthritic, stable with single prong cane     DIAGNOSTIC DATA (LABS, IMAGING, TESTING) - I reviewed patient records, labs, notes, testing and imaging myself where available.  Lab Results  Component Value Date   WBC 5.5 05/24/2021   HGB 11.6 (L) 05/24/2021   HCT 36.0 05/24/2021   MCV 74.8 (L) 05/24/2021   PLT 262 05/24/2021      Component Value Date/Time   NA 139 05/24/2021 0814    NA 141 12/14/2020 1053   K 4.2 05/24/2021 0814   CL 103 05/24/2021 0814   CO2 27 05/24/2021 0814   GLUCOSE 72 05/24/2021 0814   BUN 41 (H) 05/24/2021 0814   BUN 45 (H) 12/14/2020 1053   CREATININE 2.93 (H) 05/24/2021 0814   CALCIUM 9.7 05/24/2021 0814   PROT 7.6 05/24/2021 0814   ALBUMIN 4.4 05/24/2021 0814   AST 19 05/24/2021 0814   ALT 14 05/24/2021 0814   ALKPHOS 81 05/24/2021 0814   BILITOT 0.6 05/24/2021 0814   GFRNONAA 16 (L) 05/24/2021 0814   GFRAA 30 (L) 01/20/2020 1433   No results found for: CHOL, HDL, LDLCALC, LDLDIRECT, TRIG, CHOLHDL No results found for: HGBA1C No results found for: VITAMINB12 Lab Results  Component Value Date   TSH 3.380 12/14/2020    No flowsheet data found.   No flowsheet data found.   ASSESSMENT AND PLAN  76 y.o. year old female  has a past medical history of Acute kidney injury ([place]), Atrial fibrillation ([place]), Cardiomyopathy ([place]), CHF (congestive heart failure) ([place]), Chronic a-fib ([place]) (08/14/2016), Chronic anticoagulation (08/14/2016), Coronary artery disease, Hypertension, MVR 88mm Edwards Perimount Plus Bioprosthetic (11/17/2015), Thrombus 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 myocardial infarction, and Thyroid disease. here with    Brain metastasis ([place])  Seizures ([place])  Jacqueline Palmer is doing well on levetiracetam 500mg  BID. We will continue current treatment plan. She will continue close follow up with oncology, cardiology, PCP and ortho. Healthy lifestyle habits encouraged. She will follow up with neurology in 1 year.   No orders of the defined types were placed in this encounter.    Meds ordered this encounter  Medications   levETIRAcetam (KEPPRA) 500 MG tablet    Sig: Take 1 tablet (500 mg total) by mouth 2 (two) times daily.    Dispense:  180 tablet  Refill:  3    Order Specific Question:   Supervising Provider    Answer:   Melvenia Beam [[id number]]      FIE PPIRJ, MSN, FNP-C 06/21/2021, 12:12 PM  [facility] [address] [place], [place] [address] [phone]

## 2021-06-21 NOTE — Patient Instructions (Signed)
Below is our plan:  We will continue with the Keppra 500 mg BID.   Please make sure you are staying well hydrated. I recommend 50-60 ounces daily. Well balanced diet and regular exercise encouraged. Consistent sleep schedule with 6-8 hours recommended.   Please continue follow up with care team as directed.   Follow up with me or Dr. Felecia Shelling  in 1 year or sooner if needed.  You may receive a survey regarding today's visit. I encourage you to leave honest feed back as I do use this information to improve patient care. Thank you for seeing me today!

## 2021-06-21 NOTE — Telephone Encounter (Signed)
Called [facility] at [phone]. Spoke w/ Anderson Malta. Pt last filled med 04/01/2021 from Dr. Arlice Colt. No active rx on file anymore, rx expired. Before that, Pt refilled 01/2020 rx'd by Julian Hy.

## 2021-06-24 ENCOUNTER — Other Ambulatory Visit: Payer: Self-pay | Admitting: Radiation Therapy

## 2021-06-27 NOTE — Progress Notes (Signed)
Your procedure is scheduled on:            07/11/21.   Report to [facility] Main  Entrance   Report to admitting at  [place] AM     Call this number if you have problems the morning of surgery [phone]    REMEMBER: NO  SOLID FOOD CANDY OR GUM AFTER MIDNIGHT. CLEAR LIQUIDS UNTIL   0830am       . NOTHING BY MOUTH EXCEPT CLEAR LIQUIDS UNTIL  0830am  . PLEASE FINISH ENSURE DRINK PER SURGEON ORDER  WHICH NEEDS TO BE COMPLETED AT   0830am   .      CLEAR LIQUID DIET   Foods Allowed                                                                    Coffee and tea, regular and decaf                            Fruit ices (not with fruit pulp)                                      Iced Popsicles                                    Carbonated beverages, regular and diet                                    Cranberry, grape and apple juices Sports drinks like Gatorade Lightly seasoned clear broth or consume(fat free) Sugar, ___________________________________________________________________      BRUSH YOUR TEETH MORNING OF SURGERY AND RINSE YOUR MOUTH OUT, NO CHEWING GUM CANDY OR MINTS.     Take these medicines the morning of surgery with A SIP OF WATER:  hydralazine, keppra, synthoid, toprol    DO NOT TAKE ANY DIABETIC MEDICATIONS DAY OF YOUR SURGERY                               You may not have any metal on your body including hair pins and              piercings  Do not wear jewelry, make-up, lotions, powders or perfumes, deodorant             Do not wear nail polish on your fingernails.  Do not shave  48 hours prior to surgery.              Men may shave face and neck.   Do not bring valuables to the hospital. [place].  Contacts, dentures or bridgework may not be worn into surgery.  Leave suitcase in the car. After surgery it may be brought to your room.     Patients discharged the day  of surgery will  not be allowed to drive home. IF YOU ARE HAVING SURGERY AND GOING HOME THE SAME DAY, YOU MUST HAVE AN ADULT TO DRIVE YOU HOME AND BE WITH YOU FOR 24 HOURS. YOU MAY GO HOME BY TAXI OR UBER OR ORTHERWISE, BUT AN ADULT MUST ACCOMPANY YOU HOME AND STAY WITH YOU FOR 24 HOURS.  Name and phone number of your driver:  Special Instructions: N/A              Please read over the following fact sheets you were given: _____________________________________________________________________  [facility] - Preparing for Surgery Before surgery, you can play an important role.  Because skin is not sterile, your skin needs to be as free of germs as possible.  You can reduce the number of germs on your skin by washing with CHG (chlorahexidine gluconate) soap before surgery.  CHG is an antiseptic cleaner which kills germs and bonds with the skin to continue killing germs even after washing. Please DO NOT use if you have an allergy to CHG or antibacterial soaps.  If your skin becomes reddened/irritated stop using the CHG and inform your nurse when you arrive at Short Stay. Do not shave (including legs and underarms) for at least 48 hours prior to the first CHG shower.  You may shave your face/neck. Please follow these instructions carefully:  1.  Shower with CHG Soap the night before surgery and the  morning of Surgery.  2.  If you choose to wash your hair, wash your hair first as usual with your  normal  shampoo.  3.  After you shampoo, rinse your hair and body thoroughly to remove the  shampoo.                           4.  Use CHG as you would any other liquid soap.  You can apply chg directly  to the skin and wash                       Gently with a scrungie or clean washcloth.  5.  Apply the CHG Soap to your body ONLY FROM THE NECK DOWN.   Do not use on face/ open                           Wound or open sores. Avoid contact with eyes, ears mouth and genitals (private parts).                       Wash face,   Genitals (private parts) with your normal soap.             6.  Wash thoroughly, paying special attention to the area where your surgery  will be performed.  7.  Thoroughly rinse your body with warm water from the neck down.  8.  DO NOT shower/wash with your normal soap after using and rinsing off  the CHG Soap.                9.  Pat yourself dry with a clean towel.            10.  Wear clean pajamas.            11.  Place clean sheets on your bed the night of your first shower and do not  sleep with pets. Day of Surgery :  Do not apply any lotions/deodorants the morning of surgery.  Please wear clean clothes to the hospital/surgery center.  FAILURE TO FOLLOW THESE INSTRUCTIONS MAY RESULT IN THE CANCELLATION OF YOUR SURGERY PATIENT SIGNATURE_________________________________  NURSE SIGNATURE__________________________________  ________________________________________________________________________

## 2021-06-27 NOTE — Progress Notes (Addendum)
Anesthesia Review:  [id number] johnson,MD  LOV 04/19/21  Cardiologist : DR Shawna Orleans Christopoher-  01/18/21 - LOV  LOV with Stephan Minister , NP - 05/30/21  Pacemaker  Device orders requested on 07/01/21.  Received a note on 07/01/2021 from Device.  On front of chasrt. Device has not been checked in last 12 months.  Scheduler to contact her to have device checked per note.   Neurology- LOV Amy Lomax,NP  Last seizure in either 20 or 21 per pt  Chest x-ray :12/02/20- 2V  CT chest- 02/11/21  EKG :05/30/21  Last Device check- 06/05/21  Echo : 12/03/20  Stress test: Cardiac Cath :  Activity level: can do a flight of stairs without difficulty  Sleep Study/ CPAP : none  Fasting Blood Sugar :      / Checks Blood Sugar -- times a day:   Blood Thinner/ Instructions /Last Dose: ASA / Instructions/ Last Dose :   Coumadin - LOvenoex Bridge-see comments below.  PT states she has instructions at home PT has never given herself injections before but states [facility] instructed her.  Instructted pt at preop to ask for assistance from [place] in regards to this.  PT voiced understanding.   SEe anti-coag note dated 06/30/21.  No covid- ambulatory surgery  Called and LVMM for rebecca at Dr Mayer Camel office and informed that pt lives at [facility]  in [place].  PT lives along.  PT doe shave access to nurses at [facility] in the skilled nursing area.  PT is scheduled as ambiulatory surgery.  PT is concerned about going back to [facility] and being alone.  Does not have anyone there with her or noone to help her.  PT possilbly needs to go to Skilled part of [place] for recovery after surgery.  LVMM to [place] at [place] in regards to this.  Also made aware that [facility] instructed on LOVenox injections.  PT has never given herself injections before.  Instructed pt to notify nurses at [facility] today in regards to she needs supervisions in regatrds to LOVenox to make  sure she is doin correctly.  PT stated she would notify nurses today in regards to this and that she will need help with hibiclens shower nite before and am of surgery ( or sponge baths).  PT does not drive and has transpoirtation proviced by [employer].  Transportation does not start untl 0900am every am.  LVMM for Wells Guiles in regards to transportation does not start until 0900am day of surgery and pt is to arrive at 0845am day of surgery so surgery time may need to be changed.   LVMM for Wells Guiles at [place] office in regards to pt reports at preop that she does not want to be in any pain post surgery.  Aftger kidney surgery pt reports she does not receive anything for pain and remembers that.   BMP done 07/01/21 routed to DR rowan. Marta Lamas, PA made aware of Creatinine results.    PT called in on 07/05/21 wanting to know if nurse had been in touch with Wells Guiles at [place] office.  Informed pt taht voice mail message had been left with all of her concerns.  PT stated she had not heard from anyone.  PT stated she had seen someone from [facility] being transported by a [place] and brought to hospital for surgery and was wondering if transportation is provided by [facility].  Informed pt that  I was not aware of this service but I would make phone calls to see what info could be obtained.  Asked pt to call DR Mayer Camel office and see if surgery time could be change.  PT voiced understanding.  Nurse call Care Management at [facility] and [facility] in regards to transpoirtation bein provided by hospital and left call back number of [phone].  [place] with nurse in [place] and was informed to call [place] [phone],  [facility] was called and they informed me that transportation service had been offiered but did not still know if it was still going on due to financial reasons.  Emailed brochure which I rpinted off and call phone number of [phone].  And LVMM  which issue of pt needing transportaion.  Left call back number of [phone]. Called and LVMM for Rebecca at [facility] in regards to above.  PT was called on2/14/23 and made aware of waht the nurse found out and that call had been placed but unsure if whether or not nurse would get a call back from [employer].  Informed pt that was unsure if service was still going on but would be in touch with her as soon as nurrse heard anything.

## 2021-06-29 ENCOUNTER — Other Ambulatory Visit: Payer: Self-pay

## 2021-06-29 MED ORDER — ENOXAPARIN SODIUM 80 MG/0.8ML IJ SOSY: 80.0000 mg | PREFILLED_SYRINGE | Freq: Two times a day (BID) | INTRAMUSCULAR | 1 refills | Status: DC

## 2021-06-30 ENCOUNTER — Other Ambulatory Visit: Payer: Self-pay

## 2021-06-30 ENCOUNTER — Ambulatory Visit (INDEPENDENT_AMBULATORY_CARE_PROVIDER_SITE_OTHER): Payer: Medicare Other

## 2021-06-30 DIAGNOSIS — Z7901 Long term (current) use of anticoagulants: Secondary | ICD-10-CM

## 2021-06-30 DIAGNOSIS — I482 Chronic atrial fibrillation, unspecified: Secondary | ICD-10-CM

## 2021-06-30 DIAGNOSIS — Z5181 Encounter for therapeutic drug level monitoring: Secondary | ICD-10-CM | POA: Diagnosis not present

## 2021-06-30 LAB — POCT INR: INR: 4.1 — AB (ref 2.0–3.0)

## 2021-06-30 NOTE — Patient Instructions (Signed)
HOLD TONIGHT ONLY and then continue taking  warfarin 1 tablet daily, except 1.5 tablets on Monday and Friday. Recheck INR in 2 weeks. Coumadin Clinic [phone]. Hip Surgery 2/20;  Lovenox Bridged;  2/14: Last dose of warfarin.  2/15: No warfarin or enoxaparin (Lovenox).  2/16: Inject enoxaparin 80 mg in the fatty abdominal tissue at least 2 inches from the belly button twice a day about 12 hours apart, 8am and 8pm rotate sites. No warfarin.  2/17: Inject enoxaparin in the fatty tissue every 12 hours, 8am and 8pm. No warfarin.  2/18: Inject enoxaparin in the fatty tissue every 12 hours, 8am and 8pm. No warfarin.  2/19: Inject enoxaparin in the fatty tissue in the morning at 8 am (No PM dose). No warfarin.  2/20: Procedure Day - No enoxaparin - Resume warfarin in the evening or as directed by doctor (take an extra half tablet with usual dose for 2 days then resume normal dose).  2/21: Resume enoxaparin inject in the fatty tissue every 12 hours and take warfarin  2/22: Inject enoxaparin in the fatty tissue every 12 hours and take warfarin  2/23: Inject enoxaparin in the fatty tissue every 12 hours and take warfarin  2/24: Inject enoxaparin in the fatty tissue every 12 hours and take warfarin  2/25: Inject enoxaparin in the fatty tissue every 12 hours and take warfarin  2/26:Inject enoxaparin in the fatty tissue every 12 hours and take warfarin  2/27:Inject enoxaparin in the fatty tissue every 12 hours and take warfarin  2/28: INR check

## 2021-07-01 ENCOUNTER — Encounter (HOSPITAL_COMMUNITY): Payer: Self-pay

## 2021-07-01 ENCOUNTER — Encounter (HOSPITAL_COMMUNITY)
Admission: RE | Admit: 2021-07-01 | Discharge: 2021-07-01 | Disposition: A | Discharge status: Home / Self Care | Payer: Medicare Other | Source: Ambulatory Visit | Attending: Orthopedic Surgery | Admitting: Orthopedic Surgery

## 2021-07-01 ENCOUNTER — Other Ambulatory Visit: Payer: Self-pay

## 2021-07-01 VITALS — BP 115/84 | HR 70 | Temp 98.0°F | Resp 16 | Ht 68.0 in | Wt 186.4 lb

## 2021-07-01 DIAGNOSIS — Z01818 Encounter for other preprocedural examination: Secondary | ICD-10-CM

## 2021-07-01 DIAGNOSIS — Z01812 Encounter for preprocedural laboratory examination: Secondary | ICD-10-CM | POA: Insufficient documentation

## 2021-07-01 HISTORY — DX: Malignant (primary) neoplasm, unspecified: C80.1

## 2021-07-01 HISTORY — DX: Hypothyroidism, unspecified: E03.9

## 2021-07-01 HISTORY — DX: Unspecified convulsions: R56.9

## 2021-07-01 HISTORY — DX: Unspecified osteoarthritis, unspecified site: M19.90

## 2021-07-01 HISTORY — DX: Presence of cardiac pacemaker: Z95.0

## 2021-07-01 HISTORY — DX: Thyrotoxicosis with diffuse goiter without thyrotoxic crisis or storm: E05.00

## 2021-07-01 LAB — CBC
HCT: 36.4 % (ref 36.0–46.0)
Hemoglobin: 11.8 g/dL — ABNORMAL LOW (ref 12.0–15.0)
MCH: 24.8 pg — ABNORMAL LOW (ref 26.0–34.0)
MCHC: 32.4 g/dL (ref 30.0–36.0)
MCV: 76.5 fL — ABNORMAL LOW (ref 80.0–100.0)
Platelets: 257 10*3/uL (ref 150–400)
RBC: 4.76 MIL/uL (ref 3.87–5.11)
RDW: 14.6 % (ref 11.5–15.5)
WBC: 6.5 10*3/uL (ref 4.0–10.5)
nRBC: 0 % (ref 0.0–0.2)

## 2021-07-01 LAB — BASIC METABOLIC PANEL
Anion gap: 9 (ref 5–15)
BUN: 48 mg/dL — ABNORMAL HIGH (ref 8–23)
CO2: 24 mmol/L (ref 22–32)
Calcium: 9.4 mg/dL (ref 8.9–10.3)
Chloride: 104 mmol/L (ref 98–111)
Creatinine, Ser: 2.93 mg/dL — ABNORMAL HIGH (ref 0.44–1.00)
GFR, Estimated: 16 mL/min — ABNORMAL LOW (ref >60)
Glucose, Bld: 75 mg/dL (ref 70–99)
Potassium: 4 mmol/L (ref 3.5–5.1)
Sodium: 137 mmol/L (ref 135–145)

## 2021-07-01 LAB — SURGICAL PCR SCREEN
MRSA, PCR: NEGATIVE
Staphylococcus aureus: NEGATIVE

## 2021-07-04 NOTE — Progress Notes (Signed)
Anesthesia Chart Review   Case: [id number] Date/Time: 07/11/21 1030   Procedure: RIGHT TOTAL HIP ARTHROPLASTY ANTERIOR APPROACH (Right: Hip)   Anesthesia type: Spinal   Pre-op diagnosis: RIGHT HIP OSTEOARTHRITS   Location: [place] [facility]   Surgeons: Frederik Pear, MD       DISCUSSION:75 y.o. 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 Frederik Pear.   Pt seen by cardiology 05/30/2021. Per OV note, "May hold warfarin 5 days prior to planned procedure.  Will require bridging with Lovenox per review of pharmacist and Dr. Harrell Gave.  Next Coumadin clinic appointment 06/09/2021. According to the Revised Cardiac Risk Index (RCRI), her Perioperative Risk of Major Cardiac Event is (%): 11 Her Functional Capacity in METs is: 5.07 according to the Duke Activity Status Index (DASI).  She may proceed with surgery without further cardiovascular evaluation.  Will send note to Dr. Mayer Camel with message that Lovenox bridging will be addressed on 06/09/21."  Device orders pending.   Anticipate pt can proceed with planned procedure barring acute status change.   VS: BP 115/84    Pulse 70    Temp 36.7 C (Oral)    Resp 16    Ht 5\' 8"  (1.727 m)    Wt 84.5 kg    SpO2 100%    BMI 28.34 kg/m   PROVIDERS: Ladell Pier, MD is PCP   Cardiologist:  Buford Dresser, MD  LABS:  creatinine appears stable (all labs ordered are listed, but only abnormal results are displayed)  Labs Reviewed  BASIC METABOLIC PANEL - Abnormal; Notable for the following components:      Result Value   BUN 48 (*)    Creatinine, Ser 2.93 (*)    GFR, Estimated 16 (*)    All other components within normal limits  CBC - Abnormal; Notable for  the following components:   Hemoglobin 11.8 (*)    MCV 76.5 (*)    MCH 24.8 (*)    All other components within normal limits  SURGICAL PCR SCREEN  TYPE AND SCREEN     IMAGES:   EKG: 05/30/2021 Rate 70 bpm  Ventricular-paced rhythm  Biventricular pacemaker detected  CV: Echo 12/03/2020 1. Left ventricular ejection fraction, by estimation, is 60 to 65%. The  left ventricle has normal function. The left ventricle has no regional  wall motion abnormalities. Left ventricular diastolic function could not  be evaluated.   2. Right ventricular systolic function is normal. The right ventricular  size is not well visualized. There is normal pulmonary artery systolic  pressure. The estimated right ventricular systolic pressure is 81.4 mmHg.   3. Left atrial size was moderately dilated.   4. Right atrial size was mildly dilated.   5. The mitral valve has been repaired/replaced. No evidence of mitral  valve regurgitation. The mean mitral valve gradient is 3.0 mmHg. There is  a present in the mitral position.   6. The aortic valve is tricuspid. Aortic valve regurgitation is trivial.  No aortic stenosis is present.   7. Aortic dilatation noted. There is mild dilatation of the ascending  aorta, measuring 42 mm.   8. The inferior vena cava is normal in size with greater than 50%  respiratory variability, suggesting right atrial pressure of  3 mmHg.  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   Cardiomyopathy [facility])    CHF (congestive heart failure) (HCC)    Chronic a-fib ([place]) 08/14/2016   Chronic anticoagulation 08/14/2016   Coronary artery disease    4v CABG 6/17   Graves disease    Hypertension    Hypothyroidism    MVR 61mm Edwards Perimount Plus Bioprosthetic 11/17/2015   SN [id number] Model [id number] [From patient's surgical info card]   Presence of permanent  cardiac pacemaker    Seizures (HCC)    Thrombus 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 myocardial infarction    Thyroid disease     Past Surgical History:  Procedure Laterality Date   ABDOMINAL HYSTERECTOMY     CORONARY ARTERY BYPASS GRAFT     CRANIOTOMY Right 07/30/2019   Right frontal   NEPHRECTOMY Right 10/16/2019    MEDICATIONS:  enoxaparin (LOVENOX) 80 MG/0.8ML injection   acetaminophen (TYLENOL) 500 MG tablet   colchicine 0.6 MG tablet   hydrALAZINE (APRESOLINE) 10 MG tablet   levETIRAcetam (KEPPRA) 500 MG tablet   levothyroxine (SYNTHROID) 25 MCG tablet   metoprolol succinate (TOPROL-XL) 25 MG 24 hr tablet   nitroGLYCERIN (NITROSTAT) 0.4 MG SL tablet   OVER THE COUNTER MEDICATION   polyethylene glycol (MIRALAX / GLYCOLAX) 17 g packet   pravastatin (PRAVACHOL) 20 MG tablet   spironolactone (ALDACTONE) 25 MG tablet   torsemide (DEMADEX) 20 MG tablet   traMADol (ULTRAM) 50 MG tablet   warfarin (COUMADIN) 5 MG tablet   No current facility-administered medications for this encounter.     Konrad Felix Ward, PA-C [facility] Pre-Surgical Testing [phone]

## 2021-07-05 ENCOUNTER — Telehealth: Payer: Self-pay | Admitting: Internal Medicine

## 2021-07-05 NOTE — Progress Notes (Signed)
Jacqueline Palmer from office of Dr Mayer Camel caled back and informed her of all of above and that Transportation Service had called nurse and info on pt provided to transportation service of [place] and informed Jacqueline Palmer that nurse would keep her informed in regasrds to issiues on pt.

## 2021-07-05 NOTE — Progress Notes (Signed)
Transportation service called back , Artist with Transportation service of [employer] and asked for pt medical record number, day of surgery and time and arrival time and pt phone number and stated he would give pt a call in regards to transportation.

## 2021-07-05 NOTE — Telephone Encounter (Signed)
°   DELORUS LANGWELL DOB: [birth year] MRN: [id number]   RIDER WAIVER AND RELEASE OF LIABILITY  For purposes of improving physical access to our facilities, [facility] is pleased to partner with third parties to provide [facility] patients or other authorized individuals the option of convenient, on-demand ground transportation services (the [employer]) through use of the technology service that enables users to request on-demand ground transportation from independent third-party providers.  By opting to use and accept these [employer], I, the undersigned, hereby agree on behalf of myself, and on behalf of any minor child using the Government social research officer for whom I am the parent or legal guardian, as follows:  Government social research officer provided to me are provided by independent third-party transportation providers who are not [employer] or employees and who are unaffiliated with [employer]. [facility] is neither a transportation carrier nor a common or public carrier. [place] has no control over the quality or safety of the transportation that occurs as a result of the [employer]. [facility] cannot guarantee that any third-party transportation provider will complete any arranged transportation service. [place] [facility] makes no representation, warranty, or guarantee regarding the reliability, timeliness, quality, safety, suitability, or availability of any of the Transport Services or that they will be error free. I fully understand that traveling by vehicle involves risks and dangers of serious bodily injury, including permanent disability, paralysis, and death. I agree, on behalf of myself and on behalf of any minor child using the Transport Services for whom I am the parent or legal guardian, that the entire risk arising out of my use of the [employer] remains solely with me, to the maximum extent permitted under applicable law. The [employer] are provided as  is and as available. [facility] disclaims all representations and warranties, express, implied or statutory, not expressly set out in these terms, including the implied warranties of merchantability and fitness for a particular purpose. I hereby waive and release [place], its agents, employees, officers, directors, representatives, insurers, attorneys, assigns, successors, subsidiaries, and affiliates from any and all past, present, or future claims, demands, liabilities, actions, causes of action, or suits of any kind directly or indirectly arising from acceptance and use of the [employer]. I further waive and release [place] and its affiliates from all present and future liability and responsibility for any injury or death to persons or damages to property caused by or related to the use of the [employer]. I have read this Waiver and Release of Liability, and I understand the terms used in it and their legal significance. This Waiver is freely and voluntarily given with the understanding that my right (as well as the right of any minor child for whom I am the parent or legal guardian using the [employer]) to legal recourse against [place] in connection with the [employer] is knowingly surrendered in return for use of these services.   I attest that I read the consent document to [name], gave Ms. Campanella the opportunity to ask questions and answered the questions asked (if any). I affirm that [name] then provided consent for she's participation in this program.     Darrick Meigs Vilsaint

## 2021-07-05 NOTE — Progress Notes (Signed)
CBC for preop testing

## 2021-07-06 NOTE — Progress Notes (Signed)
Called pt and pt informed nurse that transportation had been arranged for her to come in for surgery on 07/11/2021.  PT stated that Transportation services would be calling her on 07/08/2021.  To finalize pick up time for surgery and who would be taking pt to hospital.  PT still does not know if she is spending the nite ) overnite) or not.  PT has not heard back from Dr Mayer Camel office.  PT states she has arranged at [facility] for someone to be there with her for 2 days once she arrives home.  Someone is also coming to help her with hibiclens shower before surgery.

## 2021-07-06 NOTE — Progress Notes (Addendum)
Lyndon Code, [facility] for Anesthesia asked me to call [place] at [phone] and ask them to come on DOS for pt and interrogate device and be able for reprogramming if needed.  [employer] and they are to page device rep and ask them to call me.  Spoke with Cordella Register on 07/06/21 at 400pm.  Per Jacqueline last remote interrogation was 06/30/21 and LOV 05/18/21.  Jacqueline was given type of surgery and date and time of surgery.  Jacqueline Palmer stated device did not need interrogating nor reprogramming DOS.  He stated for you to reach out to him at [phone] and have him pageg and he would be happy to talk to you.

## 2021-07-06 NOTE — Progress Notes (Addendum)
LVMM for Jacqueline Palmer at [place] office and informed her that transportation has been arrange for pt , hibiclens shower assistance has been arrange by pt prior to surgery and she as someone to stay with her for first 2 nites she is home post surgery.  PT states she is still awaiting to her fromDR Mayer Camel office in regards to staying overnite after surgery.  PT aware as of 07/06/21 she is to arrive at 0815am at [facility] for surgery on 07/11/21.

## 2021-07-06 NOTE — H&P (Signed)
TOTAL HIP ADMISSION H&P  Patient is admitted for right total hip arthroplasty.  Subjective:  Chief Complaint: right hip pain  HPI: Jacqueline Palmer, 76 y.o. female, has a history of pain and functional disability in the right hip(s) due to arthritis and patient has failed non-surgical conservative treatments for greater than 12 weeks to include use of assistive devices and activity modification.  Onset of symptoms was gradual starting  several  years ago with gradually worsening course since that time.The patient noted no past surgery on the right hip(s).  Patient currently rates pain in the right hip at 10 out of 10 with activity. Patient has night pain, worsening of pain with activity and weight bearing, trendelenberg gait, pain that interfers with activities of daily living, and pain with passive range of motion. Patient has evidence of periarticular osteophytes and joint space narrowing by imaging studies. This condition presents safety issues increasing the risk of falls.  There is no current active infection.  Patient Active Problem List   Diagnosis Date Noted   Advance directive in chart 04/24/2021   Ascending aortic aneurysm 04/24/2021   Gait disturbance 04/19/2021   Acute-on-chronic kidney injury ([place]) 12/03/2020   Subtherapeutic international normalized ratio (INR) 12/03/2020   Chest pain 12/02/2020   Neuropathic pain 11/25/2020   Seizures ([place]) 06/16/2020   Over weight 05/03/2020   Nonischemic cardiomyopathy ([place]) 03/04/2020   Biventricular automatic implantable cardioverter defibrillator in situ 02/19/2020   Chronic diastolic heart failure ([place]) 02/19/2020   Hx of CABG 02/19/2020   Coronary artery disease involving native coronary artery of native heart without angina pectoris 02/19/2020   Essential hypertension 02/19/2020   Permanent atrial fibrillation ([place]) 02/19/2020   CKD (chronic kidney disease) stage 4, GFR 15-29 ml/min ([place]) 02/19/2020   Renal cell carcinoma of right  kidney ([place]) 02/12/2020   Brain metastasis ([place]) 02/12/2020   Gout attack 08/14/2016   Chronic a-fib ([place]) 08/14/2016   Chronic anticoagulation 08/14/2016   Acute on chronic systolic heart failure ([place]) 08/09/2016   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   Past Medical History:  Diagnosis Date   Acute kidney injury ([place])    Arthritis    Atrial fibrillation ([place])    Cancer ([place])    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   Cardiomyopathy ([place])    CHF (congestive heart failure) ([place])    Chronic a-fib ([place]) 08/14/2016   Chronic anticoagulation 08/14/2016   Coronary artery disease    4v CABG 6/17   Graves disease    Hypertension    Hypothyroidism    MVR 61mm Edwards Perimount Plus Bioprosthetic 11/17/2015   SN [id number] Model [id number] [From patient's surgical info card]   Presence of permanent cardiac pacemaker    Seizures ([place])    Thrombus 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 myocardial infarction    Thyroid disease     Past Surgical History:  Procedure Laterality Date   ABDOMINAL HYSTERECTOMY     CORONARY ARTERY BYPASS GRAFT     CRANIOTOMY Right 07/30/2019   Right frontal   NEPHRECTOMY Right 10/16/2019    No current facility-administered medications for this encounter.   Current Outpatient Medications  Medication Sig Dispense Refill Last Dose   acetaminophen (TYLENOL) 500 MG tablet Take 1,000 mg by mouth in the morning and at bedtime.      colchicine 0.6 MG tablet Take 1 tablet (0.6 mg total) by mouth at bedtime. 90 tablet 3    enoxaparin (LOVENOX) 80 MG/0.8ML injection Inject 0.8 mLs (  80 mg total) into the skin every 12 (twelve) hours. 16 mL 1    hydrALAZINE (APRESOLINE) 10 MG tablet TAKE ONE TABLET BY MOUTH THREE TIMES DAILY 270 tablet 2    levETIRAcetam (KEPPRA) 500 MG tablet Take 1 tablet (500 mg total) by mouth 2 (two) times daily. 180 tablet 3    levothyroxine (SYNTHROID) 25 MCG tablet TAKE ONE TABLET BY MOUTH ONE TIME DAILY 30 to 60 minutes before breakfast on an empty  stomach with a full glass of water 90 tablet 2    metoprolol succinate (TOPROL-XL) 25 MG 24 hr tablet Take 1 tablet (25 mg total) by mouth daily. 90 tablet 3    nitroGLYCERIN (NITROSTAT) 0.4 MG SL tablet Place 1 tablet (0.4 mg total) under the tongue every 5 (five) minutes as needed for chest pain. 25 tablet 3    OVER THE COUNTER MEDICATION Apply 1 application topically 2 (two) times daily. Pain cream in groin area      polyethylene glycol (MIRALAX / GLYCOLAX) 17 g packet Take 17 g by mouth daily as needed for mild constipation.      pravastatin (PRAVACHOL) 20 MG tablet Take 20 mg by mouth at bedtime.      spironolactone (ALDACTONE) 25 MG tablet Take 1 tablet (25 mg total) by mouth daily. 90 tablet 3    torsemide (DEMADEX) 20 MG tablet Take 20 mg by mouth 2 (two) times daily.      traMADol (ULTRAM) 50 MG tablet Take 50 mg by mouth 2 (two) times daily.      warfarin (COUMADIN) 5 MG tablet Take 1 tablet (5 mg total) by mouth every evening. (Patient taking differently: Take 5-7.5 mg by mouth every evening. Take 7.5 mg on Monday and Friday All the other days take 5 mg) 90 tablet 1    Allergies  Allergen Reactions   Avocado Shortness Of Breath    Before 2007   Banana Anaphylaxis and Swelling   Plantain Anaphylaxis and Swelling   Ace Inhibitors Cough   Codeine Other (See Comments)    Nausea and Feels Jittery   Prednisone Swelling    Of face and lower extremities   Tape Hives    Tape  Does better with paper tape    Latex Rash   Pharmabase Cosmetic [[place]] Swelling and Rash    Some Soap, perfumes,detergent    Social History   Tobacco Use   Smoking status: Former    Types: Cigarettes    Quit date: 07/30/2014    Years since quitting: 6.9   Smokeless tobacco: Never  Substance Use Topics   Alcohol use: Yes    Comment: 1 glass wine twice weekly    Family History  Problem Relation Age of Onset   Heart attack Mother    Heart attack Father    Heart attack Sister    Breast cancer Neg Hx       Review of Systems  Constitutional: Negative.   HENT: Negative.    Eyes: Negative.   Respiratory: Negative.    Cardiovascular: Negative.   Gastrointestinal:  Positive for abdominal pain, diarrhea and nausea.  Endocrine: Negative.   Genitourinary: Negative.   Musculoskeletal:  Positive for arthralgias and myalgias.  Skin:  Positive for rash.  Allergic/Immunologic: Negative.   Neurological: Negative.   Hematological: Negative.   Psychiatric/Behavioral: Negative.     Objective:  Physical Exam Constitutional:      Appearance: Normal appearance. She is normal weight.  HENT:     Head: Normocephalic  and atraumatic.     Nose: Nose normal.  Eyes:     Pupils: Pupils are equal, round, and reactive to light.  Pulmonary:     Effort: Pulmonary effort is normal.  Musculoskeletal:        General: Tenderness present.     Cervical back: Normal range of motion and neck supple.     Comments: the patient has good strength good range of motion in the left hip.  Patient's right hip does have pain with hip flexion extension internal and external rotation.  She is only able to internally rotate to approximately 10 on the right.  He will bump does cause increased pain.  Patient also has pain with palpation around the right knee more so over the medial joint line and parapatellar region.  She does have crepitus with range of motion the knee.  Range from 5 to 115.  No instability.  Calves are soft and nontender.  Skin:    General: Skin is warm and dry.  Neurological:     General: No focal deficit present.     Mental Status: She is alert and oriented to person, place, and time. Mental status is at baseline.  Psychiatric:        Mood and Affect: Mood normal.        Behavior: Behavior normal.        Thought Content: Thought content normal.        Judgment: Judgment normal.    Vital signs in last 24 hours:    Labs:   Estimated body mass index is 28.34 kg/m as calculated from the  following:   Height as of 07/01/21: 5\' 8"  (1.727 m).   Weight as of 07/01/21: 84.5 kg.   Imaging Review Plain radiographs demonstrate  bilateral AP weightbearing, bilateral Rosenberg, lateral sunrise views of the right knee are taken and reviewed in office today.  This shows moderate approaching severe arthritis bilateral knees with periarticular osteophyte formation.  She also has moderate to severe patellofemoral arthritis.  Assessment/Plan:  End stage arthritis, right hip(s)  The patient history, physical examination, clinical judgement of the provider and imaging studies are consistent with end stage degenerative joint disease of the right hip(s) and total hip arthroplasty is deemed medically necessary. The treatment options including medical management, injection therapy, arthroscopy and arthroplasty were discussed at length. The risks and benefits of total hip arthroplasty were presented and reviewed. The risks due to aseptic loosening, infection, stiffness, dislocation/subluxation,  thromboembolic complications and other imponderables were discussed.  The patient acknowledged the explanation, agreed to proceed with the plan and consent was signed. Patient is being admitted for inpatient treatment for surgery, pain control, PT, OT, prophylactic antibiotics, VTE prophylaxis, progressive ambulation and ADL's and discharge planning.The patient is planning to be discharged home with home health services    Patient's anticipated LOS is less than 2 midnights, meeting these requirements: - Younger than 5 - Lives within 1 hour of care - Has a competent adult at home to recover with post-op recover - NO history of  - Chronic pain requiring opiods  - Diabetes  - Coronary Artery Disease  - Heart failure  - Heart attack  - Stroke  - DVT/VTE  - Cardiac arrhythmia  - Respiratory Failure/COPD  - Renal failure  - Anemia  - Advanced Liver disease

## 2021-07-08 NOTE — Care Plan (Signed)
Spoke with patient prior to surgery. She plans to return to [place] and has caregivers arranged to assist her. OPPT has been set up onsite with [facility].  She doesn't need HHPT at this time and doesn't want to go to SNF for rehab   Ladell Heads, [place]

## 2021-07-11 ENCOUNTER — Ambulatory Visit (HOSPITAL_BASED_OUTPATIENT_CLINIC_OR_DEPARTMENT_OTHER): Payer: Medicare Other | Admitting: Certified Registered"

## 2021-07-11 ENCOUNTER — Encounter (HOSPITAL_COMMUNITY)
Admission: RE | Disposition: A | Discharge status: Home / Self Care | Payer: Self-pay | Source: Ambulatory Visit | Attending: Orthopedic Surgery

## 2021-07-11 ENCOUNTER — Ambulatory Visit (HOSPITAL_COMMUNITY): Payer: Medicare Other

## 2021-07-11 ENCOUNTER — Ambulatory Visit (HOSPITAL_COMMUNITY): Payer: Medicare Other | Admitting: Physician Assistant

## 2021-07-11 ENCOUNTER — Other Ambulatory Visit: Payer: Self-pay

## 2021-07-11 ENCOUNTER — Inpatient Hospital Stay (HOSPITAL_COMMUNITY)
Admission: RE | Admit: 2021-07-11 | Discharge: 2021-07-14 | DRG: 470 | Disposition: A | Discharge status: Home / Self Care | Payer: Medicare Other | Source: Ambulatory Visit | Attending: Orthopedic Surgery | Admitting: Orthopedic Surgery

## 2021-07-11 ENCOUNTER — Encounter (HOSPITAL_COMMUNITY): Payer: Self-pay | Admitting: Orthopedic Surgery

## 2021-07-11 DIAGNOSIS — M25751 Osteophyte, right hip: Secondary | ICD-10-CM | POA: Diagnosis present

## 2021-07-11 DIAGNOSIS — Z95810 Presence of automatic (implantable) cardiac defibrillator: Secondary | ICD-10-CM

## 2021-07-11 DIAGNOSIS — M879 Osteonecrosis, unspecified: Principal | ICD-10-CM | POA: Diagnosis present

## 2021-07-11 DIAGNOSIS — M1611 Unilateral primary osteoarthritis, right hip: Secondary | ICD-10-CM

## 2021-07-11 DIAGNOSIS — Z885 Allergy status to narcotic agent status: Secondary | ICD-10-CM

## 2021-07-11 DIAGNOSIS — Z90710 Acquired absence of both cervix and uterus: Secondary | ICD-10-CM

## 2021-07-11 DIAGNOSIS — Z79899 Other long term (current) drug therapy: Secondary | ICD-10-CM

## 2021-07-11 DIAGNOSIS — Z91040 Latex allergy status: Secondary | ICD-10-CM

## 2021-07-11 DIAGNOSIS — I7121 Aneurysm of the ascending aorta, without rupture: Secondary | ICD-10-CM | POA: Diagnosis present

## 2021-07-11 DIAGNOSIS — I130 Hypertensive heart and chronic kidney disease with heart failure and stage 1 through stage 4 chronic kidney disease, or unspecified chronic kidney disease: Secondary | ICD-10-CM | POA: Diagnosis present

## 2021-07-11 DIAGNOSIS — I4821 Permanent atrial fibrillation: Secondary | ICD-10-CM | POA: Diagnosis present

## 2021-07-11 DIAGNOSIS — I509 Heart failure, unspecified: Secondary | ICD-10-CM

## 2021-07-11 DIAGNOSIS — I2510 Atherosclerotic heart disease of native coronary artery without angina pectoris: Secondary | ICD-10-CM

## 2021-07-11 DIAGNOSIS — Z8249 Family history of ischemic heart disease and other diseases of the circulatory system: Secondary | ICD-10-CM

## 2021-07-11 DIAGNOSIS — N184 Chronic kidney disease, stage 4 (severe): Secondary | ICD-10-CM | POA: Diagnosis present

## 2021-07-11 DIAGNOSIS — Z905 Acquired absence of kidney: Secondary | ICD-10-CM

## 2021-07-11 DIAGNOSIS — M87051 Idiopathic aseptic necrosis of right femur: Secondary | ICD-10-CM | POA: Diagnosis present

## 2021-07-11 DIAGNOSIS — I5032 Chronic diastolic (congestive) heart failure: Secondary | ICD-10-CM | POA: Diagnosis present

## 2021-07-11 DIAGNOSIS — Z419 Encounter for procedure for purposes other than remedying health state, unspecified: Secondary | ICD-10-CM

## 2021-07-11 DIAGNOSIS — Z87891 Personal history of nicotine dependence: Secondary | ICD-10-CM

## 2021-07-11 DIAGNOSIS — M109 Gout, unspecified: Secondary | ICD-10-CM | POA: Diagnosis present

## 2021-07-11 DIAGNOSIS — Z6828 Body mass index (BMI) 28.0-28.9, adult: Secondary | ICD-10-CM

## 2021-07-11 DIAGNOSIS — E669 Obesity, unspecified: Secondary | ICD-10-CM | POA: Diagnosis present

## 2021-07-11 DIAGNOSIS — Z888 Allergy status to other drugs, medicaments and biological substances status: Secondary | ICD-10-CM

## 2021-07-11 DIAGNOSIS — I110 Hypertensive heart disease with heart failure: Secondary | ICD-10-CM | POA: Diagnosis not present

## 2021-07-11 DIAGNOSIS — Z79891 Long term (current) use of opiate analgesic: Secondary | ICD-10-CM

## 2021-07-11 DIAGNOSIS — Z85528 Personal history of other malignant neoplasm of kidney: Secondary | ICD-10-CM

## 2021-07-11 DIAGNOSIS — Z79890 Hormone replacement therapy (postmenopausal): Secondary | ICD-10-CM

## 2021-07-11 DIAGNOSIS — M89751 Major osseous defect, right pelvic region and thigh: Secondary | ICD-10-CM | POA: Diagnosis present

## 2021-07-11 DIAGNOSIS — D62 Acute posthemorrhagic anemia: Secondary | ICD-10-CM | POA: Diagnosis not present

## 2021-07-11 DIAGNOSIS — Z952 Presence of prosthetic heart valve: Secondary | ICD-10-CM

## 2021-07-11 DIAGNOSIS — Z7901 Long term (current) use of anticoagulants: Secondary | ICD-10-CM

## 2021-07-11 DIAGNOSIS — Z91018 Allergy to other foods: Secondary | ICD-10-CM

## 2021-07-11 DIAGNOSIS — Z951 Presence of aortocoronary bypass graft: Secondary | ICD-10-CM

## 2021-07-11 DIAGNOSIS — E039 Hypothyroidism, unspecified: Secondary | ICD-10-CM | POA: Diagnosis present

## 2021-07-11 DIAGNOSIS — I428 Other cardiomyopathies: Secondary | ICD-10-CM | POA: Diagnosis present

## 2021-07-11 HISTORY — PX: TOTAL HIP ARTHROPLASTY: SHX124

## 2021-07-11 LAB — PROTIME-INR
INR: 1.2 (ref 0.8–1.2)
Prothrombin Time: 15.6 seconds — ABNORMAL HIGH (ref 11.4–15.2)

## 2021-07-11 LAB — CREATININE, SERUM
Creatinine, Ser: 2.35 mg/dL — ABNORMAL HIGH (ref 0.44–1.00)
GFR, Estimated: 21 mL/min — ABNORMAL LOW (ref >60)

## 2021-07-11 LAB — CBC
HCT: 29.5 % — ABNORMAL LOW (ref 36.0–46.0)
Hemoglobin: 9.3 g/dL — ABNORMAL LOW (ref 12.0–15.0)
MCH: 24.5 pg — ABNORMAL LOW (ref 26.0–34.0)
MCHC: 31.5 g/dL (ref 30.0–36.0)
MCV: 77.8 fL — ABNORMAL LOW (ref 80.0–100.0)
Platelets: 205 10*3/uL (ref 150–400)
RBC: 3.79 MIL/uL — ABNORMAL LOW (ref 3.87–5.11)
RDW: 14.3 % (ref 11.5–15.5)
WBC: 12.4 10*3/uL — ABNORMAL HIGH (ref 4.0–10.5)
nRBC: 0 % (ref 0.0–0.2)

## 2021-07-11 LAB — APTT: aPTT: 38 seconds — ABNORMAL HIGH (ref 24–36)

## 2021-07-11 LAB — ABO/RH: ABO/RH(D): O POS

## 2021-07-11 IMAGING — RF DG HIP (WITH OR WITHOUT PELVIS) 1V*R*
1 series · 3 of 3 positions shown · non-contrast
Comparison: None.

CLINICAL DATA: Fluoroscopic assistance for right hip arthroplasty

EXAM:
DG HIP (WITH OR WITHOUT PELVIS) 1V RIGHT

[Series 1: unknown protocol · 0.20mm/px · 3 of 3 slices shown]
[im 1/3]
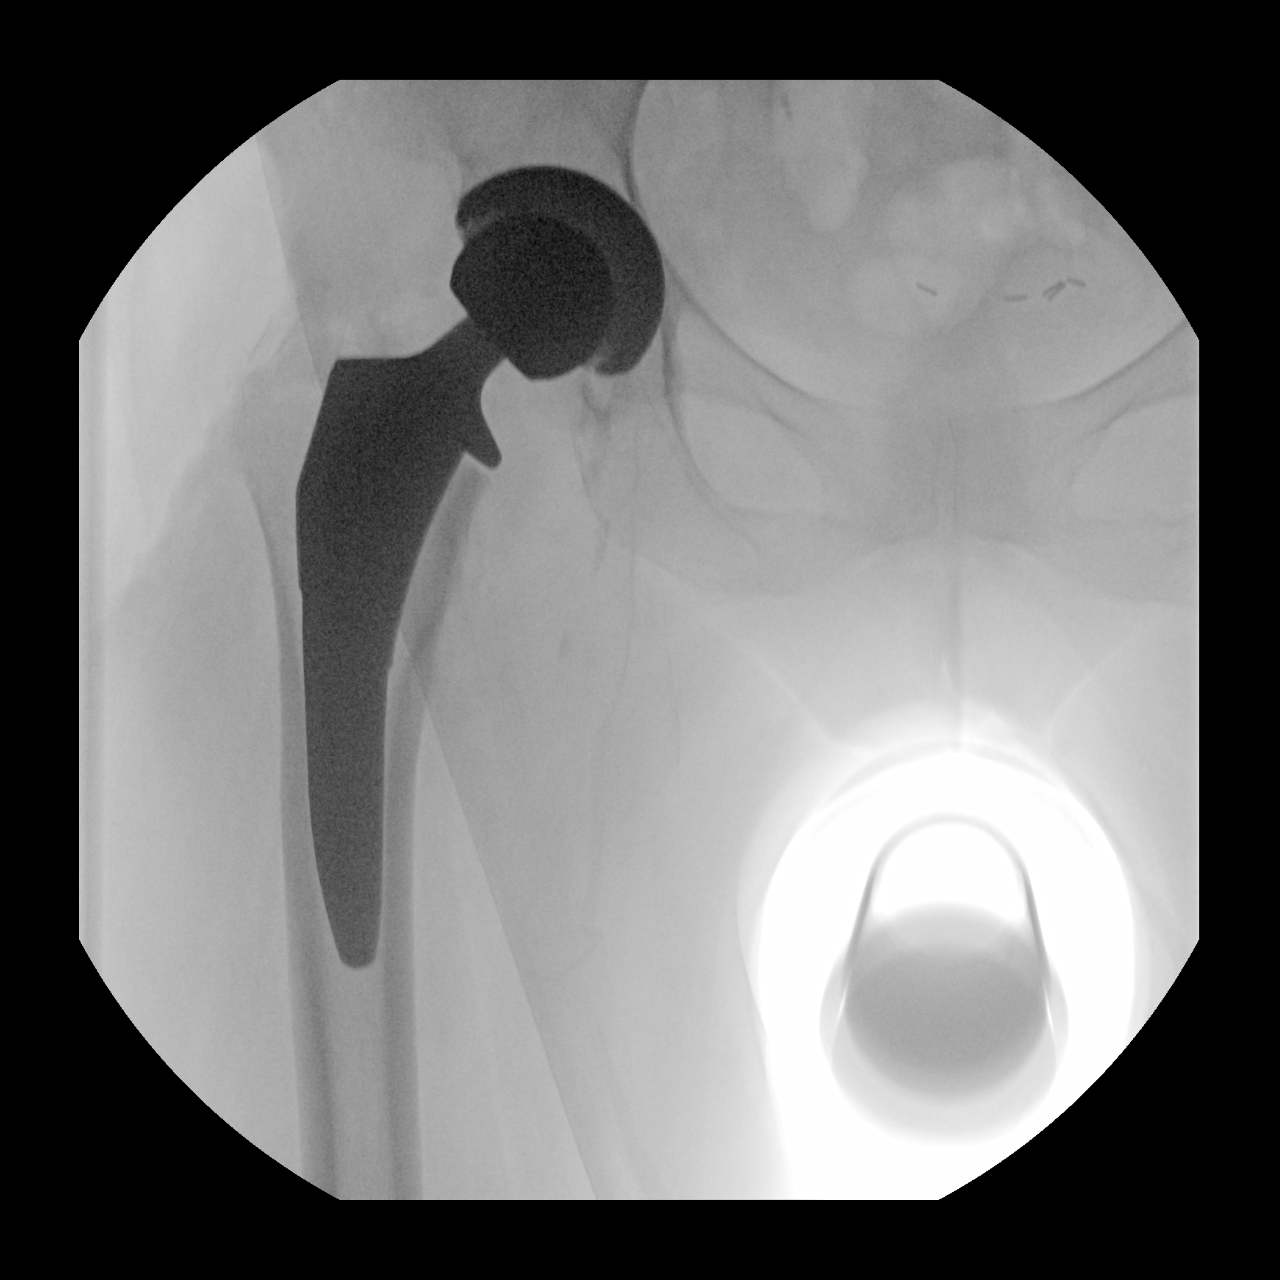
[im 2/3]
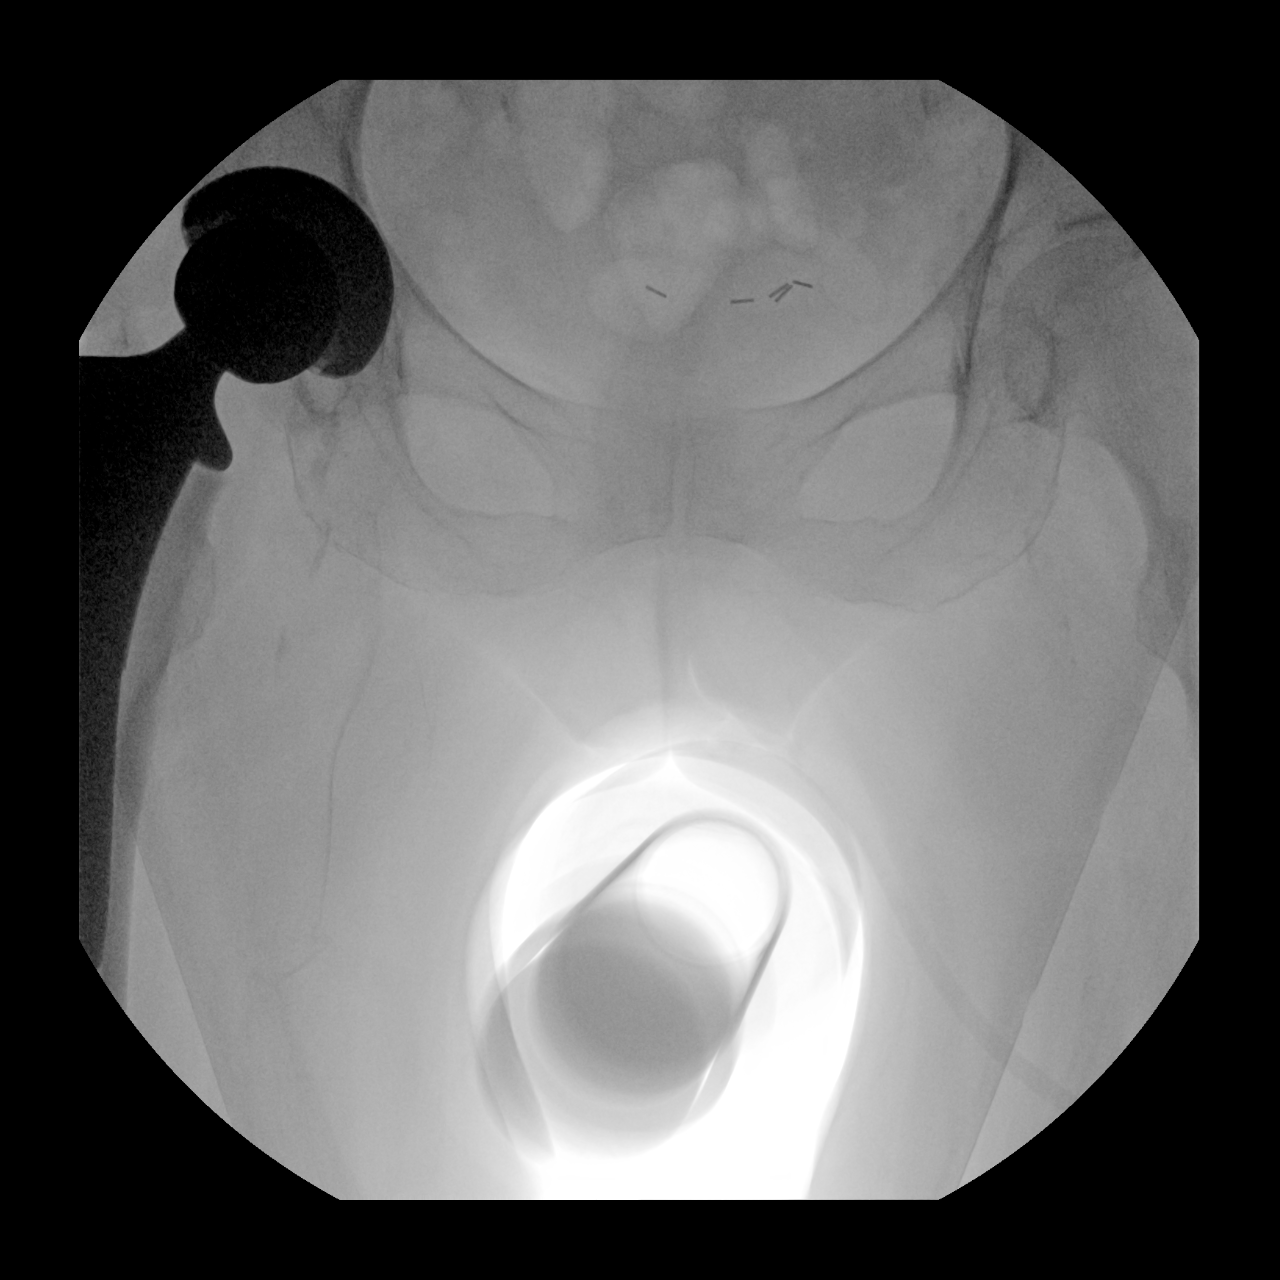
[im 3/3]
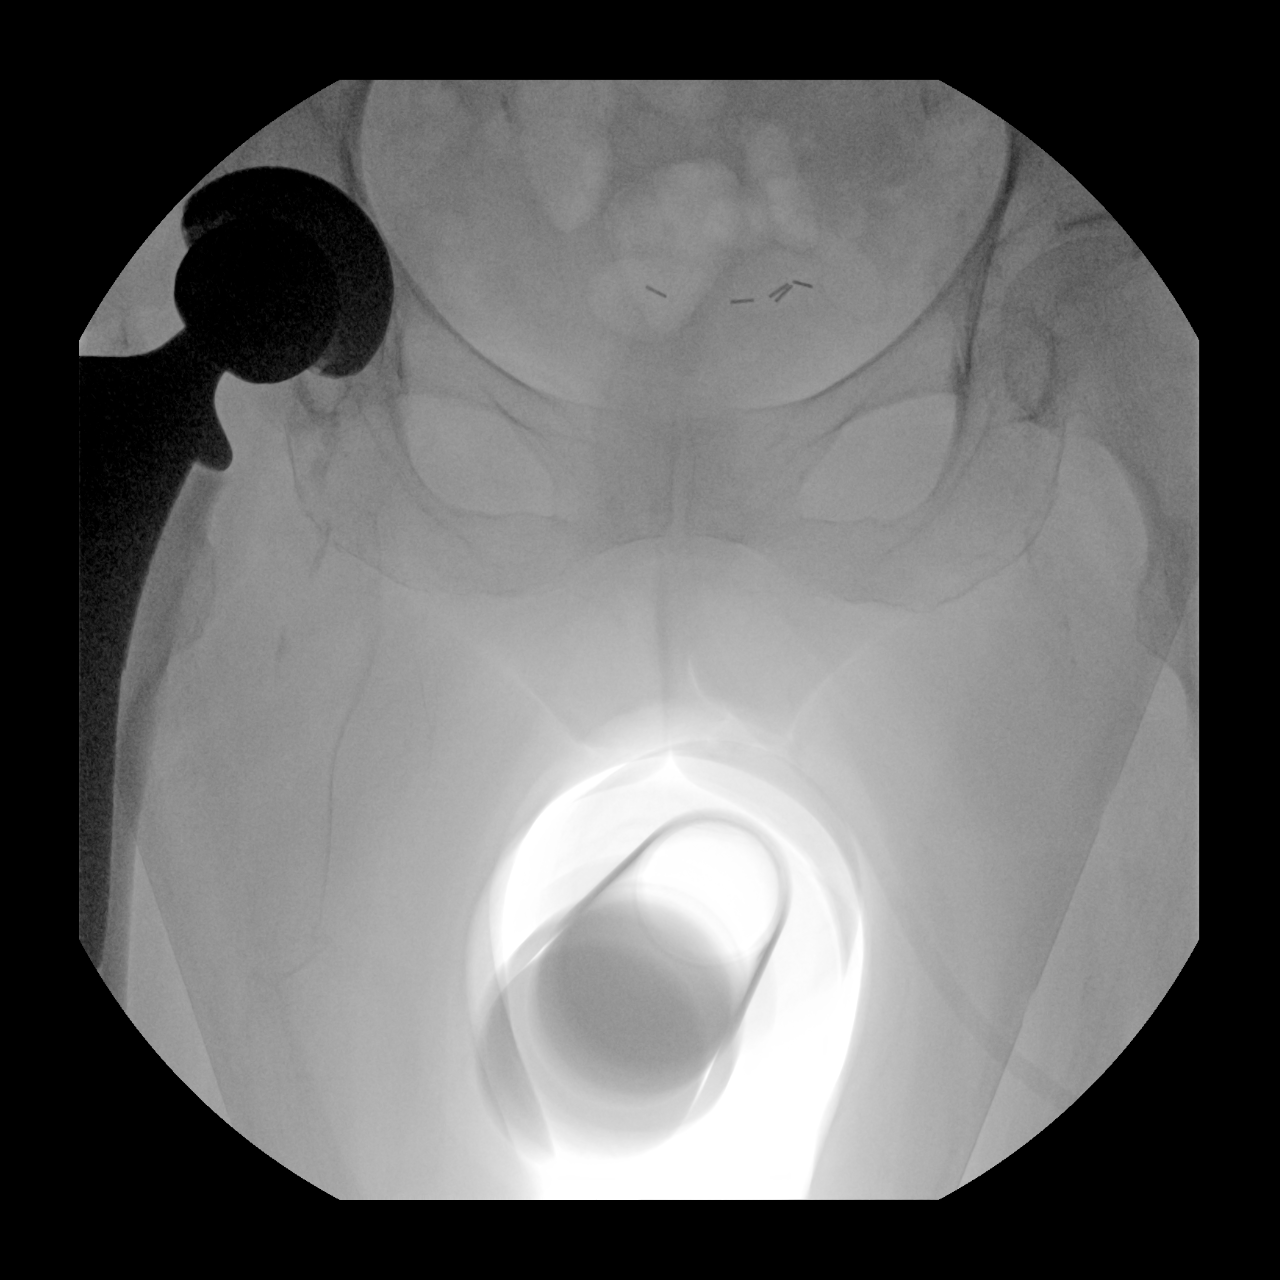

[3 of 3 positions shown; findings below may reference images not displayed]

FINDINGS: Fluoroscopic images show right hip arthroplasty. Surgical clips are
seen in the pelvis. Fluoroscopic time was 15 seconds. Radiation dose
is 1.9 mGy.
IMPRESSION: Fluoroscopic assistance was provided for right hip arthroplasty.

## 2021-07-11 SURGERY — ARTHROPLASTY, HIP, TOTAL, ANTERIOR APPROACH
Anesthesia: General | Site: Hip | Laterality: Right

## 2021-07-11 MED ORDER — HYDROMORPHONE HCL 1 MG/ML IJ SOLN
0.5000 mg | INTRAMUSCULAR | Status: DC | PRN
  Administered 2021-07-11 – 2021-07-13 (×4): 1 mg via INTRAVENOUS
  Filled 2021-07-11 (×4): qty 1

## 2021-07-11 MED ORDER — HYDRALAZINE HCL 20 MG/ML IJ SOLN
5.0000 mg | Freq: Once | INTRAMUSCULAR | Status: AC
  Administered 2021-07-11: 5 mg via INTRAVENOUS

## 2021-07-11 MED ORDER — MENTHOL 3 MG MT LOZG: 1.0000 | LOZENGE | OROMUCOSAL | Status: DC | PRN

## 2021-07-11 MED ORDER — SUGAMMADEX SODIUM 200 MG/2ML IV SOLN
INTRAVENOUS | Status: DC | PRN
  Administered 2021-07-11: 200 mg via INTRAVENOUS

## 2021-07-11 MED ORDER — FENTANYL CITRATE (PF) 100 MCG/2ML IJ SOLN
INTRAMUSCULAR | Status: DC | PRN
  Administered 2021-07-11 (×3): 50 ug via INTRAVENOUS
  Administered 2021-07-11: 100 ug via INTRAVENOUS
  Administered 2021-07-11: 50 ug via INTRAVENOUS
  Administered 2021-07-11: 25 ug via INTRAVENOUS

## 2021-07-11 MED ORDER — LACTATED RINGERS IV SOLN: INTRAVENOUS | Status: DC

## 2021-07-11 MED ORDER — BISACODYL 5 MG PO TBEC: 5.0000 mg | DELAYED_RELEASE_TABLET | Freq: Every day | ORAL | Status: DC | PRN

## 2021-07-11 MED ORDER — METOPROLOL SUCCINATE ER 25 MG PO TB24
25.0000 mg | ORAL_TABLET | Freq: Every day | ORAL | Status: DC
  Administered 2021-07-12 – 2021-07-14 (×3): 25 mg via ORAL
  Filled 2021-07-11 (×3): qty 1

## 2021-07-11 MED ORDER — SODIUM CHLORIDE 0.9% FLUSH
INTRAVENOUS | Status: DC | PRN
  Administered 2021-07-11: 30 mL

## 2021-07-11 MED ORDER — TRANEXAMIC ACID-NACL 1000-0.7 MG/100ML-% IV SOLN
1000.0000 mg | Freq: Once | INTRAVENOUS | Status: AC
  Administered 2021-07-11: 1000 mg via INTRAVENOUS
  Filled 2021-07-11: qty 100

## 2021-07-11 MED ORDER — SPIRONOLACTONE 25 MG PO TABS
25.0000 mg | ORAL_TABLET | Freq: Every day | ORAL | Status: DC
  Administered 2021-07-12: 25 mg via ORAL
  Filled 2021-07-11: qty 1

## 2021-07-11 MED ORDER — PHENOL 1.4 % MT LIQD: 1.0000 | OROMUCOSAL | Status: DC | PRN

## 2021-07-11 MED ORDER — DIPHENHYDRAMINE HCL 12.5 MG/5ML PO ELIX: 12.5000 mg | ORAL_SOLUTION | ORAL | Status: DC | PRN

## 2021-07-11 MED ORDER — ROCURONIUM BROMIDE 10 MG/ML (PF) SYRINGE
PREFILLED_SYRINGE | INTRAVENOUS | Status: AC
  Filled 2021-07-11: qty 10

## 2021-07-11 MED ORDER — PHENYLEPHRINE HCL (PRESSORS) 10 MG/ML IV SOLN
INTRAVENOUS | Status: AC
  Filled 2021-07-11: qty 1

## 2021-07-11 MED ORDER — PROPOFOL 10 MG/ML IV BOLUS
INTRAVENOUS | Status: AC
  Filled 2021-07-11: qty 20

## 2021-07-11 MED ORDER — ACETAMINOPHEN 325 MG PO TABS: 325.0000 mg | ORAL_TABLET | Freq: Four times a day (QID) | ORAL | Status: DC | PRN

## 2021-07-11 MED ORDER — ENOXAPARIN SODIUM 30 MG/0.3ML IJ SOSY
30.0000 mg | PREFILLED_SYRINGE | INTRAMUSCULAR | Status: DC
  Administered 2021-07-12 – 2021-07-14 (×3): 30 mg via SUBCUTANEOUS
  Filled 2021-07-11 (×3): qty 0.3

## 2021-07-11 MED ORDER — BUPIVACAINE-EPINEPHRINE (PF) 0.25% -1:200000 IJ SOLN
INTRAMUSCULAR | Status: AC
  Filled 2021-07-11: qty 30

## 2021-07-11 MED ORDER — BUPIVACAINE-EPINEPHRINE 0.25% -1:200000 IJ SOLN
INTRAMUSCULAR | Status: DC | PRN
  Administered 2021-07-11: 30 mL

## 2021-07-11 MED ORDER — WARFARIN SODIUM 5 MG PO TABS
5.0000 mg | ORAL_TABLET | ORAL | Status: DC
  Administered 2021-07-12 – 2021-07-13 (×2): 5 mg via ORAL
  Filled 2021-07-11 (×2): qty 1

## 2021-07-11 MED ORDER — POVIDONE-IODINE 10 % EX SWAB
2.0000 "application " | Freq: Once | CUTANEOUS | Status: AC
  Administered 2021-07-11: 2 via TOPICAL

## 2021-07-11 MED ORDER — MIDAZOLAM HCL 2 MG/2ML IJ SOLN
INTRAMUSCULAR | Status: AC
  Filled 2021-07-11: qty 2

## 2021-07-11 MED ORDER — ALUM & MAG HYDROXIDE-SIMETH 200-200-20 MG/5ML PO SUSP: 30.0000 mL | ORAL | Status: DC | PRN

## 2021-07-11 MED ORDER — FENTANYL CITRATE PF 50 MCG/ML IJ SOSY
25.0000 ug | PREFILLED_SYRINGE | INTRAMUSCULAR | Status: DC | PRN
  Administered 2021-07-11 (×2): 50 ug via INTRAVENOUS

## 2021-07-11 MED ORDER — ONDANSETRON HCL 4 MG/2ML IJ SOLN
INTRAMUSCULAR | Status: AC
  Filled 2021-07-11: qty 2

## 2021-07-11 MED ORDER — ROCURONIUM BROMIDE 10 MG/ML (PF) SYRINGE
PREFILLED_SYRINGE | INTRAVENOUS | Status: DC | PRN
  Administered 2021-07-11: 30 mg via INTRAVENOUS
  Administered 2021-07-11: 10 mg via INTRAVENOUS

## 2021-07-11 MED ORDER — BUPIVACAINE LIPOSOME 1.3 % IJ SUSP
INTRAMUSCULAR | Status: DC | PRN
  Administered 2021-07-11: 10 mL

## 2021-07-11 MED ORDER — FENTANYL CITRATE (PF) 100 MCG/2ML IJ SOLN
INTRAMUSCULAR | Status: AC
  Filled 2021-07-11: qty 2

## 2021-07-11 MED ORDER — TRANEXAMIC ACID-NACL 1000-0.7 MG/100ML-% IV SOLN
1000.0000 mg | INTRAVENOUS | Status: AC
  Administered 2021-07-11: 1000 mg via INTRAVENOUS
  Filled 2021-07-11: qty 100

## 2021-07-11 MED ORDER — OXYCODONE HCL 5 MG PO TABS
10.0000 mg | ORAL_TABLET | ORAL | Status: DC | PRN
  Administered 2021-07-11 (×2): 10 mg via ORAL
  Administered 2021-07-12 (×2): 15 mg via ORAL
  Administered 2021-07-12 – 2021-07-13 (×2): 10 mg via ORAL
  Filled 2021-07-11 (×3): qty 3
  Filled 2021-07-11: qty 2

## 2021-07-11 MED ORDER — SODIUM CHLORIDE (PF) 0.9 % IJ SOLN
INTRAMUSCULAR | Status: AC
  Filled 2021-07-11: qty 30

## 2021-07-11 MED ORDER — PANTOPRAZOLE SODIUM 40 MG PO TBEC
40.0000 mg | DELAYED_RELEASE_TABLET | Freq: Every day | ORAL | Status: DC
  Administered 2021-07-11 – 2021-07-14 (×4): 40 mg via ORAL
  Filled 2021-07-11 (×4): qty 1

## 2021-07-11 MED ORDER — HYDRALAZINE HCL 10 MG PO TABS
10.0000 mg | ORAL_TABLET | Freq: Three times a day (TID) | ORAL | Status: DC
  Administered 2021-07-11 – 2021-07-13 (×6): 10 mg via ORAL
  Filled 2021-07-11 (×8): qty 1

## 2021-07-11 MED ORDER — LIDOCAINE 2% (20 MG/ML) 5 ML SYRINGE
INTRAMUSCULAR | Status: DC | PRN
Start: 2021-07-11 — End: 2021-07-11
  Administered 2021-07-11: 80 mg via INTRAVENOUS

## 2021-07-11 MED ORDER — DEXAMETHASONE SODIUM PHOSPHATE 10 MG/ML IJ SOLN
INTRAMUSCULAR | Status: DC | PRN
  Administered 2021-07-11: 8 mg via INTRAVENOUS

## 2021-07-11 MED ORDER — LEVETIRACETAM 500 MG PO TABS
500.0000 mg | ORAL_TABLET | Freq: Two times a day (BID) | ORAL | Status: DC
  Administered 2021-07-11 – 2021-07-14 (×6): 500 mg via ORAL
  Filled 2021-07-11 (×6): qty 1

## 2021-07-11 MED ORDER — SUCCINYLCHOLINE CHLORIDE 200 MG/10ML IV SOSY
PREFILLED_SYRINGE | INTRAVENOUS | Status: AC
  Filled 2021-07-11: qty 10

## 2021-07-11 MED ORDER — TRANEXAMIC ACID 1000 MG/10ML IV SOLN
2000.0000 mg | INTRAVENOUS | Status: DC
  Filled 2021-07-11: qty 20

## 2021-07-11 MED ORDER — POLYETHYLENE GLYCOL 3350 17 G PO PACK
17.0000 g | PACK | Freq: Every day | ORAL | Status: DC | PRN
  Administered 2021-07-14: 17 g via ORAL
  Filled 2021-07-11: qty 1

## 2021-07-11 MED ORDER — ACETAMINOPHEN 10 MG/ML IV SOLN
INTRAVENOUS | Status: AC
  Filled 2021-07-11: qty 100

## 2021-07-11 MED ORDER — LEVOTHYROXINE SODIUM 25 MCG PO TABS
25.0000 ug | ORAL_TABLET | Freq: Every day | ORAL | Status: DC
  Administered 2021-07-12 – 2021-07-14 (×3): 25 ug via ORAL
  Filled 2021-07-11 (×3): qty 1

## 2021-07-11 MED ORDER — MAGNESIUM HYDROXIDE 400 MG/5ML PO SUSP
30.0000 mL | Freq: Every day | ORAL | Status: DC | PRN
  Filled 2021-07-11: qty 30

## 2021-07-11 MED ORDER — ONDANSETRON HCL 4 MG/2ML IJ SOLN: 4.0000 mg | Freq: Four times a day (QID) | INTRAMUSCULAR | Status: DC | PRN

## 2021-07-11 MED ORDER — WARFARIN SODIUM 2.5 MG PO TABS: 7.5000 mg | ORAL_TABLET | ORAL | Status: DC

## 2021-07-11 MED ORDER — COLCHICINE 0.6 MG PO TABS
0.6000 mg | ORAL_TABLET | Freq: Every day | ORAL | Status: DC
  Administered 2021-07-11 – 2021-07-13 (×3): 0.6 mg via ORAL
  Filled 2021-07-11 (×3): qty 1

## 2021-07-11 MED ORDER — ONDANSETRON HCL 4 MG PO TABS: 4.0000 mg | ORAL_TABLET | Freq: Four times a day (QID) | ORAL | Status: DC | PRN

## 2021-07-11 MED ORDER — HYDRALAZINE HCL 20 MG/ML IJ SOLN
INTRAMUSCULAR | Status: AC
  Filled 2021-07-11: qty 1

## 2021-07-11 MED ORDER — ESMOLOL HCL 100 MG/10ML IV SOLN
INTRAVENOUS | Status: DC | PRN
  Administered 2021-07-11: 30 mg via INTRAVENOUS

## 2021-07-11 MED ORDER — BUPIVACAINE LIPOSOME 1.3 % IJ SUSP
INTRAMUSCULAR | Status: AC
  Filled 2021-07-11: qty 10

## 2021-07-11 MED ORDER — SENNA 8.6 MG PO TABS
1.0000 | ORAL_TABLET | Freq: Two times a day (BID) | ORAL | Status: DC
  Administered 2021-07-11 – 2021-07-14 (×6): 8.6 mg via ORAL
  Filled 2021-07-11 (×6): qty 1

## 2021-07-11 MED ORDER — PRAVASTATIN SODIUM 20 MG PO TABS
20.0000 mg | ORAL_TABLET | Freq: Every day | ORAL | Status: DC
  Administered 2021-07-11 – 2021-07-13 (×3): 20 mg via ORAL
  Filled 2021-07-11 (×3): qty 1

## 2021-07-11 MED ORDER — KCL IN DEXTROSE-NACL 20-5-0.45 MEQ/L-%-% IV SOLN
INTRAVENOUS | Status: DC
  Filled 2021-07-11 (×2): qty 1000

## 2021-07-11 MED ORDER — FENTANYL CITRATE (PF) 250 MCG/5ML IJ SOLN
INTRAMUSCULAR | Status: AC
  Filled 2021-07-11: qty 5

## 2021-07-11 MED ORDER — WARFARIN SODIUM 5 MG PO TABS: 5.0000 mg | ORAL_TABLET | Freq: Every evening | ORAL | Status: DC

## 2021-07-11 MED ORDER — PROPOFOL 10 MG/ML IV BOLUS
INTRAVENOUS | Status: DC | PRN
  Administered 2021-07-11: 140 mg via INTRAVENOUS

## 2021-07-11 MED ORDER — OXYCODONE HCL 5 MG PO TABS
5.0000 mg | ORAL_TABLET | ORAL | Status: DC | PRN
  Administered 2021-07-12: 10 mg via ORAL
  Administered 2021-07-13: 5 mg via ORAL
  Administered 2021-07-13 (×2): 10 mg via ORAL
  Administered 2021-07-13: 5 mg via ORAL
  Administered 2021-07-13 – 2021-07-14 (×3): 10 mg via ORAL
  Filled 2021-07-11 (×5): qty 2
  Filled 2021-07-11: qty 1
  Filled 2021-07-11 (×4): qty 2
  Filled 2021-07-11: qty 1

## 2021-07-11 MED ORDER — METOCLOPRAMIDE HCL 5 MG/ML IJ SOLN: 5.0000 mg | Freq: Three times a day (TID) | INTRAMUSCULAR | Status: DC | PRN

## 2021-07-11 MED ORDER — NITROGLYCERIN 0.4 MG SL SUBL: 0.4000 mg | SUBLINGUAL_TABLET | SUBLINGUAL | Status: DC | PRN

## 2021-07-11 MED ORDER — CEFAZOLIN SODIUM-DEXTROSE 2-4 GM/100ML-% IV SOLN
2.0000 g | INTRAVENOUS | Status: AC
  Administered 2021-07-11: 2 g via INTRAVENOUS
  Filled 2021-07-11: qty 100

## 2021-07-11 MED ORDER — BUPIVACAINE LIPOSOME 1.3 % IJ SUSP: 10.0000 mL | Freq: Once | INTRAMUSCULAR | Status: DC

## 2021-07-11 MED ORDER — PHENYLEPHRINE HCL-NACL 20-0.9 MG/250ML-% IV SOLN
INTRAVENOUS | Status: DC | PRN
  Administered 2021-07-11: 30 ug/min via INTRAVENOUS

## 2021-07-11 MED ORDER — CHLORHEXIDINE GLUCONATE 0.12 % MT SOLN
15.0000 mL | Freq: Once | OROMUCOSAL | Status: AC
  Administered 2021-07-11: 15 mL via OROMUCOSAL

## 2021-07-11 MED ORDER — WARFARIN - PHYSICIAN DOSING INPATIENT
Freq: Every day | Status: DC
Start: 2021-07-12 — End: 2021-07-14

## 2021-07-11 MED ORDER — ALBUMIN HUMAN 5 % IV SOLN: INTRAVENOUS | Status: DC | PRN

## 2021-07-11 MED ORDER — FLEET ENEMA 7-19 GM/118ML RE ENEM
1.0000 | ENEMA | Freq: Once | RECTAL | Status: DC | PRN
Start: 2021-07-11 — End: 2021-07-14

## 2021-07-11 MED ORDER — STERILE WATER FOR IRRIGATION IR SOLN
Status: DC | PRN
Start: 2021-07-11 — End: 2021-07-11
  Administered 2021-07-11: 2000 mL

## 2021-07-11 MED ORDER — 0.9 % SODIUM CHLORIDE (POUR BTL) OPTIME
TOPICAL | Status: DC | PRN
  Administered 2021-07-11: 1000 mL

## 2021-07-11 MED ORDER — TRANEXAMIC ACID 1000 MG/10ML IV SOLN
INTRAVENOUS | Status: DC | PRN
  Administered 2021-07-11: 2000 mg via TOPICAL

## 2021-07-11 MED ORDER — METOCLOPRAMIDE HCL 5 MG PO TABS: 5.0000 mg | ORAL_TABLET | Freq: Three times a day (TID) | ORAL | Status: DC | PRN

## 2021-07-11 MED ORDER — FENTANYL CITRATE PF 50 MCG/ML IJ SOSY
PREFILLED_SYRINGE | INTRAMUSCULAR | Status: AC
  Administered 2021-07-11: 50 ug via INTRAVENOUS
  Filled 2021-07-11: qty 3

## 2021-07-11 MED ORDER — ACETAMINOPHEN 500 MG PO TABS
1000.0000 mg | ORAL_TABLET | Freq: Four times a day (QID) | ORAL | Status: AC
  Administered 2021-07-11 – 2021-07-12 (×3): 1000 mg via ORAL
  Filled 2021-07-11 (×3): qty 2

## 2021-07-11 MED ORDER — SUCCINYLCHOLINE CHLORIDE 200 MG/10ML IV SOSY
PREFILLED_SYRINGE | INTRAVENOUS | Status: DC | PRN
  Administered 2021-07-11: 140 mg via INTRAVENOUS

## 2021-07-11 MED ORDER — ORAL CARE MOUTH RINSE: 15.0000 mL | Freq: Once | OROMUCOSAL | Status: AC

## 2021-07-11 MED ORDER — TORSEMIDE 20 MG PO TABS
20.0000 mg | ORAL_TABLET | Freq: Two times a day (BID) | ORAL | Status: DC
  Administered 2021-07-12 – 2021-07-14 (×5): 20 mg via ORAL
  Filled 2021-07-11 (×5): qty 1

## 2021-07-11 SURGICAL SUPPLY — 42 items
BAG COUNTER SPONGE SURGICOUNT (BAG) ×2 IMPLANT
BAG DECANTER FOR FLEXI CONT (MISCELLANEOUS) ×4 IMPLANT
BLADE SAW SGTL 18X1.27X75 (BLADE) ×2 IMPLANT
COVER PERINEAL POST (MISCELLANEOUS) ×2 IMPLANT
COVER SURGICAL LIGHT HANDLE (MISCELLANEOUS) ×2 IMPLANT
CUP ACETBLR 52 OD 100 SERIES (Hips) ×1 IMPLANT
DRAPE STERI IOBAN 125X83 (DRAPES) ×2 IMPLANT
DRAPE U-SHAPE 47X51 STRL (DRAPES) ×4 IMPLANT
DRSG AQUACEL AG ADV 3.5X10 (GAUZE/BANDAGES/DRESSINGS) ×2 IMPLANT
DURAPREP 26ML APPLICATOR (WOUND CARE) ×2 IMPLANT
ELECT BLADE TIP CTD 4 INCH (ELECTRODE) ×2 IMPLANT
ELECT REM PT RETURN 15FT ADLT (MISCELLANEOUS) ×2 IMPLANT
ELIMINATOR HOLE APEX DEPUY (Hips) ×1 IMPLANT
GLOVE SRG 8 PF TXTR STRL LF DI (GLOVE) ×1 IMPLANT
GLOVE SURG ENC MOIS LTX SZ7.5 (GLOVE) ×2 IMPLANT
GLOVE SURG ENC MOIS LTX SZ8.5 (GLOVE) ×2 IMPLANT
GLOVE SURG UNDER POLY LF SZ8 (GLOVE) ×1
GLOVE SURG UNDER POLY LF SZ9 (GLOVE) ×2 IMPLANT
GOWN STRL REUS W/TWL XL LVL3 (GOWN DISPOSABLE) ×4 IMPLANT
HEAD CERAMIC DELTA 36 PLUS 1.5 (Hips) ×1 IMPLANT
HOLDER FOLEY CATH W/STRAP (MISCELLANEOUS) ×2 IMPLANT
KIT TURNOVER KIT A (KITS) IMPLANT
LINER NEUTRAL 52X36MM PLUS 4 (Liner) ×1 IMPLANT
MANIFOLD NEPTUNE II (INSTRUMENTS) ×2 IMPLANT
NDL HYPO 21X1.5 SAFETY (NEEDLE) ×2 IMPLANT
NEEDLE HYPO 21X1.5 SAFETY (NEEDLE) ×4 IMPLANT
NS IRRIG 1000ML POUR BTL (IV SOLUTION) ×2 IMPLANT
PACK ANTERIOR HIP CUSTOM (KITS) ×2 IMPLANT
SPIKE FLUID TRANSFER (MISCELLANEOUS) ×2 IMPLANT
STEM FEMORAL SZ8 STD ACTIS (Stem) ×1 IMPLANT
SUT ETHIBOND NAB CT1 #1 30IN (SUTURE) ×2 IMPLANT
SUT VIC AB 0 CT1 27 (SUTURE)
SUT VIC AB 0 CT1 27XBRD ANBCTR (SUTURE) IMPLANT
SUT VIC AB 1 CTX 36 (SUTURE) ×1
SUT VIC AB 1 CTX36XBRD ANBCTR (SUTURE) ×1 IMPLANT
SUT VIC AB 2-0 CT1 27 (SUTURE) ×1
SUT VIC AB 2-0 CT1 TAPERPNT 27 (SUTURE) ×1 IMPLANT
SUT VIC AB 3-0 CT1 27 (SUTURE) ×1
SUT VIC AB 3-0 CT1 TAPERPNT 27 (SUTURE) ×1 IMPLANT
SYR CONTROL 10ML LL (SYRINGE) ×6 IMPLANT
TRAY FOLEY MTR SLVR 16FR STAT (SET/KITS/TRAYS/PACK) IMPLANT
TUBE SUCTION HIGH CAP CLEAR NV (SUCTIONS) ×1 IMPLANT

## 2021-07-11 NOTE — Op Note (Signed)
PATIENT ID:      Jacqueline Palmer  MRN:     [id number] DOB/AGE:    10/18/1945 / 75 y.o.  OPERATIVE REPORT   DATE OF PROCEDURE:  07/11/2021      PREOPERATIVE DIAGNOSIS:  RIGHT HIP OSTEOARTHRITS                                                         POSTOPERATIVE DIAGNOSIS:  Same                                                         PROCEDURE: Anterior R total hip arthroplasty using a 52 mm DePuy Pinnacle  Cup, [employer], 0-degree polyethylene liner, a +1.5 mm x 79mm ceramic head, a 8 std Depuy Actis stem  SURGEON: Kerin Salen  ASSISTANT:   Kerry Hough. [employer]  (present throughout entire procedure and necessary for timely completion of the procedure)   ANESTHESIA: Spinal, Exparel 133mg  injection BLOOD LOSS: 400 cc FLUID REPLACEMENT: 1600 cc crystalloid TRANEXAMIC ACID: 1gm IV, 2gm Topical COMPLICATIONS: none    INDICATIONS FOR PROCEDURE: A 76 y.o. year-old With  RIGHT HIP OSTEOARTHRITS   for 3 years, x-rays show bone-on-bone arthritic changes, and osteophytes. Despite conservative measures with observation, anti-inflammatory medicine, narcotics, use of a cane, has severe unremitting pain and can ambulate only a few blocks before resting. Patient desires elective R total hip arthroplasty to decrease pain and increase function. The risks, benefits, and alternatives were discussed at length including but not limited to the risks of infection, bleeding, nerve injury, stiffness, blood clots, the need for revision surgery, cardiopulmonary complications, among others, and they were willing to proceed. Questions answered      PROCEDURE IN DETAIL: The patient was identified by armband,   received preoperative IV antibiotics in the holding area at [facility], taken to the operating room , appropriate anesthetic monitors   were attached and anesthesia was induced with the patient on the gurney. HANA boots were applied to the feet, and the patient  was transferred to the HANA  table with a peroneal post and support underneath the non-operative leg. Theoperative lower extremity was then prepped and draped in the usual sterile fashion from just above the iliac crest to the knee. And a timeout procedure was performed. Kerry Hough. Hardin Negus [facility] was present and scrubbed throughout the case, critical for assistance with, positioning, exposure, retraction, instrumentation, and closure.Skin along incision area was injected with 10 cc of Exparel solution. We then made a 13 cm incision along the interval at the leading edge of the tensor fascia lata of starting at 2 cm lateral to the ASIS. Small bleeders in the skin and subcutaneous tissue identified and cauterized we dissected down to the fascia and made an incision in the fascia allowing [name] to elevate the fascia of the tensor muscle and exploited the interval between the rectus and the tensor fascia lata. A Cobra retractor was then placed along the superior neck of the femur. A cerebellar retractor was used to expose the interval between the tensor fascia lata and the rectus femoris.  We identified  and cauterized the ascending branch of the anterior circumflex artery. A second Cobra retractor along the inferior neck of the femur. A small Hohmann retractor was placed underneath the origin of the rectus femoris, giving [name] good medial exposure. Using Ronguers fatty tissue was removed from in front of the anterior capsule. The capsule was then incised, starting out at the superior anterior rim of the acetabulum going laterally along the anterior neck. The capsule was then teed along the neck superiorly and inferiorly. Electrocautery was used to release capsule from the anterior and medial neck of the femur to allow external rotation. Cobra retractors were then placed along the inferior and superior neck allowing [name] to perform a standard neck cut and removed the femoral head with a power corkscrew. We then placed a medium bent homan retractor in the  cotyloid notch and posteriorly along the acetabular rim a narrow Cobra retractor. Exposed labral tissue and osteophytes were then removed. We then sequentially reamed up to a 51 mm basket reamer obtaining good coverage in all quadrants, verified by C-arm imaging. Under C-arm control we then hammered into place a 52 mm Pinnacle cup in 45 of abduction and 15 of anteversion. The cup seated nicely and required no supplemental screws. We then placed a central hole Eliminator and a 0 polyethylene liner. The foot was then externally rotated to 130-140. The limb was extended and adducted to the floor, delivering the proximal femur up into the wound. A medium curved Hohmann retractor was placed over the greater trochanter and a long Homan retractor along the posterior femoral neck completing the exposure and lateralizing the femur. We then performed releases superiorly and and inferiorly of the capsule going back to the pirformis fossa superiorly and to the lesser trochanter inferiorly. We then entered the proximal femur with the box cutting offset chisel followed by, a canal sounder, the chili pepper and broaching up to a 8 broach. This seated nicely and we reamed the calcar. A trial reduction was performed with a 1.5 mm X 36 mm head.The limb lengths were excellent the hip was stable in 90 of external rotation. At this point the trial components removed and we hammered into place a # 8 std  Offset Actis stem with Gryption coating. A + 1.5 mm x 36 ceramichead was then hammered into place. The hip was reduced and final C-arm images obtained. The wound was thoroughly irrigated with normal saline solution. We repaired the ant capsule and the tensor fascia lot a with running 0 vicryl suture. the subcutaneous tissue was closed with 2-0 and 3-0 Vicryl suture followed by an Aquacil dressing. At this point the patient was awaken and transferred to hospital gurney without difficulty.   Kerin Salen 07/11/2021, 10:07 AM

## 2021-07-11 NOTE — Evaluation (Signed)
Physical Therapy Evaluation Patient Details Name: Jacqueline Palmer MRN: [id number] DOB: June 30, 1945 Today's Date: 07/11/2021  History of Present Illness  Pt. is a 76 y.o female s/p R anterior THA on 2/20 from a gradual onset of pain and functional disability. PMH AAA, AKI, neuropathic pain, seizures, CHF, nonischemic cardiomyopathy, Biventricular automatic implantable cardioverter defibrillator in situ, CABG, renal cell carcinoma of R kidney, brain metastasis, chronic a-fib.  Clinical Impression   Pt presents with generalized weakness, R hip pain, RLE post-op weakness, min difficulty transferring OOB, and decreased activity tolerance vs baseline. Pt to benefit from acute PT to address deficits. Pt requiring light assist for stand at EOB, could not progress to gait secondary to dizziness and post-op pain. PT to progress mobility as tolerated, and will continue to follow acutely.         Recommendations for follow up therapy are one component of a multi-disciplinary discharge planning process, led by the attending physician.  Recommendations may be updated based on patient status, additional functional criteria and insurance authorization.  Follow Up Recommendations Follow physician's recommendations for discharge plan and follow up therapies (pt preference for PT services at [facility])    Assistance Recommended at Discharge Frequent or constant Supervision/Assistance  Patient can return home with the following  A little help with walking and/or transfers;A little help with bathing/dressing/bathroom;Assistance with cooking/housework;Help with stairs or ramp for entrance;Assist for transportation    Equipment Recommendations None recommended by PT  Recommendations for Other Services       Functional Status Assessment Patient has had a recent decline in their functional status and demonstrates the ability to make significant improvements in function in a reasonable and predictable amount of  time.     Precautions / Restrictions Precautions Precautions: Fall Precaution Comments: direct anterior - no precautions Restrictions Weight Bearing Restrictions: No RLE Weight Bearing: Weight bearing as tolerated      Mobility  Bed Mobility Overal bed mobility: Needs Assistance Bed Mobility: Supine to Sit, Sit to Supine     Supine to sit: Min assist Sit to supine: Min assist   General bed mobility comments: assist for RLE management, cues for sequencing    Transfers Overall transfer level: Needs assistance Equipment used: Rolling walker (2 wheels) Transfers: Sit to/from Stand Sit to Stand: Min assist           General transfer comment: assist for power up, rise, steadying, Cues for hand placement.    Ambulation/Gait               General Gait Details: nt - pt reporting dizziness  Stairs            Wheelchair Mobility    Modified Rankin (Stroke Patients Only)       Balance Overall balance assessment: Needs assistance Sitting-balance support: No upper extremity supported, Feet supported Sitting balance-Leahy Scale: Fair     Standing balance support: Bilateral upper extremity supported, During functional activity, Reliant on assistive device for balance Standing balance-Leahy Scale: Poor                               Pertinent Vitals/Pain Pain Assessment Pain Assessment: Faces Faces Pain Scale: Hurts even more Pain Location: R hip Pain Descriptors / Indicators: Sore, Discomfort Pain Intervention(s): Limited activity within patient's tolerance, Monitored during session, Repositioned, Premedicated before session    Home Living Family/patient expects to be discharged to:: Private residence Living Arrangements: Alone Available Help  at Discharge: Personal care attendant;Family;Available PRN/intermittently Type of Home: Independent living facility Home Access: Level entry       Home Layout: One level Home Equipment: Cane -  single Barista (2 wheels);Rollator (4 wheels);Shower seat      Prior Function Prior Level of Function : Needs assist             Mobility Comments: pt reports intermittent assist for bathing, has an aide to help as needed. All meals delivered to room. Pt using cane PTA       Hand Dominance   Dominant Hand: Right    Extremity/Trunk Assessment   Upper Extremity Assessment Upper Extremity Assessment: Defer to OT evaluation    Lower Extremity Assessment Lower Extremity Assessment: Generalized weakness;RLE deficits/detail RLE Deficits / Details: anticipated post-op pain; able to perform quad set, ankle pump, heel slide to 45 degrees knee flexion RLE: Unable to fully assess due to pain    Cervical / Trunk Assessment Cervical / Trunk Assessment: Normal  Communication   Communication: No difficulties  Cognition Arousal/Alertness: Awake/alert Behavior During Therapy: WFL for tasks assessed/performed Overall Cognitive Status: Within Functional Limits for tasks assessed                                          General Comments      Exercises     Assessment/Plan    PT Assessment Patient needs continued PT services  PT Problem List Decreased strength;Decreased mobility;Decreased balance;Decreased knowledge of use of DME;Pain;Decreased activity tolerance;Decreased safety awareness;Decreased range of motion       PT Treatment Interventions DME instruction;Therapeutic activities;Gait training;Therapeutic exercise;Patient/family education;Balance training;Functional mobility training;Neuromuscular re-education    PT Goals (Current goals can be found in the Care Plan section)  Acute Rehab PT Goals Patient Stated Goal: home PT Goal Formulation: With patient Time For Goal Achievement: 07/18/21 Potential to Achieve Goals: Good    Frequency 7X/week     Co-evaluation               AM-PAC PT "6 Clicks" Mobility  Outcome Measure Help  needed turning from your back to your side while in a flat bed without using bedrails?: A Little Help needed moving from lying on your back to sitting on the side of a flat bed without using bedrails?: A Little Help needed moving to and from a bed to a chair (including a wheelchair)?: A Little Help needed standing up from a chair using your arms (e.g., wheelchair or bedside chair)?: A Little Help needed to walk in hospital room?: A Lot Help needed climbing 3-5 steps with a railing? : A Lot 6 Click Score: 16    End of Session   Activity Tolerance: Patient tolerated treatment well Patient left: with bed alarm set;in bed;with nursing/sitter in room;with call bell/phone within reach Nurse Communication: Mobility status PT Visit Diagnosis: Other abnormalities of gait and mobility (R26.89);Muscle weakness (generalized) (M62.81)    Time: 8828-0034 PT Time Calculation (min) (ACUTE ONLY): 22 min   Charges:   PT Evaluation $PT Eval Low Complexity: 1 Low        Inaaya Vellucci S, PT DPT Acute Rehabilitation Services Pager [phone]  Office [phone]   Roxine Caddy E Ruffin Pyo 07/11/2021, 7:12 PM

## 2021-07-11 NOTE — Plan of Care (Signed)
Plan of care reviewed and discussed with the patient. 

## 2021-07-11 NOTE — Progress Notes (Signed)
PT Cancellation Note  Patient Details Name: Jacqueline Palmer MRN: [id number] DOB: [birth year]   Cancelled Treatment:    Reason Eval/Treat Not Completed: Pain limiting ability to participate - pt states she was in 10+/10 pain, coming down to 8/10 since receiving pain medications but wants PT to check back later. Will check back as able.   Stacie Glaze, PT DPT Acute Rehabilitation Services Pager [phone]  Office [phone]    Louis Matte 07/11/2021, 5:18 PM

## 2021-07-11 NOTE — Anesthesia Procedure Notes (Signed)
Procedure Name: Intubation Date/Time: 07/11/2021 11:16 AM Performed by: Cleda Daub, CRNA Pre-anesthesia Checklist: Patient identified, Emergency Drugs available, Suction available and Patient being monitored Patient Re-evaluated:Patient Re-evaluated prior to induction Oxygen Delivery Method: Circle system utilized Preoxygenation: Pre-oxygenation with 100% oxygen Induction Type: IV induction Laryngoscope Size: Glidescope and 4 Grade View: Grade I Tube type: Oral Number of attempts: 1 Airway Equipment and Method: Stylet Placement Confirmation: ETT inserted through vocal cords under direct vision, positive ETCO2 and breath sounds checked- equal and bilateral Secured at: 21 cm Tube secured with: Tape Dental Injury: Teeth and Oropharynx as per pre-operative assessment

## 2021-07-11 NOTE — Anesthesia Preprocedure Evaluation (Addendum)
Anesthesia Evaluation  Patient identified by MRN, date of birth, ID band Patient awake    Reviewed: Allergy & Precautions, NPO status , Patient's Chart, lab work & pertinent test results  Airway Mallampati: II  TM Distance: >3 FB     Dental   Pulmonary former smoker,    breath sounds clear to auscultation       Cardiovascular hypertension, + CAD and +CHF  + pacemaker  Rhythm:Regular Rate:Normal  History noted Dr. Nyoka Cowden   Neuro/Psych Seizures -,     GI/Hepatic negative GI ROS, Neg liver ROS,   Endo/Other  Hypothyroidism   Renal/GU Renal disease     Musculoskeletal  (+) Arthritis ,   Abdominal   Peds  Hematology   Anesthesia Other Findings   Reproductive/Obstetrics                            Anesthesia Physical Anesthesia Plan  ASA: 3  Anesthesia Plan: General   Post-op Pain Management:    Induction: Intravenous  PONV Risk Score and Plan: 3 and Ondansetron, Dexamethasone and Midazolam  Airway Management Planned: Oral ETT  Additional Equipment:   Intra-op Plan:   Post-operative Plan: Extubation in OR  Informed Consent: I have reviewed the patients History and Physical, chart, labs and discussed the procedure including the risks, benefits and alternatives for the proposed anesthesia with the patient or authorized representative who has indicated his/her understanding and acceptance.     Dental advisory given  Plan Discussed with: CRNA, Anesthesiologist and Surgeon  Anesthesia Plan Comments:        Anesthesia Quick Evaluation

## 2021-07-11 NOTE — Anesthesia Postprocedure Evaluation (Signed)
Anesthesia Post Note  Patient: Jacqueline Palmer  Procedure(s) Performed: RIGHT TOTAL HIP ARTHROPLASTY ANTERIOR APPROACH (Right: Hip)     Patient location during evaluation: PACU Anesthesia Type: General Level of consciousness: awake Pain management: pain level controlled Vital Signs Assessment: post-procedure vital signs reviewed and stable Respiratory status: spontaneous breathing Cardiovascular status: stable Postop Assessment: no apparent nausea or vomiting Anesthetic complications: no   No notable events documented.  Last Vitals:  Vitals:   07/11/21 1415 07/11/21 1430  BP: (!) 144/79 131/77  Pulse: 73 70  Resp: 15 15  Temp:    SpO2: 95% 94%    Last Pain:  Vitals:   07/11/21 1445  TempSrc:   PainSc: Asleep        RLE Motor Response: Purposeful movement (07/11/21 1445) RLE Sensation: Full sensation (07/11/21 1445)      [name]

## 2021-07-11 NOTE — Transfer of Care (Signed)
Immediate Anesthesia Transfer of Care Note  Patient: Jacqueline Palmer  Procedure(s) Performed: RIGHT TOTAL HIP ARTHROPLASTY ANTERIOR APPROACH (Right: Hip)  Patient Location: PACU  Anesthesia Type:General  Level of Consciousness: awake, alert , oriented and patient cooperative  Airway & Oxygen Therapy: Patient Spontanous Breathing and Patient connected to face mask oxygen  Post-op Assessment: Report given to RN and Post -op Vital signs reviewed and stable  Post vital signs: Reviewed and stable  Last Vitals:  Vitals Value Taken Time  BP 190/98 07/11/21 1319  Temp    Pulse 70 07/11/21 1321  Resp 13 07/11/21 1321  SpO2 97 % 07/11/21 1321  Vitals shown include unvalidated device data.  Last Pain:  Vitals:   07/11/21 0911  TempSrc: Oral  PainSc:          Complications: No notable events documented.

## 2021-07-11 NOTE — Discharge Instructions (Signed)

## 2021-07-11 NOTE — Plan of Care (Signed)
Problem: Education: Goal: Knowledge of the prescribed therapeutic regimen will improve Outcome: Progressing   Problem: Education: Goal: Understanding of discharge needs will improve Outcome: Progressing   Problem: Activity: Goal: Ability to tolerate increased activity will improve Outcome: Progressing   Problem: Pain Management: Goal: Pain level will decrease with appropriate interventions Outcome: Progressing   Ivan Anchors, RN 07/11/21 8:36 PM

## 2021-07-11 NOTE — Interval H&P Note (Signed)
History and Physical Interval Note:  07/11/2021 10:06 AM  Jacqueline Palmer  has presented today for surgery, with the diagnosis of RIGHT HIP OSTEOARTHRITS.  The various methods of treatment have been discussed with the patient and family. After consideration of risks, benefits and other options for treatment, the patient has consented to  Procedure(s): RIGHT TOTAL HIP ARTHROPLASTY ANTERIOR APPROACH (Right) as a surgical intervention.  The patient's history has been reviewed, patient examined, no change in status, stable for surgery.  I have reviewed the patient's chart and labs.  Questions were answered to the patient's satisfaction.     Kerin Salen

## 2021-07-12 ENCOUNTER — Encounter (HOSPITAL_COMMUNITY): Payer: Self-pay | Admitting: Orthopedic Surgery

## 2021-07-12 DIAGNOSIS — M89751 Major osseous defect, right pelvic region and thigh: Secondary | ICD-10-CM | POA: Diagnosis present

## 2021-07-12 DIAGNOSIS — M25751 Osteophyte, right hip: Secondary | ICD-10-CM | POA: Diagnosis present

## 2021-07-12 DIAGNOSIS — E039 Hypothyroidism, unspecified: Secondary | ICD-10-CM | POA: Diagnosis present

## 2021-07-12 DIAGNOSIS — Z79899 Other long term (current) drug therapy: Secondary | ICD-10-CM | POA: Diagnosis not present

## 2021-07-12 DIAGNOSIS — N184 Chronic kidney disease, stage 4 (severe): Secondary | ICD-10-CM | POA: Diagnosis present

## 2021-07-12 DIAGNOSIS — Z91040 Latex allergy status: Secondary | ICD-10-CM | POA: Diagnosis not present

## 2021-07-12 DIAGNOSIS — I5032 Chronic diastolic (congestive) heart failure: Secondary | ICD-10-CM | POA: Diagnosis present

## 2021-07-12 DIAGNOSIS — D62 Acute posthemorrhagic anemia: Secondary | ICD-10-CM | POA: Diagnosis not present

## 2021-07-12 DIAGNOSIS — Z905 Acquired absence of kidney: Secondary | ICD-10-CM | POA: Diagnosis not present

## 2021-07-12 DIAGNOSIS — Z90710 Acquired absence of both cervix and uterus: Secondary | ICD-10-CM | POA: Diagnosis not present

## 2021-07-12 DIAGNOSIS — Z79891 Long term (current) use of opiate analgesic: Secondary | ICD-10-CM | POA: Diagnosis not present

## 2021-07-12 DIAGNOSIS — M109 Gout, unspecified: Secondary | ICD-10-CM | POA: Diagnosis present

## 2021-07-12 DIAGNOSIS — Z85528 Personal history of other malignant neoplasm of kidney: Secondary | ICD-10-CM | POA: Diagnosis not present

## 2021-07-12 DIAGNOSIS — I4821 Permanent atrial fibrillation: Secondary | ICD-10-CM | POA: Diagnosis present

## 2021-07-12 DIAGNOSIS — M879 Osteonecrosis, unspecified: Secondary | ICD-10-CM | POA: Diagnosis present

## 2021-07-12 DIAGNOSIS — Z7901 Long term (current) use of anticoagulants: Secondary | ICD-10-CM | POA: Diagnosis not present

## 2021-07-12 DIAGNOSIS — E669 Obesity, unspecified: Secondary | ICD-10-CM | POA: Diagnosis present

## 2021-07-12 DIAGNOSIS — Z79890 Hormone replacement therapy (postmenopausal): Secondary | ICD-10-CM | POA: Diagnosis not present

## 2021-07-12 DIAGNOSIS — Z952 Presence of prosthetic heart valve: Secondary | ICD-10-CM | POA: Diagnosis not present

## 2021-07-12 DIAGNOSIS — I7121 Aneurysm of the ascending aorta, without rupture: Secondary | ICD-10-CM | POA: Diagnosis present

## 2021-07-12 DIAGNOSIS — I428 Other cardiomyopathies: Secondary | ICD-10-CM | POA: Diagnosis present

## 2021-07-12 DIAGNOSIS — I130 Hypertensive heart and chronic kidney disease with heart failure and stage 1 through stage 4 chronic kidney disease, or unspecified chronic kidney disease: Secondary | ICD-10-CM | POA: Diagnosis present

## 2021-07-12 DIAGNOSIS — Z951 Presence of aortocoronary bypass graft: Secondary | ICD-10-CM | POA: Diagnosis not present

## 2021-07-12 DIAGNOSIS — I2510 Atherosclerotic heart disease of native coronary artery without angina pectoris: Secondary | ICD-10-CM | POA: Diagnosis present

## 2021-07-12 LAB — BASIC METABOLIC PANEL
Anion gap: 7 (ref 5–15)
BUN: 34 mg/dL — ABNORMAL HIGH (ref 8–23)
CO2: 22 mmol/L (ref 22–32)
Calcium: 8.8 mg/dL — ABNORMAL LOW (ref 8.9–10.3)
Chloride: 105 mmol/L (ref 98–111)
Creatinine, Ser: 2.28 mg/dL — ABNORMAL HIGH (ref 0.44–1.00)
GFR, Estimated: 22 mL/min — ABNORMAL LOW (ref >60)
Glucose, Bld: 127 mg/dL — ABNORMAL HIGH (ref 70–99)
Potassium: 5.1 mmol/L (ref 3.5–5.1)
Sodium: 134 mmol/L — ABNORMAL LOW (ref 135–145)

## 2021-07-12 LAB — CBC
HCT: 26.8 % — ABNORMAL LOW (ref 36.0–46.0)
Hemoglobin: 8.7 g/dL — ABNORMAL LOW (ref 12.0–15.0)
MCH: 25 pg — ABNORMAL LOW (ref 26.0–34.0)
MCHC: 32.5 g/dL (ref 30.0–36.0)
MCV: 77 fL — ABNORMAL LOW (ref 80.0–100.0)
Platelets: 190 10*3/uL (ref 150–400)
RBC: 3.48 MIL/uL — ABNORMAL LOW (ref 3.87–5.11)
RDW: 14.1 % (ref 11.5–15.5)
WBC: 14.7 10*3/uL — ABNORMAL HIGH (ref 4.0–10.5)
nRBC: 0 % (ref 0.0–0.2)

## 2021-07-12 LAB — PROTIME-INR
INR: 1.3 — ABNORMAL HIGH (ref 0.8–1.2)
Prothrombin Time: 15.7 seconds — ABNORMAL HIGH (ref 11.4–15.2)

## 2021-07-12 MED ORDER — DEXTROSE-NACL 5-0.45 % IV SOLN
INTRAVENOUS | Status: DC
Start: 2021-07-12 — End: 2021-07-14

## 2021-07-12 NOTE — Progress Notes (Signed)
Physical Therapy Treatment °Patient Details °Name: Jacqueline Palmer °MRN: [id number] °DOB: 05/27/1945 °Today's Date: 07/12/2021 ° ° °History of Present Illness Pt. is a 75 y.o female s/p R anterior THA on 2/20 from a gradual onset of pain and functional disability. PMH AAA, AKI, neuropathic pain, seizures, CHF, nonischemic cardiomyopathy, Biventricular automatic implantable cardioverter defibrillator in situ, CABG, renal cell carcinoma of R kidney, brain metastasis, chronic a-fib. ° °  °PT Comments  ° ° POD # 1 pm session °Assisted out of recliner to amb to bathroom for a second time today.  General transfer comment: assist for power up, rise, steadying, Cues for hand placement.  Required increased time.  Very slow to move. Also assisted with a toilet transfer. General Gait Details: limited amb distance to and from bathroom only due to effort and fatigue.  Very slow gait. Pain 7/10.  Difficulty advancing R LE. °Pt is NOT voiding.  <25 cc this am and nothing this attempt.  Reported to RN.  Pt also moving slow and has yet to amb in hallway.  Pt has NOT met her mobility goals to safely D/C to day. °  °Recommendations for follow up therapy are one component of a multi-disciplinary discharge planning process, led by the attending physician.  Recommendations may be updated based on patient status, additional functional criteria and insurance authorization. ° °Follow Up Recommendations ° Follow physician's recommendations for discharge plan and follow up therapies °  °  °Assistance Recommended at Discharge    °Patient can return home with the following A little help with walking and/or transfers;A little help with bathing/dressing/bathroom;Assistance with cooking/housework;Help with stairs or ramp for entrance;Assist for transportation °  °Equipment Recommendations ° None recommended by PT  °  °Recommendations for Other Services   ° ° °  °Precautions / Restrictions Precautions °Precautions: Fall °Precaution Comments: direct  anterior - no precautions °Restrictions °Weight Bearing Restrictions: No °RLE Weight Bearing: Weight bearing as tolerated  °  ° °Mobility ° Bed Mobility °  °  °  °  °  °  °  °General bed mobility comments: OOB in recliner °  ° °Transfers °Overall transfer level: Needs assistance °Equipment used: Rolling walker (2 wheels) °Transfers: Sit to/from Stand °  °  °  °  °  °  °General transfer comment: assist for power up, rise, steadying, Cues for hand placement.  Required increased time.  Very slow to move. Also assisted with a toilet transfer. °  ° °Ambulation/Gait °Ambulation/Gait assistance: Min assist °Gait Distance (Feet): 22 Feet °Assistive device: Rolling walker (2 wheels) °Gait Pattern/deviations: Step-to pattern, Decreased stance time - right °Gait velocity: decreased °  °  °General Gait Details: limited amb distance to and from bathroom only due to effort and fatigue.  Very slow gait. Pain 7/10.  Difficulty advancing R LE. ° ° °Stairs °  °  °  °  °  ° ° °Wheelchair Mobility °  ° °Modified Rankin (Stroke Patients Only) °  ° ° °  °Balance   °  °  °  °  °  °  °  °  °  °  °  °  °  °  °  °  °  °  °  ° °  °Cognition Arousal/Alertness: Awake/alert °Behavior During Therapy: WFL for tasks assessed/performed °Overall Cognitive Status: Within Functional Limits for tasks assessed °  °  °  °  °  °  °  °  °  °  °  °  °  °  °  °  °  General Comments: AxO x 3 very Educated Lady from [place] Comments        Pertinent Vitals/Pain Pain Assessment Pain Assessment: 0-10 Pain Score: 7  Pain Location: R hip Pain Descriptors / Indicators: Sore, Discomfort, Tender, Operative site guarding Pain Intervention(s): Monitored during session, Premedicated before session, Repositioned, Ice applied    Home Living                          Prior Function            PT Goals (current goals can now be found in the care plan section) Progress towards PT goals: Progressing toward  goals    Frequency    7X/week      PT Plan Current plan remains appropriate    Co-evaluation              AM-PAC PT "6 Clicks" Mobility   Outcome Measure  Help needed turning from your back to your side while in a flat bed without using bedrails?: A Little Help needed moving from lying on your back to sitting on the side of a flat bed without using bedrails?: A Little Help needed moving to and from a bed to a chair (including a wheelchair)?: A Little Help needed standing up from a chair using your arms (e.g., wheelchair or bedside chair)?: A Little Help needed to walk in hospital room?: A Lot Help needed climbing 3-5 steps with a railing? : Total 6 Click Score: 15    End of Session Equipment Utilized During Treatment: Gait belt Activity Tolerance: No increased pain;Patient limited by fatigue Patient left: in chair;with call bell/phone within reach;with bed alarm set Nurse Communication: Mobility status PT Visit Diagnosis: Other abnormalities of gait and mobility (R26.89);Muscle weakness (generalized) (M62.81)     Time: 1432-1500 PT Time Calculation (min) (ACUTE ONLY): 28 min  Charges:  $Gait Training: 8-22 mins $Therapeutic Activity: 8-22 mins                     {Jatara Huettner  PTA Acute  Rehabilitation Services Pager      [phone] Office      [phone]

## 2021-07-12 NOTE — TOC Transition Note (Signed)
Transition of Care [facility]) - CM/SW Discharge Note  Patient Details  Name: Jacqueline Palmer MRN: [id number] Date of Birth: Oct 08, 1945  Transition of Care [facility]) CM/SW Contact:  Sherie Don, LCSW Phone Number: 07/12/2021, 9:37 AM  Clinical Narrative: Patient is expected to discharge home after working with PT. CSW met with patient to review discharge plan. Patient will return to [facility] and receive PT through [facility] onsite. Patient has a rolling walker, rollator, and cane so there are no DME needs at this time. TOC signing off.  Final next level of care: OP Rehab Barriers to Discharge: No Barriers Identified  Patient Goals and CMS Choice Patient states their goals for this hospitalization and ongoing recovery are:: Return to [place] and receive PT through [facility] offered to / list presented to : NA  Discharge Plan and Services          DME Arranged: N/A DME Agency: NA  Readmission Risk Interventions No flowsheet data found.

## 2021-07-12 NOTE — Progress Notes (Signed)
Physical Therapy Treatment Patient Details Name: Jacqueline Palmer MRN: [id number] DOB: 1945-09-18 Today's Date: 07/12/2021   History of Present Illness Pt. is a 76 y.o female s/p R anterior THA on 2/20 from a gradual onset of pain and functional disability. PMH AAA, AKI, neuropathic pain, seizures, CHF, nonischemic cardiomyopathy, Biventricular automatic implantable cardioverter defibrillator in situ, CABG, renal cell carcinoma of R kidney, brain metastasis, chronic a-fib.    PT Comments    POD # 1 am session Pt already OOB in recliner.  Assisted out of recliner required increased time to rise and 50% VC's on proper hand placement as well as tech.  General transfer comment: assist for power up, rise, steadying, Cues for hand placement.  Required increased time.  Very slow to move. General Gait Details: limited amb distance to and from bathroom only due to effort and fatigue.  Very slow gait. Pain 7/10.  Difficulty advancing R LE.Marland Kitchen  Assisted with amb back to recliner then performed a few THR TE's followed by ICE. Pt will need another PT session to increase amb distance/ability.   Recommendations for follow up therapy are one component of a multi-disciplinary discharge planning process, led by the attending physician.  Recommendations may be updated based on patient status, additional functional criteria and insurance authorization.  Follow Up Recommendations  Follow physician's recommendations for discharge plan and follow up therapies     Assistance Recommended at Discharge    Patient can return home with the following A little help with walking and/or transfers;A little help with bathing/dressing/bathroom;Assistance with cooking/housework;Help with stairs or ramp for entrance;Assist for transportation   Equipment Recommendations  None recommended by PT    Recommendations for Other Services       Precautions / Restrictions Precautions Precautions: Fall Precaution Comments: direct  anterior - no precautions Restrictions Weight Bearing Restrictions: No RLE Weight Bearing: Weight bearing as tolerated     Mobility  Bed Mobility               General bed mobility comments: OOB in recliner    Transfers Overall transfer level: Needs assistance Equipment used: Rolling walker (2 wheels) Transfers: Sit to/from Stand             General transfer comment: assist for power up, rise, steadying, Cues for hand placement.  Required increased time.  Very slow to move.    Ambulation/Gait Ambulation/Gait assistance: Min assist Gait Distance (Feet): 22 Feet Assistive device: Rolling walker (2 wheels) Gait Pattern/deviations: Step-to pattern, Decreased stance time - right Gait velocity: decreased     General Gait Details: limited amb distance to and from bathroom only due to effort and fatigue.  Very slow gait. Pain 7/10.  Difficulty advancing R LE.   Stairs             Wheelchair Mobility    Modified Rankin (Stroke Patients Only)       Balance                                            Cognition Arousal/Alertness: Awake/alert Behavior During Therapy: WFL for tasks assessed/performed Overall Cognitive Status: Within Functional Limits for tasks assessed                                 General Comments: AxO x 3 very Educated  Lady from [place]  Total Hip Replacement TE's following HEP Handout 10 reps ankle pumps 05 reps knee presses 05 reps heel slides 05 reps SAQ's 05 reps ABD Instructed how to use a belt loop to assist  Followed by ICE     General Comments        Pertinent Vitals/Pain Pain Assessment Pain Assessment: 0-10 Pain Score: 7  Pain Location: R hip Pain Descriptors / Indicators: Sore, Discomfort, Tender, Operative site guarding Pain Intervention(s): Monitored during session, Premedicated before session, Repositioned, Ice applied    Home Living                           Prior Function            PT Goals (current goals can now be found in the care plan section) Progress towards PT goals: Progressing toward goals    Frequency    7X/week      PT Plan Current plan remains appropriate    Co-evaluation              AM-PAC PT "6 Clicks" Mobility   Outcome Measure  Help needed turning from your back to your side while in a flat bed without using bedrails?: A Little Help needed moving from lying on your back to sitting on the side of a flat bed without using bedrails?: A Little Help needed moving to and from a bed to a chair (including a wheelchair)?: A Little Help needed standing up from a chair using your arms (e.g., wheelchair or bedside chair)?: A Little Help needed to walk in hospital room?: A Lot Help needed climbing 3-5 steps with a railing? : Total 6 Click Score: 15    End of Session Equipment Utilized During Treatment: Gait belt Activity Tolerance: No increased pain;Patient limited by fatigue Patient left: in chair;with call bell/phone within reach;with bed alarm set Nurse Communication: Mobility status PT Visit Diagnosis: Other abnormalities of gait and mobility (R26.89);Muscle weakness (generalized) (M62.81)     Time: 1194-1740 PT Time Calculation (min) (ACUTE ONLY): 39 min  Charges:  $Gait Training: 8-22 mins $Therapeutic Exercise: 8-22 mins $Therapeutic Activity: 8-22 mins                     {Koran Seabrook  PTA Acute  Rehabilitation Services Pager      [phone] Office      [phone]

## 2021-07-12 NOTE — Progress Notes (Signed)
Patient ID: Jacqueline Palmer, female   DOB: Aug 07, 1945, 76 y.o.   MRN: [id number] PATIENT ID: Jacqueline Palmer  MRN: [id number]  DOB/AGE:  [birth year] / 76 y.o.  1 Day Post-Op Procedure(s) (LRB): RIGHT TOTAL HIP ARTHROPLASTY ANTERIOR APPROACH (Right)    PROGRESS NOTE Subjective: Patient is alert, oriented, no Nausea, no Vomiting, yes passing gas, . Taking PO well. Denies SOB, Chest or Calf Pain. Using Incentive Spirometer, PAS in place. Ambulate WBAT in room Patient reports pain as  4/10  .    Objective: Vital signs in last 24 hours: Vitals:   07/11/21 1751 07/11/21 2013 07/12/21 0206 07/12/21 0612  BP: 123/80 119/75 104/69 104/69  Pulse: 71 70 70 70  Resp: 17 17 16 16   Temp: 97.8 F (36.6 C) 97.7 F (36.5 C) 98.5 F (36.9 C) 98.2 F (36.8 C)  TempSrc: Oral Oral Oral Oral  SpO2: 98% 96% 100% 100%  Weight:      Height:          Intake/Output from previous day: I/O last 3 completed shifts: In: 3303.2 [P.O.:600; I.V.:2153.2; IV Piggyback:550] Out: 2125 [Urine:1425; Blood:700]   Intake/Output this shift: Total I/O In: -  Out: 300 [Urine:300]   LABORATORY DATA: Recent Labs    07/11/21 0949 07/11/21 1559 07/12/21 0328  WBC  --  12.4* 14.7*  HGB  --  9.3* 8.7*  HCT  --  29.5* 26.8*  PLT  --  205 190  NA  --   --  134*  K  --   --  5.1  CL  --   --  105  CO2  --   --  22  BUN  --   --  34*  CREATININE  --  2.35* 2.28*  GLUCOSE  --   --  127*  INR 1.2  --  1.3*  CALCIUM  --   --  8.8*    Examination: Neurologically intact ABD soft Neurovascular intact Sensation intact distally Intact pulses distally Dorsiflexion/Plantar flexion intact Incision: dressing C/D/I No cellulitis present Compartment soft} XR AP&Lat of hip shows well placed\fixed THA  Assessment:   1 Day Post-Op Procedure(s) (LRB): RIGHT TOTAL HIP ARTHROPLASTY ANTERIOR APPROACH (Right) ADDITIONAL DIAGNOSIS:  Expected Acute Blood Loss Anemia, Gout, chronic atrial fibrillation,Hypertension, stage IV  renal failure, status post CABG with congestive heart failure, history of seizures,History of brain metastases from renal cell carcinoma  Patient's anticipated LOS is less than 2 midnights, meeting these requirements: - Younger than 26 - Lives within 1 hour of care - Has a competent adult at home to recover with post-op recover - NO history of  - Chronic pain requiring opiods  - Diabetes  - Coronary Artery Disease  - Heart failure  - Heart attack  - Stroke  - DVT/VTE  - Cardiac arrhythmia  - Respiratory Failure/COPD  - Renal failure  - Anemia  - Advanced Liver disease     Plan: PT/OT WBAT, THA  DVT Prophylaxis: SCDx72 hrs, ASA 81 mg BID x 2 weeks  DISCHARGE PLAN: Home  DISCHARGE NEEDS: HHPT, Walker, and 3-in-1 comode seat

## 2021-07-13 LAB — PROTIME-INR
INR: 1.4 — ABNORMAL HIGH (ref 0.8–1.2)
Prothrombin Time: 16.7 seconds — ABNORMAL HIGH (ref 11.4–15.2)

## 2021-07-13 LAB — CBC
HCT: 24.8 % — ABNORMAL LOW (ref 36.0–46.0)
Hemoglobin: 7.9 g/dL — ABNORMAL LOW (ref 12.0–15.0)
MCH: 24.5 pg — ABNORMAL LOW (ref 26.0–34.0)
MCHC: 31.9 g/dL (ref 30.0–36.0)
MCV: 77 fL — ABNORMAL LOW (ref 80.0–100.0)
Platelets: 155 10*3/uL (ref 150–400)
RBC: 3.22 MIL/uL — ABNORMAL LOW (ref 3.87–5.11)
RDW: 14 % (ref 11.5–15.5)
WBC: 11.7 10*3/uL — ABNORMAL HIGH (ref 4.0–10.5)
nRBC: 0 % (ref 0.0–0.2)

## 2021-07-13 LAB — PREPARE RBC (CROSSMATCH)

## 2021-07-13 MED ORDER — SODIUM CHLORIDE 0.9% IV SOLUTION: Freq: Once | INTRAVENOUS | Status: AC

## 2021-07-13 MED ORDER — HYDRALAZINE HCL 10 MG PO TABS
10.0000 mg | ORAL_TABLET | Freq: Three times a day (TID) | ORAL | Status: DC | PRN
  Filled 2021-07-13: qty 1

## 2021-07-13 MED ORDER — FUROSEMIDE 10 MG/ML IJ SOLN
20.0000 mg | Freq: Once | INTRAMUSCULAR | Status: AC
  Administered 2021-07-13: 20 mg via INTRAVENOUS
  Filled 2021-07-13: qty 2

## 2021-07-13 NOTE — Progress Notes (Signed)
Physical Therapy Treatment Patient Details Name: Jacqueline Palmer MRN: [id number] DOB: February 20, 1946 Today's Date: 07/13/2021   History of Present Illness Pt. is a 76 y.o female s/p R anterior THA on 2/20 from a gradual onset of pain and functional disability. PMH AAA, AKI, neuropathic pain, seizures, CHF, nonischemic cardiomyopathy, Biventricular automatic implantable cardioverter defibrillator in situ, CABG, renal cell carcinoma of R kidney, brain metastasis, chronic a-fib.    PT Comments    POD # 2 am session withheld due to blood transfusion needed first (one unit at least) PM session assisted OOB to amb to bathroom and back to recliner required increased time and effort.   Pt progressing slowly.  Pain is always 7/10 despite measures.   Pt will need another night stay and hopefully D/C tomorrow.  Need to increase her mobility out into hallway and introduce HEP.   Recommendations for follow up therapy are one component of a multi-disciplinary discharge planning process, led by the attending physician.  Recommendations may be updated based on patient status, additional functional criteria and insurance authorization.  Follow Up Recommendations  Follow physician's recommendations for discharge plan and follow up therapies     Assistance Recommended at Discharge Frequent or constant Supervision/Assistance  Patient can return home with the following A little help with walking and/or transfers;A little help with bathing/dressing/bathroom;Assistance with cooking/housework;Help with stairs or ramp for entrance;Assist for transportation   Equipment Recommendations  None recommended by PT    Recommendations for Other Services       Precautions / Restrictions Precautions Precautions: Fall Precaution Comments: direct anterior - no precautions Restrictions Weight Bearing Restrictions: No RLE Weight Bearing: Weight bearing as tolerated     Mobility  Bed Mobility Overal bed mobility: Needs  Assistance Bed Mobility: Supine to Sit     Supine to sit: Min assist     General bed mobility comments: increased time and use of pad to complete scooting to EOB    Transfers Overall transfer level: Needs assistance Equipment used: Rolling walker (2 wheels) Transfers: Sit to/from Stand, Bed to chair/wheelchair/BSC Sit to Stand: Min assist Stand pivot transfers: Min assist         General transfer comment: assist for power up, rise, steadying, Cues for hand placement.  Required increased time.  Very slow to move. Also assisted with a toilet transfer.    Ambulation/Gait Ambulation/Gait assistance: Min guard, Supervision Gait Distance (Feet): 22 Feet Assistive device: Rolling walker (2 wheels) Gait Pattern/deviations: Step-to pattern, Decreased stance time - right Gait velocity: decreased     General Gait Details: limited amb distance to and from bathroom only due to effort and fatigue.  Very slow gait. Pain 7/10.  Difficulty advancing R LE.   Stairs             Wheelchair Mobility    Modified Rankin (Stroke Patients Only)       Balance                                            Cognition Arousal/Alertness: Awake/alert Behavior During Therapy: WFL for tasks assessed/performed Overall Cognitive Status: Within Functional Limits for tasks assessed                                 General Comments: AxO x 3 very Educated [place] from [place]  DC        Exercises      General Comments        Pertinent Vitals/Pain Pain Assessment Pain Assessment: 0-10 Pain Score: 7  Pain Location: R hip Pain Descriptors / Indicators: Sore, Discomfort, Tender, Operative site guarding Pain Intervention(s): Monitored during session, Premedicated before session, Repositioned, Ice applied    Home Living                          Prior Function            PT Goals (current goals can now be found in the care plan section)  Progress towards PT goals: Progressing toward goals    Frequency    7X/week      PT Plan Current plan remains appropriate    Co-evaluation              AM-PAC PT "6 Clicks" Mobility   Outcome Measure  Help needed turning from your back to your side while in a flat bed without using bedrails?: A Little Help needed moving from lying on your back to sitting on the side of a flat bed without using bedrails?: A Little Help needed moving to and from a bed to a chair (including a wheelchair)?: A Little Help needed standing up from a chair using your arms (e.g., wheelchair or bedside chair)?: A Little Help needed to walk in hospital room?: A Little Help needed climbing 3-5 steps with a railing? : A Little 6 Click Score: 18    End of Session Equipment Utilized During Treatment: Gait belt Activity Tolerance: No increased pain;Patient limited by fatigue Patient left: in chair;with call bell/phone within reach;with bed alarm set Nurse Communication: Mobility status PT Visit Diagnosis: Other abnormalities of gait and mobility (R26.89);Muscle weakness (generalized) (M62.81)     Time: 3875-6433 PT Time Calculation (min) (ACUTE ONLY): 28 min  Charges:  $Gait Training: 8-22 mins $Therapeutic Activity: 8-22 mins                     Rica Koyanagi  PTA Acute  Rehabilitation Services Pager      [phone] Office      [phone]

## 2021-07-13 NOTE — Progress Notes (Signed)
PATIENT ID: Jacqueline Palmer  MRN: [id number]  DOB/AGE:  October 27, 1945 / 76 y.o.  2 Days Post-Op Procedure(s) (LRB): RIGHT TOTAL HIP ARTHROPLASTY ANTERIOR APPROACH (Right)    PROGRESS NOTE Subjective: Patient is alert, oriented, no Nausea, no Vomiting, yes passing gas, . Taking PO well. Denies SOB, Chest or Calf Pain. Using Incentive Spirometer, PAS in place. Ambulate WBAT with pt working on transfers.  She was feeling weak. Patient reports pain as  5/10  .    Objective: Vital signs in last 24 hours: Vitals:   07/12/21 0921 07/12/21 1505 07/12/21 2114 07/13/21 0531  BP: 112/67 104/69 (!) 101/55 114/71  Pulse: 70 71 72 70  Resp: 20 20 15 16   Temp: 98.6 F (37 C) 98.7 F (37.1 C) 98 F (36.7 C) 98.5 F (36.9 C)  TempSrc: Oral Oral Oral Oral  SpO2: 100% 97% 92% 100%  Weight:      Height:          Intake/Output from previous day: I/O last 3 completed shifts: In: 2449.6 [P.O.:960; I.V.:1389.6; IV Piggyback:100] Out: 1225 [Urine:1225]   Intake/Output this shift: No intake/output data recorded.   LABORATORY DATA: Recent Labs    07/11/21 1559 07/12/21 0328 07/13/21 0335  WBC 12.4* 14.7* 11.7*  HGB 9.3* 8.7* 7.9*  HCT 29.5* 26.8* 24.8*  PLT 205 190 155  NA  --  134*  --   K  --  5.1  --   CL  --  105  --   CO2  --  22  --   BUN  --  34*  --   CREATININE 2.35* 2.28*  --   GLUCOSE  --  127*  --   INR  --  1.3* 1.4*  CALCIUM  --  8.8*  --     Examination: Neurologically intact Neurovascular intact Sensation intact distally Intact pulses distally Dorsiflexion/Plantar flexion intact Incision: dressing C/D/I and scant drainage No cellulitis present Compartment soft} XR AP&Lat of hip shows well placed\fixed THA  Assessment:   2 Days Post-Op Procedure(s) (LRB): RIGHT TOTAL HIP ARTHROPLASTY ANTERIOR APPROACH (Right) ADDITIONAL DIAGNOSIS:  Expected Acute Blood Loss Anemia, Gout, chronic atrial fibrillation,Hypertension, stage IV renal failure, status post CABG with  congestive heart failure, history of seizures,History of brain metastases from renal cell carcinoma Anticipated LOS equal to or greater than 2 midnights due to - Age 70 and older with one or more of the following:  - Obesity  - Expected need for hospital services (PT, OT, Nursing) required for safe  discharge  - Anticipated need for postoperative skilled nursing care or inpatient rehab  - Active co-morbidities: Coronary Artery Disease, Cardiac Arrhythmia, and Anemia OR   - Unanticipated findings during/Post Surgery: Slow post-op progression: GI, pain control, mobility    Plan: PT/OT WBAT, THA  DVT Prophylaxis: SCDx72 hrs, ASA 81 mg BID x 2 weeks  DISCHARGE PLAN: Home  DISCHARGE NEEDS: HHPT, Walker, and 3-in-1 comode seat  Plan to give 2 units of RBC's with lasix in between units.

## 2021-07-14 LAB — BPAM RBC
Blood Product Expiration Date: 202303222359
Blood Product Expiration Date: 202303232359
ISSUE DATE / TIME: 202302221209
ISSUE DATE / TIME: 202302221659
Unit Type and Rh: 5100
Unit Type and Rh: 5100

## 2021-07-14 LAB — CBC
HCT: 29 % — ABNORMAL LOW (ref 36.0–46.0)
Hemoglobin: 10 g/dL — ABNORMAL LOW (ref 12.0–15.0)
MCH: 27 pg (ref 26.0–34.0)
MCHC: 34.5 g/dL (ref 30.0–36.0)
MCV: 78.2 fL — ABNORMAL LOW (ref 80.0–100.0)
Platelets: 127 10*3/uL — ABNORMAL LOW (ref 150–400)
RBC: 3.71 MIL/uL — ABNORMAL LOW (ref 3.87–5.11)
RDW: 15.8 % — ABNORMAL HIGH (ref 11.5–15.5)
WBC: 9.9 10*3/uL (ref 4.0–10.5)
nRBC: 0 % (ref 0.0–0.2)

## 2021-07-14 LAB — TYPE AND SCREEN
ABO/RH(D): O POS
Antibody Screen: NEGATIVE
Unit division: 0
Unit division: 0

## 2021-07-14 LAB — PROTIME-INR
INR: 1.4 — ABNORMAL HIGH (ref 0.8–1.2)
Prothrombin Time: 17.3 seconds — ABNORMAL HIGH (ref 11.4–15.2)

## 2021-07-14 NOTE — Progress Notes (Signed)
PATIENT ID: Jacqueline Palmer  MRN: [id number]  DOB/AGE:  1945-07-15 / 76 y.o.  3 Days Post-Op Procedure(s) (LRB): RIGHT TOTAL HIP ARTHROPLASTY ANTERIOR APPROACH (Right)    PROGRESS NOTE Subjective: Patient is alert, oriented, no Nausea, no Vomiting, yes passing gas, . Taking PO well.  Pt making urine. Denies SOB, Chest or Calf Pain. Using Incentive Spirometer, PAS in place. Ambulate WBAT with pt walking 22 ft with therapy. Patient reports pain as  6/10  .    Objective: Vital signs in last 24 hours: Vitals:   07/13/21 1720 07/13/21 2059 07/13/21 2132 07/14/21 0518  BP: (!) 89/64 101/69 116/73 123/69  Pulse: 70 70 70 69  Resp: 17 16 16 16   Temp: 98.9 F (37.2 C) 98.5 F (36.9 C) 97.7 F (36.5 C) 98.2 F (36.8 C)  TempSrc: Oral Oral Oral Oral  SpO2: 100% 99%  100%  Weight:      Height:          Intake/Output from previous day: I/O last 3 completed shifts: In: 1647.4 [P.O.:240; I.V.:480.3; Blood:927.2] Out: 1500 [Urine:1500]   Intake/Output this shift: No intake/output data recorded.   LABORATORY DATA: Recent Labs    07/11/21 1559 07/12/21 0328 07/13/21 0335 07/14/21 0329  WBC 12.4* 14.7* 11.7* 9.9  HGB 9.3* 8.7* 7.9* 10.0*  HCT 29.5* 26.8* 24.8* 29.0*  PLT 205 190 155 127*  NA  --  134*  --   --   K  --  5.1  --   --   CL  --  105  --   --   CO2  --  22  --   --   BUN  --  34*  --   --   CREATININE 2.35* 2.28*  --   --   GLUCOSE  --  127*  --   --   INR  --  1.3* 1.4* 1.4*  CALCIUM  --  8.8*  --   --     Examination: Neurologically intact Neurovascular intact Sensation intact distally Intact pulses distally Dorsiflexion/Plantar flexion intact No cellulitis present Compartment soft} XR AP&Lat of hip shows well placed\fixed THA  Assessment:   3 Days Post-Op Procedure(s) (LRB): RIGHT TOTAL HIP ARTHROPLASTY ANTERIOR APPROACH (Right) ADDITIONAL DIAGNOSIS:  Expected Acute Blood Loss Anemia,  Gout, chronic atrial fibrillation,Hypertension, stage IV renal  failure, status post CABG with congestive heart failure, history of seizures,History of brain metastases from renal cell carcinoma  Anticipated LOS equal to or greater than 2 midnights due to - Age 4 and older with one or more of the following:  - Obesity  - Expected need for hospital services (PT, OT, Nursing) required for safe  discharge  - Anticipated need for postoperative skilled nursing care or inpatient rehab    OR   - Unanticipated findings during/Post Surgery: Slow post-op progression: GI, pain control, mobility     Plan: PT/OT WBAT, THA  DVT Prophylaxis: SCDx72 hrs, Lovenox to coumadin bridge  DISCHARGE PLAN: Home  DISCHARGE NEEDS: HHPT, Walker, and 3-in-1 comode seat

## 2021-07-14 NOTE — Progress Notes (Signed)
Patient discharged to home w/ family. Given all belongings, instructions. Verbalized understanding of all instructions. Escorted to pov via w/c. 

## 2021-07-14 NOTE — Discharge Summary (Signed)
Patient ID: Jacqueline Palmer MRN: [id number] DOB/AGE: 1946/01/03 76 y.o.  Admit date: 07/11/2021 Discharge date: 07/14/2021  Admission Diagnoses:  Principal Problem:   Avascular necrosis of bone of right hip [facility])   Discharge Diagnoses:  Same  Past Medical History:  Diagnosis Date   Acute kidney injury ([place])    Arthritis    Atrial fibrillation ([place])    Cancer ([place])    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   Cardiomyopathy ([place])    CHF (congestive heart failure) ([place])    Chronic a-fib ([place]) 08/14/2016   Chronic anticoagulation 08/14/2016   Coronary artery disease    4v CABG 6/17   Graves disease    Hypertension    Hypothyroidism    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 female who was admitted 07/11/2021 for operative treatment ofAvascular necrosis of bone of right hip ([place]). Patient has severe unremitting pain that affects sleep, daily activities, and work/hobbies. After pre-op clearance the patient was taken to the operating room on 07/11/2021 and underwent  Procedure(s): RIGHT TOTAL HIP ARTHROPLASTY ANTERIOR APPROACH.    Patient was given perioperative antibiotics:  Anti-infectives (From admission, onward)    Start     Dose/Rate Route Frequency Ordered Stop   07/11/21 0830  ceFAZolin (ANCEF) IVPB 2g/100 mL premix        2 g 200 mL/hr over 30 Minutes Intravenous On call to O.R. 07/11/21 0825 07/11/21 1117        Patient was given sequential compression devices, early ambulation, and chemoprophylaxis to prevent DVT.  Patient benefited maximally  from hospital stay and there were no complications.    Recent vital signs: Patient Vitals for the past 24 hrs:  BP Temp Temp src Pulse Resp SpO2  07/14/21 0518 123/69 98.2 F (36.8 C) Oral 69 16 100 %  07/13/21 2132 116/73 97.7 F (36.5 C) Oral 70 16 --  07/13/21 2059 101/69 98.5 F (36.9 C) Oral 70 16 99 %  07/13/21 1720 (!) 89/64 98.9 F (37.2 C) Oral 70 17 100 %  07/13/21 1653 94/63 99.3 F (37.4 C) Oral 70 16 100 %  07/13/21 1651 -- -- -- 70 -- 99 %  07/13/21 1600 101/75 -- -- 70 16 99 %  07/13/21 1320 107/65 99 F (37.2 C) Oral 70 20 98 %  07/13/21 1232 (!) 89/68 98.3 F (36.8 C) Oral 70 18 96 %  07/13/21 1157 90/63 98.2 F (36.8 C) Oral 70 16 92 %     Recent laboratory studies:  Recent Labs    07/11/21 1559 07/11/21 1559 07/12/21 0328 07/13/21 0335 07/14/21 0329  WBC 12.4*  --  14.7* 11.7* 9.9  HGB 9.3*  --  8.7* 7.9* 10.0*  HCT 29.5*  --  26.8* 24.8* 29.0*  PLT 205  --  190 155 127*  NA  --   --  134*  --   --   K  --   --  5.1  --   --   CL  --   --  105  --   --  CO2  --   --  22  --   --   BUN  --   --  34*  --   --   CREATININE 2.35*  --  2.28*  --   --   GLUCOSE  --   --  127*  --   --   INR  --    < > 1.3* 1.4* 1.4*  CALCIUM  --   --  8.8*  --   --    < > = values in this interval not displayed.     Discharge Medications:   Allergies as of 07/14/2021       Reactions   Avocado Shortness Of Breath   Before 2007   Banana Anaphylaxis, Swelling   Plantain Anaphylaxis, Swelling   Ace Inhibitors Cough   Codeine Other (See Comments)   Nausea and Feels Jittery   Prednisone Swelling   Of face and lower extremities   Tape Hives   Tape  Does better with paper tape    Latex Rash   Pharmabase Cosmetic [aquamed] Swelling, Rash   Some Soap, perfumes,detergent        Medication List     TAKE these medications    acetaminophen 500 MG tablet Commonly known as: TYLENOL Take 1,000 mg by mouth in the morning and at bedtime.   colchicine 0.6 MG  tablet Take 1 tablet (0.6 mg total) by mouth at bedtime.   enoxaparin 80 MG/0.8ML injection Commonly known as: LOVENOX Inject 0.8 mLs (80 mg total) into the skin every 12 (twelve) hours.   hydrALAZINE 10 MG tablet Commonly known as: APRESOLINE TAKE ONE TABLET BY MOUTH THREE TIMES DAILY   levETIRAcetam 500 MG tablet Commonly known as: Keppra Take 1 tablet (500 mg total) by mouth 2 (two) times daily.   levothyroxine 25 MCG tablet Commonly known as: SYNTHROID TAKE ONE TABLET BY MOUTH ONE TIME DAILY 30 to 60 minutes before breakfast on an empty stomach with a full glass of water   metoprolol succinate 25 MG 24 hr tablet Commonly known as: TOPROL-XL Take 1 tablet (25 mg total) by mouth daily.   nitroGLYCERIN 0.4 MG SL tablet Commonly known as: NITROSTAT Place 1 tablet (0.4 mg total) under the tongue every 5 (five) minutes as needed for chest pain.   OVER THE COUNTER MEDICATION Apply 1 application topically 2 (two) times daily. Pain cream in groin area   polyethylene glycol 17 g packet Commonly known as: MIRALAX / GLYCOLAX Take 17 g by mouth daily as needed for mild constipation.   pravastatin 20 MG tablet Commonly known as: PRAVACHOL Take 20 mg by mouth at bedtime.   spironolactone 25 MG tablet Commonly known as: ALDACTONE Take 1 tablet (25 mg total) by mouth daily.   torsemide 20 MG tablet Commonly known as: DEMADEX Take 20 mg by mouth 2 (two) times daily.   traMADol 50 MG tablet Commonly known as: ULTRAM Take 50 mg by mouth 2 (two) times daily.   warfarin 5 MG tablet Commonly known as: COUMADIN Take as directed. If you are unsure how to take this medication, talk to your nurse or doctor. Original instructions: Take 1 tablet (5 mg total) by mouth every evening. What changed:  how much to take additional instructions               Durable Medical Equipment  (From admission, onward)           Start     Ordered  07/11/21 1539  DME Walker rolling   Once       Question:  Patient needs a walker to treat with the following condition  Answer:  Status post right hip replacement   07/11/21 1538   07/11/21 1539  DME 3 n 1  Once        07/11/21 1538              Discharge Care Instructions  (From admission, onward)           Start     Ordered   07/14/21 0000  Weight bearing as tolerated        07/14/21 1001            Diagnostic Studies: DG C-Arm 1-60 Min-No Report  Result Date: 07/11/2021 Fluoroscopy was utilized by the requesting physician.  No radiographic interpretation.   DG C-Arm 1-60 Min-No Report  Result Date: 07/11/2021 Fluoroscopy was utilized by the requesting physician.  No radiographic interpretation.   DG HIP UNILAT WITH PELVIS 1V RIGHT  Result Date: 07/11/2021 CLINICAL DATA:  Fluoroscopic assistance for right hip arthroplasty EXAM: DG HIP (WITH OR WITHOUT PELVIS) 1V RIGHT COMPARISON:  None. FINDINGS: Fluoroscopic images show right hip arthroplasty. Surgical clips are seen in the pelvis. Fluoroscopic time was 15 seconds. Radiation dose is 1.9 mGy. IMPRESSION: Fluoroscopic assistance was provided for right hip arthroplasty. Electronically Signed   By: Elmer Picker M.D.   On: 07/11/2021 12:57    Disposition: Discharge disposition: 01-Home or Self Care       Discharge Instructions     Call MD / Call 911   Complete by: As directed    If you experience chest pain or shortness of breath, CALL 911 and be transported to the hospital emergency room.  If you develope a fever above 101 F, pus (white drainage) or increased drainage or redness at the wound, or calf pain, call your surgeon's office.   Constipation Prevention   Complete by: As directed    Drink plenty of fluids.  Prune juice may be helpful.  You may use a stool softener, such as Colace (over the counter) 100 mg twice a day.  Use MiraLax (over the counter) for constipation as needed.   Diet - low sodium heart healthy   Complete by: As  directed    Driving restrictions   Complete by: As directed    No driving for 2 weeks   Increase activity slowly as tolerated   Complete by: As directed    Patient may shower   Complete by: As directed    You may shower without a dressing once there is no drainage.  Do not wash over the wound.  If drainage remains, cover wound with plastic wrap and then shower.   Post-operative opioid taper instructions:   Complete by: As directed    POST-OPERATIVE OPIOID TAPER INSTRUCTIONS: It is important to wean off of your opioid medication as soon as possible. If you do not need pain medication after your surgery it is ok to stop day one. Opioids include: Codeine, Hydrocodone(Norco, Vicodin), Oxycodone(Percocet, oxycontin) and hydromorphone amongst others.  Long term and even short term use of opiods can cause: Increased pain response Dependence Constipation Depression Respiratory depression And more.  Withdrawal symptoms can include Flu like symptoms Nausea, vomiting And more Techniques to manage these symptoms Hydrate well Eat regular healthy meals Stay active Use relaxation techniques(deep breathing, meditating, yoga) Do Not substitute Alcohol to help with tapering If you have been  on opioids for less than two weeks and do not have pain than it is ok to stop all together.  Plan to wean off of opioids This plan should start within one week post op of your joint replacement. Maintain the same interval or time between taking each dose and first decrease the dose.  Cut the total daily intake of opioids by one tablet each day Next start to increase the time between doses. The last dose that should be eliminated is the evening dose.      Weight bearing as tolerated   Complete by: As directed         Follow-up Information     Frederik Pear, MD Follow up in 2 week(s).   Specialty: Orthopedic Surgery Contact information: [place] [address] [address] [phone]                   Signed: Joanell Rising 07/14/2021, 10:02 AM

## 2021-07-14 NOTE — Progress Notes (Signed)
Physical Therapy Treatment Patient Details Name: Jacqueline Palmer MRN: [id number] DOB: [birth year] Today's Date: 07/14/2021   History of Present Illness Pt. is a 76 y.o female s/p R anterior THA on 2/20 from a gradual onset of pain and functional disability. PMH AAA, AKI, neuropathic pain, seizures, CHF, nonischemic cardiomyopathy, Biventricular automatic implantable cardioverter defibrillator in situ, CABG, renal cell carcinoma of R kidney, brain metastasis, chronic a-fib.    PT Comments    POD # 3 am session General Comments: AxO x 3 very Educated [place] from [place]. Pt already OOB in recliner.  General transfer comment: increased time but self able with good use of hands to steady self.  General Gait Details: tolerated an increased distance of 85 feet amb in hallway with increased time but steady.  Feeling "better" than yesterday.  Pt received 2 units of blood. No stairs to enter her [employer].  Then returned to room to perform some TE's following HEP handout.  Instructed on proper tech, freq as well as use of ICE.  Pt asked about a home [place] but due to out of pocket cost she plans to wear briefs at night.  Pt has all equipment.  Addressed all mobility questions, discussed appropriate activity, educated on use of ICE.  Pt ready for D/C to home.    Recommendations for follow up therapy are one component of a multi-disciplinary discharge planning process, led by the attending physician.  Recommendations may be updated based on patient status, additional functional criteria and insurance authorization.  Follow Up Recommendations  Home health PT     Assistance Recommended at Discharge Frequent or constant Supervision/Assistance  Patient can return home with the following A little help with walking and/or transfers;A little help with bathing/dressing/bathroom;Assistance with cooking/housework;Help with stairs or ramp for entrance;Assist for transportation   Equipment  Recommendations  None recommended by PT    Recommendations for Other Services       Precautions / Restrictions Precautions Precautions: Fall Precaution Comments: direct anterior - no precautions Restrictions Weight Bearing Restrictions: No RLE Weight Bearing: Weight bearing as tolerated     Mobility  Bed Mobility               General bed mobility comments: OOB in recliner    Transfers Overall transfer level: Needs assistance Equipment used: Rolling walker (2 wheels) Transfers: Sit to/from Stand Sit to Stand: Supervision, Min guard           General transfer comment: increased time but self able with good use of hands to steady self    Ambulation/Gait Ambulation/Gait assistance: Supervision Gait Distance (Feet): 85 Feet Assistive device: Rolling walker (2 wheels) Gait Pattern/deviations: Step-to pattern, Decreased stance time - right Gait velocity: decreased     General Gait Details: tolerated an increased distance of 85 feet amb in hallway with increased time but steady.  Feeling "better" than yesterday.  Pt received 2 units of blood.   Stairs Stairs:  (bo stairs to enter)           Engineer, building services [place] (Stroke Patients Only)       Balance                                            Cognition Arousal/Alertness: Awake/alert Behavior During Therapy: WFL for tasks assessed/performed Overall Cognitive Status: Within Functional Limits for  tasks assessed                                 General Comments: AxO x 3 very Educated [place] from [place]  Total Hip Replacement TE's following HEP Handout 10 reps ankle pumps 05 reps knee presses 05 reps heel slides 05 reps SAQ's 05 reps ABD Instructed how to use a belt loop to assist  Followed by ICE     General Comments        Pertinent Vitals/Pain Pain Assessment Pain Assessment: Faces Pain Score: 5  Pain Location:  R hip Pain Descriptors / Indicators: Sore, Discomfort, Tender, Operative site guarding Pain Intervention(s): Monitored during session, Premedicated before session, Repositioned, Ice applied    Home Living                          Prior Function            PT Goals (current goals can now be found in the care plan section) Progress towards PT goals: Progressing toward goals    Frequency    7X/week      PT Plan Current plan remains appropriate    Co-evaluation              AM-PAC PT "6 Clicks" Mobility   Outcome Measure  Help needed turning from your back to your side while in a flat bed without using bedrails?: A Little Help needed moving from lying on your back to sitting on the side of a flat bed without using bedrails?: A Little Help needed moving to and from a bed to a chair (including a wheelchair)?: A Little Help needed standing up from a chair using your arms (e.g., wheelchair or bedside chair)?: A Little Help needed to walk in hospital room?: A Little Help needed climbing 3-5 steps with a railing? : A Little 6 Click Score: 18    End of Session Equipment Utilized During Treatment: Gait belt Activity Tolerance: Patient tolerated treatment well Patient left: in chair;with call bell/phone within reach;with bed alarm set Nurse Communication: Mobility status PT Visit Diagnosis: Other abnormalities of gait and mobility (R26.89);Muscle weakness (generalized) (M62.81)     Time: 0102-7253 PT Time Calculation (min) (ACUTE ONLY): 45 min  Charges:  $Gait Training: 8-22 mins $Therapeutic Exercise: 8-22 mins $Therapeutic Activity: 8-22 mins                     {Bayne Fosnaugh  PTA Acute  Rehabilitation Services Pager      [phone] Office      [phone]

## 2021-07-15 ENCOUNTER — Telehealth: Payer: Self-pay

## 2021-07-15 NOTE — Telephone Encounter (Signed)
Transition Care Management Follow-up Telephone Call Date of discharge and from where: 07/14/2021, [facility] How have you been since you were released from the hospital? She stated that she is doing very well.  Any questions or concerns? No  Items Reviewed: Did the pt receive and understand the discharge instructions provided? Yes  Medications obtained and verified? Yes - no new medications ordered, no changes with her med regime.  Other? No  Any new allergies since your discharge? No  Dietary orders reviewed? No Do you have support at home? Yes   Home Care and Equipment/Supplies: Were home health services ordered? yes If so, what is the name of the agency? [place] agency set up a time to come to the patient's home? They are on-site at her residence/ Were any new equipment or medical supplies ordered?  No What is the name of the medical supply agency? N/a Were you able to get the supplies/equipment? not applicable Do you have any questions related to the use of the equipment or supplies? No  She said that the OT recommended that she obtain another grabber.   Functional Questionnaire: (I = Independent and D = Dependent) ADLs: needs assistance. Has RW for ambulation. WBAT RLE.   Follow up appointments reviewed:  PCP Hospital f/u appt confirmed? Yes  Scheduled to see Dr Wynetta Emery  - 08/18/2021. [facility] f/u appt confirmed? Yes  Scheduled to see INR check - 07/19/2021, cardiology - 07/26/2021, She needs to call the surgeon to schedule her follow up appointment.  Are transportation arrangements needed? No  If their condition worsens, is the pt aware to call PCP or go to the Emergency Dept.? Yes Was the patient provided with contact information for the PCP's office or ED? Yes Was to pt encouraged to call back with questions or concerns? Yes

## 2021-07-17 ENCOUNTER — Other Ambulatory Visit: Payer: Self-pay | Admitting: Internal Medicine

## 2021-07-17 DIAGNOSIS — I482 Chronic atrial fibrillation, unspecified: Secondary | ICD-10-CM

## 2021-07-19 ENCOUNTER — Ambulatory Visit (INDEPENDENT_AMBULATORY_CARE_PROVIDER_SITE_OTHER): Payer: Medicare Other | Admitting: *Deleted

## 2021-07-19 ENCOUNTER — Other Ambulatory Visit: Payer: Self-pay

## 2021-07-19 DIAGNOSIS — Z7901 Long term (current) use of anticoagulants: Secondary | ICD-10-CM

## 2021-07-19 DIAGNOSIS — Z5181 Encounter for therapeutic drug level monitoring: Secondary | ICD-10-CM

## 2021-07-19 DIAGNOSIS — I482 Chronic atrial fibrillation, unspecified: Secondary | ICD-10-CM

## 2021-07-19 LAB — POCT INR: INR: 3.5 — AB (ref 2.0–3.0)

## 2021-07-19 NOTE — Patient Instructions (Signed)
Description   Stop Lovenox.  Hold warfarin tonight.  Then START taking warfarin 1 tablet daily except for 1.5 tablets on Fridays. Recheck INR in 1 week. Coumadin Clinic [phone]

## 2021-07-20 ENCOUNTER — Other Ambulatory Visit: Payer: Self-pay | Admitting: Internal Medicine

## 2021-07-20 NOTE — Telephone Encounter (Signed)
Requested medications are due for refill today.  unsure ? ?Requested medications are on the active medications list.  yes ? ?Last refill. 05/30/2021 ? ?Future visit scheduled.   yes ? ?Notes to clinic.  Medication listed as historical. ? ? ? ?Requested Prescriptions  ?Pending Prescriptions Disp Refills  ? torsemide (DEMADEX) 20 MG tablet [Pharmacy Med Name: Torsemide Oral Tablet 20 MG] 90 tablet 0  ?  Sig: TAKE TWO TABLETS BY MOUTH IN THE MORNING AND ONE IN THE EVENING  ?  ? Cardiovascular:  Diuretics - Loop Failed - 07/20/2021  9:55 AM  ?  ?  Failed - Ca in normal range and within 180 days  ?  Calcium  ?Date Value Ref Range Status  ?07/12/2021 8.8 (L) 8.9 - 10.3 mg/dL Final  ?  ?  ?  ?  Failed - Na in normal range and within 180 days  ?  Sodium  ?Date Value Ref Range Status  ?07/12/2021 134 (L) 135 - 145 mmol/L Final  ?12/14/2020 141 134 - 144 mmol/L Final  ?  ?  ?  ?  Failed - Cr in normal range and within 180 days  ?  Creatinine  ?Date Value Ref Range Status  ?05/24/2021 2.93 (H) 0.44 - 1.00 mg/dL Final  ? ?Creatinine, Ser  ?Date Value Ref Range Status  ?07/12/2021 2.28 (H) 0.44 - 1.00 mg/dL Final  ?  ?  ?  ?  Failed - Mg Level in normal range and within 180 days  ?  No results found for: MG  ?  ?  ?  Passed - K in normal range and within 180 days  ?  Potassium  ?Date Value Ref Range Status  ?07/12/2021 5.1 3.5 - 5.1 mmol/L Final  ?  ?  ?  ?  Passed - Cl in normal range and within 180 days  ?  Chloride  ?Date Value Ref Range Status  ?07/12/2021 105 98 - 111 mmol/L Final  ?  ?  ?  ?  Passed - Last BP in normal range  ?  BP Readings from Last 1 Encounters:  ?07/14/21 123/69  ?  ?  ?  ?  Passed - Valid encounter within last 6 months  ?  Recent Outpatient Visits   ? ?      ? 3 months ago Essential hypertension  ? [place] Karle Plumber B, MD  ? 4 months ago COVID-19 virus infection  ? [place] Ladell Pier, MD  ? 5 months ago Encounter for  [employer] annual wellness exam  ? [place] Ladell Pier, MD  ? 7 months ago Essential hypertension  ? [place] Ladell Pier, MD  ? 11 months ago Essential hypertension  ? [place] Ladell Pier, MD  ? ?  ?  ?Future Appointments   ? ?        ? In 6 days Buford Dresser, MD [facility]  ? In 4 weeks Ladell Pier, MD [place]  ? ?  ? ?  ?  ?  ?  ?

## 2021-07-22 ENCOUNTER — Other Ambulatory Visit: Payer: Self-pay | Admitting: Internal Medicine

## 2021-07-22 DIAGNOSIS — I482 Chronic atrial fibrillation, unspecified: Secondary | ICD-10-CM

## 2021-07-22 NOTE — Telephone Encounter (Signed)
Requested Prescriptions  ?Pending Prescriptions Disp Refills  ?? warfarin (COUMADIN) 5 MG tablet [Pharmacy Med Name: Warfarin Sodium Oral Tablet 5 MG] 90 tablet 0  ?  Sig: TAKE ONE TABLET BY MOUTH DAILY IN THE EVENING  ?  ? Hematology:  Anticoagulants - warfarin Failed - 07/22/2021  1:42 PM  ?  ?  Failed - Manual Review: If patient's warfarin is managed by Anti-Coag team, route request to them. If not, route request to the provider.  ?  ?  Failed - INR in normal range and within 30 days  ?  INR  ?Date Value Ref Range Status  ?07/19/2021 3.5 (A) 2.0 - 3.0 Final  ?07/14/2021 1.4 (H) 0.8 - 1.2 Final  ?  Comment:  ?  (NOTE) ?INR goal varies based on device and disease states. ?Performed at [facility], [address] ?[place], [place] [address] ?  ?   ?  ?  Failed - HCT in normal range and within 360 days  ?  HCT  ?Date Value Ref Range Status  ?07/14/2021 29.0 (L) 36.0 - 46.0 % Final  ?   ?  ?  Failed - Valid encounter within last 3 months  ?  Recent Outpatient Visits   ?      ? 3 months ago Essential hypertension  ? [place] Karle Plumber B, MD  ? 4 months ago COVID-19 virus infection  ? [place] Ladell Pier, MD  ? 5 months ago Encounter for [employer] annual wellness exam  ? [place] Ladell Pier, MD  ? 7 months ago Essential hypertension  ? [place] Ladell Pier, MD  ? 11 months ago Essential hypertension  ? [place] Ladell Pier, MD  ?  ?  ?Future Appointments   ?        ? In 4 days Buford Dresser, MD [facility]  ? In 3 weeks Ladell Pier, MD [place]  ?  ? ?  ?  ?  Passed - Patient is not pregnant  ?  ?  ? ? ?

## 2021-07-26 ENCOUNTER — Ambulatory Visit (INDEPENDENT_AMBULATORY_CARE_PROVIDER_SITE_OTHER): Payer: Medicare Other | Admitting: Cardiology

## 2021-07-26 ENCOUNTER — Ambulatory Visit (INDEPENDENT_AMBULATORY_CARE_PROVIDER_SITE_OTHER): Payer: Medicare Other | Admitting: Pharmacist

## 2021-07-26 ENCOUNTER — Encounter (HOSPITAL_BASED_OUTPATIENT_CLINIC_OR_DEPARTMENT_OTHER): Payer: Self-pay | Admitting: Cardiology

## 2021-07-26 ENCOUNTER — Other Ambulatory Visit: Payer: Self-pay

## 2021-07-26 VITALS — BP 102/68 | HR 70 | Ht 68.0 in | Wt 187.8 lb

## 2021-07-26 DIAGNOSIS — Z951 Presence of aortocoronary bypass graft: Secondary | ICD-10-CM | POA: Diagnosis not present

## 2021-07-26 DIAGNOSIS — N184 Chronic kidney disease, stage 4 (severe): Secondary | ICD-10-CM

## 2021-07-26 DIAGNOSIS — I4821 Permanent atrial fibrillation: Secondary | ICD-10-CM

## 2021-07-26 DIAGNOSIS — I5032 Chronic diastolic (congestive) heart failure: Secondary | ICD-10-CM

## 2021-07-26 DIAGNOSIS — I482 Chronic atrial fibrillation, unspecified: Secondary | ICD-10-CM | POA: Diagnosis not present

## 2021-07-26 DIAGNOSIS — Z7901 Long term (current) use of anticoagulants: Secondary | ICD-10-CM

## 2021-07-26 DIAGNOSIS — I2510 Atherosclerotic heart disease of native coronary artery without angina pectoris: Secondary | ICD-10-CM

## 2021-07-26 DIAGNOSIS — Z952 Presence of prosthetic heart valve: Secondary | ICD-10-CM

## 2021-07-26 LAB — POCT INR: INR: 1.9 — AB (ref 2.0–3.0)

## 2021-07-26 NOTE — Patient Instructions (Signed)

## 2021-07-26 NOTE — Patient Instructions (Signed)
Description   ? ?Continue taking warfarin 1 tablet daily except for 1.5 tablets on Fridays. Recheck INR in 2 weeks. Coumadin Clinic [phone] ?  ? ? ?

## 2021-07-26 NOTE — Progress Notes (Deleted)
? ? ?Electrophysiology Office Note ?Date: 07/26/2021 ? ?ID:  [name], DOB Nov 14, 1945, MRN [id number] ? ?PCP: Ladell Pier, MD ?Primary Cardiologist: Buford Dresser, MD ?Electrophysiologist: Vickie Epley, MD  ? ?CC: Routine ICD follow-up ? ?Jacqueline Palmer is a 76 y.o. female seen today for Vickie Epley, MD for routine electrophysiology followup.  Since last being seen in our clinic the patient reports doing ***.  she denies chest pain, palpitations, dyspnea, PND, orthopnea, nausea, vomiting, dizziness, syncope, edema, weight gain, or early satiety. {He/she (caps):30048} has not had ICD shocks.  ? ?Device History: ?Boston Scientific BiV ICD implanted 08/2016 for NICM / Permanent AF ? ?Past Medical History:  ?Diagnosis Date  ? Acute kidney injury (Jacqueline Palmer)   ? Arthritis   ? Atrial fibrillation (Jacqueline Palmer)   ? Cancer [facility] Jacqueline [place])   ? 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 Cardiomyopathy (Jacqueline Palmer)   ? CHF (congestive heart failure) (Jacqueline Palmer)   ? Chronic a-fib (Jacqueline Palmer) 08/14/2016  ? Chronic anticoagulation 08/14/2016  ? Coronary artery disease   ? 4v CABG 6/17  ? Graves disease   ? Hypertension   ? Hypothyroidism   ? MVR 60m Edwards Perimount Plus Bioprosthetic 11/17/2015  ? SN [id number][From patient's surgical info card]  ? Presence of permanent cardiac pacemaker   ? Seizures (Jacqueline Palmer   ? Thrombus 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 myocardial infarction   ? Thyroid disease   ? ?Past Surgical History:  ?Procedure Laterality Date  ? ABDOMINAL HYSTERECTOMY    ? CORONARY ARTERY BYPASS GRAFT    ? CRANIOTOMY Right 07/30/2019  ? Right frontal  ? NEPHRECTOMY Right 10/16/2019  ? TOTAL HIP ARTHROPLASTY Right 07/11/2021  ? Procedure: RIGHT TOTAL HIP ARTHROPLASTY ANTERIOR APPROACH;  Surgeon: RFrederik Pear MD;  Location: [facility];  Service: Orthopedics;  Laterality: Right;  ? ? ?Current Outpatient Medications  ?Medication Sig Dispense Refill  ? acetaminophen (TYLENOL) 500 MG tablet Take 1,000 mg by mouth in the morning  and at bedtime.    ? colchicine 0.6 MG tablet Take 1 tablet (0.6 mg total) by mouth at bedtime. 90 tablet 3  ? ENDOCET 5-325 MG tablet Take 1 tablet by mouth every 6 (six) hours as needed.    ? hydrALAZINE (APRESOLINE) 10 MG tablet TAKE ONE TABLET BY MOUTH THREE TIMES DAILY 270 tablet 2  ? levETIRAcetam (KEPPRA) 500 MG tablet Take 1 tablet (500 mg total) by mouth 2 (two) times daily. 180 tablet 3  ? levothyroxine (SYNTHROID) 25 MCG tablet TAKE ONE TABLET BY MOUTH ONE TIME DAILY 30 to 60 minutes before breakfast on an empty stomach with a full glass of water 90 tablet 2  ? metoprolol succinate (TOPROL-XL) 25 MG 24 hr tablet Take 1 tablet (25 mg total) by mouth daily. 90 tablet 3  ? nitroGLYCERIN (NITROSTAT) 0.4 MG SL tablet Place 1 tablet (0.4 mg total) under the tongue every 5 (five) minutes as needed for chest pain. 25 tablet 3  ? OVER THE COUNTER MEDICATION Apply 1 application topically 2 (two) times daily. Pain cream in groin area    ? polyethylene glycol (MIRALAX / GLYCOLAX) 17 g packet Take 17 g by mouth daily as needed for mild constipation.    ? pravastatin (PRAVACHOL) 20 MG tablet Take 20 mg by mouth at bedtime.    ? spironolactone (ALDACTONE) 25 MG tablet Take 1 tablet (25 mg total) by mouth daily. 90 tablet 3  ? tiZANidine (ZANAFLEX) 2 MG tablet Take  2 mg by mouth as needed.    ? torsemide (DEMADEX) 20 MG tablet Take 20 mg by mouth 2 (two) times daily.    ? warfarin (COUMADIN) 5 MG tablet TAKE ONE TABLET BY MOUTH DAILY IN THE EVENING 90 tablet 0  ? ?No current facility-administered medications for this visit.  ? ? ?Allergies:   Avocado, Banana, Plantain, Ace inhibitors, Codeine, Prednisone, Tape, Latex, and Pharmabase cosmetic [aquamed]  ? ?Social History: ?Social History  ? ?Socioeconomic History  ? Marital status: Widowed  ?  Spouse name: Not on file  ? Number of children: 0  ? Years of education: Doctorate  ? Highest education level: Master's degree (e.g., MA, MS, MEng, MEd, MSW, MBA)  ?Occupational  History  ? Occupation: Retired Clinical biochemist  ?Tobacco Use  ? Smoking status: Former  ?  Types: Cigarettes  ?  Quit date: 07/30/2014  ?  Years since quitting: 6.9  ? Smokeless tobacco: Never  ?Vaping Use  ? Vaping Use: Never used  ?Substance and Sexual Activity  ? Alcohol use: Yes  ?  Comment: 1 glass wine twice weekly  ? Drug use: No  ? Sexual activity: Not on file  ?Other Topics Concern  ? Not on file  ?Social History Narrative  ? Right handed  ? Coffee daily/tea qod  ? ?Social Determinants of Health  ? ?Financial Resource Strain: Not on file  ?Food Insecurity: Not on file  ?Transportation Needs: Not on file  ?Physical Activity: Not on file  ?Stress: Not on file  ?Social Connections: Not on file  ?Intimate Partner Violence: Not on file  ? ? ?Family History: ?Family History  ?Problem Relation Age of Onset  ? Heart attack Mother   ? Heart attack Father   ? Heart attack Sister   ? Breast cancer Neg Hx   ? ? ?Review of Systems: ?All other systems reviewed and are otherwise negative except as noted above. ? ? ?Physical Exam: ?There were no vitals filed for this visit.  ? ?GEN- The patient is well appearing, alert and oriented x 3 today.   ?HEENT: normocephalic, atraumatic; sclera clear, conjunctiva pink; hearing intact; oropharynx clear; neck supple, no JVP ?Lymph- no cervical lymphadenopathy ?Lungs- Clear to ausculation bilaterally, normal work of breathing.  No wheezes, rales, rhonchi ?Heart- Regular rate and rhythm, no murmurs, rubs or gallops, PMI not laterally displaced ?GI- soft, non-tender, non-distended, bowel sounds present, no hepatosplenomegaly ?Extremities- no clubbing or cyanosis. No edema; DP/PT/radial pulses 2+ bilaterally ?MS- no significant deformity or atrophy ?Skin- warm and dry, no rash or lesion; ICD pocket well healed ?Psych- euthymic mood, full affect ?Neuro- strength and sensation are intact ? ?ICD interrogation- reviewed in detail today,  See PACEART report ? ?EKG:  EKG {ACTION; IS/IS SAY:[id number]}  ordered today. ?Personal review of EKG ordered {Blank single:19197::"today","***"} shows *** ? ?Recent Labs: ?12/14/2020: TSH 3.380 ?05/24/2021: ALT 14 ?07/12/2021: BUN 34; Creatinine, Ser 2.28; Potassium 5.1; Sodium 134 ?07/14/2021: Hemoglobin 10.0; Platelets 127  ? ?Wt Readings from Last 3 Encounters:  ?07/26/21 187 lb 12.8 oz (85.2 kg)  ?07/11/21 182 lb (82.6 kg)  ?07/01/21 186 lb 6 oz (84.5 kg)  ?  ? ?Other studies Reviewed: ?Additional studies/ records that were reviewed today include: Previous EP office notes.  ? ?Assessment and Plan: ? ?1.  Chronic systolic dysfunction s/p Boston Scientific CRT-D  ?euvolemic today ?Stable on an appropriate medical regimen ?Normal ICD function ?See Claudia Desanctis Art report ?No changes today ? ?2.  Renal cell carcinoma with brain metastases ?  Undergoing treatment for this. ?  ?3.  Permanent atrial fibrillation status post AV junction ablation ?Continue Coumadin for her CHA2DS2-VASc of at least 5 ?Rates *** ?  ?4.  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  ?5.  Coronary artery disease status post four-vessel bypass surgery ?Denies s/s ischemia ? ?Current medicines are reviewed at length with the patient today.   ? ?Labs/ tests ordered today include: *** ?No orders of the defined types were placed in this encounter. ? ? ? ?Disposition:   Follow up with {Blank single:19197::"Dr. Allred","Dr. Arlan Organ. Klein","Dr. Camnitz","Dr. Lambert","EP APP"} in {Blank single:19197::"2 weeks","4 weeks","3 months","6 months","12 months","as usual post gen change"}  ? ? ?Signed, ?Shirley Friar, PA-C  ?07/26/2021 ?11:58 AM ? ?[facility] ?[address] ?[address] ?[place] [address] ?[phone] (office) ?[phone] (fax)  ?

## 2021-07-26 NOTE — Progress Notes (Signed)
°Cardiology Office Note:   ° °Date:  07/26/2021  ° °ID:  Jacqueline Palmer, DOB 08/24/1945, MRN [id number] ° °PCP:  Johnson, Deborah B, MD  °Cardiologist:  Bridgette Christopher, MD ° °Referring MD: Johnson, Deborah B, MD  ° °CC: follow up ° °History of Present Illness:   ° °Jacqueline Palmer  (La-NEER) is a 75 y.o. female with a hx of hypertension, CAD s/p 4V CABG, MAZE, LAA ligation, bioprosthetic MVR (for rheumatic heart disease) 11/17/2015, permanent atrial fibrillation, chronic diastolic heart failure with recovered systolic dysfunction, hypothyroidism, CKD stage 4, metastatic renal cancer s/p radical nephrectomy and craniotomy for resection of brain tumor who is seen as a follow up today. I initially met her 02/19/20 new consult at the request of Johnson, Deborah B, MD for the evaluation and management of multiple cardiovascular conditions. ° °CV history: °-Previously followed by Dr. Hegland at [facility] for BiV-ICD. She is 100% BiV paced. Had one episode of sustained VT terminated by ATP. Has permanent atrial fibrillation, on coumadin. Appears she had AV node ablation 09/11/2016 at [facility] based on Care Everywhere. °-Admitted for HF at [facility] 10/2019, 11/2019. 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 Follows with Dr. Shadad in medical oncology and Dr. Vaslow in neuro oncology. Follows at [facility]. ° °Today: °She is doing well. Her R-hip arthroplasty surgery went well and she follows up tomorrow. She received a blood transfusion in the hospital during the procedure. However, she has been experiencing pain that is not currently well controlled. She was given Tramadol which improved the pain but she was unable to receive a refill. She was started on a different medication which was not as effective.  ° °She has started physical therapy and attends PT twice a day and home OT twice a week. She is currently not driving and has a difficult time moving her R leg. PT is helping her regain  her range of motion. She has a difficult time completing physical therapy because of her joint pain.  ° °She was told to reduce her torsemide. Since the reduction, she notices occasional bilateral swelling in her ankles.  ° °She had an episode of chest pain a few night ago which she relates to indigestion. After taking ginger ale and a ginger mint, she burped and the pain resolved. She does keep a nitroglycerin at her bedside for emergencies.  ° °Denies shortness of breath at rest or with normal exertion. No PND, orthopnea, or unexpected weight gain. No syncope or palpitations. ° °Past Medical History:  °Diagnosis Date  ° Acute kidney injury (HCC)   ° Arthritis   ° Atrial fibrillation (HCC)   ° Cancer (HCC)   ° 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  ° Cardiomyopathy (HCC)   ° CHF (congestive heart failure) (HCC)   ° Chronic a-fib (HCC) 08/14/2016  ° Chronic anticoagulation 08/14/2016  ° Coronary artery disease   ° 4v CABG 6/17  ° Graves disease   ° Hypertension   ° Hypothyroidism   ° 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  ° SN [id number] Model [id number] [From patient's surgical info card]  ° Presence of permanent cardiac pacemaker   ° Seizures (HCC)   ° Thrombus 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 myocardial infarction   ° Thyroid disease   ° ° °Past Surgical History:  °Procedure Laterality Date  ° ABDOMINAL HYSTERECTOMY    ° CORONARY ARTERY BYPASS GRAFT    ° CRANIOTOMY Right 07/30/2019  °   Right frontal  ° NEPHRECTOMY Right 10/16/2019  ° TOTAL HIP ARTHROPLASTY Right 07/11/2021  ° Procedure: RIGHT TOTAL HIP ARTHROPLASTY ANTERIOR APPROACH;  Surgeon: Rowan, Frank, MD;  Location: [facility];  Service: Orthopedics;  Laterality: Right;  ° ° °Current Medications: °Current Outpatient Medications on File Prior to Visit  °Medication Sig  ° acetaminophen (TYLENOL) 500 MG tablet Take 1,000 mg by mouth in the morning and at bedtime.  ° colchicine 0.6 MG tablet Take 1 tablet (0.6 mg total) by mouth at bedtime.  °  ENDOCET 5-325 MG tablet Take 1 tablet by mouth every 6 (six) hours as needed.  ° hydrALAZINE (APRESOLINE) 10 MG tablet TAKE ONE TABLET BY MOUTH THREE TIMES DAILY  ° levETIRAcetam (KEPPRA) 500 MG tablet Take 1 tablet (500 mg total) by mouth 2 (two) times daily.  ° levothyroxine (SYNTHROID) 25 MCG tablet TAKE ONE TABLET BY MOUTH ONE TIME DAILY 30 to 60 minutes before breakfast on an empty stomach with a full glass of water  ° metoprolol succinate (TOPROL-XL) 25 MG 24 hr tablet Take 1 tablet (25 mg total) by mouth daily.  ° nitroGLYCERIN (NITROSTAT) 0.4 MG SL tablet Place 1 tablet (0.4 mg total) under the tongue every 5 (five) minutes as needed for chest pain.  ° OVER THE COUNTER MEDICATION Apply 1 application topically 2 (two) times daily. Pain cream in groin area  ° polyethylene glycol (MIRALAX / GLYCOLAX) 17 g packet Take 17 g by mouth daily as needed for mild constipation.  ° pravastatin (PRAVACHOL) 20 MG tablet Take 20 mg by mouth at bedtime.  ° spironolactone (ALDACTONE) 25 MG tablet Take 1 tablet (25 mg total) by mouth daily.  ° tiZANidine (ZANAFLEX) 2 MG tablet Take 2 mg by mouth as needed.  ° torsemide (DEMADEX) 20 MG tablet Take 20 mg by mouth 2 (two) times daily.  ° warfarin (COUMADIN) 5 MG tablet TAKE ONE TABLET BY MOUTH DAILY IN THE EVENING  ° °No current facility-administered medications on file prior to visit.  °  ° °Allergies:   Avocado, Banana, Plantain, Ace inhibitors, Codeine, Prednisone, Tape, Latex, and Pharmabase cosmetic [aquamed]  ° °Social History  ° °Tobacco Use  ° Smoking status: Former  °  Types: Cigarettes  °  Quit date: 07/30/2014  °  Years since quitting: 6.9  ° Smokeless tobacco: Never  °Vaping Use  ° Vaping Use: Never used  °Substance Use Topics  ° Alcohol use: Yes  °  Comment: 1 glass wine twice weekly  ° Drug use: No  ° ° °Family History: °family history includes Heart attack in her father, mother, and sister. There is no history of Breast cancer. ° °ROS:   °Please see the history of  present illness.   °(+) Joint pain (R hip) °(+) LE edema (bilateral ankles) °(+) Chest pain  °(+) Indigestion °Additional pertinent ROS otherwise unremarkable.   ° °EKGs/Labs/Other Studies Reviewed:   ° °The following studies were reviewed today: °Echo 12/03/20 °1. Left ventricular ejection fraction, by estimation, is 60 to 65%. The  °left ventricle has normal function. The left ventricle has no regional  °wall motion abnormalities. Left ventricular diastolic function could not  °be evaluated.  ° 2. Right ventricular systolic function is normal. The right ventricular  °size is not well visualized. There is normal pulmonary artery systolic  °pressure. The estimated right ventricular systolic pressure is 25.8 mmHg.  ° 3. Left atrial size was moderately dilated.  ° 4. Right atrial size was mildly dilated.  ° 5. The   mitral valve has been repaired/replaced. No evidence of mitral  °valve regurgitation. The mean mitral valve gradient is 3.0 mmHg. There is  °a present in the mitral position.  ° 6. The aortic valve is tricuspid. Aortic valve regurgitation is trivial.  °No aortic stenosis is present.  ° 7. Aortic dilatation noted. There is mild dilatation of the ascending  °aorta, measuring 42 mm.  ° 8. The inferior vena cava is normal in size with greater than 50%  °respiratory variability, suggesting right atrial pressure of 3 mmHg.  ° °CIED follow up 02/10/20 ([facility]) °Stable BSC VVIR CRTD battery (5.5yrs) function and lead trends.  BIVpacing=100% (CHB; PM DEPENDENT).  Two NSVT episodes.  One sustained VT espiode (avg=205bpm) terminated with one burst of ATP.  Permanent AFib.  OAC=warfarin ° °Echo 11/03/19 ([facility]) °  NORMAL LEFT VENTRICULAR FUNCTION WITH MODERATE LVH  °  MILD RV SYSTOLIC DYSFUNCTION (See above)  °  VALVULAR REGURGITATION: MILD AR, TRIVIAL MR, MILD PR  °  PROSTHETIC VALVE(S): BIOPROSTHETIC MV  ° °  Compared with prior Echo study on 06/27/2019: TR NOT AS WELL VISUALIZED ON  °  TODAY'S EXAM DUE TO PATIENT  DISCOMFORT.  °  TR REVERSAL STILL NOTED IN THE HEPATIC VEINS, C/W SEVERE TR  ° °TEE 09/01/2016 ([facility]) °  Limited study to assess for LA/LAA thrombus pre-ablation/DCCV  ° °  1. Small amount of laminar thrombus in tip of LAA, smaller than prior TEE  °  (02/04/16)  °  2. Definity used demonstrates flow communication into LAA despite previous  °  ligation attempt and confirms thrombus in tip of LAA  °  3. No RA/RAA thrombus  °  4. Severe TR  ° ° °EKG:  EKG is personally reviewed.   °07/26/21: not ordered today °03/04/20 atypical flutter with biventricular pacing ° °Recent Labs: °12/14/2020: TSH 3.380 °05/24/2021: ALT 14 °07/12/2021: BUN 34; Creatinine, Ser 2.28; Potassium 5.1; Sodium 134 °07/14/2021: Hemoglobin 10.0; Platelets 127  °Recent Lipid Panel °No results found for: CHOL, TRIG, HDL, CHOLHDL, VLDL, LDLCALC, LDLDIRECT ° °Physical Exam:   ° °VS:  BP 102/68 (BP Location: Right Arm, Patient Position: Sitting, Cuff Size: Normal)    Pulse 70    Ht 5' 8" (1.727 m)    Wt 187 lb 12.8 oz (85.2 kg)    SpO2 99%    BMI 28.55 kg/m²    ° °Wt Readings from Last 3 Encounters:  °07/26/21 187 lb 12.8 oz (85.2 kg)  °07/11/21 182 lb (82.6 kg)  °07/01/21 186 lb 6 oz (84.5 kg)  °  °GEN: Well nourished, well developed in no acute distress °HEENT: Normal, moist mucous membranes °NECK: No JVD °CARDIAC: regular rhythm, normal S1 and S2, no rubs or gallops. No murmurs. °VASCULAR: Radial and DP pulses 2+ bilaterally. No carotid bruits °RESPIRATORY:  Clear to auscultation without rales, wheezing or rhonchi  °ABDOMEN: Soft, non-tender, non-distended °MUSCULOSKELETAL:  Ambulates independently but uses a walker °SKIN: Warm and dry, trivial bilateral LE edema primarily in the ankles °NEUROLOGIC:  Alert and oriented x 3. No focal neuro deficits noted. °PSYCHIATRIC:  Normal affect   ° °ASSESSMENT:   ° °1. Permanent atrial fibrillation (HCC)   °2. Chronic anticoagulation   °3. Chronic diastolic heart failure (HCC)   °4. Coronary artery disease involving  native coronary artery of native heart without angina pectoris   °5. Hx of CABG   °6. S/P MVR (mitral valve replacement)   °7. CKD (chronic kidney disease) stage 4, GFR 15-29 ml/min (HCC)   ° ° °  [place])     PLAN:    Chronic diastolic heart failure -reports no worsening clinical symptoms today -tolerating torsemide and spironolactone -Given renal disease and current GFR, will not use SGLT2i at this time  Permanent atrial fib/flutter, s/p AV node ablation Sustained VT requiring ATP -CHA2DS2/VAS Stroke Risk Points=5  -had LAA ligation but TEE at [facility] noted prior thrombus and residual flow -on coumadin, established with coumadin clinic here -has BiV-ICD in place, has established with Dr. Quentin Ore  CAD s/p 4V CABG Rheumatic MV disease s/p MVR Hypercholesterolemia, goal LDL <70 Thoracic aortic aneurysm, last 42 mm 11/2020 (echo) -rare angina, has not required NG in a long time -no aspirin as she is on coumadin -on pravastatin, tolerating -last lipids 04/11/19 at [facility], Thcol 128, LDL 64, HDL 49, TG 77 -on metoprolol succinate as antianginal -has mildly dilated TAA, 42 mm. Monitor.  CKD stage 4, s/p right radical nephrectomy  History of stage 4 renal cell carcinoma with brain metastases Hypertension -last GFR 19, consistent with stage 4 CKD -on spironolactone, torsemide for volume management -on hydralazine for BP control -if needed, could add imdur or change to carvedilol  -no ACEi/ARB given single kidney and chronic kidney disease   Cardiac risk counseling and prevention recommendations: -recommend heart healthy/Mediterranean diet, with whole grains, fruits, vegetable, fish, lean meats, nuts, and olive oil. Limit salt. -recommend moderate walking, 3-5 times/week for 30-50 minutes each session. Aim for at least 150 minutes.week. Goal should be pace of 3 miles/hours, or walking 1.5 miles in 30 minutes -recommend avoidance of tobacco products. Avoid excess alcohol.  Plan for follow up: 6 months or  sooner PRN  Buford Dresser, MD, PhD, [facility]   Medication Adjustments/Labs and Tests Ordered: Current medicines are reviewed at length with the patient today.  Concerns regarding medicines are outlined above.  No orders of the defined types were placed in this encounter.  No orders of the defined types were placed in this encounter.  Patient Instructions  Medication Instructions:  Your Physician recommend you continue on your current medication as directed.    *If you need a refill on your cardiac medications before your next appointment, please call your pharmacy*   Lab Work: None ordered today   Testing/Procedures: None ordered today   Follow-Up: At [facility], you and your health needs are our priority.  As part of our continuing mission to provide you with exceptional heart care, we have created designated Provider Care Teams.  These Care Teams include your primary Cardiologist (physician) and Advanced Practice Providers (APPs -  Physician Assistants and Nurse Practitioners) who all work together to provide you with the care you need, when you need it.  We recommend signing up for the patient portal called "MyChart".  Sign up information is provided on this After Visit Summary.  MyChart is used to connect with patients for Virtual Visits (Telemedicine).  Patients are able to view lab/test results, encounter notes, upcoming appointments, etc.  Non-urgent messages can be sent to your provider as well.   To learn more about what you can do with MyChart, go to [email or web address].    Your next appointment:   6 month(s)  The format for your next appointment:   In Person  Provider:   Buford Dresser, MD      Wilhemina Bonito as a scribe for Buford Dresser, MD.,have documented all relevant documentation on the behalf of Buford Dresser, MD,as directed by  Buford Dresser, MD while in the  MD. ° °I, Bridgette Christopher, MD, have reviewed all documentation for this visit. The documentation on 07/26/21 for the exam, diagnosis, procedures, and orders are all accurate and complete.  ° °Signed, °Bridgette Christopher, MD PhD °07/26/2021 12:40 PM    °[facility] °

## 2021-07-27 ENCOUNTER — Other Ambulatory Visit: Payer: Self-pay | Admitting: Internal Medicine

## 2021-07-27 DIAGNOSIS — E890 Postprocedural hypothyroidism: Secondary | ICD-10-CM

## 2021-07-27 NOTE — Telephone Encounter (Signed)
Requested Prescriptions  ?Pending Prescriptions Disp Refills  ?? levothyroxine (SYNTHROID) 25 MCG tablet [Pharmacy Med Name: Levothyroxine Sodium Oral Tablet 25 MCG] 90 tablet 0  ?  Sig: TAKE ONE TABLET BY MOUTH DAILY IN THE MORNING 30-60 MINUTES BEFORE BREAKFAST ON AN EMPTY STOMACH WITH A FULL GLASS OF WATER  ?  ? Endocrinology:  Hypothyroid Agents Passed - 07/27/2021  9:02 AM  ?  ?  Passed - TSH in normal range and within 360 days  ?  TSH  ?Date Value Ref Range Status  ?12/14/2020 3.380 0.450 - 4.500 uIU/mL Final  ?   ?  ?  Passed - Valid encounter within last 12 months  ?  Recent Outpatient Visits   ?      ? 3 months ago Essential hypertension  ? [place] Karle Plumber B, MD  ? 4 months ago COVID-19 virus infection  ? [place] Ladell Pier, MD  ? 5 months ago Encounter for [employer] annual wellness exam  ? [place] Ladell Pier, MD  ? 7 months ago Essential hypertension  ? [place] Ladell Pier, MD  ? 11 months ago Essential hypertension  ? [place] Ladell Pier, MD  ?  ?  ?Future Appointments   ?        ? In 3 weeks Ladell Pier, MD [place]  ?  ? ?  ?  ?  ? ?

## 2021-08-03 ENCOUNTER — Encounter: Payer: Medicare Other | Admitting: Student

## 2021-08-03 DIAGNOSIS — Z7901 Long term (current) use of anticoagulants: Secondary | ICD-10-CM

## 2021-08-03 DIAGNOSIS — I428 Other cardiomyopathies: Secondary | ICD-10-CM

## 2021-08-03 DIAGNOSIS — Z952 Presence of prosthetic heart valve: Secondary | ICD-10-CM

## 2021-08-03 DIAGNOSIS — I4821 Permanent atrial fibrillation: Secondary | ICD-10-CM

## 2021-08-08 ENCOUNTER — Other Ambulatory Visit: Payer: Self-pay | Admitting: Internal Medicine

## 2021-08-10 ENCOUNTER — Other Ambulatory Visit: Payer: Self-pay | Admitting: Internal Medicine

## 2021-08-10 NOTE — Telephone Encounter (Signed)
Requested medication (s) are due for refill today: historical medication ? ?Requested medication (s) are on the active medication list: yes ? ?Last refill:  na  ? ?Future visit scheduled: yes in 1 week ? ?Notes to clinic:  historical medication. Do you want to order Rx? ? ? ?  ?Requested Prescriptions  ?Pending Prescriptions Disp Refills  ? torsemide (DEMADEX) 20 MG tablet [Pharmacy Med Name: Torsemide Oral Tablet 20 MG] 90 tablet 0  ?  Sig: TAKE TWO TABLETS BY MOUTH IN THE MORNING AND ONE IN THE EVENING  ?  ? Cardiovascular:  Diuretics - Loop Failed - 08/08/2021  6:02 PM  ?  ?  Failed - Ca in normal range and within 180 days  ?  Calcium  ?Date Value Ref Range Status  ?07/12/2021 8.8 (L) 8.9 - 10.3 mg/dL Final  ?  ?  ?  ?  Failed - Na in normal range and within 180 days  ?  Sodium  ?Date Value Ref Range Status  ?07/12/2021 134 (L) 135 - 145 mmol/L Final  ?12/14/2020 141 134 - 144 mmol/L Final  ?  ?  ?  ?  Failed - Cr in normal range and within 180 days  ?  Creatinine  ?Date Value Ref Range Status  ?05/24/2021 2.93 (H) 0.44 - 1.00 mg/dL Final  ? ?Creatinine, Ser  ?Date Value Ref Range Status  ?07/12/2021 2.28 (H) 0.44 - 1.00 mg/dL Final  ?  ?  ?  ?  Failed - Mg Level in normal range and within 180 days  ?  No results found for: MG  ?  ?  ?  Passed - K in normal range and within 180 days  ?  Potassium  ?Date Value Ref Range Status  ?07/12/2021 5.1 3.5 - 5.1 mmol/L Final  ?  ?  ?  ?  Passed - Cl in normal range and within 180 days  ?  Chloride  ?Date Value Ref Range Status  ?07/12/2021 105 98 - 111 mmol/L Final  ?  ?  ?  ?  Passed - Last BP in normal range  ?  BP Readings from Last 1 Encounters:  ?07/26/21 102/68  ?  ?  ?  ?  Passed - Valid encounter within last 6 months  ?  Recent Outpatient Visits   ? ?      ? 3 months ago Essential hypertension  ? [place] Karle Plumber B, MD  ? 5 months ago COVID-19 virus infection  ? [place] Ladell Pier,  MD  ? 6 months ago Encounter for [employer] annual wellness exam  ? [place] Ladell Pier, MD  ? 7 months ago Essential hypertension  ? [place] Ladell Pier, MD  ? 11 months ago Essential hypertension  ? [place] Ladell Pier, MD  ? ?  ?  ?Future Appointments   ? ?        ? In 1 week Ladell Pier, MD [place]  ? ?  ? ?  ?  ?  ?Signed Prescriptions Disp Refills  ? spironolactone (ALDACTONE) 25 MG tablet 90 tablet 0  ?  Sig: TAKE ONE TABLET BY MOUTH ONE TIME DAILY  ?  ? Cardiovascular: Diuretics - Aldosterone Antagonist Failed - 08/08/2021  6:02 PM  ?  ?  Failed - Cr in  normal range and within 180 days  ?  Creatinine  ?Date Value Ref Range Status  ?05/24/2021 2.93 (H) 0.44 - 1.00 mg/dL Final  ? ?Creatinine, Ser  ?Date Value Ref Range Status  ?07/12/2021 2.28 (H) 0.44 - 1.00 mg/dL Final  ?  ?  ?  ?  Failed - Na in normal range and within 180 days  ?  Sodium  ?Date Value Ref Range Status  ?07/12/2021 134 (L) 135 - 145 mmol/L Final  ?12/14/2020 141 134 - 144 mmol/L Final  ?  ?  ?  ?  Failed - eGFR is 30 or above and within 180 days  ?  GFR, Est AFR Am  ?Date Value Ref Range Status  ?01/20/2020 30 (L) >60 mL/min Final  ? ?GFR, Estimated  ?Date Value Ref Range Status  ?07/12/2021 22 (L) >60 mL/min Final  ?  Comment:  ?  (NOTE) ?Calculated using the CKD-EPI Creatinine Equation (2021) ?  ?05/24/2021 16 (L) >60 mL/min Final  ?  Comment:  ?  (NOTE) ?Calculated using the CKD-EPI Creatinine Equation (2021) ?  ? ?eGFR  ?Date Value Ref Range Status  ?12/14/2020 19 (L) >59 mL/min/1.73 Final  ?  ?  ?  ?  Passed - K in normal range and within 180 days  ?  Potassium  ?Date Value Ref Range Status  ?07/12/2021 5.1 3.5 - 5.1 mmol/L Final  ?  ?  ?  ?  Passed - Last BP in normal range  ?  BP Readings from Last 1 Encounters:  ?07/26/21 102/68  ?  ?  ?  ?  Passed - Valid encounter within  last 6 months  ?  Recent Outpatient Visits   ? ?      ? 3 months ago Essential hypertension  ? [place] Karle Plumber B, MD  ? 5 months ago COVID-19 virus infection  ? [place] Ladell Pier, MD  ? 6 months ago Encounter for [employer] annual wellness exam  ? [place] Ladell Pier, MD  ? 7 months ago Essential hypertension  ? [place] Ladell Pier, MD  ? 11 months ago Essential hypertension  ? [place] Ladell Pier, MD  ? ?  ?  ?Future Appointments   ? ?        ? In 1 week Ladell Pier, MD [place]  ? ?  ? ?  ?  ?  ? ?

## 2021-08-10 NOTE — Telephone Encounter (Signed)
Requested Prescriptions  ?Pending Prescriptions Disp Refills  ?? torsemide (DEMADEX) 20 MG tablet [Pharmacy Med Name: Torsemide Oral Tablet 20 MG] 90 tablet 0  ?  Sig: TAKE TWO TABLETS BY MOUTH IN THE MORNING AND ONE IN THE EVENING  ?  ? Cardiovascular:  Diuretics - Loop Failed - 08/08/2021  6:02 PM  ?  ?  Failed - Ca in normal range and within 180 days  ?  Calcium  ?Date Value Ref Range Status  ?07/12/2021 8.8 (L) 8.9 - 10.3 mg/dL Final  ?   ?  ?  Failed - Na in normal range and within 180 days  ?  Sodium  ?Date Value Ref Range Status  ?07/12/2021 134 (L) 135 - 145 mmol/L Final  ?12/14/2020 141 134 - 144 mmol/L Final  ?   ?  ?  Failed - Cr in normal range and within 180 days  ?  Creatinine  ?Date Value Ref Range Status  ?05/24/2021 2.93 (H) 0.44 - 1.00 mg/dL Final  ? ?Creatinine, Ser  ?Date Value Ref Range Status  ?07/12/2021 2.28 (H) 0.44 - 1.00 mg/dL Final  ?   ?  ?  Failed - Mg Level in normal range and within 180 days  ?  No results found for: MG   ?  ?  Passed - K in normal range and within 180 days  ?  Potassium  ?Date Value Ref Range Status  ?07/12/2021 5.1 3.5 - 5.1 mmol/L Final  ?   ?  ?  Passed - Cl in normal range and within 180 days  ?  Chloride  ?Date Value Ref Range Status  ?07/12/2021 105 98 - 111 mmol/L Final  ?   ?  ?  Passed - Last BP in normal range  ?  BP Readings from Last 1 Encounters:  ?07/26/21 102/68  ?   ?  ?  Passed - Valid encounter within last 6 months  ?  Recent Outpatient Visits   ?      ? 3 months ago Essential hypertension  ? [facility] Johnson, Deborah B, MD  ? 5 months ago COVID-19 virus infection  ? [place] [facility] Johnson, Deborah B, MD  ? 6 months ago Encounter for Medicare annual wellness exam  ? [place] [facility] Johnson, Deborah B, MD  ? 7 months ago Essential hypertension  ? [place] [facility] Johnson, Deborah B, MD  ? 11 months ago Essential hypertension  ? [place]  [facility] Johnson, Deborah B, MD  ?  ?  ?Future Appointments   ?        ? In 1 week Johnson, Deborah B, MD [place] [facility]  ?  ? ?  ?  ?  ?? spironolactone (ALDACTONE) 25 MG tablet [Pharmacy Med Name: Spironolactone Oral Tablet 25 MG] 90 tablet 0  ?  Sig: TAKE ONE TABLET BY MOUTH ONE TIME DAILY  ?  ? Cardiovascular: Diuretics - Aldosterone Antagonist Failed - 08/08/2021  6:02 PM  ?  ?  Failed - Cr in normal range and within 180 days  ?  Creatinine  ?Date Value Ref Range Status  ?05/24/2021 2.93 (H) 0.44 - 1.00 mg/dL Final  ? ?Creatinine, Ser  ?Date Value Ref Range Status  ?07/12/2021 2.28 (H) 0.44 - 1.00 mg/dL Final  ?   ?  ?  Failed - Na in normal range and within   180 days  ?  Sodium  ?Date Value Ref Range Status  ?07/12/2021 134 (L) 135 - 145 mmol/L Final  ?12/14/2020 141 134 - 144 mmol/L Final  ?   ?  ?  Failed - eGFR is 30 or above and within 180 days  ?  GFR, Est AFR Am  ?Date Value Ref Range Status  ?01/20/2020 30 (L) >60 mL/min Final  ? ?GFR, Estimated  ?Date Value Ref Range Status  ?07/12/2021 22 (L) >60 mL/min Final  ?  Comment:  ?  (NOTE) ?Calculated using the CKD-EPI Creatinine Equation (2021) ?  ?05/24/2021 16 (L) >60 mL/min Final  ?  Comment:  ?  (NOTE) ?Calculated using the CKD-EPI Creatinine Equation (2021) ?  ? ?eGFR  ?Date Value Ref Range Status  ?12/14/2020 19 (L) >59 mL/min/1.73 Final  ?   ?  ?  Passed - K in normal range and within 180 days  ?  Potassium  ?Date Value Ref Range Status  ?07/12/2021 5.1 3.5 - 5.1 mmol/L Final  ?   ?  ?  Passed - Last BP in normal range  ?  BP Readings from Last 1 Encounters:  ?07/26/21 102/68  ?   ?  ?  Passed - Valid encounter within last 6 months  ?  Recent Outpatient Visits   ?      ? 3 months ago Essential hypertension  ? [place] [facility] Johnson, Deborah B, MD  ? 5 months ago COVID-19 virus infection  ? [place] [facility] Johnson, Deborah B, MD  ? 6 months ago Encounter  for Medicare annual wellness exam  ? [place] [facility] Johnson, Deborah B, MD  ? 7 months ago Essential hypertension  ? [place] [facility] Johnson, Deborah B, MD  ? 11 months ago Essential hypertension  ? [place] [facility] Johnson, Deborah B, MD  ?  ?  ?Future Appointments   ?        ? In 1 week Johnson, Deborah B, MD [place] [facility]  ?  ? ?  ?  ?  ? ?

## 2021-08-10 NOTE — Telephone Encounter (Signed)
Copied from [place] [id number]. Topic: Quick Communication - Rx Refill/Question ?>> Aug 10, 2021  9:29 AM Leward Quan A wrote: ?Medication: torsemide (DEMADEX) 20 MG tablet ? ?Has the patient contacted their pharmacy? No. Never fill by Dr Wynetta Emery before ?(Agent: If no, request that the patient contact the pharmacy for the refill. If patient does not wish to contact the pharmacy document the reason why and proceed with request.) ?(Agent: If yes, when and what did the pharmacy advise?) ? ?Preferred Pharmacy (with phone number or street name): [facility] # [place], [place]  ?Phone:  [phone] ?Fax:  [phone] ? ? ? ?Has the patient been seen for an appointment in the last year OR does the patient have an upcoming appointment? Yes.   ? ?Agent: Please be advised that RX refills may take up to 3 business days. We ask that you follow-up with your pharmacy. ?

## 2021-08-11 ENCOUNTER — Ambulatory Visit: Payer: Self-pay | Admitting: *Deleted

## 2021-08-11 NOTE — Telephone Encounter (Signed)
Patient called and advised Torsemide was refused on 08/10/20 due to need to call cardiologist for refill. Patient verbalized understanding and says she should've been told that when she talked to someone earlier today. She says she will call the cardiologist. ?

## 2021-08-11 NOTE — Telephone Encounter (Signed)
Summary: ankles swelling  ? Pt is calling back and requesting a follow up on the medication refill of torsemide (DEMADEX) 20 MG tablet. I advised pt of Reason for Refusal Comment:  Please request pt's Cardiologist.  ?Pt stated that she always requests refills from PCP and has never had any problems.  ? ?Pt stated she has been out of the medication for a week and takes this medication three times a day.  ?Pt stated both of her ankles have started to swell.  ? ? ?Seeking clinical advice.  ? ?Sending this as clinical due to pt symptoms   ?  ? ? ? ?Chief Complaint: bilateral ankle swelling and requesting medication ?Symptoms: bilateral ankle swelling. Ran out of torsemide medication 1 week ago and can not get refill.  ?Frequency: x 1 week  ?Pertinent Negatives: Patient denies chest pain, difficulty breathing. ?Disposition: '[]'$ ED /'[]'$ Urgent Care (no appt availability in office) / '[x]'$ Appointment(In office/virtual)/ '[]'$  [facility]/ '[]'$ Home Care/ '[]'$ Refused Recommended Disposition /'[]'$ [facility]/ '[]'$  Follow-up with PCP ?Additional Notes:  ? ?Reports she is requesting torsemide 20 mg due to she understands PCP is to refill all of her medications now. Recommended to call cardiologist for refill. Patient reports she has tried to get refill since last week before running out of medication and unaware PCP would not refill. Appt scheduled for 08/18/21. Please contact patient today if refill or renewal of torsemide will be given.  ?  ? ? ? ? ?Reason for Disposition ? [1] MODERATE pain (e.g., interferes with normal activities, limping) AND [2] present > 3 days ? ?Answer Assessment - Initial Assessment Questions ?1. LOCATION: "Which ankle is swollen?" "Where is the swelling?" ?    Both  ?2. ONSET: "When did the swelling start?" ?    1 week ago after running out of torsemide ?3. SIZE: "How large is the swelling?" ?    na ?4. PAIN: "Is there any pain?" If Yes, ask: "How bad is it?" (Scale 1-10; or mild, moderate,  severe) ?  - NONE (0): no pain. ?  - MILD (1-3): doesn't interfere with normal activities.  ?  - MODERATE (4-7): interferes with normal activities (e.g., work or school) or awakens from sleep, limping.  ?  - SEVERE (8-10): excruciating pain, unable to do any normal activities, unable to walk.  ?    na ?5. CAUSE: "What do you think caused the ankle swelling?" ?    Ran out of medication ?6. OTHER SYMPTOMS: "Do you have any other symptoms?" (e.g., fever, chest pain, difficulty breathing, calf pain) ?    Denies  ?7. PREGNANCY: "Is there any chance you are pregnant?" "When was your last menstrual period?" ?    na ? ?Protocols used: Ankle Swelling-A-AH ? ?

## 2021-08-11 NOTE — Telephone Encounter (Signed)
Summary: ankles swelling  ? Pt is calling back and requesting a follow up on the medication refill of torsemide (DEMADEX) 20 MG tablet. I advised pt of Reason for Refusal Comment:  Please request pt's Cardiologist.  ?Pt stated that she always requests refills from PCP and has never had any problems.  ? ?Pt stated she has been out of the medication for a week and takes this medication three times a day.  ?Pt stated both of her ankles have started to swell.  ? ? ?Seeking clinical advice.  ? ?Sending this as clinical due to pt symptoms   ?  ? ?Called patient to review sx ankle swelling and medication requests. No answer x 60 sec. Unable to leave message.  ?

## 2021-08-12 MED ORDER — TORSEMIDE 20 MG PO TABS: ORAL_TABLET | ORAL | 6 refills | Status: DC

## 2021-08-12 NOTE — Addendum Note (Signed)
Addended by: Karle Plumber B on: 08/12/2021 06:30 AM ? ? Modules accepted: Orders ? ?

## 2021-08-12 NOTE — Telephone Encounter (Signed)
Contacted pt and made aware of provider message. Pt states she understands and doesn't have any questions or concerns  ?

## 2021-08-18 ENCOUNTER — Ambulatory Visit: Payer: Medicare Other | Attending: Internal Medicine | Admitting: Internal Medicine

## 2021-08-18 ENCOUNTER — Encounter: Payer: Self-pay | Admitting: Internal Medicine

## 2021-08-18 ENCOUNTER — Ambulatory Visit (INDEPENDENT_AMBULATORY_CARE_PROVIDER_SITE_OTHER): Payer: Medicare Other

## 2021-08-18 VITALS — BP 138/74 | HR 70 | Resp 16 | Wt 180.0 lb

## 2021-08-18 DIAGNOSIS — N184 Chronic kidney disease, stage 4 (severe): Secondary | ICD-10-CM | POA: Diagnosis not present

## 2021-08-18 DIAGNOSIS — D509 Iron deficiency anemia, unspecified: Secondary | ICD-10-CM | POA: Diagnosis not present

## 2021-08-18 DIAGNOSIS — I482 Chronic atrial fibrillation, unspecified: Secondary | ICD-10-CM | POA: Diagnosis not present

## 2021-08-18 DIAGNOSIS — I5032 Chronic diastolic (congestive) heart failure: Secondary | ICD-10-CM | POA: Diagnosis not present

## 2021-08-18 DIAGNOSIS — Z5181 Encounter for therapeutic drug level monitoring: Secondary | ICD-10-CM

## 2021-08-18 DIAGNOSIS — D631 Anemia in chronic kidney disease: Secondary | ICD-10-CM | POA: Insufficient documentation

## 2021-08-18 DIAGNOSIS — E890 Postprocedural hypothyroidism: Secondary | ICD-10-CM | POA: Diagnosis not present

## 2021-08-18 DIAGNOSIS — I130 Hypertensive heart and chronic kidney disease with heart failure and stage 1 through stage 4 chronic kidney disease, or unspecified chronic kidney disease: Secondary | ICD-10-CM | POA: Insufficient documentation

## 2021-08-18 DIAGNOSIS — I2510 Atherosclerotic heart disease of native coronary artery without angina pectoris: Secondary | ICD-10-CM | POA: Insufficient documentation

## 2021-08-18 DIAGNOSIS — N2581 Secondary hyperparathyroidism of renal origin: Secondary | ICD-10-CM | POA: Diagnosis not present

## 2021-08-18 DIAGNOSIS — I4821 Permanent atrial fibrillation: Secondary | ICD-10-CM | POA: Insufficient documentation

## 2021-08-18 DIAGNOSIS — Z7901 Long term (current) use of anticoagulants: Secondary | ICD-10-CM | POA: Diagnosis not present

## 2021-08-18 DIAGNOSIS — K30 Functional dyspepsia: Secondary | ICD-10-CM | POA: Insufficient documentation

## 2021-08-18 DIAGNOSIS — Z96641 Presence of right artificial hip joint: Secondary | ICD-10-CM | POA: Diagnosis not present

## 2021-08-18 DIAGNOSIS — I10 Essential (primary) hypertension: Secondary | ICD-10-CM | POA: Diagnosis not present

## 2021-08-18 DIAGNOSIS — I5042 Chronic combined systolic (congestive) and diastolic (congestive) heart failure: Secondary | ICD-10-CM | POA: Diagnosis present

## 2021-08-18 DIAGNOSIS — Z79899 Other long term (current) drug therapy: Secondary | ICD-10-CM | POA: Diagnosis not present

## 2021-08-18 LAB — POCT INR: INR: 4.3 — AB (ref 2.0–3.0)

## 2021-08-18 NOTE — Patient Instructions (Addendum)
HOLD TONIGHT ONLY and then  ?Continue taking warfarin 1 tablet daily except for 1.5 tablets on Fridays. Recheck INR in 2 weeks. [facility] [phone].  Patient can't come next week. ?

## 2021-08-18 NOTE — Progress Notes (Signed)
? ? ?Patient ID: Jacqueline Palmer, female    DOB: [birth year]  MRN: [id number] ? ?CC: chronic ds management ? ?Subjective: ?[name] is a 76 y.o. female who presents for chronic ds management ?Her concerns today include:  ?Patient with history of CAD with history CABGx4, chronic diastolic CHF/NICM with BV-ICD, HTN, rheumatic heart disease status post MVR replacement, permanent A. 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   ? ?Since last visit with me, patient has had total right hip replacement 06/2021.  She is doing well with P.T.   ?Ambulating with a walker and hopes to transition to cane ? ?HTN/CAD/atrial fibrillation: She denies any chest pains or shortness of breath.  She gets some indigestion at times for which she drinks ginger ale which causes her to burp and feel better.  No lower extremity edema.  No palpitations.  She is still on Coumadin and levels are being followed in anticoagulation clinic.  She had called last week to report increased swelling in the legs after being out of torsemide for several days.  I sent refills.  She reports that the swelling has gone down.  She saw Dr. Harrell Gave her cardiologist earlier this month. ?Reports compliance with her medications including hydralazine, spironolactone, torsemide, warfarin, pravastatin ?-She tries to limit salt in the food is much as possible.  In some ways she is at the mercy of the cooks at her independent living facility. ? ?Hypothyroidism: Reports compliance with taking levothyroxine.  Due for thyroid level check. ? ?CKD 4: Continues to be followed by nephrology Dr. Candiss Norse whom she last saw 05/2021.  She has anemia associated with chronic kidney disease and secondary hyperparathyroidism.  Last PTH level was 138.  Last H/H hip replacement surgery was 10/29 with MCV of 78.  Prior to that,  hemoglobin was 12.5-11.6. ?Patient Active Problem List  ? Diagnosis Date Noted  ? Avascular necrosis of bone of right hip ([place]) 07/11/2021  ? Advance directive in chart 04/24/2021  ? Ascending aortic aneurysm 04/24/2021  ? Gait disturbance 04/19/2021  ? Acute-on-chronic kidney injury ([place]) 12/03/2020  ? Subtherapeutic international normalized ratio (INR) 12/03/2020  ? Chest pain 12/02/2020  ? Neuropathic pain 11/25/2020  ? Seizures ([place]) 06/16/2020  ? Over weight 05/03/2020  ? Nonischemic cardiomyopathy ([place]) 03/04/2020  ? Biventricular automatic implantable cardioverter defibrillator in situ 02/19/2020  ? Chronic diastolic heart failure ([place]) 02/19/2020  ? Hx of CABG 02/19/2020  ? Coronary artery disease involving native coronary artery of native heart without angina pectoris 02/19/2020  ? Essential hypertension 02/19/2020  ? Permanent atrial fibrillation ([place]) 02/19/2020  ? CKD (chronic kidney disease) stage 4, GFR 15-29 ml/min ([facility]) 02/19/2020  ? Renal cell carcinoma of right kidney ([place]) 02/12/2020  ? Brain metastasis ([place]) 02/12/2020  ? Gout attack 08/14/2016  ? Chronic a-fib ([place]) 08/14/2016  ? Chronic anticoagulation 08/14/2016  ? Acute on chronic systolic heart failure ([place]) 08/09/2016  ? 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  ?  ? ?Current Outpatient Medications on File Prior to Visit  ?Medication Sig Dispense Refill  ? acetaminophen (TYLENOL) 500 MG tablet Take 1,000 mg by mouth in the morning and at bedtime.    ? colchicine 0.6 MG tablet Take 1 tablet (0.6 mg total) by mouth at bedtime. 90 tablet 3  ? hydrALAZINE (APRESOLINE) 10 MG tablet TAKE ONE TABLET BY MOUTH THREE TIMES DAILY 270  tablet 2  ? levETIRAcetam (KEPPRA) 500 MG tablet Take 1 tablet (500 mg total) by mouth 2 (two) times daily. 180 tablet 3  ? levothyroxine (SYNTHROID) 25 MCG tablet TAKE ONE TABLET BY MOUTH DAILY IN THE MORNING 30-60 MINUTES BEFORE BREAKFAST ON AN EMPTY STOMACH WITH A FULL GLASS OF WATER 90 tablet 0  ? metoprolol succinate  (TOPROL-XL) 25 MG 24 hr tablet Take 1 tablet (25 mg total) by mouth daily. 90 tablet 3  ? nitroGLYCERIN (NITROSTAT) 0.4 MG SL tablet Place 1 tablet (0.4 mg total) under the tongue every 5 (five) minutes as needed for chest pain. 25 tablet 3  ? OVER THE COUNTER MEDICATION Apply 1 application topically 2 (two) times daily. Pain cream in groin area    ? polyethylene glycol (MIRALAX / GLYCOLAX) 17 g packet Take 17 g by mouth daily as needed for mild constipation.    ? pravastatin (PRAVACHOL) 20 MG tablet Take 20 mg by mouth at bedtime.    ? spironolactone (ALDACTONE) 25 MG tablet TAKE ONE TABLET BY MOUTH ONE TIME DAILY 90 tablet 0  ? torsemide (DEMADEX) 20 MG tablet Take 2 tabs PO Q a.m and one tab PO Q p.m 90 tablet 6  ? warfarin (COUMADIN) 5 MG tablet TAKE ONE TABLET BY MOUTH DAILY IN THE EVENING 90 tablet 0  ? ?No current facility-administered medications on file prior to visit.  ? ? ?Allergies  ?Allergen Reactions  ? Avocado Shortness Of Breath  ?  Before 2007  ? Banana Anaphylaxis and Swelling  ? Plantain Anaphylaxis and Swelling  ? Ace Inhibitors Cough  ? Codeine Other (See Comments)  ?  Nausea and Feels Jittery  ? Prednisone Swelling  ?  Of face and lower extremities  ? Tape Hives  ?  Tape  ?Does better with paper tape   ? Latex Rash  ? Pharmabase Cosmetic [[place]] Swelling and Rash  ?  Some Soap, perfumes,detergent  ? ? ?Social History  ? ?Socioeconomic History  ? Marital status: Widowed  ?  Spouse name: Not on file  ? Number of children: 0  ? Years of education: Doctorate  ? Highest education level: Master's degree (e.g., MA, MS, MEng, MEd, MSW, MBA)  ?Occupational History  ? Occupation: Retired Clinical biochemist  ?Tobacco Use  ? Smoking status: Former  ?  Types: Cigarettes  ?  Quit date: 07/30/2014  ?  Years since quitting: 7.0  ? Smokeless tobacco: Never  ?Vaping Use  ? Vaping Use: Never used  ?Substance and Sexual Activity  ? Alcohol use: Yes  ?  Comment: 1 glass wine twice weekly  ? Drug use: No  ? Sexual activity:  Not on file  ?Other Topics Concern  ? Not on file  ?Social History Narrative  ? Right handed  ? Coffee daily/tea qod  ? ?Social Determinants of Health  ? ?Financial Resource Strain: Not on file  ?Food Insecurity: Not on file  ?Transportation Needs: Not on file  ?Physical Activity: Not on file  ?Stress: Not on file  ?Social Connections: Not on file  ?Intimate Partner Violence: Not on file  ? ? ?Family History  ?Problem Relation Age of Onset  ? Heart attack Mother   ? Heart attack Father   ? Heart attack Sister   ? Breast cancer Neg Hx   ? ? ?Past Surgical History:  ?Procedure Laterality Date  ? ABDOMINAL HYSTERECTOMY    ? CORONARY ARTERY BYPASS GRAFT    ? CRANIOTOMY Right 07/30/2019  ? Right  frontal  ? NEPHRECTOMY Right 10/16/2019  ? TOTAL HIP ARTHROPLASTY Right 07/11/2021  ? Procedure: RIGHT TOTAL HIP ARTHROPLASTY ANTERIOR APPROACH;  Surgeon: Frederik Pear, MD;  Location: [facility];  Service: Orthopedics;  Laterality: Right;  ? ? ?ROS: ?Review of Systems ?Negative except as stated above ? ?PHYSICAL EXAM: ?BP 138/74   Pulse 70   Resp 16   Wt 180 lb (81.6 kg)   SpO2 98%   BMI 27.37 kg/m?   ?Physical Exam ? ?General appearance - alert, well appearing, pleasant elderly African-American female and in no distress ?Mental status - normal mood, behavior, speech, dress, motor activity, and thought processes ?Neck - supple, no significant adenopathy ?Chest - clear to auscultation, no wheezes, rales or rhonchi, symmetric air entry ?Heart - normal rate, regular rhythm, normal S1, S2, no murmurs, rubs, clicks or gallops ?Extremities - no LE edema ?MSK: Patient ambulates with rollator walker ? ? ? ?  Latest Ref Rng & Units 07/12/2021  ?  3:28 AM 07/11/2021  ?  3:59 PM 07/01/2021  ? 12:49 PM  ?CMP  ?Glucose 70 - 99 mg/dL 127    75    ?BUN 8 - 23 mg/dL 34    48    ?Creatinine 0.44 - 1.00 mg/dL 2.28   2.35   2.93    ?Sodium 135 - 145 mmol/L 134    137    ?Potassium 3.5 - 5.1 mmol/L 5.1    4.0    ?Chloride 98 - 111 mmol/L 105    104     ?CO2 22 - 32 mmol/L 22    24    ?Calcium 8.9 - 10.3 mg/dL 8.8    9.4    ? ?Lipid Panel  ?No results found for: CHOL, TRIG, HDL, CHOLHDL, VLDL, LDLCALC, LDLDIRECT ? ?CBC ?   ?Component Value Date/Time  ? WBC 9.9 07/14/18

## 2021-08-19 LAB — CBC
Hematocrit: 39.5 % (ref 34.0–46.6)
Hemoglobin: 12.4 g/dL (ref 11.1–15.9)
MCH: 25.7 pg — ABNORMAL LOW (ref 26.6–33.0)
MCHC: 31.4 g/dL — ABNORMAL LOW (ref 31.5–35.7)
MCV: 82 fL (ref 79–97)
Platelets: 292 10*3/uL (ref 150–450)
RBC: 4.82 x10E6/uL (ref 3.77–5.28)
RDW: 16 % — ABNORMAL HIGH (ref 11.7–15.4)
WBC: 7.1 10*3/uL (ref 3.4–10.8)

## 2021-08-19 LAB — TSH: TSH: 2.34 u[IU]/mL (ref 0.450–4.500)

## 2021-08-23 ENCOUNTER — Ambulatory Visit (HOSPITAL_COMMUNITY)
Admission: RE | Admit: 2021-08-23 | Discharge: 2021-08-23 | Disposition: A | Discharge status: Home / Self Care | Payer: Medicare Other | Source: Ambulatory Visit | Attending: Oncology | Admitting: Oncology

## 2021-08-23 ENCOUNTER — Inpatient Hospital Stay: Payer: Medicare Other | Attending: Oncology

## 2021-08-23 ENCOUNTER — Other Ambulatory Visit: Payer: Self-pay

## 2021-08-23 DIAGNOSIS — N2889 Other specified disorders of kidney and ureter: Secondary | ICD-10-CM | POA: Diagnosis present

## 2021-08-23 DIAGNOSIS — D649 Anemia, unspecified: Secondary | ICD-10-CM | POA: Insufficient documentation

## 2021-08-23 DIAGNOSIS — Z90710 Acquired absence of both cervix and uterus: Secondary | ICD-10-CM | POA: Insufficient documentation

## 2021-08-23 DIAGNOSIS — C7931 Secondary malignant neoplasm of brain: Secondary | ICD-10-CM | POA: Insufficient documentation

## 2021-08-23 DIAGNOSIS — C649 Malignant neoplasm of unspecified kidney, except renal pelvis: Secondary | ICD-10-CM | POA: Diagnosis present

## 2021-08-23 DIAGNOSIS — N189 Chronic kidney disease, unspecified: Secondary | ICD-10-CM | POA: Insufficient documentation

## 2021-08-23 DIAGNOSIS — R918 Other nonspecific abnormal finding of lung field: Secondary | ICD-10-CM | POA: Diagnosis present

## 2021-08-23 DIAGNOSIS — Z87891 Personal history of nicotine dependence: Secondary | ICD-10-CM | POA: Insufficient documentation

## 2021-08-23 DIAGNOSIS — I130 Hypertensive heart and chronic kidney disease with heart failure and stage 1 through stage 4 chronic kidney disease, or unspecified chronic kidney disease: Secondary | ICD-10-CM | POA: Insufficient documentation

## 2021-08-23 LAB — CBC WITH DIFFERENTIAL (CANCER CENTER ONLY)
Abs Immature Granulocytes: 0.01 10*3/uL (ref 0.00–0.07)
Basophils Absolute: 0 10*3/uL (ref 0.0–0.1)
Basophils Relative: 1 %
Eosinophils Absolute: 0.2 10*3/uL (ref 0.0–0.5)
Eosinophils Relative: 3 %
HCT: 36.4 % (ref 36.0–46.0)
Hemoglobin: 11.8 g/dL — ABNORMAL LOW (ref 12.0–15.0)
Immature Granulocytes: 0 %
Lymphocytes Relative: 34 %
Lymphs Abs: 2.1 10*3/uL (ref 0.7–4.0)
MCH: 25.6 pg — ABNORMAL LOW (ref 26.0–34.0)
MCHC: 32.4 g/dL (ref 30.0–36.0)
MCV: 79 fL — ABNORMAL LOW (ref 80.0–100.0)
Monocytes Absolute: 0.9 10*3/uL (ref 0.1–1.0)
Monocytes Relative: 15 %
Neutro Abs: 3 10*3/uL (ref 1.7–7.7)
Neutrophils Relative %: 47 %
Platelet Count: 262 10*3/uL (ref 150–400)
RBC: 4.61 MIL/uL (ref 3.87–5.11)
RDW: 15.9 % — ABNORMAL HIGH (ref 11.5–15.5)
WBC Count: 6.3 10*3/uL (ref 4.0–10.5)
nRBC: 0 % (ref 0.0–0.2)

## 2021-08-23 LAB — CMP (CANCER CENTER ONLY)
ALT: 11 U/L (ref 0–44)
AST: 17 U/L (ref 15–41)
Albumin: 4.5 g/dL (ref 3.5–5.0)
Alkaline Phosphatase: 102 U/L (ref 38–126)
Anion gap: 9 (ref 5–15)
BUN: 44 mg/dL — ABNORMAL HIGH (ref 8–23)
CO2: 28 mmol/L (ref 22–32)
Calcium: 10.1 mg/dL (ref 8.9–10.3)
Chloride: 100 mmol/L (ref 98–111)
Creatinine: 2.58 mg/dL — ABNORMAL HIGH (ref 0.44–1.00)
GFR, Estimated: 19 mL/min — ABNORMAL LOW (ref >60)
Glucose, Bld: 80 mg/dL (ref 70–99)
Potassium: 4.4 mmol/L (ref 3.5–5.1)
Sodium: 137 mmol/L (ref 135–145)
Total Bilirubin: 0.7 mg/dL (ref 0.3–1.2)
Total Protein: 8 g/dL (ref 6.5–8.1)

## 2021-08-23 IMAGING — CT CT CHEST W/O CM
2 of 4 series · 13 of 36 positions shown, 16 images · non-contrast
Comparison: Multiple priors including most recent CT [DATE]

CLINICAL DATA: History of renal cell carcinoma status post right
nephrectomy. Follow-up.

* Tracking Code: BO *
EXAM:
CT CHEST, ABDOMEN AND PELVIS WITHOUT CONTRAST
TECHNIQUE: Multidetector CT imaging of the chest, abdomen and pelvis was
performed following the standard protocol without IV contrast.
RADIATION DOSE REDUCTION: This exam was performed according to the
departmental dose-optimization program which includes automated
exposure control, adjustment of the mA and/or kV according to
patient size and/or use of iterative reconstruction technique.

[Series 2: cap w/o · axial · non-contrast · 0.79mm/px · z∈[-468,+27]mm · 10 of 123 slices shown, 13 images]
[im 12/123  mediastinal]
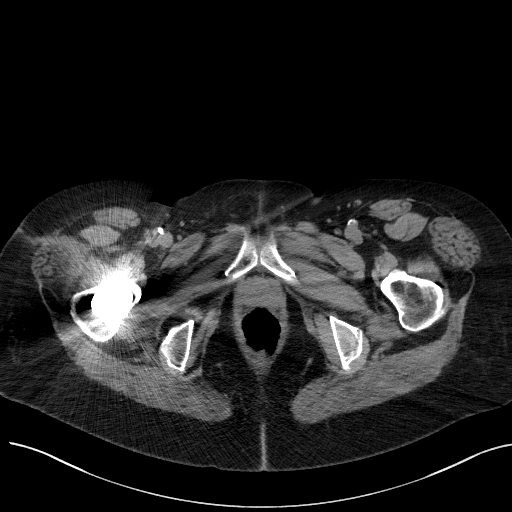
[im 12/123  lung]
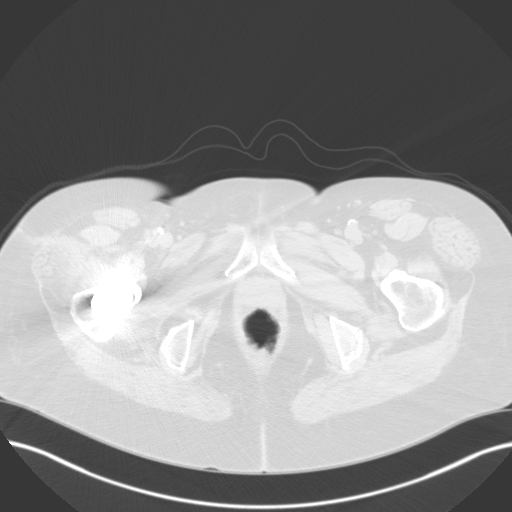
[im 23/123  lung]
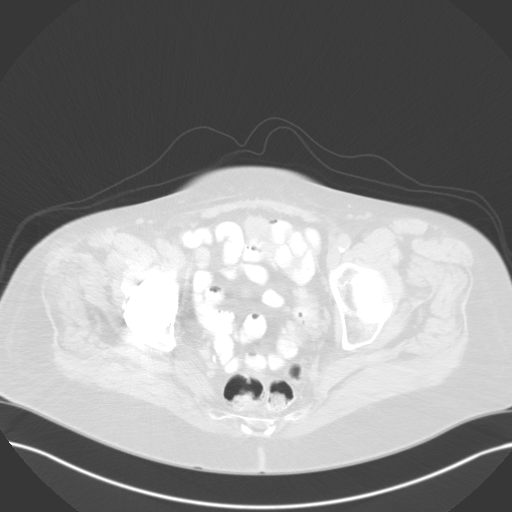
[im 34/123  lung]
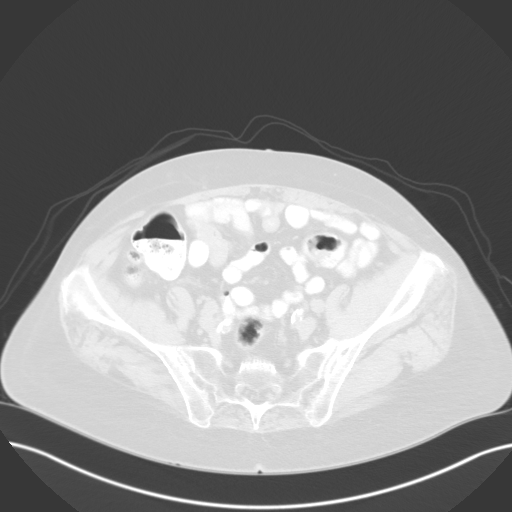
[im 45/123  lung]
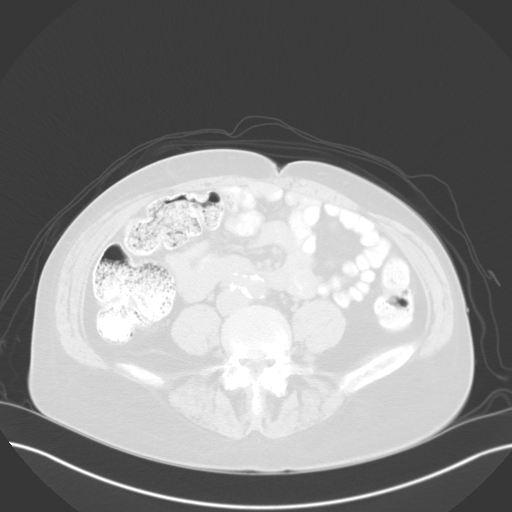
[im 56/123  mediastinal]
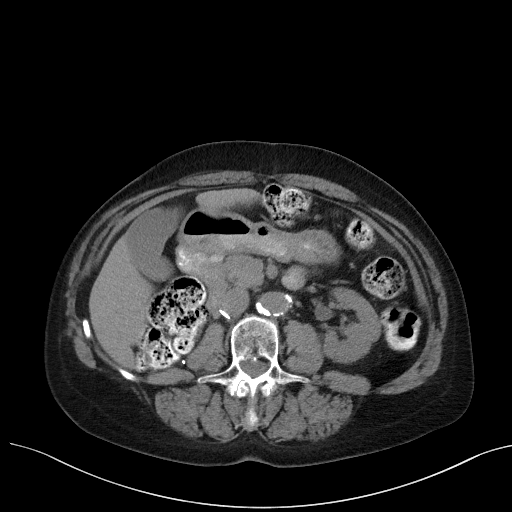
[im 56/123  lung]
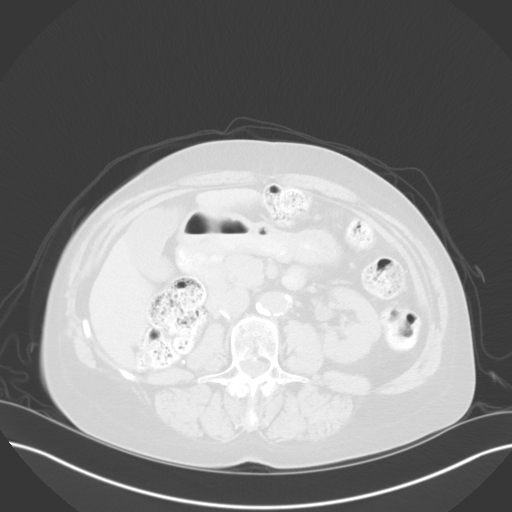
[im 67/123  lung]
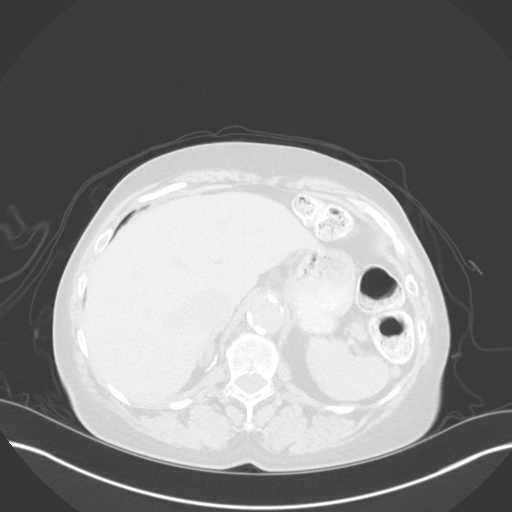
[im 78/123  lung]
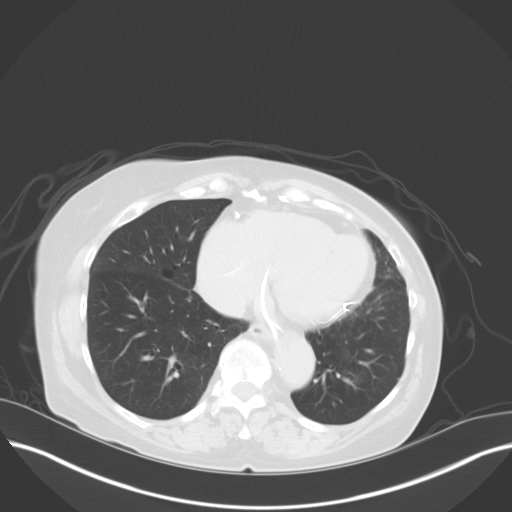
[im 89/123  lung]
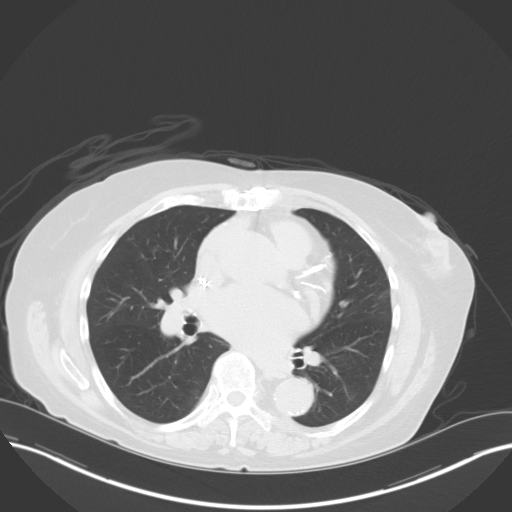
[im 100/123  mediastinal]
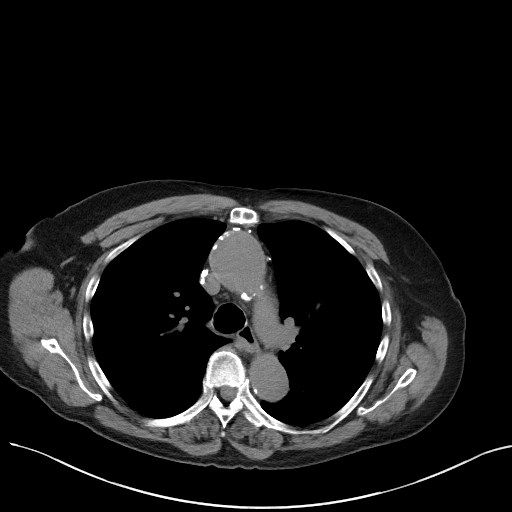
[im 100/123  lung]
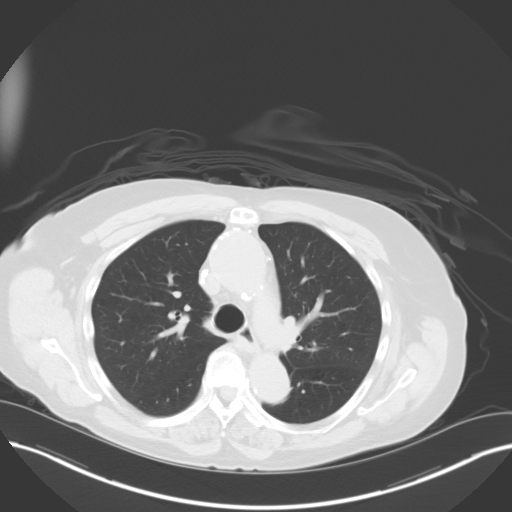
[im 111/123  lung]
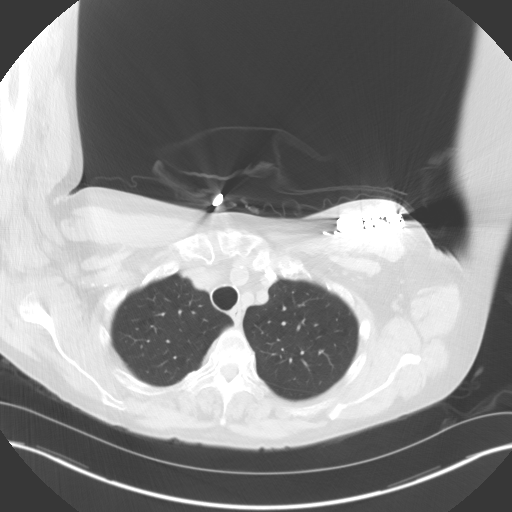

[Series 5: coronals · coronal · 1.00mm/px · 3 of 115 slices shown]
[im 23/115  lung]
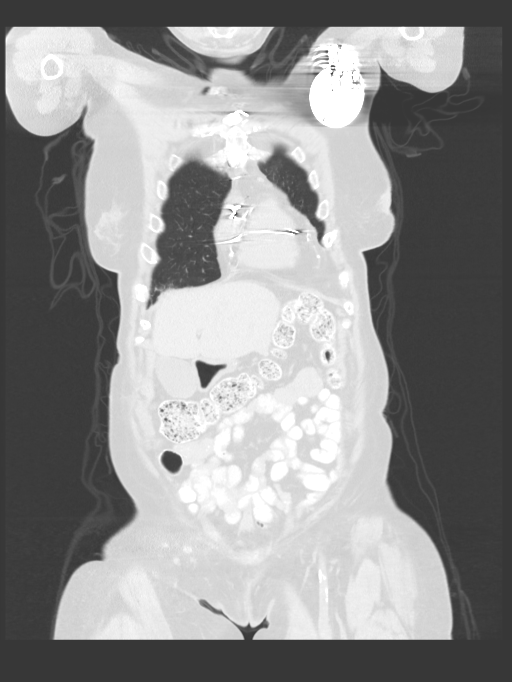
[im 46/115  lung]
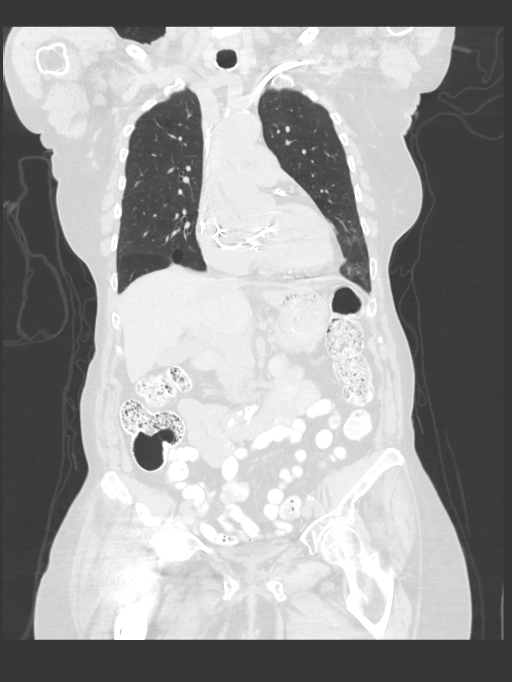
[im 69/115  lung]
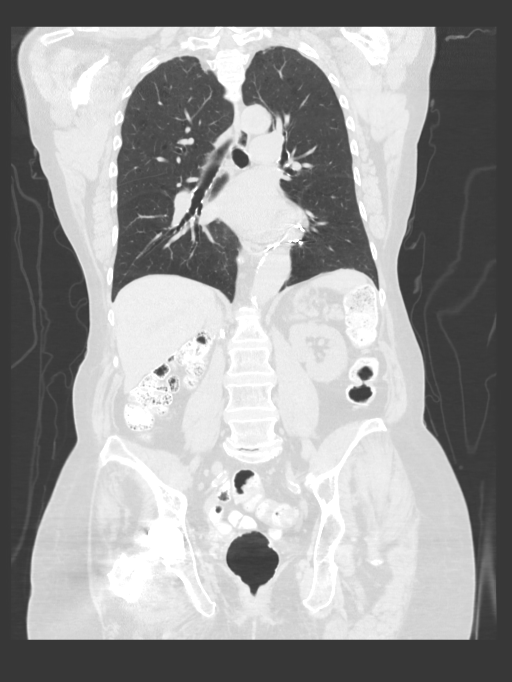

[13 of 36 positions shown; findings below may reference images not displayed]

FINDINGS: CT CHEST FINDINGS

Cardiovascular: Left chest pacemaker with leads in the right atrium
right ventricle and coronary sinus. Aortic atherosclerosis with
aneurysmal dilation of the ascending aorta measuring 4.3 cm
previously 4.2 cm. Four-chamber cardiac enlargement.

Mediastinum/Nodes: No supraclavicular adenopathy. No discrete
thyroid nodule. No pathologically enlarged mediastinal, hilar or
axillary lymph nodes, noting limited sensitivity for the detection
of hilar adenopathy on this noncontrast study.

Lungs/Pleura: Similar mild biapical pleuroparenchymal scarring.
Scattered tiny pulmonary nodules are stable and considered benign.
No new suspicious pulmonary nodules or masses. No pleural effusion.
No pneumothorax.

Musculoskeletal: Prior median sternotomy. No aggressive lytic or
blastic lesion of bone.

CT ABDOMEN PELVIS FINDINGS

Hepatobiliary: No suspicious hepatic lesion on this noncontrast
examination. Gallbladder is unremarkable. No biliary ductal
dilation.

Pancreas: No pancreatic ductal dilation or evidence of acute
inflammation.

Spleen: No splenomegaly or focal splenic lesion.

Adrenals/Urinary Tract: Adrenal glands appear normal.

Status post right nephrectomy without new suspicious soft tissue in
the nephrectomy bed.

Left kidney is unremarkable without hydronephrosis or contour
deforming renal mass.

Urinary bladder is nondistended limiting evaluation.

Stomach/Bowel: Radiopaque enteric contrast material traverses the
sigmoid colon. Small hiatal hernia otherwise the stomach is
unremarkable for degree of distension. No pathologic dilation of
small or large bowel. Moderate volume of formed stool throughout the
colon suggestive of constipation.

Vascular/Lymphatic: Postoperative changes about the IVC with similar
appearance to prior imaging. Aortic atherosclerosis without
abdominal aortic aneurysm. No pathologically enlarged abdominal or
pelvic lymph nodes.

Reproductive: Status post hysterectomy. No adnexal masses.

Other: No significant abdominopelvic free fluid.

Musculoskeletal: Multilevel degenerative changes spine. Right total
hip arthroplasty. No aggressive lytic or blastic lesion of bone.
IMPRESSION: 1. Stable examination status post right nephrectomy without evidence
of local recurrence or metastatic disease within the chest, abdomen,
or pelvis.
2. Moderate volume of formed stool throughout the colon suggestive
of constipation.
3. Similar 4.3 cm ascending thoracic aortic aneurysm. Attention on
follow-up imaging in this oncology patient is sufficient for
surveillance. This recommendation follows [B4]
ACCF/AHA/AATS/ACR/ASA/SCA/MENDIVIL/MENDIVIL/MENDIVIL/MENDIVIL Guidelines for the
Diagnosis and Management of Patients with Thoracic Aortic Disease.
Circulation. [B4]; 121: [id number]. Aortic aneurysm NOS ([B4]-[B4])
4.  Aortic Atherosclerosis ([B4]-[B4]).

## 2021-08-23 IMAGING — CT CT ABD-PELV W/O CM
2 of 4 series · 13 of 36 positions shown, 16 images · non-contrast
Comparison: Multiple priors including most recent CT [DATE]

CLINICAL DATA: History of renal cell carcinoma status post right
nephrectomy. Follow-up.

* Tracking Code: BO *
EXAM:
CT CHEST, ABDOMEN AND PELVIS WITHOUT CONTRAST
TECHNIQUE: Multidetector CT imaging of the chest, abdomen and pelvis was
performed following the standard protocol without IV contrast.
RADIATION DOSE REDUCTION: This exam was performed according to the
departmental dose-optimization program which includes automated
exposure control, adjustment of the mA and/or kV according to
patient size and/or use of iterative reconstruction technique.

[Series 2: cap w/o · axial · non-contrast · 0.79mm/px · z∈[-468,+27]mm · 10 of 123 slices shown, 13 images]
[im 12/123  mediastinal]
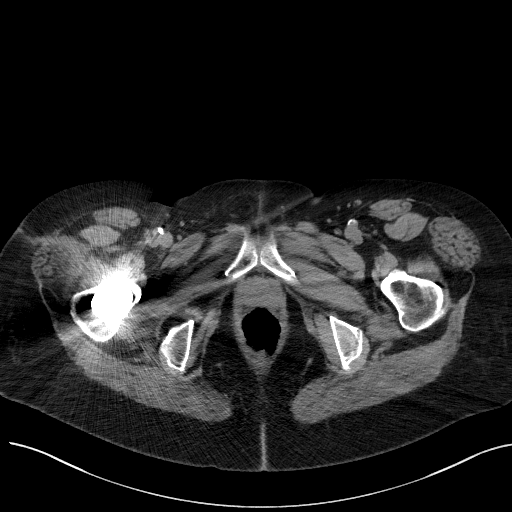
[im 12/123  lung]
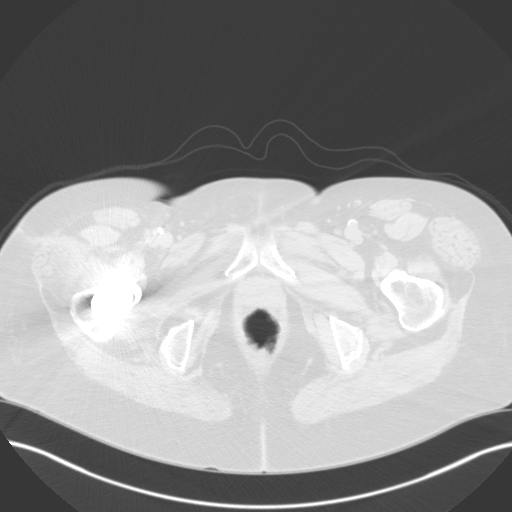
[im 23/123  lung]
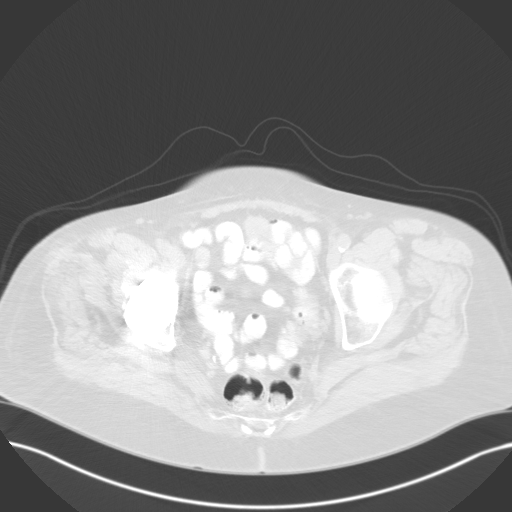
[im 34/123  lung]
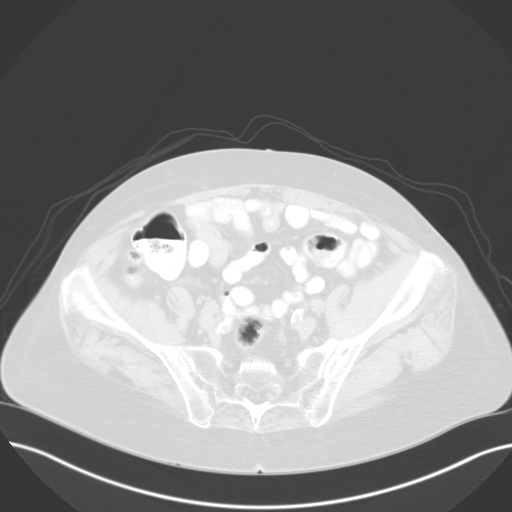
[im 45/123  lung]
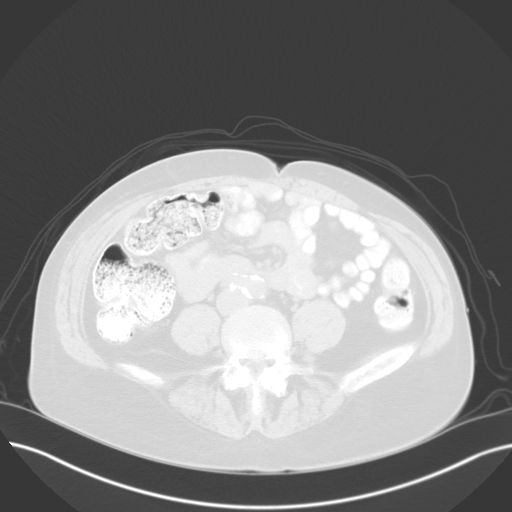
[im 56/123  mediastinal]
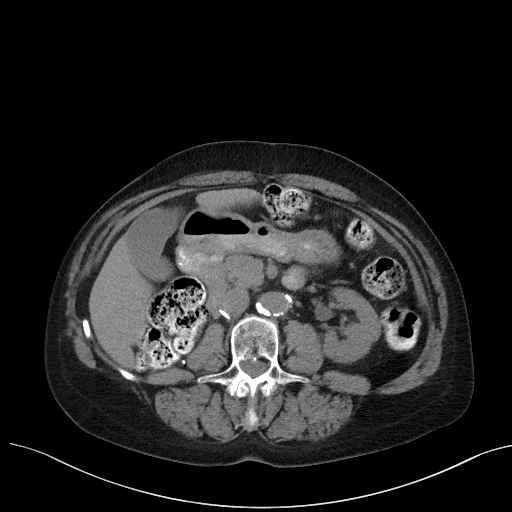
[im 56/123  lung]
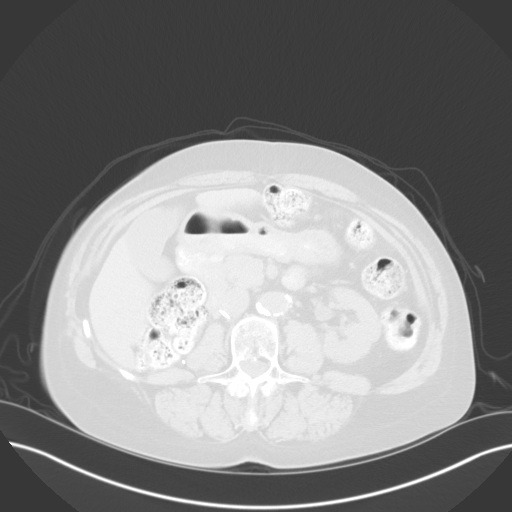
[im 67/123  lung]
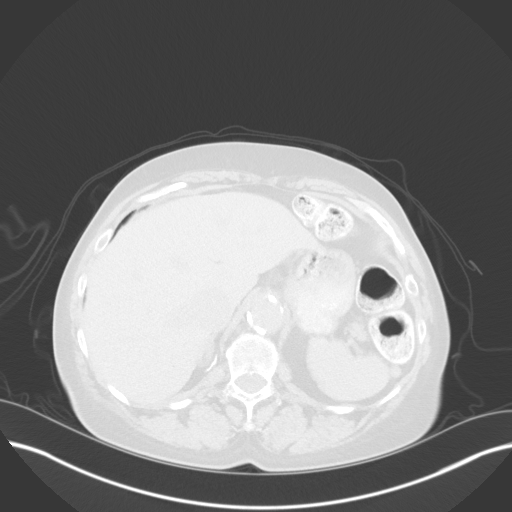
[im 78/123  lung]
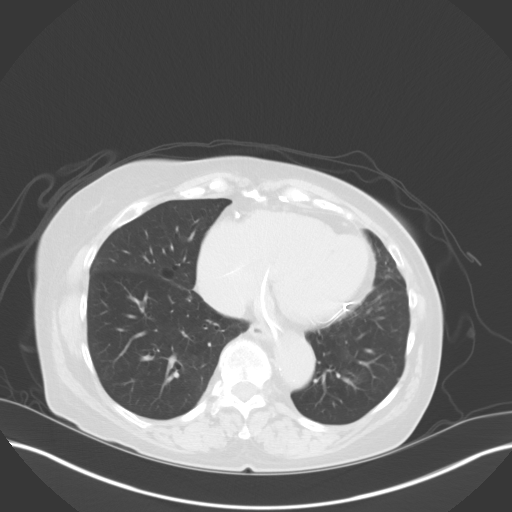
[im 89/123  lung]
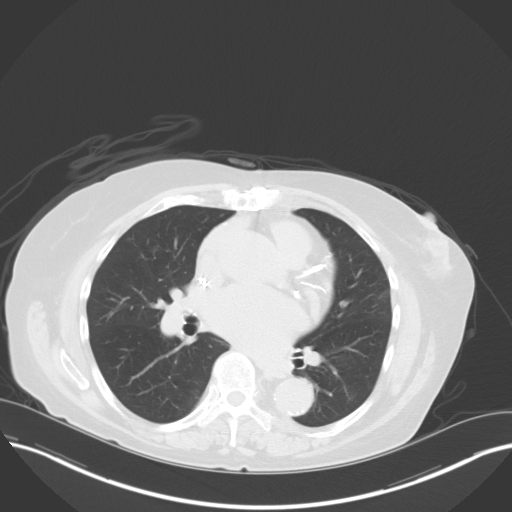
[im 100/123  mediastinal]
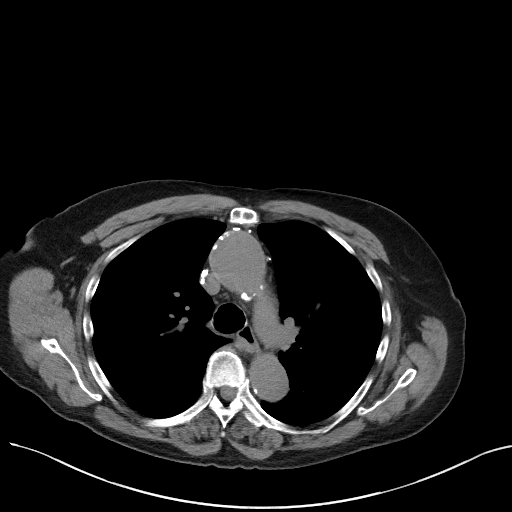
[im 100/123  lung]
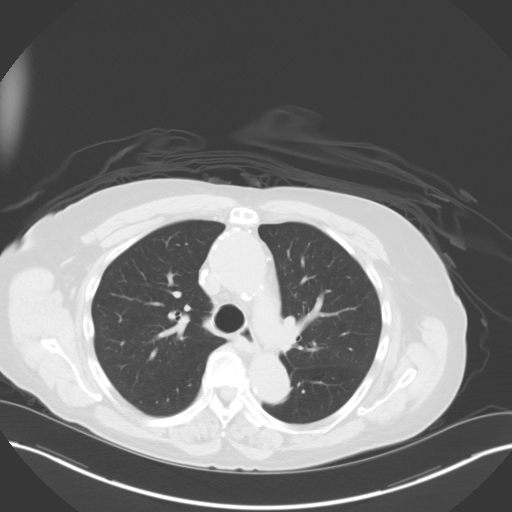
[im 111/123  lung]
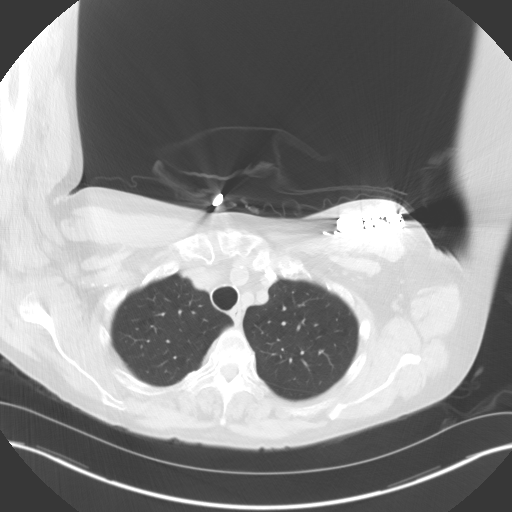

[Series 5: coronals · coronal · 1.00mm/px · 3 of 115 slices shown]
[im 23/115  lung]
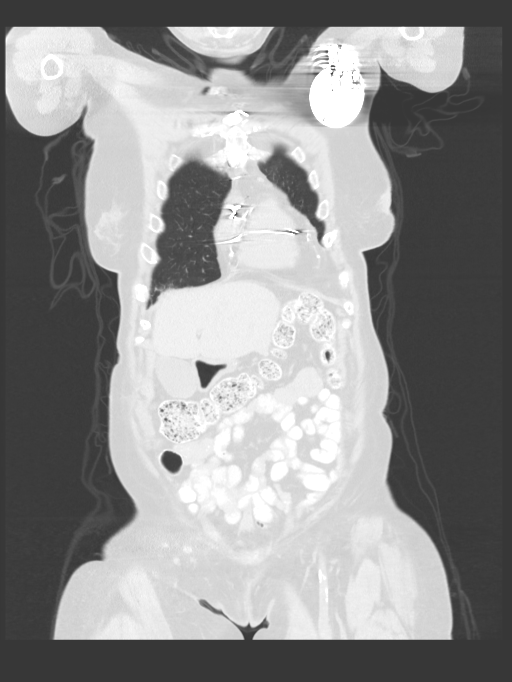
[im 46/115  lung]
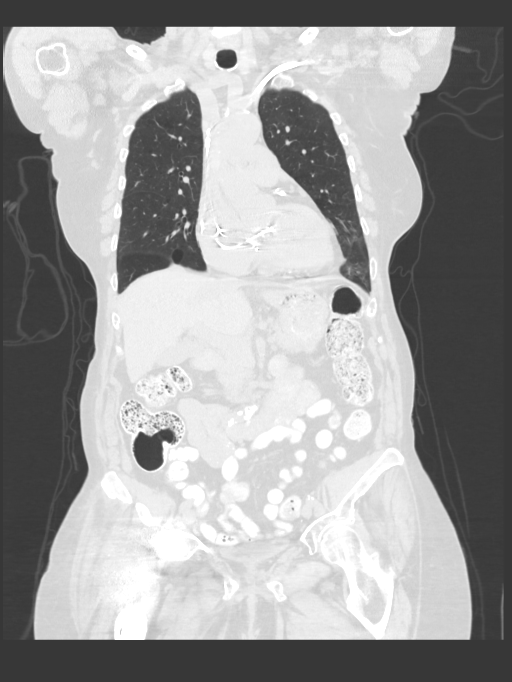
[im 69/115  lung]
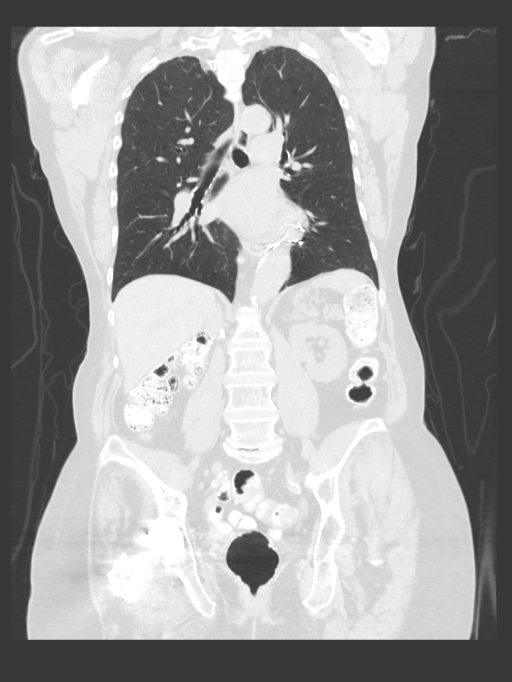

[13 of 36 positions shown; findings below may reference images not displayed]

FINDINGS: CT CHEST FINDINGS

Cardiovascular: Left chest pacemaker with leads in the right atrium
right ventricle and coronary sinus. Aortic atherosclerosis with
aneurysmal dilation of the ascending aorta measuring 4.3 cm
previously 4.2 cm. Four-chamber cardiac enlargement.

Mediastinum/Nodes: No supraclavicular adenopathy. No discrete
thyroid nodule. No pathologically enlarged mediastinal, hilar or
axillary lymph nodes, noting limited sensitivity for the detection
of hilar adenopathy on this noncontrast study.

Lungs/Pleura: Similar mild biapical pleuroparenchymal scarring.
Scattered tiny pulmonary nodules are stable and considered benign.
No new suspicious pulmonary nodules or masses. No pleural effusion.
No pneumothorax.

Musculoskeletal: Prior median sternotomy. No aggressive lytic or
blastic lesion of bone.

CT ABDOMEN PELVIS FINDINGS

Hepatobiliary: No suspicious hepatic lesion on this noncontrast
examination. Gallbladder is unremarkable. No biliary ductal
dilation.

Pancreas: No pancreatic ductal dilation or evidence of acute
inflammation.

Spleen: No splenomegaly or focal splenic lesion.

Adrenals/Urinary Tract: Adrenal glands appear normal.

Status post right nephrectomy without new suspicious soft tissue in
the nephrectomy bed.

Left kidney is unremarkable without hydronephrosis or contour
deforming renal mass.

Urinary bladder is nondistended limiting evaluation.

Stomach/Bowel: Radiopaque enteric contrast material traverses the
sigmoid colon. Small hiatal hernia otherwise the stomach is
unremarkable for degree of distension. No pathologic dilation of
small or large bowel. Moderate volume of formed stool throughout the
colon suggestive of constipation.

Vascular/Lymphatic: Postoperative changes about the IVC with similar
appearance to prior imaging. Aortic atherosclerosis without
abdominal aortic aneurysm. No pathologically enlarged abdominal or
pelvic lymph nodes.

Reproductive: Status post hysterectomy. No adnexal masses.

Other: No significant abdominopelvic free fluid.

Musculoskeletal: Multilevel degenerative changes spine. Right total
hip arthroplasty. No aggressive lytic or blastic lesion of bone.
IMPRESSION: 1. Stable examination status post right nephrectomy without evidence
of local recurrence or metastatic disease within the chest, abdomen,
or pelvis.
2. Moderate volume of formed stool throughout the colon suggestive
of constipation.
3. Similar 4.3 cm ascending thoracic aortic aneurysm. Attention on
follow-up imaging in this oncology patient is sufficient for
surveillance. This recommendation follows [B4]
ACCF/AHA/AATS/ACR/ASA/SCA/MENDIVIL/MENDIVIL/MENDIVIL/MENDIVIL Guidelines for the
Diagnosis and Management of Patients with Thoracic Aortic Disease.
Circulation. [B4]; 121: [id number]. Aortic aneurysm NOS ([B4]-[B4])
4.  Aortic Atherosclerosis ([B4]-[B4]).

## 2021-08-25 ENCOUNTER — Ambulatory Visit (HOSPITAL_COMMUNITY)
Admission: RE | Admit: 2021-08-25 | Discharge: 2021-08-25 | Disposition: A | Discharge status: Home / Self Care | Payer: Medicare Other | Source: Ambulatory Visit | Attending: Internal Medicine | Admitting: Internal Medicine

## 2021-08-25 DIAGNOSIS — C7931 Secondary malignant neoplasm of brain: Secondary | ICD-10-CM | POA: Insufficient documentation

## 2021-08-25 IMAGING — MR MR HEAD WO/W CM
13 of 14 series · 40 of 48 positions shown · IV contrast (gadavist)
Comparison: Head MRI [DATE]

CLINICAL DATA: Brain/CNS neoplasm, assess treatment response.
History of renal cell carcinoma with brain metastases. Prior
postoperative SRS in [DATE] and subsequent salvage SRS in [DATE].
Treatment of a left frontal metastasis with SRS on [DATE].

EXAM:
MRI HEAD WITHOUT AND WITH CONTRAST
TECHNIQUE: Multiplanar, multiecho pulse sequences of the brain and surrounding
structures were obtained without and with intravenous contrast.
CONTRAST:  8mL GADAVIST GADOBUTROL 1 MMOL/ML IV SOLN

[Series 5: FLAIR · sagittal · 3.0mm · 0.78mm/px · 3 of 39 slices shown (1 of 3)]
[im 1/39]
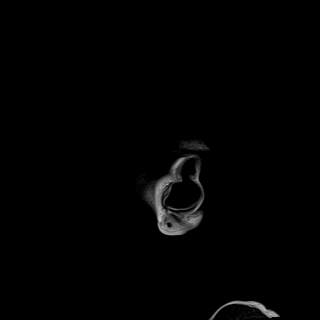
[im 20/39]
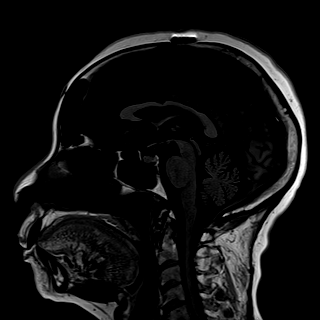
[im 39/39]
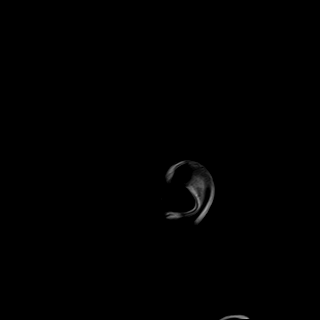

[Series 6: DWI · axial · 3.0mm · 0.88mm/px · z∈[-99,+54]mm · 5 of 104 slices shown (1 of 2)]
[im 1/104]
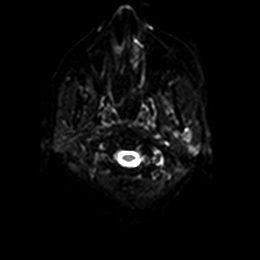
[im 26/104]
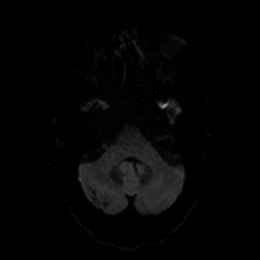
[im 52/104]
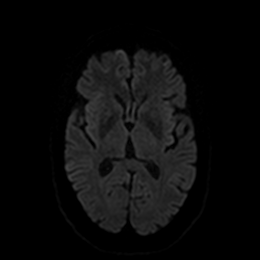
[im 78/104]
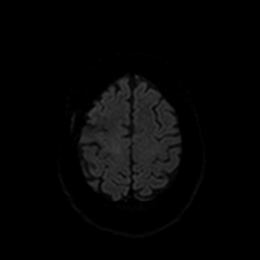
[im 104/104]
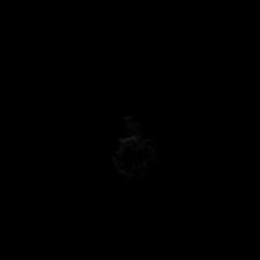

[Series 7: DWI · axial · 3.0mm · 0.88mm/px · z∈[-99,+54]mm · 2 of 52 slices shown (2 of 2)]
[im 1/52]
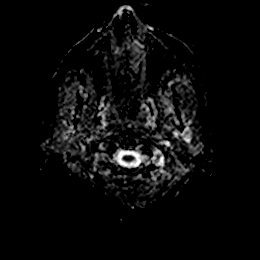
[im 52/52]
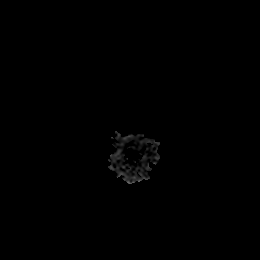

[Series 8: T2 · axial · 5.0mm · 0.78mm/px · 1 of 28 slices shown (1 of 2)]
[im 1/28]
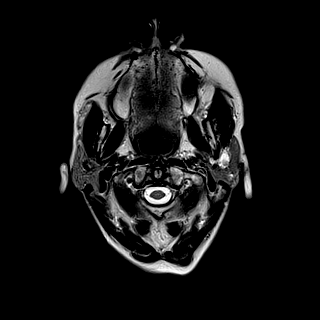

[Series 9: FLAIR · axial · 2.0mm · 0.49mm/px · z∈[-118,+59]mm · 4 of 75 slices shown (2 of 3)]
[im 1/75]
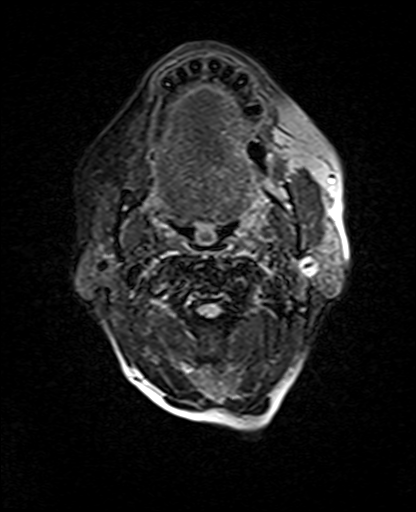
[im 25/75]
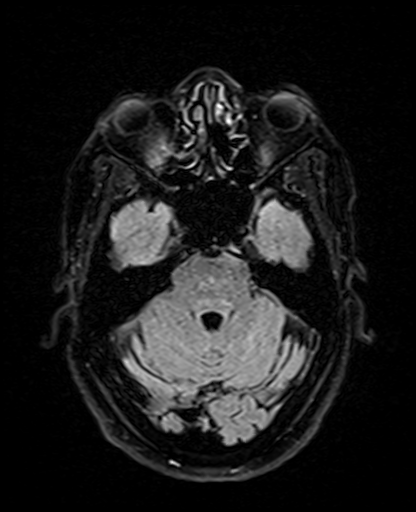
[im 50/75]
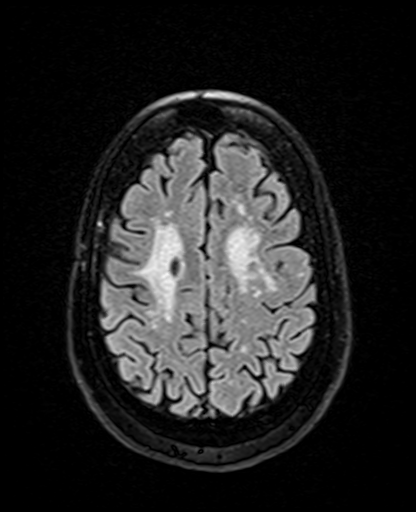
[im 75/75]
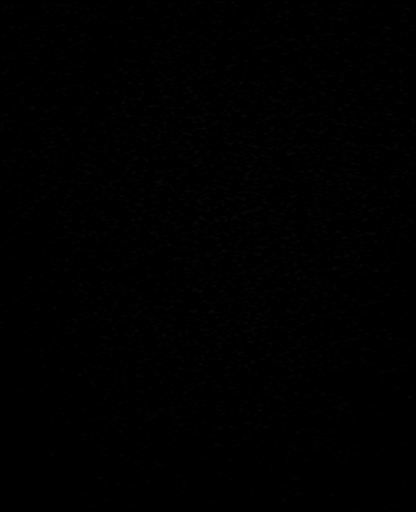

[Series 10: mag_images · axial · 3.0mm · 0.98mm/px · z∈[-111,+65]mm · 3 of 60 slices shown]
[im 1/60]
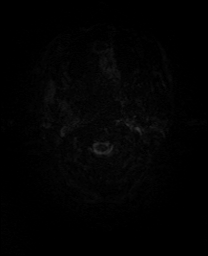
[im 30/60]
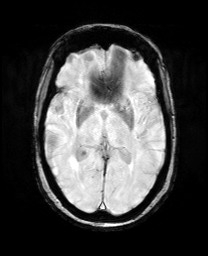
[im 60/60]
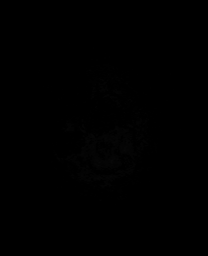

[Series 11: pha_images · axial · 3.0mm · 0.98mm/px · z∈[-111,+65]mm · 3 of 60 slices shown]
[im 1/60]
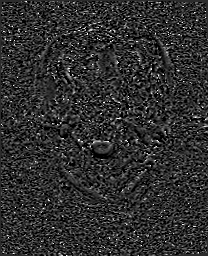
[im 30/60]
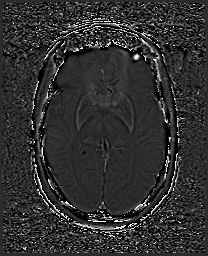
[im 60/60]
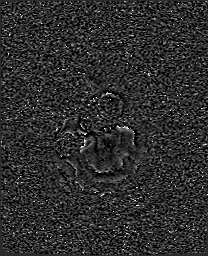

[Series 12: swi_images · axial · 3.0mm · 0.98mm/px · z∈[-111,+65]mm · 3 of 60 slices shown]
[im 1/60]
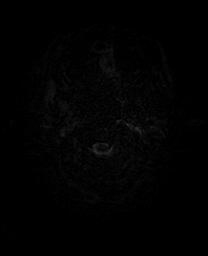
[im 30/60]
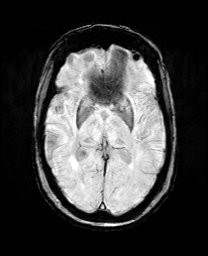
[im 60/60]
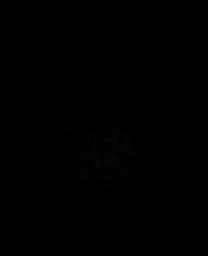

[Series 13: mip_images(sw) · axial · 24.0mm · 0.98mm/px · z∈[-101,+55]mm · 2 of 51 slices shown]
[im 1/51]
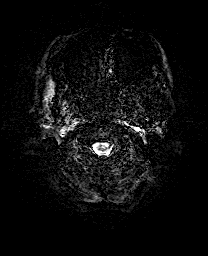
[im 51/51]
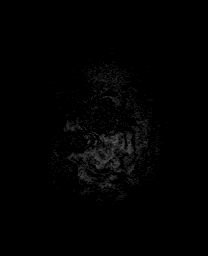

[Series 14: t1_mprage_tra_p2_iso no angle · axial · 1.0mm · 0.98mm/px · z∈[-114,+60]mm · 8 of 175 slices shown]
[im 1/175]
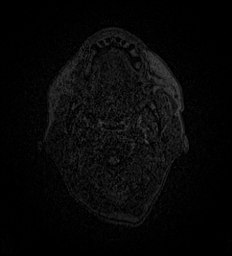
[im 25/175]
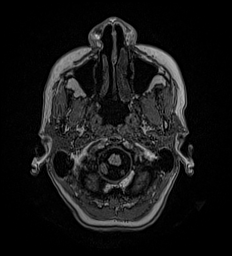
[im 50/175]
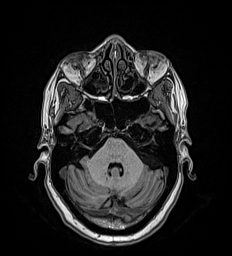
[im 75/175]
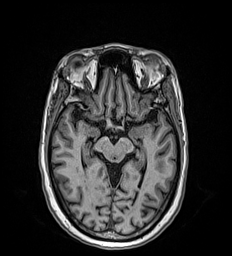
[im 100/175]
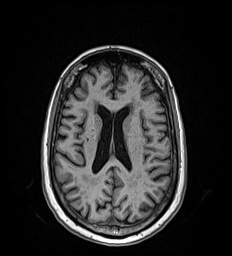
[im 125/175]
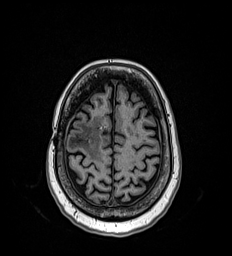
[im 150/175]
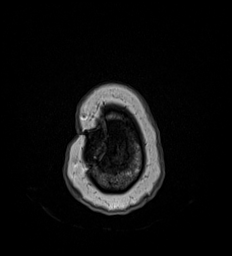
[im 175/175]
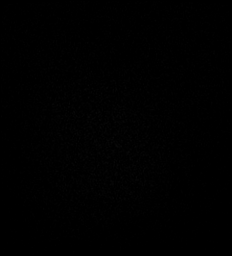

[Series 15: T2 · coronal · 3.0mm · 0.34mm/px · 2 of 48 slices shown (2 of 2)]
[im 1/48]
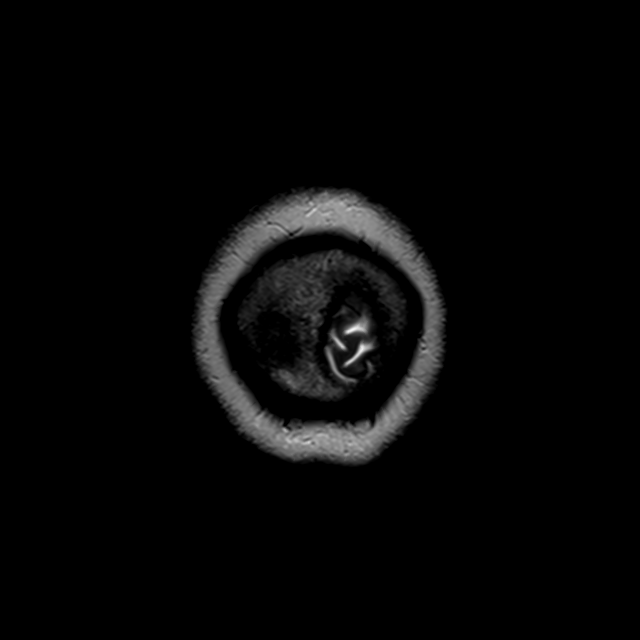
[im 48/48]
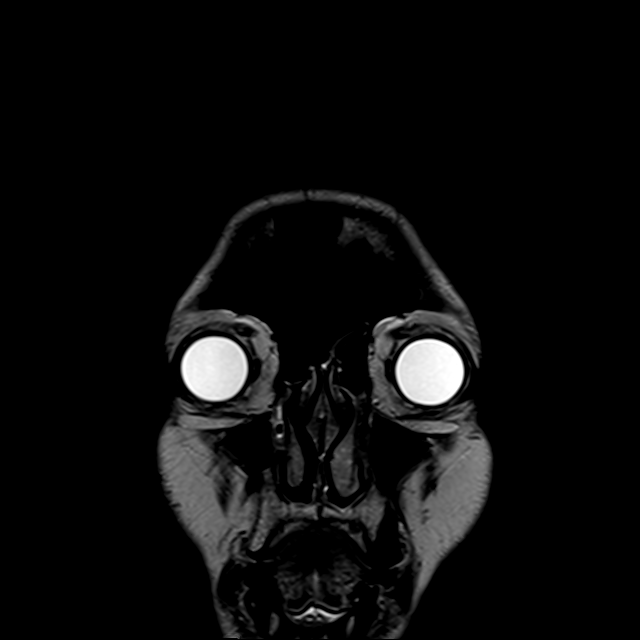

[Series 17: T1 post-contrast · coronal · 3.0mm · 0.34mm/px · 2 of 48 slices shown]
[im 1/48]
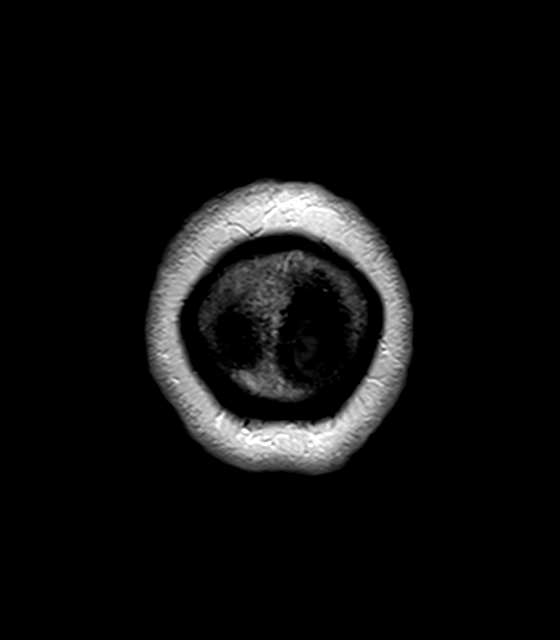
[im 48/48]
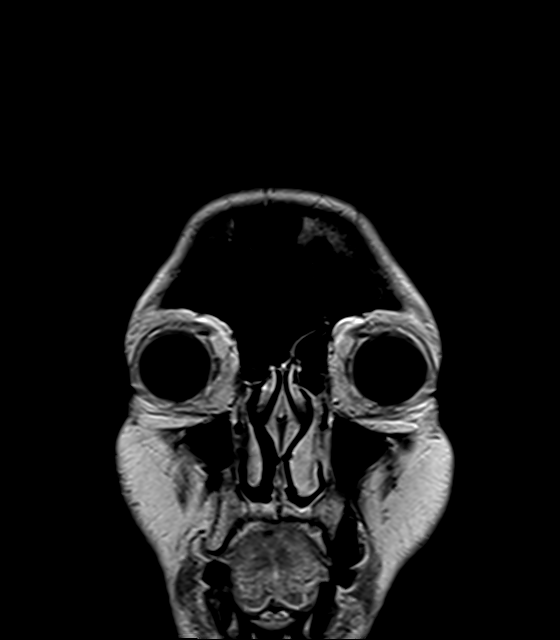

[Series 18: FLAIR · sagittal · 3.0mm · 0.72mm/px · 2 of 39 slices shown (3 of 3)]
[im 1/39]
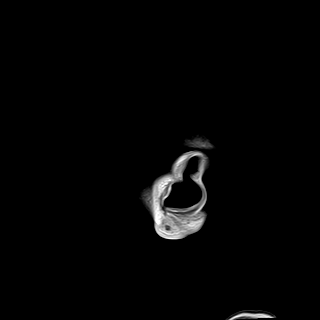
[im 39/39]
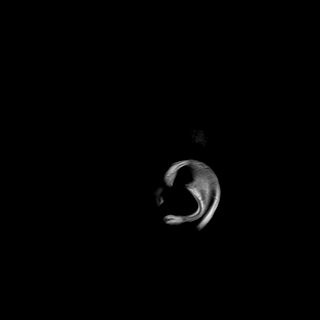

[40 of 48 positions shown; findings below may reference images not displayed]

FINDINGS: Brain:

New lesions: None.

Larger lesions: None.

Stable or smaller lesions:

1. Decreased conspicuity of the anterior left frontal metastasis,
now 2 mm (series 18, image 28). Associated chronic blood products.

Other brain findings: A resection cavity is again noted in the high
posterior right frontal lobe containing blood products and unchanged
non masslike enhancement. T2 hyperintensity in the surrounding white
matter is unchanged. Patchy to confluent T2 hyperintensities
elsewhere in the cerebral white matter bilaterally are also
unchanged, or moderately advanced for age, and are nonspecific but
may reflect a combination of chronic small vessel ischemia and post
treatment changes. Chronic lacunar infarcts are again noted in the
basal ganglia, thalami, and cerebellum bilaterally. Scattered
chronic microhemorrhages are again noted in both cerebral
hemispheres.

A 1.7 cm focus of nonenhancing T2 hyperintensity involving cortex
and juxtacortical white matter at the right temporoparietal junction
is unchanged (series 9, image 40). No acute infarct, midline shift,
or significant extra-axial fluid collection is evident. There is
mild cerebral atrophy.

Vascular: Major intracranial vascular flow voids are preserved.

Skull and upper cervical spine: Unchanged 10 mm enhancing right
frontal skull lesion which extends through the inner table to the
dura (series 16, image 111).

Sinuses/Orbits: Unchanged mild bilateral proptosis and depression of
the right lamina papyracea. Paranasal sinuses and mastoid air cells
are clear.

Other: None.
IMPRESSION: 1. No evidence of new intracranial metastases.
2. Decreased conspicuity of the treated anterior left frontal
metastasis.
3. Unchanged appearance of the right frontal resection cavity
without evidence of local tumor recurrence.
4. Unchanged right frontal skull metastasis.
5. Unchanged nonenhancing cortical T2 signal abnormality at the
right temporoparietal junction which remains indeterminate but could
reflect a low grade glioma or cortical dysplasia.

## 2021-08-25 MED ORDER — GADOBUTROL 1 MMOL/ML IV SOLN
8.0000 mL | Freq: Once | INTRAVENOUS | Status: AC | PRN
  Administered 2021-08-25: 8 mL via INTRAVENOUS

## 2021-08-25 NOTE — Progress Notes (Signed)
Patient here today at [facility] for MRI brain w wo contrast. Patient has Product/process development scientist. Heart connect performed with Jonathan-Rep. Patient programmed VOO 90. Andy-cards PA notified. Will re-program once scan is completed.  ?

## 2021-08-29 ENCOUNTER — Inpatient Hospital Stay: Payer: Medicare Other

## 2021-08-30 ENCOUNTER — Inpatient Hospital Stay (HOSPITAL_BASED_OUTPATIENT_CLINIC_OR_DEPARTMENT_OTHER): Payer: Medicare Other | Admitting: Oncology

## 2021-08-30 ENCOUNTER — Inpatient Hospital Stay (HOSPITAL_BASED_OUTPATIENT_CLINIC_OR_DEPARTMENT_OTHER): Payer: Medicare Other | Admitting: Internal Medicine

## 2021-08-30 ENCOUNTER — Other Ambulatory Visit: Payer: Self-pay

## 2021-08-30 VITALS — BP 112/75 | HR 69 | Temp 97.8°F | Resp 18 | Ht 68.0 in | Wt 178.7 lb

## 2021-08-30 DIAGNOSIS — C7931 Secondary malignant neoplasm of brain: Secondary | ICD-10-CM

## 2021-08-30 DIAGNOSIS — N189 Chronic kidney disease, unspecified: Secondary | ICD-10-CM | POA: Diagnosis not present

## 2021-08-30 DIAGNOSIS — N2889 Other specified disorders of kidney and ureter: Secondary | ICD-10-CM | POA: Diagnosis not present

## 2021-08-30 DIAGNOSIS — R918 Other nonspecific abnormal finding of lung field: Secondary | ICD-10-CM

## 2021-08-30 DIAGNOSIS — Z90710 Acquired absence of both cervix and uterus: Secondary | ICD-10-CM | POA: Diagnosis not present

## 2021-08-30 DIAGNOSIS — G9389 Other specified disorders of brain: Secondary | ICD-10-CM | POA: Diagnosis not present

## 2021-08-30 DIAGNOSIS — Z87891 Personal history of nicotine dependence: Secondary | ICD-10-CM | POA: Insufficient documentation

## 2021-08-30 DIAGNOSIS — C649 Malignant neoplasm of unspecified kidney, except renal pelvis: Secondary | ICD-10-CM | POA: Insufficient documentation

## 2021-08-30 DIAGNOSIS — I130 Hypertensive heart and chronic kidney disease with heart failure and stage 1 through stage 4 chronic kidney disease, or unspecified chronic kidney disease: Secondary | ICD-10-CM | POA: Insufficient documentation

## 2021-08-30 DIAGNOSIS — D649 Anemia, unspecified: Secondary | ICD-10-CM | POA: Diagnosis not present

## 2021-08-30 NOTE — Progress Notes (Signed)
? ?[facility] [address]  ?[place], [place] [address] ?[phone] ? ? ?Interval Evaluation ? ?Date of Service: 08/30/21 ?Patient Name: Jacqueline Palmer ?Patient MRN: [id number] ?Patient DOB: Nov 26, 1945 ?Provider: Ventura Sellers, MD ? ?Identifying Statement:  ?Jacqueline Palmer is a 76 y.o. female with Malignant neoplasm metastatic to brain [facility]) [C79.31]  ? ?Primary Cancer: Renal Cell Carcinoma, Stage IV ? ?Oncologic History: ?07/30/19: Right frontal craniotomy, resection of solitary brain metastasis at [facility] ?10/06/19: Post-operative SRS at [facility] (Dr. Tyrone Nine) ?03/09/20: Salvage SRS to R frontal+LMD 27/3x ?02/08/21: Salvage SRS to L frontal Lisbeth Renshaw) ? ?Interval History: ? Jacqueline Palmer presents today for follow up after recent MRI brain.  No new or progressive complaints today.  No recent seizures, continues to take Keppra '500mg'$  twice per day.  Underwent right hip replacement recently, has been healing well, working with PT.  Continues to be active mentally and engaging socially. ? ?H+P (02/24/20) Patient presents to review recent changes on brain MRI.  She initially presented in March 2021 with a seizure, described as "left side clenching up, lost awareness for a few minutes".  CNS imaging demonstrated an enhancing right frontal mass, c/w likely metastasis from renal cell carcinoma.  Craniotomy was performed at [facility] and followed with surgical bed radiosurgery at [facility].  She did have one recurrence of seizure activity in late August, at which time she was restarted on Keppra.  Unclear on why this was discontinued.  Recent MRI demonstrated some change and she presents today for treatment recommendations.  No neurologic complaints aside from seizures. ? ?Medications: ?Current Outpatient Medications on File Prior to Visit  ?Medication Sig Dispense Refill  ? acetaminophen (TYLENOL) 500 MG tablet Take 1,000 mg by mouth in the morning and at bedtime.    ? colchicine 0.6 MG tablet  Take 1 tablet (0.6 mg total) by mouth at bedtime. 90 tablet 3  ? hydrALAZINE (APRESOLINE) 10 MG tablet TAKE ONE TABLET BY MOUTH THREE TIMES DAILY 270 tablet 2  ? levETIRAcetam (KEPPRA) 500 MG tablet Take 1 tablet (500 mg total) by mouth 2 (two) times daily. 180 tablet 3  ? levothyroxine (SYNTHROID) 25 MCG tablet TAKE ONE TABLET BY MOUTH DAILY IN THE MORNING 30-60 MINUTES BEFORE BREAKFAST ON AN EMPTY STOMACH WITH A FULL GLASS OF WATER 90 tablet 0  ? metoprolol succinate (TOPROL-XL) 25 MG 24 hr tablet Take 1 tablet (25 mg total) by mouth daily. 90 tablet 3  ? OVER THE COUNTER MEDICATION Apply 1 application topically 2 (two) times daily. Pain cream in groin area    ? polyethylene glycol (MIRALAX / GLYCOLAX) 17 g packet Take 17 g by mouth daily as needed for mild constipation.    ? pravastatin (PRAVACHOL) 20 MG tablet Take 20 mg by mouth at bedtime.    ? spironolactone (ALDACTONE) 25 MG tablet TAKE ONE TABLET BY MOUTH ONE TIME DAILY 90 tablet 0  ? torsemide (DEMADEX) 20 MG tablet Take 2 tabs PO Q a.m and one tab PO Q p.m 90 tablet 6  ? traMADol (ULTRAM) 50 MG tablet Take 50 mg by mouth 2 (two) times daily as needed.    ? warfarin (COUMADIN) 5 MG tablet TAKE ONE TABLET BY MOUTH DAILY IN THE EVENING 90 tablet 0  ? nitroGLYCERIN (NITROSTAT) 0.4 MG SL tablet Place 1 tablet (0.4 mg total) under the tongue every 5 (five) minutes as needed for chest pain. (Patient not taking: Reported on 08/30/2021) 25 tablet 3  ? ?  No current facility-administered medications on file prior to visit.  ? ? ?Allergies:  ?Allergies  ?Allergen Reactions  ? Avocado Shortness Of Breath  ?  Before 2007  ? Banana Anaphylaxis and Swelling  ? Plantain Anaphylaxis and Swelling  ? Ace Inhibitors Cough  ? Codeine Other (See Comments)  ?  Nausea and Feels Jittery  ? Prednisone Swelling  ?  Of face and lower extremities  ? Tape Hives  ?  Tape  ?Does better with paper tape   ? Latex Rash  ? Pharmabase Cosmetic [[place]] Swelling and Rash  ?  Some Soap,  perfumes,detergent  ? ?Past Medical History:  ?Past Medical History:  ?Diagnosis Date  ? Acute kidney injury ([place])   ? Arthritis   ? Atrial fibrillation ([place])   ? Cancer [facility])   ? 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 Cardiomyopathy ([place])   ? CHF (congestive heart failure) ([place])   ? Chronic a-fib ([place]) 08/14/2016  ? Chronic anticoagulation 08/14/2016  ? Coronary artery disease   ? 4v CABG 6/17  ? Graves disease   ? Hypertension   ? Hypothyroidism   ? 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 SN [id number][From patient's surgical info card]  ? Presence of permanent cardiac pacemaker   ? Seizures [place]   ? Thrombus 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 myocardial infarction   ? Thyroid disease   ? ?Past Surgical History:  ?Past Surgical History:  ?Procedure Laterality Date  ? ABDOMINAL HYSTERECTOMY    ? CORONARY ARTERY BYPASS GRAFT    ? CRANIOTOMY Right 07/30/2019  ? Right frontal  ? NEPHRECTOMY Right 10/16/2019  ? TOTAL HIP ARTHROPLASTY Right 07/11/2021  ? Procedure: RIGHT TOTAL HIP ARTHROPLASTY ANTERIOR APPROACH;  Surgeon: RFrederik Pear MD;  Location: [facility];  Service: Orthopedics;  Laterality: Right;  ? ?Social History:  ?Social History  ? ?Socioeconomic History  ? Marital status: Widowed  ?  Spouse name: Not on file  ? Number of children: 0  ? Years of education: Doctorate  ? Highest education level: Master's degree (e.g., MA, MS, MEng, MEd, MSW, MBA)  ?Occupational History  ? Occupation: Retired CClinical biochemist ?Tobacco Use  ? Smoking status: Former  ?  Types: Cigarettes  ?  Quit date: 07/30/2014  ?  Years since quitting: 7.0  ? Smokeless tobacco: Never  ?Vaping Use  ? Vaping Use: Never used  ?Substance and Sexual Activity  ? Alcohol use: Yes  ?  Comment: 1 glass wine twice weekly  ? Drug use: No  ? Sexual activity: Not on file  ?Other Topics Concern  ? Not on file  ?Social History Narrative  ? Right handed  ? Coffee daily/tea qod  ? ?Social Determinants of Health  ? ?Financial  Resource Strain: Not on file  ?Food Insecurity: Not on file  ?Transportation Needs: Not on file  ?Physical Activity: Not on file  ?Stress: Not on file  ?Social Connections: Not on file  ?Intimate Partner Violence: Not on file  ? ?Family History:  ?Family History  ?Problem Relation Age of Onset  ? Heart attack Mother   ? Heart attack Father   ? Heart attack Sister   ? Breast cancer Neg Hx   ? ? ?Review of Systems: ?Constitutional: Doesn't report fevers, chills or abnormal weight loss ?Eyes: Doesn't report blurriness of vision ?Ears, nose, mouth, throat, and face: Doesn't report sore throat ?Respiratory: Doesn't report cough, dyspnea or wheezes ?Cardiovascular: Doesn't report  palpitation, chest discomfort  ?Gastrointestinal:  Doesn't report nausea, constipation, diarrhea ?GU: Doesn't report incontinence ?Skin: Doesn't report skin rashes ?Neurological: Per HPI ?Musculoskeletal: Doesn't report joint pain ?Behavioral/Psych: Doesn't report anxiety ? ?Physical Exam: ? ?  08/30/2021  ?  8:29 AM 08/18/2021  ? 11:06 AM 07/26/2021  ? 11:02 AM  ?Vitals with BMI  ?Height '5\' 8"'$   '5\' 8"'$   ?Weight 178 lbs 11 oz 180 lbs 187 lbs 13 oz  ?BMI 27.18  28.56  ?Systolic 619 509 326  ?Diastolic 75 74 68  ?Pulse 69 70 70  ?  ? ?KPS: 80. ?General: Alert, cooperative, pleasant, in no acute distress ?Head: Normal ?EENT: No conjunctival injection or scleral icterus.  ?Lungs: Resp effort normal ?Cardiac: Regular rate ?Abdomen: Non-distended abdomen ?Skin: No rashes cyanosis or petechiae. ?Extremities: No clubbing or edema ? ?Neurologic Exam: ?Mental Status: Awake, alert, attentive to examiner. Oriented to self and environment. Language is fluent with intact comprehension.  ?Cranial Nerves: Visual acuity is grossly normal. Visual fields are full. Extra-ocular movements intact. No ptosis. Face is symmetric ?Motor: Tone and bulk are normal. Power is full in both arms and legs. Reflexes are symmetric, no pathologic reflexes present.  ?Sensory: Intact to  light touch ?Gait: Orthopedic limitations ? ?Labs: ?I have reviewed the data as listed ?   ?Component Value Date/Time  ? NA 137 08/23/2021 1135  ? NA 141 12/14/2020 1053  ? K 4.4 08/23/2021 1135  ? CL 100 08/23/2021 113

## 2021-08-30 NOTE — Progress Notes (Signed)
Hematology and Oncology Follow Up Visit ? ?Jacqueline Palmer ?[id number] ?1945-12-05 76 y.o. ?08/30/2021 8:17 AM ?Jacqueline Palmer, MDJohnson, Jacqueline Batman, MD  ? ?Principle Diagnosis: 83 year old woman with kidney cancer diagnosed in 2021.  She was found to have stage IV papillary tumor with isolated CNS involvement documented in January 2021.  ? ?Prior Therapy: ? ?He status post right frontal craniotomy and resection of a solitary brain metastasis in March 2021.  He received postoperative S[facility] at [facility]. ? ?He status post a radical nephrectomy on Oct 16, 2019 with a T3a N0 disease.  The final pathology showed undetermined subtype with nuclear grade 3. ? ?She is status post repeat stereotactic radiosurgery under the care of by Dr. Lisbeth Renshaw and Dr. Kathyrn Sheriff on March 03, 2020.  This was completed after she developed a 2.0 cm mass of the right frontal lobe indicating recurrent disease on MRI on February 03, 2020. ? ?She is status post stereotactic radiosurgery under the care of Dr. Lisbeth Renshaw completed on February 08, 2021 for an isolated left frontal lobe detected in September 2022 and MRI. ? ?Current therapy: Active surveillance. ? ?Interim History: [name] presents today for a follow-up visit.  Since the last visit, she reports of feeling well without any major complaints.  She underwent right hip replacement in February 2023 and currently recovering from it.  She is ambulating with the help of walker without any falls or syncope.  She denies any hospitalizations or complications.  She denies hematuria, dysuria.  She denies any bone pain or pathological fractures. ? ? ? ? ?Medications: Reviewed without changes. ?Current Outpatient Medications  ?Medication Sig Dispense Refill  ? acetaminophen (TYLENOL) 500 MG tablet Take 1,000 mg by mouth in the morning and at bedtime.    ? colchicine 0.6 MG tablet Take 1 tablet (0.6 mg total) by mouth at bedtime. 90 tablet 3  ? hydrALAZINE (APRESOLINE) 10 MG tablet  TAKE ONE TABLET BY MOUTH THREE TIMES DAILY 270 tablet 2  ? levETIRAcetam (KEPPRA) 500 MG tablet Take 1 tablet (500 mg total) by mouth 2 (two) times daily. 180 tablet 3  ? levothyroxine (SYNTHROID) 25 MCG tablet TAKE ONE TABLET BY MOUTH DAILY IN THE MORNING 30-60 MINUTES BEFORE BREAKFAST ON AN EMPTY STOMACH WITH A FULL GLASS OF WATER 90 tablet 0  ? metoprolol succinate (TOPROL-XL) 25 MG 24 hr tablet Take 1 tablet (25 mg total) by mouth daily. 90 tablet 3  ? nitroGLYCERIN (NITROSTAT) 0.4 MG SL tablet Place 1 tablet (0.4 mg total) under the tongue every 5 (five) minutes as needed for chest pain. 25 tablet 3  ? OVER THE COUNTER MEDICATION Apply 1 application topically 2 (two) times daily. Pain cream in groin area    ? polyethylene glycol (MIRALAX / GLYCOLAX) 17 g packet Take 17 g by mouth daily as needed for mild constipation.    ? pravastatin (PRAVACHOL) 20 MG tablet Take 20 mg by mouth at bedtime.    ? spironolactone (ALDACTONE) 25 MG tablet TAKE ONE TABLET BY MOUTH ONE TIME DAILY 90 tablet 0  ? torsemide (DEMADEX) 20 MG tablet Take 2 tabs PO Q a.m and one tab PO Q p.m 90 tablet 6  ? warfarin (COUMADIN) 5 MG tablet TAKE ONE TABLET BY MOUTH DAILY IN THE EVENING 90 tablet 0  ? ?No current facility-administered medications for this visit.  ? ? ? ?Allergies:  ?Allergies  ?Allergen Reactions  ? Avocado Shortness Of Breath  ?  Before 2007  ? Banana  Anaphylaxis and Swelling  ? Plantain Anaphylaxis and Swelling  ? Ace Inhibitors Cough  ? Codeine Other (See Comments)  ?  Nausea and Feels Jittery  ? Prednisone Swelling  ?  Of face and lower extremities  ? Tape Hives  ?  Tape  ?Does better with paper tape   ? Latex Rash  ? Pharmabase Cosmetic [[place]] Swelling and Rash  ?  Some Soap, perfumes,detergent  ? ? ? ? ?Physical Exam: ? ? ? ?Blood pressure 112/75, pulse 69, temperature 97.8 ?F (36.6 ?C), temperature source Temporal, resp. rate 18, height '5\' 8"'$  (1.727 m), weight 178 lb 11.2 oz (81.1 kg), SpO2 100 %. ? ? ?ECOG:  1 ? ? ? ?General appearance: Comfortable appearing without any discomfort ?Head: Normocephalic without any trauma ?Oropharynx: Mucous membranes are moist and pink without any thrush or ulcers. ?Eyes: Pupils are equal and round reactive to light. ?Lymph nodes: No cervical, supraclavicular, inguinal or axillary lymphadenopathy.   ?Heart:regular rate and rhythm.  S1 and S2 without leg edema. ?Lung: Clear without any rhonchi or wheezes.  No dullness to percussion. ?Abdomin: Soft, nontender, nondistended with good bowel sounds.  No hepatosplenomegaly. ?Musculoskeletal: No joint deformity or effusion.  Full range of motion noted. ?Neurological: No deficits noted on motor, sensory and deep tendon reflex exam. ?Skin: No petechial rash or dryness.  Appeared moist.  ? ? ? ? ? ? ? ? ?Lab Results: ?Lab Results  ?Component Value Date  ? WBC 6.3 08/23/2021  ? HGB 11.8 (L) 08/23/2021  ? HCT 36.4 08/23/2021  ? MCV 79.0 (L) 08/23/2021  ? PLT 262 08/23/2021  ? ?  Chemistry   ?   ?Component Value Date/Time  ? NA 137 08/23/2021 1135  ? NA 141 12/14/2020 1053  ? K 4.4 08/23/2021 1135  ? CL 100 08/23/2021 1135  ? CO2 28 08/23/2021 1135  ? BUN 44 (H) 08/23/2021 1135  ? BUN 45 (H) 12/14/2020 1053  ? CREATININE 2.58 (H) 08/23/2021 1135  ?    ?Component Value Date/Time  ? CALCIUM 10.1 08/23/2021 1135  ? ALKPHOS 102 08/23/2021 1135  ? AST 17 08/23/2021 1135  ? ALT 11 08/23/2021 1135  ? BILITOT 0.7 08/23/2021 1135  ?  ? ? ?. ?IMPRESSION: ?1. Stable examination status post right nephrectomy without evidence ?of local recurrence or metastatic disease within the chest, abdomen, ?or pelvis. ?2. Moderate volume of formed stool throughout the colon suggestive ?of constipation. ?3. Similar 4.3 cm ascending thoracic aortic aneurysm. Attention on ?follow-up imaging in this oncology patient is sufficient for ?surveillance. This recommendation follows 2010 ?ACCF/AHA/AATS/ACR/ASA/SCA/SCAI/SIR/STS/SVM Guidelines for the ?Diagnosis and Management of  Patients with Thoracic Aortic Disease. ?Circulation. 2010; 121: K240-X735. Aortic aneurysm NOS (ICD10-I71.9) ?4.  Aortic Atherosclerosis (ICD10-I70.0). ?  ?  ?IMPRESSION: ?1. No evidence of new intracranial metastases. ?2. Decreased conspicuity of the treated anterior left frontal ?metastasis. ?3. Unchanged appearance of the right frontal resection cavity ?without evidence of local tumor recurrence. ?4. Unchanged right frontal skull metastasis. ?5. Unchanged nonenhancing cortical T2 signal abnormality at the ?right temporoparietal junction which remains indeterminate but could ?reflect a low grade glioma or cortical dysplasia. ?  ?Impression and Plan: ? ?75 year old woman with: ?  ?1.    Kidney cancer diagnosed in 2021.  She presented with stage IV papillary tumor and CNS involvement.   ? ?The natural course of her disease was reviewed at this time and treatment choices were reiterated.  Imaging studies obtained on August 23, 2021 was personally reviewed and showed  no evidence of metastatic disease.  Systemic therapy options including oral targeted therapy, immunotherapy or combination of the above will be deferred at this time. ?  ?2.  Chronic kidney disease: Her creatinine clearance remains relatively stable without any further decline. ?  ?3.  CNS metastasis: No evidence of progression of disease at this time.  MRI of the brain showed no progression of disease. ? ?4.  Anemia: Appears to be multifactorial in nature related due to iron deficiency versus chronic renal insufficiency.  Her hemoglobin close to normal range. ?  ? 5.  Follow-up: She will return in 6 months for repeat evaluation. ?  ?30  minutes were spent on this encounter.  The time was dedicated to reviewing the laboratory data, disease status update and outlining future plan of care discussion. ?  ? ? ? ?Zola Button, MD ?4/11/20238:17 AM ? ?

## 2021-08-31 ENCOUNTER — Telehealth: Payer: Self-pay | Admitting: Internal Medicine

## 2021-08-31 NOTE — Telephone Encounter (Signed)
Scheduled per 4/11 los, pt has been called and confirmed  ?

## 2021-09-01 ENCOUNTER — Ambulatory Visit (INDEPENDENT_AMBULATORY_CARE_PROVIDER_SITE_OTHER): Payer: Medicare Other | Admitting: *Deleted

## 2021-09-01 ENCOUNTER — Ambulatory Visit (INDEPENDENT_AMBULATORY_CARE_PROVIDER_SITE_OTHER): Payer: Medicare Other | Admitting: Student

## 2021-09-01 ENCOUNTER — Other Ambulatory Visit: Payer: Self-pay | Admitting: *Deleted

## 2021-09-01 ENCOUNTER — Encounter: Payer: Self-pay | Admitting: Student

## 2021-09-01 ENCOUNTER — Ambulatory Visit (INDEPENDENT_AMBULATORY_CARE_PROVIDER_SITE_OTHER): Payer: Medicare Other

## 2021-09-01 VITALS — BP 136/76 | HR 69 | Ht 68.0 in | Wt 178.0 lb

## 2021-09-01 DIAGNOSIS — Z7901 Long term (current) use of anticoagulants: Secondary | ICD-10-CM

## 2021-09-01 DIAGNOSIS — Z5181 Encounter for therapeutic drug level monitoring: Secondary | ICD-10-CM

## 2021-09-01 DIAGNOSIS — C7931 Secondary malignant neoplasm of brain: Secondary | ICD-10-CM

## 2021-09-01 DIAGNOSIS — Z952 Presence of prosthetic heart valve: Secondary | ICD-10-CM

## 2021-09-01 DIAGNOSIS — I482 Chronic atrial fibrillation, unspecified: Secondary | ICD-10-CM

## 2021-09-01 DIAGNOSIS — I428 Other cardiomyopathies: Secondary | ICD-10-CM

## 2021-09-01 DIAGNOSIS — I4821 Permanent atrial fibrillation: Secondary | ICD-10-CM | POA: Diagnosis not present

## 2021-09-01 DIAGNOSIS — I2510 Atherosclerotic heart disease of native coronary artery without angina pectoris: Secondary | ICD-10-CM | POA: Diagnosis not present

## 2021-09-01 DIAGNOSIS — Z951 Presence of aortocoronary bypass graft: Secondary | ICD-10-CM | POA: Diagnosis not present

## 2021-09-01 LAB — CUP PACEART INCLINIC DEVICE CHECK
Date Time Interrogation Session: 20230413102741
HighPow Impedance: 75 Ohm
Implantable Lead Implant Date: 20180423
Implantable Lead Implant Date: 20180423
Implantable Lead Implant Date: 20180423
Implantable Lead Location: 753858
Implantable Lead Location: 753859
Implantable Lead Location: 753860
Implantable Lead Model: 292
Implantable Lead Model: 4672
Implantable Lead Model: 7740
Implantable Lead Serial Number: 431793
Implantable Lead Serial Number: 602825
Implantable Lead Serial Number: 703305
Implantable Pulse Generator Implant Date: 20180423
Lead Channel Impedance Value: 1079 Ohm
Lead Channel Impedance Value: 473 Ohm
Lead Channel Impedance Value: 517 Ohm
Lead Channel Sensing Intrinsic Amplitude: 2.4 mV
Lead Channel Sensing Intrinsic Amplitude: 25 mV
Lead Channel Sensing Intrinsic Amplitude: 8.4 mV
Lead Channel Setting Pacing Amplitude: 1.5 V
Lead Channel Setting Pacing Amplitude: 2 V
Lead Channel Setting Pacing Pulse Width: 0.4 ms
Lead Channel Setting Pacing Pulse Width: 0.4 ms
Lead Channel Setting Sensing Sensitivity: 0.6 mV
Lead Channel Setting Sensing Sensitivity: 1 mV
Pulse Gen Serial Number: 174046

## 2021-09-01 LAB — POCT INR: INR: 5 — AB (ref 2.0–3.0)

## 2021-09-01 NOTE — Progress Notes (Signed)
? ? ?Electrophysiology Office Note ?Date: 09/01/2021 ? ?ID:  Jacqueline Palmer, DOB 12-27-1945, MRN [id number] ? ?PCP: Ladell Pier, MD ?Primary Cardiologist: Buford Dresser, MD ?Electrophysiologist: Jacqueline Epley, MD  ? ?CC: Routine ICD follow-up ? ?Jacqueline Palmer is a 76 y.o. female seen today for Jacqueline Epley, MD for routine electrophysiology followup.  Since last being seen in our clinic the patient reports doing very well.  she denies chest pain, palpitations, dyspnea, PND, orthopnea, nausea, vomiting, dizziness, syncope, edema, weight gain, or early satiety. She has not had ICD shocks.  ? ?Device History: ?Boston Scientific BiV ICD implanted 08/2016 for AF, NICM ? ?Past Medical History:  ?Diagnosis Date  ? Acute kidney injury ([place])   ? Arthritis   ? Atrial fibrillation ([place])   ? Cancer [facility])   ? 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 Cardiomyopathy ([place])   ? CHF (congestive heart failure) ([place])   ? Chronic a-fib ([place]) 08/14/2016  ? Chronic anticoagulation 08/14/2016  ? Coronary artery disease   ? 4v CABG 6/17  ? Graves disease   ? Hypertension   ? Hypothyroidism   ? MVR 25m Edwards Perimount Plus Bioprosthetic 11/17/2015  ? SN [id number][From patient's surgical info card]  ? Presence of permanent cardiac pacemaker   ? [place]   ? Thrombus 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 myocardial infarction   ? Thyroid disease   ? ?Past Surgical History:  ?Procedure Laterality Date  ? ABDOMINAL HYSTERECTOMY    ? CORONARY ARTERY BYPASS GRAFT    ? CRANIOTOMY Right 07/30/2019  ? Right frontal  ? NEPHRECTOMY Right 10/16/2019  ? TOTAL HIP ARTHROPLASTY Right 07/11/2021  ? Procedure: RIGHT TOTAL HIP ARTHROPLASTY ANTERIOR APPROACH;  Surgeon: RFrederik Pear MD;  Location: [facility];  Service: Orthopedics;  Laterality: Right;  ? ? ?Current Outpatient Medications  ?Medication Sig Dispense Refill  ? acetaminophen (TYLENOL) 500 MG tablet Take 1,000 mg by mouth in the morning and at bedtime.    ?  colchicine 0.6 MG tablet Take 1 tablet (0.6 mg total) by mouth at bedtime. 90 tablet 3  ? hydrALAZINE (APRESOLINE) 10 MG tablet TAKE ONE TABLET BY MOUTH THREE TIMES DAILY 270 tablet 2  ? levETIRAcetam (KEPPRA) 500 MG tablet Take 1 tablet (500 mg total) by mouth 2 (two) times daily. 180 tablet 3  ? levothyroxine (SYNTHROID) 25 MCG tablet TAKE ONE TABLET BY MOUTH DAILY IN THE MORNING 30-60 MINUTES BEFORE BREAKFAST ON AN EMPTY STOMACH WITH A FULL GLASS OF WATER 90 tablet 0  ? metoprolol succinate (TOPROL-XL) 25 MG 24 hr tablet Take 1 tablet (25 mg total) by mouth daily. 90 tablet 3  ? nitroGLYCERIN (NITROSTAT) 0.4 MG SL tablet Place 1 tablet (0.4 mg total) under the tongue every 5 (five) minutes as needed for chest pain. (Patient not taking: Reported on 08/30/2021) 25 tablet 3  ? OVER THE COUNTER MEDICATION Apply 1 application topically 2 (two) times daily. Pain cream in groin area    ? polyethylene glycol (MIRALAX / GLYCOLAX) 17 g packet Take 17 g by mouth daily as needed for mild constipation.    ? pravastatin (PRAVACHOL) 20 MG tablet Take 20 mg by mouth at bedtime.    ? spironolactone (ALDACTONE) 25 MG tablet TAKE ONE TABLET BY MOUTH ONE TIME DAILY 90 tablet 0  ? torsemide (DEMADEX) 20 MG tablet Take 2 tabs PO Q a.m and one tab PO Q p.m 90 tablet 6  ? traMADol (  ULTRAM) 50 MG tablet Take 50 mg by mouth 2 (two) times daily as needed.    ? warfarin (COUMADIN) 5 MG tablet TAKE ONE TABLET BY MOUTH DAILY IN THE EVENING 90 tablet 0  ? ?No current facility-administered medications for this visit.  ? ? ?Allergies:   Avocado, Banana, Plantain, Ace inhibitors, Codeine, Prednisone, Tape, Latex, and Pharmabase cosmetic [aquamed]  ? ?Social History: ?Social History  ? ?Socioeconomic History  ? Marital status: Widowed  ?  Spouse name: Not on file  ? Number of children: 0  ? Years of education: Doctorate  ? Highest education level: Master's degree (e.g., MA, MS, MEng, MEd, MSW, MBA)  ?Occupational History  ? Occupation: Retired  Clinical biochemist  ?Tobacco Use  ? Smoking status: Former  ?  Types: Cigarettes  ?  Quit date: 07/30/2014  ?  Years since quitting: 7.0  ? Smokeless tobacco: Never  ?Vaping Use  ? Vaping Use: Never used  ?Substance and Sexual Activity  ? Alcohol use: Yes  ?  Comment: 1 glass wine twice weekly  ? Drug use: No  ? Sexual activity: Not on file  ?Other Topics Concern  ? Not on file  ?Social History Narrative  ? Right handed  ? Coffee daily/tea qod  ? ?Social Determinants of Health  ? ?Financial Resource Strain: Not on file  ?Food Insecurity: Not on file  ?Transportation Needs: Not on file  ?Physical Activity: Not on file  ?Stress: Not on file  ?Social Connections: Not on file  ?Intimate Partner Violence: Not on file  ? ? ?Family History: ?Family History  ?Problem Relation Age of Onset  ? Heart attack Mother   ? Heart attack Father   ? Heart attack Sister   ? Breast cancer Neg Hx   ? ? ?Review of Systems: ?All other systems reviewed and are otherwise negative except as noted above. ? ? ?Physical Exam: ?There were no vitals filed for this visit.  ? ?GEN- The patient is well appearing, alert and oriented x 3 today.   ?HEENT: normocephalic, atraumatic; sclera clear, conjunctiva pink; hearing intact; oropharynx clear; neck supple, no JVP ?Lymph- no cervical lymphadenopathy ?Lungs- Clear to ausculation bilaterally, normal work of breathing.  No wheezes, rales, rhonchi ?Heart- Regular rate and rhythm, no murmurs, rubs or gallops, PMI not laterally displaced ?GI- soft, non-tender, non-distended, bowel sounds present, no hepatosplenomegaly ?Extremities- no clubbing or cyanosis. No edema; DP/PT/radial pulses 2+ bilaterally ?MS- no significant deformity or atrophy ?Skin- warm and dry, no rash or lesion; ICD pocket well healed ?Psych- euthymic mood, full affect ?Neuro- strength and sensation are intact ? ?ICD interrogation- reviewed in detail today,  See PACEART report ? ?EKG:  EKG is not ordered today. ?Personal review of EKG ordered   05/30/2021  shows permanent AF / V paced at 70 bpm ? ?Recent Labs: ?08/18/2021: TSH 2.340 ?08/23/2021: ALT 11; BUN 44; Creatinine 2.58; Hemoglobin 11.8; Platelet Count 262; Potassium 4.4; Sodium 137  ? ?Wt Readings from Last 3 Encounters:  ?08/30/21 178 lb 11.2 oz (81.1 kg)  ?08/18/21 180 lb (81.6 kg)  ?07/26/21 187 lb 12.8 oz (85.2 kg)  ?  ? ?Other studies Reviewed: ?Additional studies/ records that were reviewed today include: Previous EP office notes.  ? ?Assessment and Plan: ? ?1.  Chronic systolic dysfunction s/p Boston Scientific CRT-D  ?euvolemic today ?Stable on an appropriate medical regimen ?Normal ICD function ?See Claudia Desanctis Art report ?No changes today ?Her Creatine is technically a contraindication for spironolactone. She will need continued close  monitoring of her potassium.  ?  ?2.  Permanent atrial fibrillation status post AV junction ablation ?Continue Coumadin for her CHA2DS2-VASc of 5 ?  ?3.  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  ?4.  Coronary artery disease status post four-vessel bypass surgery ?Denies s/s ischemia. ? ?Current medicines are reviewed at length with the patient today.   ? ? ?Disposition:   Follow up with Dr. Quentin Ore in 6 months  ? ? ?Signed, ?Shirley Friar, PA-C  ?09/01/2021 ?8:44 AM ? ?[facility] ?[address] ?[address] ?[place] [address] ?[phone] (office) ?[phone] (fax)  ?

## 2021-09-01 NOTE — Patient Instructions (Signed)
Description   ?Hold warfarin today and tomorrow ?Then START taking warfarin 1 tablet daily. Recheck INR in 1 week. Coumadin Clinic [phone] ?  ? ? ?

## 2021-09-01 NOTE — Patient Instructions (Signed)
Medication Instructions:  ?Your physician recommends that you continue on your current medications as directed. Please refer to the Current Medication list given to you today. ? ?*If you need a refill on your cardiac medications before your next appointment, please call your pharmacy* ? ? ?Lab Work: ?None ?If you have labs (blood work) drawn today and your tests are completely normal, you will receive your results only by: ?MyChart Message (if you have MyChart) OR ?A paper copy in the mail ?If you have any lab test that is abnormal or we need to change your treatment, we will call you to review the results. ? ? ?Follow-Up: ?At [facility], you and your health needs are our priority.  As part of our continuing mission to provide you with exceptional heart care, we have created designated Provider Care Teams.  These Care Teams include your primary Cardiologist (physician) and Advanced Practice Providers (APPs -  Physician Assistants and Nurse Practitioners) who all work together to provide you with the care you need, when you need it. ? ? ?Your next appointment:   ?6 month(s) ? ?The format for your next appointment:   ?In Person ? ?Provider:   ?Lars Mage, MD  ? ? ?Important Information About Sugar ? ? ? ? ?  ?

## 2021-09-02 LAB — CUP PACEART REMOTE DEVICE CHECK
Battery Remaining Longevity: 42 mo
Battery Remaining Percentage: 52 %
Brady Statistic RA Percent Paced: 0 %
Brady Statistic RV Percent Paced: 100 %
Date Time Interrogation Session: 20230414000200
HighPow Impedance: 69 Ohm
Implantable Lead Implant Date: 20180423
Implantable Lead Implant Date: 20180423
Implantable Lead Implant Date: 20180423
Implantable Lead Location: 753858
Implantable Lead Location: 753859
Implantable Lead Location: 753860
Implantable Lead Model: 292
Implantable Lead Model: 4672
Implantable Lead Model: 7740
Implantable Lead Serial Number: 431793
Implantable Lead Serial Number: 602825
Implantable Lead Serial Number: 703305
Implantable Pulse Generator Implant Date: 20180423
Lead Channel Impedance Value: 1075 Ohm
Lead Channel Impedance Value: 425 Ohm
Lead Channel Impedance Value: 472 Ohm
Lead Channel Pacing Threshold Amplitude: 0.5 V
Lead Channel Pacing Threshold Amplitude: 0.6 V
Lead Channel Pacing Threshold Pulse Width: 0.4 ms
Lead Channel Pacing Threshold Pulse Width: 0.4 ms
Lead Channel Setting Pacing Amplitude: 1.5 V
Lead Channel Setting Pacing Amplitude: 2 V
Lead Channel Setting Pacing Pulse Width: 0.4 ms
Lead Channel Setting Pacing Pulse Width: 0.4 ms
Lead Channel Setting Sensing Sensitivity: 0.6 mV
Lead Channel Setting Sensing Sensitivity: 1 mV
Pulse Gen Serial Number: 174046

## 2021-09-08 ENCOUNTER — Ambulatory Visit (INDEPENDENT_AMBULATORY_CARE_PROVIDER_SITE_OTHER): Payer: Medicare Other

## 2021-09-08 DIAGNOSIS — I482 Chronic atrial fibrillation, unspecified: Secondary | ICD-10-CM

## 2021-09-08 DIAGNOSIS — Z7901 Long term (current) use of anticoagulants: Secondary | ICD-10-CM

## 2021-09-08 DIAGNOSIS — Z5181 Encounter for therapeutic drug level monitoring: Secondary | ICD-10-CM

## 2021-09-08 LAB — POCT INR: INR: 2.7 (ref 2.0–3.0)

## 2021-09-08 NOTE — Patient Instructions (Signed)
Continue 1 tablet daily. Recheck INR in 4 weeks. Coumadin Clinic [phone] ?

## 2021-09-19 NOTE — Progress Notes (Signed)
Carelink Summary Report / Loop Recorder 

## 2021-10-06 ENCOUNTER — Ambulatory Visit (INDEPENDENT_AMBULATORY_CARE_PROVIDER_SITE_OTHER): Payer: Medicare Other

## 2021-10-06 DIAGNOSIS — I482 Chronic atrial fibrillation, unspecified: Secondary | ICD-10-CM | POA: Diagnosis not present

## 2021-10-06 DIAGNOSIS — Z5181 Encounter for therapeutic drug level monitoring: Secondary | ICD-10-CM

## 2021-10-06 DIAGNOSIS — Z7901 Long term (current) use of anticoagulants: Secondary | ICD-10-CM

## 2021-10-06 LAB — POCT INR: INR: 1.7 — AB (ref 2.0–3.0)

## 2021-10-06 NOTE — Patient Instructions (Signed)
TAKE 2 TABLETS TONIGHT ONLY and then Continue 1 tablet daily. Recheck INR in 5 weeks. Coumadin Clinic [phone]

## 2021-10-12 ENCOUNTER — Encounter: Payer: Self-pay | Admitting: Internal Medicine

## 2021-10-12 NOTE — Progress Notes (Signed)
Patient seen by her nephrologist Dr. Candiss Norse 09/29/2021. Dx: CKD stage IV Creatinine 0.25/GFR 22/intact PTT 120 CBC: H/H12.6/37.5, MCV 75 Iron 114/iron saturation 44%/ferritin 1617

## 2021-10-16 ENCOUNTER — Other Ambulatory Visit: Payer: Self-pay | Admitting: Internal Medicine

## 2021-10-16 DIAGNOSIS — I482 Chronic atrial fibrillation, unspecified: Secondary | ICD-10-CM

## 2021-10-29 ENCOUNTER — Other Ambulatory Visit: Payer: Self-pay | Admitting: Internal Medicine

## 2021-10-29 DIAGNOSIS — E890 Postprocedural hypothyroidism: Secondary | ICD-10-CM

## 2021-10-29 DIAGNOSIS — I482 Chronic atrial fibrillation, unspecified: Secondary | ICD-10-CM

## 2021-10-31 NOTE — Telephone Encounter (Signed)
Have Cardiology refill Coumadin per the practice.

## 2021-11-01 ENCOUNTER — Other Ambulatory Visit: Payer: Self-pay | Admitting: Internal Medicine

## 2021-11-01 DIAGNOSIS — I482 Chronic atrial fibrillation, unspecified: Secondary | ICD-10-CM

## 2021-11-01 NOTE — Telephone Encounter (Signed)
Medication was refused, will refuse this duplicate request. Please have pt cardiologist  refilled medication.  Requested Prescriptions  Pending Prescriptions Disp Refills  . warfarin (COUMADIN) 5 MG tablet [Pharmacy Med Name: Warfarin Sodium Oral Tablet 5 MG] 90 tablet 0    Sig: TAKE ONE TABLET BY MOUTH DAILY IN THE EVENING     Hematology:  Anticoagulants - warfarin Failed - 11/01/2021 12:12 AM      Failed - Manual Review: If patient's warfarin is managed by Anti-Coag team, route request to them. If not, route request to the provider.      Failed - INR in normal range and within 30 days    INR  Date Value Ref Range Status  10/06/2021 1.7 (A) 2.0 - 3.0 Final  07/14/2021 1.4 (H) 0.8 - 1.2 Final    Comment:    (NOTE) INR goal varies based on device and disease states. Performed at [facility], [place] [address], [place], [place] [address]          Passed - HCT in normal range and within 360 days    HCT  Date Value Ref Range Status  08/23/2021 36.4 36.0 - 46.0 % Final   Hematocrit  Date Value Ref Range Status  08/18/2021 39.5 34.0 - 46.6 % Final         Passed - Patient is not pregnant      Passed - Valid encounter within last 3 months    Recent Outpatient Visits          2 months ago Essential hypertension   [place], MD   6 months ago Essential hypertension   [place], MD   7 months ago COVID-19 virus infection   [place], MD   9 months ago Encounter for [employer] annual wellness exam   [place] Lad[place] Pier, MD   10 months ago Essential hypertension   [place] Lad[place] Pier, MD

## 2021-11-02 ENCOUNTER — Other Ambulatory Visit: Payer: Self-pay | Admitting: Internal Medicine

## 2021-11-02 ENCOUNTER — Other Ambulatory Visit: Payer: Self-pay | Admitting: Cardiology

## 2021-11-02 DIAGNOSIS — I482 Chronic atrial fibrillation, unspecified: Secondary | ICD-10-CM

## 2021-11-02 NOTE — Telephone Encounter (Signed)
Please review for refill. Thank you! 

## 2021-11-05 ENCOUNTER — Other Ambulatory Visit: Payer: Self-pay | Admitting: Internal Medicine

## 2021-11-08 ENCOUNTER — Ambulatory Visit (INDEPENDENT_AMBULATORY_CARE_PROVIDER_SITE_OTHER): Payer: Medicare Other | Admitting: *Deleted

## 2021-11-08 DIAGNOSIS — I482 Chronic atrial fibrillation, unspecified: Secondary | ICD-10-CM | POA: Diagnosis not present

## 2021-11-08 DIAGNOSIS — Z7901 Long term (current) use of anticoagulants: Secondary | ICD-10-CM | POA: Diagnosis not present

## 2021-11-08 LAB — POCT INR: INR: 2.3 (ref 2.0–3.0)

## 2021-11-08 NOTE — Patient Instructions (Signed)
Description   Continue taking 1 tablet daily. Recheck INR in 5 weeks. Coumadin Clinic [phone]

## 2021-11-18 ENCOUNTER — Other Ambulatory Visit: Payer: Self-pay | Admitting: Cardiology

## 2021-11-18 NOTE — Telephone Encounter (Signed)
Rx request sent to pharmacy.  

## 2021-11-24 ENCOUNTER — Other Ambulatory Visit: Payer: Self-pay | Admitting: Radiation Therapy

## 2021-11-25 ENCOUNTER — Ambulatory Visit (HOSPITAL_COMMUNITY)
Admission: RE | Admit: 2021-11-25 | Discharge: 2021-11-25 | Disposition: A | Discharge status: Home / Self Care | Payer: Medicare Other | Source: Ambulatory Visit | Attending: Internal Medicine | Admitting: Internal Medicine

## 2021-11-25 DIAGNOSIS — C7931 Secondary malignant neoplasm of brain: Secondary | ICD-10-CM | POA: Insufficient documentation

## 2021-11-25 MED ORDER — GADOBUTROL 1 MMOL/ML IV SOLN
7.0000 mL | Freq: Once | INTRAVENOUS | Status: AC | PRN
  Administered 2021-11-25: 7 mL via INTRAVENOUS

## 2021-11-25 NOTE — Progress Notes (Signed)
Patient here today at [facility] for MRI brain w wo contrast. Patient is SRS protocol and is scanned every 3 months for brain tumor. Patient has Product/process development scientist. Heart Connect with Joey-rep. Orders for VOO 80. Renee-Cardiology PA notified of programming. Will re-program once scan is completed.

## 2021-11-30 LAB — CUP PACEART REMOTE DEVICE CHECK
Battery Remaining Longevity: 42 mo
Battery Remaining Percentage: 47 %
Brady Statistic RA Percent Paced: 0 %
Brady Statistic RV Percent Paced: 100 %
Date Time Interrogation Session: 20230712011300
HighPow Impedance: 71 Ohm
Implantable Lead Implant Date: 20180423
Implantable Lead Implant Date: 20180423
Implantable Lead Implant Date: 20180423
Implantable Lead Location: 753858
Implantable Lead Location: 753859
Implantable Lead Location: 753860
Implantable Lead Model: 292
Implantable Lead Model: 4672
Implantable Lead Model: 7740
Implantable Lead Serial Number: 431793
Implantable Lead Serial Number: 602825
Implantable Lead Serial Number: 703305
Implantable Pulse Generator Implant Date: 20180423
Lead Channel Impedance Value: 1091 Ohm
Lead Channel Impedance Value: 428 Ohm
Lead Channel Impedance Value: 482 Ohm
Lead Channel Pacing Threshold Amplitude: 0.5 V
Lead Channel Pacing Threshold Amplitude: 0.6 V
Lead Channel Pacing Threshold Pulse Width: 0.4 ms
Lead Channel Pacing Threshold Pulse Width: 0.4 ms
Lead Channel Setting Pacing Amplitude: 1.6 V
Lead Channel Setting Pacing Amplitude: 2 V
Lead Channel Setting Pacing Pulse Width: 0.4 ms
Lead Channel Setting Pacing Pulse Width: 0.4 ms
Lead Channel Setting Sensing Sensitivity: 0.6 mV
Lead Channel Setting Sensing Sensitivity: 1 mV
Pulse Gen Serial Number: 174046

## 2021-12-01 ENCOUNTER — Ambulatory Visit (INDEPENDENT_AMBULATORY_CARE_PROVIDER_SITE_OTHER): Payer: Medicare Other

## 2021-12-01 ENCOUNTER — Other Ambulatory Visit: Payer: Self-pay

## 2021-12-01 ENCOUNTER — Inpatient Hospital Stay: Payer: Medicare Other | Attending: Oncology | Admitting: Internal Medicine

## 2021-12-01 VITALS — BP 118/87 | HR 70 | Temp 97.6°F | Resp 17 | Ht 68.0 in | Wt 182.8 lb

## 2021-12-01 DIAGNOSIS — C7931 Secondary malignant neoplasm of brain: Secondary | ICD-10-CM | POA: Diagnosis present

## 2021-12-01 DIAGNOSIS — C649 Malignant neoplasm of unspecified kidney, except renal pelvis: Secondary | ICD-10-CM | POA: Insufficient documentation

## 2021-12-01 DIAGNOSIS — Z79899 Other long term (current) drug therapy: Secondary | ICD-10-CM | POA: Insufficient documentation

## 2021-12-01 DIAGNOSIS — R569 Unspecified convulsions: Secondary | ICD-10-CM

## 2021-12-01 DIAGNOSIS — I428 Other cardiomyopathies: Secondary | ICD-10-CM

## 2021-12-01 NOTE — Progress Notes (Signed)
[place] at [place], [place] [address] [phone]   Interval Evaluation  Date of Service: 12/01/21 Patient Name: Jacqueline Palmer Patient MRN: [id number] Patient DOB: [birth year] Provider: Ventura Sellers, MD  Identifying Statement:  Jacqueline Palmer is a 76 y.o. female with Malignant neoplasm metastatic to brain [facility]) [C79.31]   Primary Cancer: Renal Cell Carcinoma, Stage IV  Oncologic History: 07/30/19: Right frontal craniotomy, resection of solitary brain metastasis at [facility] 10/06/19: Post-operative SRS at [facility] (Dr. Tyrone Nine) 03/09/20: [place] SRS to R frontal+LMD 27/3x 02/08/21: Salvage SRS to L frontal [facility])  Interval History:  Jacqueline Palmer presents today for follow up after recent MRI brain.  No new or progressive complaints today.  She does describe some issues with sleep, new from prior. No recent seizures, continues to take Keppra '500mg'$  twice per day.  Continues to be active mentally and engaging socially.  H+P (02/24/20) Patient presents to review recent changes on brain MRI.  She initially presented in March 2021 with a seizure, described as "left side clenching up, lost awareness for a few minutes".  CNS imaging demonstrated an enhancing right frontal mass, c/w likely metastasis from renal cell carcinoma.  Craniotomy was performed at [facility] and followed with surgical bed radiosurgery at [facility].  She did have one recurrence of seizure activity in late August, at which time she was restarted on Keppra.  Unclear on why this was discontinued.  Recent MRI demonstrated some change and she presents today for treatment recommendations.  No neurologic complaints aside from seizures.  Medications: Current Outpatient Medications on File Prior to Visit  Medication Sig Dispense Refill   acetaminophen (TYLENOL) 500 MG tablet Take 1,000 mg by mouth in the morning and at bedtime.     colchicine 0.6 MG tablet Take 1 tablet (0.6 mg total)  by mouth at bedtime. 90 tablet 3   hydrALAZINE (APRESOLINE) 10 MG tablet TAKE ONE TABLET BY MOUTH THREE TIMES DAILY 270 tablet 2   levETIRAcetam (KEPPRA) 500 MG tablet Take 1 tablet (500 mg total) by mouth 2 (two) times daily. 180 tablet 3   levothyroxine (SYNTHROID) 25 MCG tablet TAKE ONE TABLET BY MOUTH ONE TIME DAILY IN THE MORNING 30-60 MINUTES BEFORE BREAKFAST ON EMPTY STOMACH WITH FULL GLASS OF WATER 90 tablet 0   metoprolol succinate (TOPROL-XL) 25 MG 24 hr tablet Take 1 tablet (25 mg total) by mouth daily. 90 tablet 3   nitroGLYCERIN (NITROSTAT) 0.4 MG SL tablet Place 1 tablet (0.4 mg total) under the tongue every 5 (five) minutes as needed for chest pain. 25 tablet 3   OVER THE COUNTER MEDICATION Apply 1 application topically 2 (two) times daily. Pain cream in groin area     polyethylene glycol (MIRALAX / GLYCOLAX) 17 g packet Take 17 g by mouth daily as needed for mild constipation.     pravastatin (PRAVACHOL) 20 MG tablet Take 1 tablet by mouth once nightly 90 tablet 1   spironolactone (ALDACTONE) 25 MG tablet TAKE ONE TABLET BY MOUTH ONE TIME DAILY 90 tablet 0   torsemide (DEMADEX) 20 MG tablet Take 2 tabs PO Q a.m and one tab PO Q p.m 90 tablet 6   traMADol (ULTRAM) 50 MG tablet Take 50 mg by mouth 2 (two) times daily as needed.     warfarin (COUMADIN) 5 MG tablet Take 1 tablet by mouth once daily in the evening or as directed by Coumadin Clinic 90 tablet 0  No current facility-administered medications on file prior to visit.    Allergies:  Allergies  Allergen Reactions   Avocado Shortness Of Breath    Before 2007   Banana Anaphylaxis and Swelling   Plantain Anaphylaxis and Swelling   Ace Inhibitors Cough   Codeine Other (See Comments)    Nausea and Feels Jittery   Prednisone Swelling    Of face and lower extremities   Tape Hives    Tape  Does better with paper tape    Latex Rash   Pharmabase Cosmetic [Aquamed] Swelling and Rash    Some Soap, perfumes,detergent    Past Medical History:  Past Medical History:  Diagnosis Date   Acute kidney injury ([place])    Arthritis    Atrial fibrillation ([place])    Cancer ([place])    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   Cardiomyopathy ([place])    CHF (congestive heart failure) ([facility])    Chronic a-fib ([place]) 08/14/2016   Chronic anticoagulation 08/14/2016   Coronary artery disease    4v CABG 6/17   Graves disease    Hypertension    Hypothyroidism    MVR 20m Edwards Perimount Plus Bioprosthetic 11/17/2015   SN [id number] [id number] [From patient's surgical info card]   Presence of permanent cardiac pacemaker    Seizures ([place]    Thrombus 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 myocardial infarction    Thyroid disease    Past Surgical History:  Past Surgical History:  Procedure Laterality Date   ABDOMINAL HYSTERECTOMY     CORONARY ARTERY BYPASS GRAFT     CRANIOTOMY Right 07/30/2019   Right frontal   NEPHRECTOMY Right 10/16/2019   TOTAL HIP ARTHROPLASTY Right 07/11/2021   Procedure: RIGHT TOTAL HIP ARTHROPLASTY ANTERIOR APPROACH;  Surgeon: RFrederik Pear MD;  Location: [facility];  Service: Orthopedics;  Laterality: Right;   Social History:  Social History   Socioeconomic History   Marital status: Widowed    Spouse name: Not on file   Number of children: 0   Years of education: Doctorate   Highest education level: Master's degree (e.g., MA, MS, MEng, MEd, MSW, MBA)  Occupational History   Occupation: Retired CClinical biochemist Tobacco Use   Smoking status: Former    Types: Cigarettes    Quit date: 07/30/2014    Years since quitting: 7.3   Smokeless tobacco: Never  Vaping Use   Vaping Use: Never used  Substance and Sexual Activity   Alcohol use: Yes    Comment: 1 glass wine twice weekly   Drug use: No   Sexual activity: Not on file  Other Topics Concern   Not on file  Social History Narrative   Right handed   Coffee daily/tea qod   Social Determinants of Health   Financial Resource Strain: Not on  file  Food Insecurity: Not on file  Transportation Needs: Not on file  Physical Activity: Not on file  Stress: Not on file  Social Connections: Not on file  Intimate Partner Violence: Not on file   Family History:  Family History  Problem Relation Age of Onset   Heart attack Mother    Heart attack Father    Heart attack Sister    Breast cancer Neg Hx     Review of Systems: Constitutional: Doesn't report fevers, chills or abnormal weight loss Eyes: Doesn't report blurriness of vision Ears, nose, mouth, throat, and face: Doesn't report sore throat Respiratory: Doesn't report cough, dyspnea or wheezes Cardiovascular: Doesn't report  palpitation, chest discomfort  Gastrointestinal:  Doesn't report nausea, constipation, diarrhea GU: Doesn't report incontinence Skin: Doesn't report skin rashes Neurological: Per HPI Musculoskeletal: Doesn't report joint pain Behavioral/Psych: Doesn't report anxiety  Physical Exam:    09/01/2021   10:12 AM 08/30/2021    8:29 AM 08/18/2021   11:06 AM  Vitals with BMI  Height '5\' 8"'$  '5\' 8"'$    Weight 178 lbs 178 lbs 11 oz 180 lbs  BMI 10.17 51.02   Systolic 585 277 824  Diastolic 76 75 74  Pulse 69 69 70     KPS: 80. General: Alert, cooperative, pleasant, in no acute distress Head: Normal EENT: No conjunctival injection or scleral icterus.  Lungs: Resp effort normal Cardiac: Regular rate Abdomen: Non-distended abdomen Skin: No rashes cyanosis or petechiae. Extremities: No clubbing or edema  Neurologic Exam: Mental Status: Awake, alert, attentive to examiner. Oriented to self and environment. Language is fluent with intact comprehension.  Cranial Nerves: Visual acuity is grossly normal. Visual fields are full. Extra-ocular movements intact. No ptosis. Face is symmetric Motor: Tone and bulk are normal. Power is full in both arms and legs. Reflexes are symmetric, no pathologic reflexes present.  Sensory: Intact to light touch Gait: Orthopedic  limitations  Labs: I have reviewed the data as listed    Component Value Date/Time   NA 137 08/23/2021 1135   NA 141 12/14/2020 1053   K 4.4 08/23/2021 1135   CL 100 08/23/2021 1135   CO2 28 08/23/2021 1135   GLUCOSE 80 08/23/2021 1135   BUN 44 (H) 08/23/2021 1135   BUN 45 (H) 12/14/2020 1053   CREATININE 2.58 (H) 08/23/2021 1135   CALCIUM 10.1 08/23/2021 1135   PROT 8.0 08/23/2021 1135   ALBUMIN 4.5 08/23/2021 1135   AST 17 08/23/2021 1135   ALT 11 08/23/2021 1135   ALKPHOS 102 08/23/2021 1135   BILITOT 0.7 08/23/2021 1135   GFRNONAA 19 (L) 08/23/2021 1135   GFRAA 30 (L) 01/20/2020 1433   Lab Results  Component Value Date   WBC 6.3 08/23/2021   NEUTROABS 3.0 08/23/2021   HGB 11.8 (L) 08/23/2021   HCT 36.4 08/23/2021   MCV 79.0 (L) 08/23/2021   PLT 262 08/23/2021    Imaging:  [place] Clinician Interpretation: I have personally reviewed the CNS images as listed.  My interpretation, in the context of the patient's clinical presentation, is stable disease  CUP PACEART REMOTE DEVICE CHECK  Result Date: 11/30/2021 Scheduled remote reviewed. Normal device function.  Known permanent AF, on [place] according to previous reports, programmed VVIR Next remote 91 days. Kathy Breach, RN, CCDS, [employer] Protection last programmed on Nov 25, 2021. Beeper is OFF.  MR Brain W Wo Contrast  Result Date: 11/27/2021 CLINICAL DATA:  Brain/CNS neoplasm, monitor. F/U MENINGIOMA RESECTION EXAM: MRI HEAD WITHOUT AND WITH CONTRAST TECHNIQUE: Multiplanar, multiecho pulse sequences of the brain and surrounding structures were obtained without and with intravenous contrast. CONTRAST:  4m GADAVIST GADOBUTROL 1 MMOL/ML IV SOLN COMPARISON:  08/25/2021 FINDINGS: Brain: No acute infarct, mass effect or extra-axial collection. No acute or chronic hemorrhage. 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 Region of hyperintense T2-weighted signal in the parenchyma near the  resection site is unchanged. Small amount of linear enhancement within the resection bed is also unchanged. No residual contrast enhancement within the left frontal lobe. No new enhancing lesions. There is multifocal periventricular white matter hyperintensity, most often a result of chronic microvascular ischemia. Unchanged appearance of  cortical expansion with hyperintense T2-weighted signal at the right temporoparietal junction. The midline structures are normal. Vascular: Major flow voids are preserved. Skull and upper cervical spine: Small focus of contrast enhancement within the right temporoparietal calvarium is unchanged. Sinuses/Orbits:No paranasal sinus fluid levels or advanced mucosal thickening. No mastoid or middle ear effusion. Normal orbits. IMPRESSION: 1. Unchanged appearance of right frontal resection site without recurrent or residual tumor. 2. Unchanged appearance of enhancing lesion of the right temporoparietal calvarium, likely an osseous metastasis. 3. Resolution of treated left frontal metastasis. 4. No new enhancing lesions. 5. 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 Electronically Signed   By: Ulyses Jarred M.D.   On: 11/27/2021 03:41     Assessment/Plan Malignant neoplasm metastatic to brain [facility]) [C79.31]  ZALEIGH BERMINGHAM is clinically and radiographically stable today.  No new or progressive deficits.  Continues on observation with Dr. Alen Blew.    We counseled her on sleep hygeine today.  Ok with melatonin OTC.  Recommended continuing Keppra at '500mg'$  BID.    We appreciate the opportunity to participate in the care of BRUCE MAYERS.    We ask that CARIANA KARGE return to clinic in 4 months following next brain MRI, or sooner as needed.  All questions were answered. The patient knows to call the clinic with any problems, questions or concerns. No  barriers to learning were detected.  The total time spent in the encounter was 30 minutes and more than 50% was on counseling and review of test results   Ventura Sellers, MD Medical Director of Neuro-Oncology [facility] at [place] 12/01/21 10:35 AM

## 2021-12-13 ENCOUNTER — Ambulatory Visit (INDEPENDENT_AMBULATORY_CARE_PROVIDER_SITE_OTHER): Payer: Medicare Other

## 2021-12-13 DIAGNOSIS — I482 Chronic atrial fibrillation, unspecified: Secondary | ICD-10-CM

## 2021-12-13 DIAGNOSIS — Z7901 Long term (current) use of anticoagulants: Secondary | ICD-10-CM | POA: Diagnosis not present

## 2021-12-13 LAB — POCT INR: INR: 1.6 — AB (ref 2.0–3.0)

## 2021-12-13 NOTE — Patient Instructions (Signed)
Description   Take 1.5 tablets today, then resume same dosage of Warfarin 1 tablet daily. Recheck INR in 3 weeks. [facility] [phone]

## 2021-12-14 ENCOUNTER — Other Ambulatory Visit: Payer: Self-pay | Admitting: Internal Medicine

## 2021-12-14 ENCOUNTER — Telehealth: Payer: Self-pay | Admitting: Internal Medicine

## 2021-12-14 NOTE — Telephone Encounter (Signed)
Medication Refill - Medication: hydrALAZINE (APRESOLINE) 10 MG tablet  Has the patient contacted their pharmacy? Yes.   Pt told to contact provider  Preferred Pharmacy (with phone number or street name):  [facility] # [place], [place] Phone:  [phone]  Fax:  [phone]     Has the patient been seen for an appointment in the last year OR does the patient have an upcoming appointment? Yes.    Agent: Please be advised that RX refills may take up to 3 business days. We ask that you follow-up with your pharmacy.

## 2021-12-15 ENCOUNTER — Other Ambulatory Visit: Payer: Self-pay | Admitting: Cardiology

## 2021-12-15 MED ORDER — HYDRALAZINE HCL 10 MG PO TABS: 10.0000 mg | ORAL_TABLET | Freq: Three times a day (TID) | ORAL | 2 refills | Status: DC

## 2021-12-15 NOTE — Telephone Encounter (Signed)
Rx request sent to pharmacy.  

## 2021-12-15 NOTE — Telephone Encounter (Signed)
Requested Prescriptions  Pending Prescriptions Disp Refills  . hydrALAZINE (APRESOLINE) 10 MG tablet 270 tablet 2    Sig: Take 1 tablet (10 mg total) by mouth 3 (three) times daily.     Cardiovascular:  Vasodilators Failed - 12/14/2021  3:49 PM      Failed - HGB in normal range and within 360 days    Hemoglobin  Date Value Ref Range Status  08/23/2021 11.8 (L) 12.0 - 15.0 g/dL Final  08/18/2021 12.4 11.1 - 15.9 g/dL Final         Failed - ANA Screen, Ifa, Serum in normal range and within 360 days    No results found for: "ANA", "ANATITER", "LABANTI"       Passed - HCT in normal range and within 360 days    HCT  Date Value Ref Range Status  08/23/2021 36.4 36.0 - 46.0 % Final   Hematocrit  Date Value Ref Range Status  08/18/2021 39.5 34.0 - 46.6 % Final         Passed - RBC in normal range and within 360 days    RBC  Date Value Ref Range Status  08/23/2021 4.61 3.87 - 5.11 MIL/uL Final         Passed - WBC in normal range and within 360 days    WBC  Date Value Ref Range Status  07/14/2021 9.9 4.0 - 10.5 K/uL Final   WBC Count  Date Value Ref Range Status  08/23/2021 6.3 4.0 - 10.5 K/uL Final         Passed - PLT in normal range and within 360 days    Platelets  Date Value Ref Range Status  08/18/2021 292 150 - 450 x10E3/uL Final   Platelet Count  Date Value Ref Range Status  08/23/2021 262 150 - 400 K/uL Final         Passed - Last BP in normal range    BP Readings from Last 1 Encounters:  12/01/21 118/87         Passed - Valid encounter within last 12 months    Recent Outpatient Visits          3 months ago Essential hypertension   [place], Deborah B, MD   8 months ago Essential hypertension   [place] Ladell Pier, MD   9 months ago COVID-19 virus infection   [place] Ladell Pier, MD   10 months ago Encounter for [employer] annual  wellness exam   [place] Ladell Pier, MD   1 year ago Essential hypertension   [place] Ladell Pier, MD

## 2021-12-16 NOTE — Progress Notes (Signed)
Remote ICD transmission.   

## 2022-01-03 ENCOUNTER — Other Ambulatory Visit: Payer: Self-pay | Admitting: Internal Medicine

## 2022-01-03 ENCOUNTER — Ambulatory Visit (INDEPENDENT_AMBULATORY_CARE_PROVIDER_SITE_OTHER): Payer: Medicare Other

## 2022-01-03 ENCOUNTER — Ambulatory Visit: Payer: Medicare Other | Admitting: Internal Medicine

## 2022-01-03 DIAGNOSIS — I482 Chronic atrial fibrillation, unspecified: Secondary | ICD-10-CM

## 2022-01-03 DIAGNOSIS — Z5181 Encounter for therapeutic drug level monitoring: Secondary | ICD-10-CM

## 2022-01-03 DIAGNOSIS — Z7901 Long term (current) use of anticoagulants: Secondary | ICD-10-CM

## 2022-01-03 LAB — POCT INR: INR: 2 (ref 2.0–3.0)

## 2022-01-03 NOTE — Telephone Encounter (Signed)
Requested Prescriptions  Pending Prescriptions Disp Refills  . colchicine 0.6 MG tablet [Pharmacy Med Name: Colchicine Oral Tablet 0.6 MG] 90 tablet 0    Sig: TAKE ONE TABLET BY MOUTH DAILY AT BEDTIME     Endocrinology:  Gout Agents - colchicine Failed - 01/03/2022  5:05 PM      Failed - Cr in normal range and within 360 days    Creatinine  Date Value Ref Range Status  08/23/2021 2.58 (H) 0.44 - 1.00 mg/dL Final         Failed - CBC within normal limits and completed in the last 12 months    WBC  Date Value Ref Range Status  07/14/2021 9.9 4.0 - 10.5 K/uL Final   WBC Count  Date Value Ref Range Status  08/23/2021 6.3 4.0 - 10.5 K/uL Final   RBC  Date Value Ref Range Status  08/23/2021 4.61 3.87 - 5.11 MIL/uL Final   Hemoglobin  Date Value Ref Range Status  08/23/2021 11.8 (L) 12.0 - 15.0 g/dL Final  08/18/2021 12.4 11.1 - 15.9 g/dL Final   HCT  Date Value Ref Range Status  08/23/2021 36.4 36.0 - 46.0 % Final   Hematocrit  Date Value Ref Range Status  08/18/2021 39.5 34.0 - 46.6 % Final   MCHC  Date Value Ref Range Status  08/23/2021 32.4 30.0 - 36.0 g/dL Final   [facility]  Date Value Ref Range Status  08/23/2021 25.6 (L) 26.0 - 34.0 pg Final   MCV  Date Value Ref Range Status  08/23/2021 79.0 (L) 80.0 - 100.0 fL Final  08/18/2021 82 79 - 97 fL Final   No results found for: "PLTCOUNTKUC", "LABPLAT", "POCPLA" RDW  Date Value Ref Range Status  08/23/2021 15.9 (H) 11.5 - 15.5 % Final  08/18/2021 16.0 (H) 11.7 - 15.4 % Final         Passed - ALT in normal range and within 360 days    ALT  Date Value Ref Range Status  08/23/2021 11 0 - 44 U/L Final         Passed - AST in normal range and within 360 days    AST  Date Value Ref Range Status  08/23/2021 17 15 - 41 U/L Final         Passed - Valid encounter within last 12 months    Recent Outpatient Visits          4 months ago Essential hypertension   [place],  Deborah B, MD   8 months ago Essential hypertension   [place], Deborah B, MD   9 months ago COVID-19 virus infection   [place], Deborah B, MD   11 months ago Encounter for [employer] annual wellness exam   [place], Deborah B, MD   1 year ago Essential hypertension   [place], Deborah B, MD      Future Appointments            In 2 months Ladell Pier, MD [place]

## 2022-01-03 NOTE — Telephone Encounter (Signed)
Pt called, appt scheduled, will refill.

## 2022-01-03 NOTE — Patient Instructions (Signed)
resume same dosage of Warfarin 1 tablet daily. Recheck INR in 6 weeks. [facility] [phone]

## 2022-01-16 ENCOUNTER — Other Ambulatory Visit: Payer: Self-pay | Admitting: Internal Medicine

## 2022-01-16 DIAGNOSIS — I25718 Atherosclerosis of autologous vein coronary artery bypass graft(s) with other forms of angina pectoris: Secondary | ICD-10-CM

## 2022-01-17 NOTE — Telephone Encounter (Signed)
Requested Prescriptions  Pending Prescriptions Disp Refills  . metoprolol succinate (TOPROL-XL) 25 MG 24 hr tablet [Pharmacy Med Name: Metoprolol Succinate ER Oral Tablet Extended Release 24 Hour 25 MG] 90 tablet 0    Sig: TAKE ONE TABLET BY MOUTH ONE TIME DAILY     Cardiovascular:  Beta Blockers Passed - 01/16/2022  3:13 PM      Passed - Last BP in normal range    BP Readings from Last 1 Encounters:  12/01/21 118/87         Passed - Last Heart Rate in normal range    Pulse Readings from Last 1 Encounters:  12/01/21 70         Passed - Valid encounter within last 6 months    Recent Outpatient Visits          5 months ago Essential hypertension   [place], MD   9 months ago Essential hypertension   [place], MD   10 months ago COVID-19 virus infection   [place], MD   11 months ago Encounter for [employer] annual wellness exam   [place], Deborah B, MD   1 year ago Essential hypertension   [place], MD      Future Appointments            In 2 months [name], MD [place]

## 2022-01-27 ENCOUNTER — Other Ambulatory Visit: Payer: Self-pay | Admitting: Cardiology

## 2022-01-27 DIAGNOSIS — I482 Chronic atrial fibrillation, unspecified: Secondary | ICD-10-CM

## 2022-01-27 NOTE — Telephone Encounter (Signed)
Please review for refill. Thank you! 

## 2022-01-27 NOTE — Telephone Encounter (Signed)
Prescription refill request received for warfarin [place]: 09/01/21 [place])  Next INR check: 02/14/22 Warfarin tablet strength: '5mg'$   Appropriate dose and refill sent to requested pharmacy.

## 2022-02-09 ENCOUNTER — Other Ambulatory Visit: Payer: Self-pay | Admitting: Cardiology

## 2022-02-09 ENCOUNTER — Other Ambulatory Visit: Payer: Self-pay | Admitting: Family Medicine

## 2022-02-09 NOTE — Telephone Encounter (Signed)
Rx request sent to pharmacy.  

## 2022-02-14 ENCOUNTER — Ambulatory Visit: Payer: Medicare Other | Attending: Cardiology | Admitting: *Deleted

## 2022-02-14 DIAGNOSIS — I482 Chronic atrial fibrillation, unspecified: Secondary | ICD-10-CM | POA: Diagnosis not present

## 2022-02-14 DIAGNOSIS — Z7901 Long term (current) use of anticoagulants: Secondary | ICD-10-CM | POA: Diagnosis not present

## 2022-02-14 LAB — POCT INR: INR: 2.2 (ref 2.0–3.0)

## 2022-02-14 NOTE — Patient Instructions (Signed)
Description   Continue taking Warfarin 1 tablet daily.  Recheck INR in 6 weeks.  [facility] [phone]      

## 2022-03-01 ENCOUNTER — Other Ambulatory Visit: Payer: Medicare Other

## 2022-03-02 ENCOUNTER — Ambulatory Visit (INDEPENDENT_AMBULATORY_CARE_PROVIDER_SITE_OTHER): Payer: Medicare Other

## 2022-03-02 ENCOUNTER — Ambulatory Visit (HOSPITAL_COMMUNITY)
Admission: RE | Admit: 2022-03-02 | Discharge: 2022-03-02 | Disposition: A | Discharge status: Home / Self Care | Payer: Medicare Other | Source: Ambulatory Visit | Attending: Oncology | Admitting: Oncology

## 2022-03-02 ENCOUNTER — Inpatient Hospital Stay: Payer: Medicare Other | Attending: Oncology

## 2022-03-02 DIAGNOSIS — N2889 Other specified disorders of kidney and ureter: Secondary | ICD-10-CM | POA: Insufficient documentation

## 2022-03-02 DIAGNOSIS — I700 Atherosclerosis of aorta: Secondary | ICD-10-CM | POA: Insufficient documentation

## 2022-03-02 DIAGNOSIS — I428 Other cardiomyopathies: Secondary | ICD-10-CM | POA: Diagnosis not present

## 2022-03-02 DIAGNOSIS — R918 Other nonspecific abnormal finding of lung field: Secondary | ICD-10-CM | POA: Insufficient documentation

## 2022-03-02 DIAGNOSIS — G9389 Other specified disorders of brain: Secondary | ICD-10-CM | POA: Insufficient documentation

## 2022-03-02 DIAGNOSIS — N189 Chronic kidney disease, unspecified: Secondary | ICD-10-CM | POA: Insufficient documentation

## 2022-03-02 DIAGNOSIS — C649 Malignant neoplasm of unspecified kidney, except renal pelvis: Secondary | ICD-10-CM

## 2022-03-02 DIAGNOSIS — J984 Other disorders of lung: Secondary | ICD-10-CM | POA: Insufficient documentation

## 2022-03-02 DIAGNOSIS — Z85528 Personal history of other malignant neoplasm of kidney: Secondary | ICD-10-CM | POA: Insufficient documentation

## 2022-03-02 LAB — CUP PACEART REMOTE DEVICE CHECK
Battery Remaining Longevity: 42 mo
Battery Remaining Percentage: 47 %
Brady Statistic RA Percent Paced: 0 %
Brady Statistic RV Percent Paced: 100 %
Date Time Interrogation Session: 20231012000000
HighPow Impedance: 70 Ohm
Implantable Lead Implant Date: 20180423
Implantable Lead Implant Date: 20180423
Implantable Lead Implant Date: 20180423
Implantable Lead Location: 753858
Implantable Lead Location: 753859
Implantable Lead Location: 753860
Implantable Lead Model: 292
Implantable Lead Model: 4672
Implantable Lead Model: 7740
Implantable Lead Serial Number: 431793
Implantable Lead Serial Number: 602825
Implantable Lead Serial Number: 703305
Implantable Pulse Generator Implant Date: 20180423
Lead Channel Impedance Value: 1065 Ohm
Lead Channel Impedance Value: 363 Ohm
Lead Channel Impedance Value: 469 Ohm
Lead Channel Pacing Threshold Amplitude: 0.6 V
Lead Channel Pacing Threshold Amplitude: 0.7 V
Lead Channel Pacing Threshold Pulse Width: 0.4 ms
Lead Channel Pacing Threshold Pulse Width: 0.4 ms
Lead Channel Setting Pacing Amplitude: 1.6 V
Lead Channel Setting Pacing Amplitude: 2 V
Lead Channel Setting Pacing Pulse Width: 0.4 ms
Lead Channel Setting Pacing Pulse Width: 0.4 ms
Lead Channel Setting Sensing Sensitivity: 0.6 mV
Lead Channel Setting Sensing Sensitivity: 1 mV
Pulse Gen Serial Number: 174046

## 2022-03-02 LAB — CMP (CANCER CENTER ONLY)
ALT: 16 U/L (ref 0–44)
AST: 21 U/L (ref 15–41)
Albumin: 4.6 g/dL (ref 3.5–5.0)
Alkaline Phosphatase: 122 U/L (ref 38–126)
Anion gap: 8 (ref 5–15)
BUN: 54 mg/dL — ABNORMAL HIGH (ref 8–23)
CO2: 28 mmol/L (ref 22–32)
Calcium: 9.7 mg/dL (ref 8.9–10.3)
Chloride: 102 mmol/L (ref 98–111)
Creatinine: 3.07 mg/dL (ref 0.44–1.00)
GFR, Estimated: 15 mL/min — ABNORMAL LOW (ref >60)
Glucose, Bld: 79 mg/dL (ref 70–99)
Potassium: 3.8 mmol/L (ref 3.5–5.1)
Sodium: 138 mmol/L (ref 135–145)
Total Bilirubin: 0.5 mg/dL (ref 0.3–1.2)
Total Protein: 8 g/dL (ref 6.5–8.1)

## 2022-03-02 LAB — CBC WITH DIFFERENTIAL (CANCER CENTER ONLY)
Abs Immature Granulocytes: 0.02 10*3/uL (ref 0.00–0.07)
Basophils Absolute: 0 10*3/uL (ref 0.0–0.1)
Basophils Relative: 0 %
Eosinophils Absolute: 0.2 10*3/uL (ref 0.0–0.5)
Eosinophils Relative: 3 %
HCT: 37.6 % (ref 36.0–46.0)
Hemoglobin: 12.3 g/dL (ref 12.0–15.0)
Immature Granulocytes: 0 %
Lymphocytes Relative: 47 %
Lymphs Abs: 3.2 10*3/uL (ref 0.7–4.0)
MCH: 24.1 pg — ABNORMAL LOW (ref 26.0–34.0)
MCHC: 32.7 g/dL (ref 30.0–36.0)
MCV: 73.7 fL — ABNORMAL LOW (ref 80.0–100.0)
Monocytes Absolute: 0.9 10*3/uL (ref 0.1–1.0)
Monocytes Relative: 13 %
Neutro Abs: 2.6 10*3/uL (ref 1.7–7.7)
Neutrophils Relative %: 37 %
Platelet Count: 254 10*3/uL (ref 150–400)
RBC: 5.1 MIL/uL (ref 3.87–5.11)
RDW: 14.4 % (ref 11.5–15.5)
WBC Count: 6.9 10*3/uL (ref 4.0–10.5)
nRBC: 0 % (ref 0.0–0.2)

## 2022-03-08 ENCOUNTER — Ambulatory Visit: Payer: Medicare Other | Admitting: Oncology

## 2022-03-09 ENCOUNTER — Other Ambulatory Visit: Payer: Self-pay | Admitting: Radiation Therapy

## 2022-03-10 ENCOUNTER — Other Ambulatory Visit: Payer: Self-pay | Admitting: Internal Medicine

## 2022-03-10 NOTE — Telephone Encounter (Signed)
Requested Prescriptions  Pending Prescriptions Disp Refills  . torsemide (DEMADEX) 20 MG tablet [Pharmacy Med Name: Torsemide Oral Tablet 20 MG] 270 tablet 0    Sig: TAKE 2 TABLETS BY MOUTH EVERY MORNING AND 1 TABLET EVERY EVENING     Cardiovascular:  Diuretics - Loop Failed - 03/10/2022  4:46 PM      Failed - Cr in normal range and within 180 days    Creatinine  Date Value Ref Range Status  03/02/2022 3.07 (HH) 0.44 - 1.00 mg/dL Final    Comment:    REPEATED TO VERIFY CRITICAL RESULT CALLED TO, READ BACK BY AND VERIFIED WITH: TAMMI HOLLAND, RN @ [phone] ON 10.12.2023 BY LAUREN BOWMAN, MLS           Failed - Mg Level in normal range and within 180 days    No results found for: "MG"       Failed - Valid encounter within last 6 months    Recent Outpatient Visits          6 months ago Essential hypertension   [place], Deborah B, MD   10 months ago Essential hypertension   [place], Deborah B, MD   1 year ago COVID-19 virus infection   [place], Dalbert Batman, MD   1 year ago Encounter for [employer] annual wellness exam   [place], Deborah B, MD   1 year ago Essential hypertension   [place], MD      Future Appointments            In 1 week Buford Dresser, MD [facility]   In 2 weeks Ladell Pier, MD [place] - K in normal range and within 180 days    Potassium  Date Value Ref Range Status  03/02/2022 3.8 3.5 - 5.1 mmol/L Final         Passed - Ca in normal range and within 180 days    Calcium  Date Value Ref Range Status  03/02/2022 9.7 8.9 - 10.3 mg/dL Final         Passed - Na in normal range and within 180 days    Sodium  Date Value Ref Range Status  03/02/2022  138 135 - 145 mmol/L Final  12/14/2020 141 134 - 144 mmol/L Final         Passed - Cl in normal range and within 180 days    Chloride  Date Value Ref Range Status  03/02/2022 102 98 - 111 mmol/L Final         Passed - Last BP in normal range    BP Readings from Last 1 Encounters:  12/01/21 118/87

## 2022-03-13 NOTE — Progress Notes (Signed)
Remote ICD transmission.   

## 2022-03-14 ENCOUNTER — Inpatient Hospital Stay (HOSPITAL_BASED_OUTPATIENT_CLINIC_OR_DEPARTMENT_OTHER): Payer: Medicare Other | Admitting: Oncology

## 2022-03-14 VITALS — BP 113/79 | HR 70 | Temp 97.8°F | Resp 18 | Ht 68.0 in | Wt 193.1 lb

## 2022-03-14 DIAGNOSIS — C7931 Secondary malignant neoplasm of brain: Secondary | ICD-10-CM | POA: Diagnosis not present

## 2022-03-14 DIAGNOSIS — Z85528 Personal history of other malignant neoplasm of kidney: Secondary | ICD-10-CM | POA: Diagnosis present

## 2022-03-14 DIAGNOSIS — N189 Chronic kidney disease, unspecified: Secondary | ICD-10-CM | POA: Diagnosis not present

## 2022-03-14 DIAGNOSIS — I700 Atherosclerosis of aorta: Secondary | ICD-10-CM | POA: Diagnosis not present

## 2022-03-14 DIAGNOSIS — J984 Other disorders of lung: Secondary | ICD-10-CM | POA: Diagnosis not present

## 2022-03-14 NOTE — Progress Notes (Signed)
Hematology and Oncology Follow Up Visit  Jacqueline Palmer [id number] December 20, 1945 76 y.o. 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  She presented with right kidney tumor and isolated CNS involvement.  Prior Therapy:  He status post right frontal craniotomy and resection of a solitary brain metastasis in March 2021.  He received postoperative SRS at [facility].  She is status post right radical nephrectomy diagnosed on Oct 16, 2019 with a T3a N0 disease.  The final pathology showed undetermined subtype with nuclear grade 3.  She is status post repeat stereotactic radiosurgery under the care of by Dr. Lisbeth Renshaw and Dr. Kathyrn Sheriff on March 03, 2020.  This was completed after she developed a 2.0 cm mass of the right frontal lobe indicating recurrent disease on MRI on February 03, 2020.  She is status post stereotactic radiosurgery under the care of Dr. Lisbeth Renshaw completed on February 08, 2021 for an isolated left frontal lobe detected in September 2022 and MRI.  Current therapy: Active surveillance.  Interim History: [name] returns today for a follow-up.  Since her last visit, she reports no major changes in her health.  She denies any nausea, vomiting or abdominal pain.  She denies any recent hospitalizations or illnesses.  She is ambulatory utilizing a cane without any falls or syncope.     Medications: Updated on review. Current Outpatient Medications  Medication Sig Dispense Refill   acetaminophen (TYLENOL) 500 MG tablet Take 1,000 mg by mouth in the morning and at bedtime.     colchicine 0.6 MG tablet TAKE ONE TABLET BY MOUTH DAILY AT BEDTIME 90 tablet 0   hydrALAZINE (APRESOLINE) 10 MG tablet TAKE ONE TABLET BY MOUTH THREE TIMES DAILY 270 tablet 0   levETIRAcetam (KEPPRA) 500 MG tablet Take 1 tablet (500 mg total) by mouth 2 (two) times  daily. 180 tablet 3   levothyroxine (SYNTHROID) 25 MCG tablet TAKE ONE TABLET BY MOUTH ONE TIME DAILY IN THE MORNING 30-60 MINUTES BEFORE BREAKFAST ON EMPTY STOMACH WITH FULL GLASS OF WATER 90 tablet 0   metoprolol succinate (TOPROL-XL) 25 MG 24 hr tablet TAKE ONE TABLET BY MOUTH ONE TIME DAILY 90 tablet 0   nitroGLYCERIN (NITROSTAT) 0.4 MG SL tablet Place 1 tablet (0.4 mg total) under the tongue every 5 (five) minutes as needed for chest pain. (Patient not taking: Reported on 12/01/2021) 25 tablet 3   OVER THE COUNTER MEDICATION Apply 1 application topically 2 (two) times daily. Pain cream in groin area     polyethylene glycol (MIRALAX / GLYCOLAX) 17 g packet Take 17 g by mouth daily as needed for mild constipation.     pravastatin (PRAVACHOL) 20 MG tablet Take 1 tablet by mouth once nightly 90 tablet 1   spironolactone (ALDACTONE) 25 MG tablet TAKE ONE TABLET BY MOUTH ONE TIME DAILY 90 tablet 1   torsemide (DEMADEX) 20 MG tablet TAKE 2 TABLETS BY MOUTH EVERY MORNING AND 1 TABLET EVERY EVENING 270 tablet 0   warfarin (COUMADIN) 5 MG tablet TAKE 1 TABLET BY MOUTH ONCE DAILY IN THE EVENING OR AS DIRECTED BY COUMADIN CLINIC 90 tablet 0   No current facility-administered medications for this visit.     Allergies:  Allergies  Allergen Reactions   Avocado Shortness Of Breath    Before 2007   Banana Anaphylaxis and Swelling   Plantain Anaphylaxis and Swelling   Ace  Inhibitors Cough   Codeine Other (See Comments)    Nausea and Feels Jittery   Prednisone Swelling    Of face and lower extremities   Tape Hives    Tape  Does better with paper tape    Latex Rash   Pharm[employer] Cosmetic [[place]] Swelling and Rash    Some Soap, perfumes,detergent      Physical Exam:   Blood pressure 113/79, pulse 70, temperature 97.8 F (36.6 C), temperature source Temporal, resp. rate 18, height '5\' 8"'$  (1.727 m), weight 193 lb 1.6 oz (87.6 kg), SpO2 97 %.     ECOG: 1   General appearance: Alert,  awake without any distress. Head: Atraumatic without abnormalities Oropharynx: Without any thrush or ulcers. Eyes: No scleral icterus. Lymph nodes: No lymphadenopathy noted in the cervical, supraclavicular, or axillary nodes Heart:regular rate and rhythm, without any murmurs or gallops.   Lung: Clear to auscultation without any rhonchi, wheezes or dullness to percussion. Abdomin: Soft, nontender without any shifting dullness or ascites. Musculoskeletal: No clubbing or cyanosis. Neurological: No motor or sensory deficits. Skin: No rashes or lesions.         Lab Results: Lab Results  Component Value Date   WBC 6.9 03/02/2022   HGB 12.3 03/02/2022   HCT 37.6 03/02/2022   MCV 73.7 (L) 03/02/2022   PLT 254 03/02/2022     Chemistry      Component Value Date/Time   NA 138 03/02/2022 0909   NA 141 12/14/2020 1053   K 3.8 03/02/2022 0909   CL 102 03/02/2022 0909   CO2 28 03/02/2022 0909   BUN 54 (H) 03/02/2022 0909   BUN 45 (H) 12/14/2020 1053   CREATININE 3.07 (HH) 03/02/2022 0909      Component Value Date/Time   CALCIUM 9.7 03/02/2022 0909   ALKPHOS 122 03/02/2022 0909   AST 21 03/02/2022 0909   ALT 16 03/02/2022 0909   BILITOT 0.5 03/02/2022 0909     IMPRESSION: 1. Stable CTS of the chest, abdomen and pelvis status post right nephrectomy. 2. No evidence of local recurrence or metastatic disease. 3. Stable scattered pulmonary scarring and tiny nodules consistent with benign findings based on stability. 4.  Aortic Atherosclerosis (ICD10-I70.0).      Impression and Plan:  76 year old woman with:   1.   Stage IV papillary kidney cancer diagnosed in 2021.  She presented with isolated CNS mass.  She is currently on active surveillance without any evidence of disease relapse.  I recommended no active treatment at this time unless she developed relapsed disease.  Oral targeted therapy, immunotherapy or combination of the two would be utilized if she developed  recurrent disease.  Treating isolated metastatic disease with surgical resection or radiation could also be considered.      2.  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 Mickeal Skinner.  No evidence of relapsed disease based on last MRI.  3.  Anemia: Resolved with her hemoglobin is back to normal range.  She has element of chronic renal insufficiency which could contribute to her anemia.    4.  Follow-up: In 6 months for a follow-up.   30  minutes were dedicated to this visit.  The time was spent on reviewing laboratory data, disease status update and outlining future plan of care discussion.    Zola Button, MD 10/24/20238:29 AM

## 2022-03-17 ENCOUNTER — Encounter (HOSPITAL_BASED_OUTPATIENT_CLINIC_OR_DEPARTMENT_OTHER): Payer: Self-pay | Admitting: Cardiology

## 2022-03-17 ENCOUNTER — Ambulatory Visit (INDEPENDENT_AMBULATORY_CARE_PROVIDER_SITE_OTHER): Payer: Medicare Other | Admitting: Cardiology

## 2022-03-17 ENCOUNTER — Other Ambulatory Visit (HOSPITAL_BASED_OUTPATIENT_CLINIC_OR_DEPARTMENT_OTHER): Payer: Self-pay

## 2022-03-17 VITALS — BP 112/70 | HR 70 | Ht 68.0 in | Wt 191.0 lb

## 2022-03-17 DIAGNOSIS — I4821 Permanent atrial fibrillation: Secondary | ICD-10-CM

## 2022-03-17 DIAGNOSIS — Z7901 Long term (current) use of anticoagulants: Secondary | ICD-10-CM

## 2022-03-17 DIAGNOSIS — Z95810 Presence of automatic (implantable) cardiac defibrillator: Secondary | ICD-10-CM | POA: Diagnosis not present

## 2022-03-17 DIAGNOSIS — I7121 Aneurysm of the ascending aorta, without rupture: Secondary | ICD-10-CM

## 2022-03-17 DIAGNOSIS — Z952 Presence of prosthetic heart valve: Secondary | ICD-10-CM

## 2022-03-17 DIAGNOSIS — I5032 Chronic diastolic (congestive) heart failure: Secondary | ICD-10-CM | POA: Diagnosis not present

## 2022-03-17 DIAGNOSIS — Z951 Presence of aortocoronary bypass graft: Secondary | ICD-10-CM

## 2022-03-17 DIAGNOSIS — I2510 Atherosclerotic heart disease of native coronary artery without angina pectoris: Secondary | ICD-10-CM | POA: Diagnosis not present

## 2022-03-17 MED ORDER — COMIRNATY 30 MCG/0.3ML IM SUSY
PREFILLED_SYRINGE | INTRAMUSCULAR | 0 refills | Status: DC
  Filled 2022-03-17: qty 0.3, 1d supply, fill #0

## 2022-03-17 NOTE — Patient Instructions (Signed)
Medication Instructions:  Your Physician recommend you continue on your current medication as directed.    *If you need a refill on your cardiac medications before your next appointment, please call your pharmacy*   Lab Work: None ordered today   Testing/Procedures: None ordered today   Follow-Up: At [facility], you and your health needs are our priority.  As part of our continuing mission to provide you with exceptional heart care, we have created designated Provider Care Teams.  These Care Teams include your primary Cardiologist (physician) and Advanced Practice Providers (APPs -  Physician Assistants and Nurse Practitioners) who all work together to provide you with the care you need, when you need it.  We recommend signing up for the patient portal called "MyChart".  Sign up information is provided on this After Visit Summary.  MyChart is used to connect with patients for Virtual Visits (Telemedicine).  Patients are able to view lab/test results, encounter notes, upcoming appointments, etc.  Non-urgent messages can be sent to your provider as well.   To learn more about what you can do with MyChart, go to [email or web address].    Your next appointment:   6 month(s)  The format for your next appointment:   In Person  Provider:   Buford Dresser, MD

## 2022-03-17 NOTE — Progress Notes (Signed)
Cardiology Office Note:    Date:  03/17/2022   ID:  Jacqueline Palmer, DOB 1946/02/02, MRN [id number]  PCP:  Ladell Pier, MD  Cardiologist:  Buford Dresser, MD  Referring MD: Ladell Pier, MD   CC: follow up  History of Present Illness:    Jacqueline Palmer  [facility]) is a 76 y.o. female with a hx of hypertension, CAD s/p 4V CABG, MAZE, LAA ligation, bioprosthetic MVR (for rheumatic heart disease) 11/17/2015, permanent atrial fibrillation, chronic diastolic heart failure with recovered systolic dysfunction, hypothyroidism, CKD stage 4, metastatic renal cancer s/p radical nephrectomy and craniotomy for resection of brain tumor who is seen as a follow up today. I initially met her 02/19/20 new consult at the request of Ladell Pier, MD for the evaluation and management of multiple cardiovascular conditions.  CV history: -Previously followed by Dr. Westley Gambles at [facility] for BiV-ICD. She is 100% BiV paced. Had one episode of sustained VT terminated by ATP. Has permanent atrial fibrillation, on coumadin. Appears she had AV node ablation 09/11/2016 at [facility] based on Ernest. -Admitted for HF at [facility] 10/2019, 11/2019.  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 Follows with Dr. Alen Blew in medical oncology and Dr. Mickeal Skinner in neuro oncology. Follows at [facility].  At her last visit, she was doing well from a CV perspective. She was recovering and attending PT after a THA. She noted some occasional bilateral ankle swelling since reducing her Torsemide. She had an episodes of chest pain which she attributed to indigestion.  Today, she has been doing well. She notes that her surgical scar on her chest has been itching. She was given a topical cream which she uses with relief. She checks her ankles every morning and has not seen any BLE swelling.  She denies any bleeding or other adverse effects from the Coumadin.  She is still recovering  from her recent hip surgery and has seen her activity level gradually improving. For pain relief, she has been taking Tylenol BID and using Biofreeze.   She denies any palpitations, chest pain, or shortness of breath. No lightheadedness, headaches, syncope, orthopnea, or PND.   Past Medical History:  Diagnosis Date   Acute kidney injury ([place])    Arthritis    Atrial fibrillation ([place])    Cancer (HCC)    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   Cardiomyopathy [facility])    CHF (congestive heart failure) (HCC)    Chronic a-fib ([place]) 08/14/2016   Chronic anticoagulation 08/14/2016   Coronary artery disease    4v CABG 6/17   Graves disease    Hypertension    Hypothyroidism    MVR 41m Edwards Perimount Plus Bioprosthetic 11/17/2015   SN [id number] [From patient's surgical info card]   Presence of permanent cardiac pacemaker    Seizures ([place]    Thrombus 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 myocardial infarction    Thyroid disease     Past Surgical History:  Procedure Laterality Date   ABDOMINAL HYSTERECTOMY     CORONARY ARTERY BYPASS GRAFT     CRANIOTOMY Right 07/30/2019   Right frontal   NEPHRECTOMY Right 10/16/2019   TOTAL HIP ARTHROPLASTY Right 07/11/2021   Procedure: RIGHT TOTAL HIP ARTHROPLASTY ANTERIOR APPROACH;  Surgeon: RFrederik Pear MD;  Location: [facility]S;  Service: Orthopedics;  Laterality: Right;    Current Medications: Current Outpatient Medications on File Prior to Visit  Medication Sig  acetaminophen (TYLENOL) 500 MG tablet Take 1,000 mg by mouth in the morning and at bedtime.   colchicine 0.6 MG tablet TAKE ONE TABLET BY MOUTH DAILY AT BEDTIME   hydrALAZINE (APRESOLINE) 10 MG tablet TAKE ONE TABLET BY MOUTH THREE TIMES DAILY   levETIRAcetam (KEPPRA) 500 MG tablet Take 1 tablet (500 mg total) by mouth 2 (two) times daily.   levothyroxine (SYNTHROID) 25 MCG tablet TAKE ONE TABLET BY MOUTH ONE TIME DAILY IN THE MORNING 30-60 MINUTES BEFORE BREAKFAST ON  EMPTY STOMACH WITH FULL GLASS OF WATER   metoprolol succinate (TOPROL-XL) 25 MG 24 hr tablet TAKE ONE TABLET BY MOUTH ONE TIME DAILY   nitroGLYCERIN (NITROSTAT) 0.4 MG SL tablet Place 1 tablet (0.4 mg total) under the tongue every 5 (five) minutes as needed for chest pain.   OVER THE COUNTER MEDICATION Apply 1 application topically 2 (two) times daily. Pain cream in groin area   polyethylene glycol (MIRALAX / GLYCOLAX) 17 g packet Take 17 g by mouth daily as needed for mild constipation.   pravastatin (PRAVACHOL) 20 MG tablet Take 1 tablet by mouth once nightly   spironolactone (ALDACTONE) 25 MG tablet TAKE ONE TABLET BY MOUTH ONE TIME DAILY   torsemide (DEMADEX) 20 MG tablet TAKE 2 TABLETS BY MOUTH EVERY MORNING AND 1 TABLET EVERY EVENING   warfarin (COUMADIN) 5 MG tablet TAKE 1 TABLET BY MOUTH ONCE DAILY IN THE EVENING OR AS DIRECTED BY COUMADIN CLINIC   No current facility-administered medications on file prior to visit.     Allergies:   Avocado, Banana, Plantain, Ace inhibitors, Codeine, Prednisone, Tape, Latex, and Pharmabase cosmetic [aquamed]   Social History   Tobacco Use   Smoking status: Former    Types: Cigarettes    Quit date: 07/30/2014    Years since quitting: 7.6   Smokeless tobacco: Never  Vaping Use   Vaping Use: Never used  Substance Use Topics   Alcohol use: Yes    Comment: 1 glass wine twice weekly   Drug use: No    Family History: family history includes Heart attack in her father, mother, and sister. There is no history of Breast cancer.  ROS:   Please see the history of present illness.   Additional pertinent ROS otherwise unremarkable.    EKGs/Labs/Other Studies Reviewed:    The following studies were reviewed today:  Echo 12/03/20 1. Left ventricular ejection fraction, by estimation, is 60 to 65%. The  left ventricle has normal function. The left ventricle has no regional  wall motion abnormalities. Left ventricular diastolic function could not  be  evaluated.   2. Right ventricular systolic function is normal. The right ventricular  size is not well visualized. There is normal pulmonary artery systolic  pressure. The estimated right ventricular systolic pressure is 06.2 mmHg.   3. Left atrial size was moderately dilated.   4. Right atrial size was mildly dilated.   5. The mitral valve has been repaired/replaced. No evidence of mitral  valve regurgitation. The mean mitral valve gradient is 3.0 mmHg. There is  a present in the mitral position.   6. The aortic valve is tricuspid. Aortic valve regurgitation is trivial.  No aortic stenosis is present.   7. Aortic dilatation noted. There is mild dilatation of the ascending  aorta, measuring 42 mm.   8. The inferior vena cava is normal in size with greater than 50%  respiratory variability, suggesting right atrial pressure of 3 mmHg.   CIED follow up 02/10/20 (  [facility]) Stable BSC VVIR CRTD battery (5.56yr) function and lead trends.  BIVpacing=100% (CHB; PM DEPENDENT).  Two NSVT episodes.  One sustained VT espiode (avg=205bpm) terminated with one burst of ATP.  Permanent AFib.  OAC=warfarin  Echo 11/03/19 ([facility])   NORMAL LEFT VENTRICULAR FUNCTION WITH MODERATE LVH    MILD RV SYSTOLIC DYSFUNCTION (See above)    VALVULAR REGURGITATION: MILD AR, TRIVIAL MR, MILD PR    PROSTHETIC VALVE(S): BIOPROSTHETIC MV    Compared with prior Echo study on 06/27/2019: TR NOT AS WELL VISUALIZED ON    TODAY'S EXAM DUE TO PATIENT DISCOMFORT.    TR REVERSAL STILL NOTED IN THE HEPATIC VEINS, C/W SEVERE TR   TEE 09/01/2016 ([facility])   Limited study to assess for LA/LAA thrombus pre-ablation/DCCV    1. Small amount of laminar thrombus in tip of LAA, smaller than prior TEE    (02/04/16)    2. Definity used demonstrates flow communication into LAA despite previous    ligation attempt and confirms thrombus in tip of LAA    3. No RA/RAA thrombus    4. Severe TR    EKG:  EKG is personally reviewed.   03/04/20 atypical  flutter with biventricular pacing  Recent Labs: 08/18/2021: TSH 2.340 03/02/2022: ALT 16; BUN 54; Creatinine 3.07; Hemoglobin 12.3; Platelet Count 254; Potassium 3.8; Sodium 138  Recent Lipid Panel No results found for: "CHOL", "TRIG", "HDL", "CHOLHDL", "VLDL", "LDLCALC", "LDLDIRECT"  Physical Exam:    VS:  BP 112/70   Pulse 70   Ht '5\' 8"'  (1.727 m)   Wt 191 lb (86.6 kg)   BMI 29.04 kg/m     Wt Readings from Last 3 Encounters:  03/17/22 191 lb (86.6 kg)  03/14/22 193 lb 1.6 oz (87.6 kg)  12/01/21 182 lb 12.8 oz (82.9 kg)    GEN: Well nourished, well developed in no acute distress HEENT: Normal, moist mucous membranes NECK: No JVD CARDIAC: regular rhythm, normal S1 and S2, no rubs or gallops. No murmurs. VASCULAR: Radial and DP pulses 2+ bilaterally. No carotid bruits RESPIRATORY:  Clear to auscultation without rales, wheezing or rhonchi  ABDOMEN: Soft, non-tender, non-distended MUSCULOSKELETAL:  Ambulates independently but uses a walker SKIN: Warm and dry, trivial bilateral LE edema primarily in the ankles NEUROLOGIC:  Alert and oriented x 3. No focal neuro deficits noted. PSYCHIATRIC:  Normal affect    ASSESSMENT:    1. Permanent atrial fibrillation ([place]   2. Biventricular automatic implantable cardioverter defibrillator in situ   3. Chronic diastolic heart failure ([place]   4. Hx of CABG   5. S/P MVR (mitral valve replacement)   6. Chronic anticoagulation   7. Aneurysm of ascending aorta without rupture (HCC)    PLAN:    Chronic diastolic heart failure -reports no worsening clinical symptoms today -tolerating torsemide and spironolactone -if indications for SGLT2i and GFR change, re-evaluate for SGLT2i, though with single kidney would clear with nephrology prior to starting  Permanent atrial fib/flutter, s/p AV node ablation Sustained VT requiring ATP -CHA2DS2/VAS Stroke Risk Points=5  -[facility] and [facility] -on  coumadin, established with coumadin clinic -has BiV-ICD in place, followed by Dr. LQuentin Ore CAD s/p 4V CABG Rheumatic MV disease s/p MVR Hypercholesterolemia, goal LDL <70 Thoracic aortic aneurysm, last 42 mm 11/2020 (echo) -rare angina, has not required NG in a long time -no aspirin as she is on coumadin -on pravastatin, tolerating -on metoprolol succinate as antianginal -has mildly dilated  TAA, 42 mm. Monitor.  CKD stage 4, s/p right radical nephrectomy  History of stage 4 renal cell carcinoma with brain metastases Hypertension -last GFR 19, consistent with stage 4 CKD -on spironolactone, torsemide for volume management -on hydralazine for BP control -if needed, could add imdur or change to carvedilol  -no ACEi/ARB given single kidney and chronic kidney disease   Cardiac risk counseling and prevention recommendations: -recommend heart healthy/Mediterranean diet, with whole grains, fruits, vegetable, fish, lean meats, nuts, and olive oil. Limit salt. -recommend moderate walking, 3-5 times/week for 30-50 minutes each session. Aim for at least 150 minutes.week. Goal should be pace of 3 miles/hours, or walking 1.5 miles in 30 minutes -recommend avoidance of tobacco products. Avoid excess alcohol.  Plan for follow up: 52-month  BBuford Dresser MD, PhD, [facility]   Medication Adjustments/Labs and Tests Ordered: Current medicines are reviewed at length with the patient today.  Concerns regarding medicines are outlined above.  No orders of the defined types were placed in this encounter.  No orders of the defined types were placed in this encounter.  Patient Instructions  Medication Instructions:  Your Physician recommend you continue on your current medication as directed.    *If you need a refill on your cardiac medications before your next appointment, please call your pharmacy*   Lab Work: None ordered today   Testing/Procedures: None  ordered today   Follow-Up: At [facility] you and your health needs are our priority.  As part of our continuing mission to provide you with exceptional heart care, we have created designated Provider Care Teams.  These Care Teams include your primary Cardiologist (physician) and Advanced Practice Providers (APPs -  Physician Assistants and Nurse Practitioners) who all work together to provide you with the care you need, when you need it.  We recommend signing up for the patient portal called "MyChart".  Sign up information is provided on this After Visit Summary.  MyChart is used to connect with patients for Virtual Visits (Telemedicine).  Patients are able to view lab/test results, encounter notes, upcoming appointments, etc.  Non-urgent messages can be sent to your provider as well.   To learn more about what you can do with MyChart, go to [email or web address]    Your next appointment:   6 month(s)  The format for your next appointment:   In Person  Provider:   BBuford Dresser MD             I,Alexis Herring,acting as a scribe for BBuford Dresser MD.,have documented all relevant documentation on the behalf of BBuford Dresser MD,as directed by  BBuford Dresser MD while in the presence of BBuford Dresser MD.  I, BBuford Dresser MD, have reviewed all documentation for this visit. The documentation on 06/19/22 for the exam, diagnosis, procedures, and orders are all accurate and complete.   Signed, BBuford Dresser MD PhD 03/17/2022 2:04 PM    [place]

## 2022-03-18 ENCOUNTER — Other Ambulatory Visit: Payer: Self-pay | Admitting: Internal Medicine

## 2022-03-26 ENCOUNTER — Other Ambulatory Visit: Payer: Self-pay | Admitting: Speech Pathology

## 2022-03-26 DIAGNOSIS — R1011 Right upper quadrant pain: Secondary | ICD-10-CM

## 2022-03-26 DIAGNOSIS — R1084 Generalized abdominal pain: Secondary | ICD-10-CM

## 2022-03-28 ENCOUNTER — Other Ambulatory Visit: Payer: Self-pay

## 2022-03-28 ENCOUNTER — Ambulatory Visit: Payer: Medicare Other | Attending: Internal Medicine | Admitting: Internal Medicine

## 2022-03-28 ENCOUNTER — Encounter: Payer: Self-pay | Admitting: Internal Medicine

## 2022-03-28 VITALS — BP 120/78 | HR 52 | Temp 97.6°F | Ht 68.0 in | Wt 189.0 lb

## 2022-03-28 DIAGNOSIS — Z85528 Personal history of other malignant neoplasm of kidney: Secondary | ICD-10-CM | POA: Insufficient documentation

## 2022-03-28 DIAGNOSIS — I2510 Atherosclerotic heart disease of native coronary artery without angina pectoris: Secondary | ICD-10-CM | POA: Insufficient documentation

## 2022-03-28 DIAGNOSIS — I4821 Permanent atrial fibrillation: Secondary | ICD-10-CM | POA: Diagnosis not present

## 2022-03-28 DIAGNOSIS — Z95810 Presence of automatic (implantable) cardiac defibrillator: Secondary | ICD-10-CM | POA: Diagnosis not present

## 2022-03-28 DIAGNOSIS — I482 Chronic atrial fibrillation, unspecified: Secondary | ICD-10-CM

## 2022-03-28 DIAGNOSIS — I130 Hypertensive heart and chronic kidney disease with heart failure and stage 1 through stage 4 chronic kidney disease, or unspecified chronic kidney disease: Secondary | ICD-10-CM | POA: Diagnosis present

## 2022-03-28 DIAGNOSIS — E890 Postprocedural hypothyroidism: Secondary | ICD-10-CM | POA: Diagnosis not present

## 2022-03-28 DIAGNOSIS — I10 Essential (primary) hypertension: Secondary | ICD-10-CM | POA: Diagnosis not present

## 2022-03-28 DIAGNOSIS — E039 Hypothyroidism, unspecified: Secondary | ICD-10-CM | POA: Diagnosis not present

## 2022-03-28 DIAGNOSIS — I428 Other cardiomyopathies: Secondary | ICD-10-CM | POA: Insufficient documentation

## 2022-03-28 DIAGNOSIS — N184 Chronic kidney disease, stage 4 (severe): Secondary | ICD-10-CM | POA: Diagnosis not present

## 2022-03-28 DIAGNOSIS — Z952 Presence of prosthetic heart valve: Secondary | ICD-10-CM | POA: Diagnosis not present

## 2022-03-28 DIAGNOSIS — Z79899 Other long term (current) drug therapy: Secondary | ICD-10-CM | POA: Insufficient documentation

## 2022-03-28 DIAGNOSIS — Z79890 Hormone replacement therapy (postmenopausal): Secondary | ICD-10-CM | POA: Insufficient documentation

## 2022-03-28 DIAGNOSIS — Z7901 Long term (current) use of anticoagulants: Secondary | ICD-10-CM | POA: Insufficient documentation

## 2022-03-28 DIAGNOSIS — Z923 Personal history of irradiation: Secondary | ICD-10-CM | POA: Insufficient documentation

## 2022-03-28 DIAGNOSIS — Z2911 Encounter for prophylactic immunotherapy for respiratory syncytial virus (RSV): Secondary | ICD-10-CM

## 2022-03-28 DIAGNOSIS — I5032 Chronic diastolic (congestive) heart failure: Secondary | ICD-10-CM | POA: Diagnosis not present

## 2022-03-28 DIAGNOSIS — F5104 Psychophysiologic insomnia: Secondary | ICD-10-CM

## 2022-03-28 MED ORDER — RSVPREF3 VAC RECOMB ADJUVANTED 120 MCG/0.5ML IM SUSR
0.5000 mL | Freq: Once | INTRAMUSCULAR | 0 refills | Status: AC
  Filled 2022-03-28: qty 0.5, 1d supply, fill #0

## 2022-03-28 MED ORDER — HYDRALAZINE HCL 10 MG PO TABS
10.0000 mg | ORAL_TABLET | Freq: Three times a day (TID) | ORAL | 2 refills | Status: DC
Start: 2022-03-28 — End: 2022-12-14

## 2022-03-28 MED ORDER — METOPROLOL SUCCINATE ER 25 MG PO TB24
25.0000 mg | ORAL_TABLET | Freq: Every day | ORAL | 1 refills | Status: DC
Start: 2022-03-28 — End: 2022-10-18

## 2022-03-28 MED ORDER — LEVOTHYROXINE SODIUM 25 MCG PO TABS: ORAL_TABLET | ORAL | 1 refills | Status: DC

## 2022-03-28 NOTE — Progress Notes (Signed)
Patient ID: Jacqueline Palmer, female    DOB: January 18, 1946  MRN: [id number]  CC: Hypertension (HTN f/u.  Orion Crook pneumonia vax. Trouble sleeping, interested in a drug free sleep aid. [name] received flu vax this season.)   Subjective: Jacqueline Palmer is a 76 y.o. female who presents for chronic ds management Her concerns today include:  Patient with history of CAD with history CABGx4, chronic diastolic CHF/NICM with BV-ICD, HTN, rheumatic heart disease status post MVR replacement, permanent A. 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     Has problems falling asleep Takes 1-1.5 hr to fall asleep;  Gets in bed around 11:30 p.m-12 a.m.  She sleeps with a small night light on but otherwise all lights and sounds are off.  She does not drink any caffeinated beverages at night. Sleeps late when she does not have appt Melatonin does not help.  She wants to explore other non-prescription options What about CBD gummy?  Reports being told by a friend that it helps her with sleep.  HTN/CAD/atrial fibrillation: She denies any chest pains or shortness of breath, LE edema.  No bruising on bleeding on Coumadin.  Followed by Coumadin clinic through cardiology. Reports compliance with taking her medications which includes Coumadin, hydralazine, spironolactone, torsemide, and pravastatin. Had a mild anemia that resolved based on last CBC done about a month ago.   Hypothyroid: Reports compliance with taking levothyroxine 25 mcg daily.  Due for thyroid level check.  CKD 4: Last GFR was at 15.  She is followed by nephrology Dr. Candiss Norse.  Patient has cane with her today.  I asked her about whether she still has to use the cane given that she has had hip surgery earlier this year.  She states that she does not use it in her apartment but always takes it with  her for comfort when she is away from the house.  She has not had any falls. Reports good appetite.  HM:  had flu shot [facility] mth [facility]; receive COVID booster few wks later.  Needs RSV.  Her insurance does not cover Shingrix  Patient Active Problem List   Diagnosis Date Noted   Avascular necrosis of bone of right hip ([place]) 07/11/2021   Advance directive in chart 04/24/2021   Ascending aortic aneurysm ([place]) 04/24/2021   Gait disturbance 04/19/2021   Acute-on-chronic kidney injury ([place]) 12/03/2020   Subtherapeutic international normalized ratio (INR) 12/03/2020   Chest pain 12/02/2020   Neuropathic pain 11/25/2020   Seizures ([place]) 06/16/2020   Over weight 05/03/2020   Nonischemic cardiomyopathy ([place]) 03/04/2020   Biventricular automatic implantable cardioverter defibrillator in situ 02/19/2020   Chronic diastolic heart failure ([place]) 02/19/2020   Hx of CABG 02/19/2020   Coronary artery disease involving native coronary artery of native heart without angina pectoris 02/19/2020   Essential hypertension 02/19/2020   Permanent atrial fibrillation ([place]) 02/19/2020   CKD (chronic kidney disease) stage 4, GFR 15-29 ml/min ([place]) 02/19/2020   Renal cell carcinoma of right kidney ([place]) 02/12/2020   Malignant neoplasm metastatic to brain ([place]) 02/12/2020   Gout attack 08/14/2016   Chronic a-fib ([place]) 08/14/2016   Chronic anticoagulation 08/14/2016   Acute on chronic systolic heart failure ([place]) 08/09/2016   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     Current Outpatient Medications on File Prior to Visit  Medication Sig Dispense Refill  acetaminophen (TYLENOL) 500 MG tablet Take 1,000 mg by mouth in the morning and at bedtime.     colchicine 0.6 MG tablet TAKE ONE TABLET BY MOUTH DAILY AT BEDTIME 90 tablet 0   COVID-19 mRNA vaccine 2023-2024 (COMIRNATY) syringe Inject into the muscle. 0.3 mL 0   levETIRAcetam (KEPPRA) 500 MG tablet Take 1 tablet (500 mg total) by mouth 2 (two) times  daily. 180 tablet 3   nitroGLYCERIN (NITROSTAT) 0.4 MG SL tablet Place 1 tablet (0.4 mg total) under the tongue every 5 (five) minutes as needed for chest pain. 25 tablet 3   OVER THE COUNTER MEDICATION Apply 1 application topically 2 (two) times daily. Pain cream in groin area     polyethylene glycol (MIRALAX / GLYCOLAX) 17 g packet Take 17 g by mouth daily as needed for mild constipation.     pravastatin (PRAVACHOL) 20 MG tablet Take 1 tablet by mouth once nightly 90 tablet 1   spironolactone (ALDACTONE) 25 MG tablet TAKE ONE TABLET BY MOUTH ONE TIME DAILY 90 tablet 1   torsemide (DEMADEX) 20 MG tablet TAKE 2 TABLETS BY MOUTH EVERY MORNING AND 1 TABLET EVERY EVENING 270 tablet 1   warfarin (COUMADIN) 5 MG tablet TAKE 1 TABLET BY MOUTH ONCE DAILY IN THE EVENING OR AS DIRECTED BY COUMADIN CLINIC 90 tablet 0   No current facility-administered medications on file prior to visit.    Allergies  Allergen Reactions   Avocado Shortness Of Breath    Before 2007   Banana Anaphylaxis and Swelling   Plantain Anaphylaxis and Swelling   Ace Inhibitors Cough   Codeine Other (See Comments)    Nausea and Feels Jittery   Prednisone Swelling    Of face and lower extremities   Tape Hives    Tape  Does better with paper tape    Latex Rash   Pharmabase Cosmetic [[place]] Swelling and Rash    Some Soap, perfumes,detergent    Social History   Socioeconomic History   Marital status: Widowed    Spouse name: Not on file   Number of children: 0   Years of education: Doctorate   Highest education level: Master's degree (e.g., MA, MS, MEng, MEd, MSW, MBA)  Occupational History   Occupation: Retired Clinical biochemist  Tobacco Use   Smoking status: Former    Types: Cigarettes    Quit date: 07/30/2014    Years since quitting: 7.6   Smokeless tobacco: Never  Vaping Use   Vaping Use: Never used  Substance and Sexual Activity   Alcohol use: Yes    Comment: 1 glass wine twice weekly   Drug use: No   Sexual  activity: Not on file  Other Topics Concern   Not on file  Social History Narrative   Right handed   Coffee daily/tea qod   Social Determinants of Health   Financial Resource Strain: Not on file  Food Insecurity: Not on file  Transportation Needs: Not on file  Physical Activity: Not on file  Stress: Not on file  Social Connections: Not on file  Intimate Partner Violence: Not on file    Family History  Problem Relation Age of Onset   Heart attack Mother    Heart attack Father    Heart attack Sister    Breast cancer Neg Hx     Past Surgical History:  Procedure Laterality Date   ABDOMINAL HYSTERECTOMY     CORONARY ARTERY BYPASS GRAFT     CRANIOTOMY Right 07/30/2019  Right frontal   NEPHRECTOMY Right 10/16/2019   TOTAL HIP ARTHROPLASTY Right 07/11/2021   Procedure: RIGHT TOTAL HIP ARTHROPLASTY ANTERIOR APPROACH;  Surgeon: Frederik Pear, MD;  Location: [facility];  Service: Orthopedics;  Laterality: Right;    ROS: Review of Systems Negative except as stated above  PHYSICAL EXAM: BP 120/78 (BP Location: Right Arm, Patient Position: Sitting, Cuff Size: Normal)   Pulse (!) 52   Temp 97.6 F (36.4 C) (Oral)   Ht _0  (1.727 m)   Wt 189 lb (85.7 kg)   SpO2 99%   BMI 28.74 kg/m   Wt Readings from Last 3 Encounters:  03/28/22 189 lb (85.7 kg)  03/17/22 191 lb (86.6 kg)  03/14/22 193 lb 1.6 oz (87.6 kg)    Physical Exam  General appearance - alert, well appearing, elderly African-American female and in no distress.  She has her cane with her. Mental status - normal mood, behavior, speech, dress, motor activity, and thought processes Chest - clear to auscultation, no wheezes, rales or rhonchi, symmetric air entry Heart - normal rate, regular rhythm, normal S1, S2, no murmurs, rubs, clicks or gallops Extremities - peripheral pulses normal, no pedal edema, no clubbing or cyanosis      Latest Ref Rng & Units 03/02/2022    9:09 AM 08/23/2021   11:35 AM 07/12/2021    3:28  AM  CMP  Glucose 70 - 99 mg/dL 79  80  127   BUN 8 - 23 mg/dL 54  44  34   Creatinine 0.44 - 1.00 mg/dL 3.07  2.58  2.28   Sodium 135 - 145 mmol/L 138  137  134   Potassium 3.5 - 5.1 mmol/L 3.8  4.4  5.1   Chloride 98 - 111 mmol/L 102  100  105   CO2 22 - 32 mmol/L _1 Calcium 8.9 - 10.3 mg/dL 9.7  10.1  8.8   Total Protein 6.5 - 8.1 g/dL 8.0  8.0    Total Bilirubin 0.3 - 1.2 mg/dL 0.5  0.7    Alkaline Phos 38 - 126 U/L 122  102    AST 15 - 41 U/L 21  17    ALT 0 - 44 U/L 16  11     Lipid Panel  No results found for: "CHOL", "TRIG", "HDL", "CHOLHDL", "VLDL", "LDLCALC", "LDLDIRECT"  CBC    Component Value Date/Time   WBC 6.9 03/02/2022 0909   WBC 9.9 07/14/2021 0329   RBC 5.10 03/02/2022 0909   HGB 12.3 03/02/2022 0909   HGB 12.4 08/18/2021 1219   HCT 37.6 03/02/2022 0909   HCT 39.5 08/18/2021 1219   PLT 254 03/02/2022 0909   PLT 292 08/18/2021 1219   MCV 73.7 (L) 03/02/2022 0909   MCV 82 08/18/2021 1219   MCH 24.1 (L) 03/02/2022 0909   MCHC 32.7 03/02/2022 0909   RDW 14.4 03/02/2022 0909   RDW 16.0 (H) 08/18/2021 1219   LYMPHSABS 3.2 03/02/2022 0909   MONOABS 0.9 03/02/2022 0909   EOSABS 0.2 03/02/2022 0909   BASOSABS 0.0 03/02/2022 0909    ASSESSMENT AND PLAN:  1. Essential hypertension At goal.  Continue spironolactone 25 mg daily, hydralazine 10 mg 3 times a day, torsemide 20 mg 2 tablets in the morning and 1 tablet in the evening, and METOPROLOL 25 mg daily  2. Coronary artery disease involving native coronary artery of native heart without angina pectoris Continue metoprolol and pravastatin - Lipid panel -  Hepatic function panel  3. Chronic a-fib (HCC) On Coumadin.  4. Postablative hypothyroidism - levothyroxine (SYNTHROID) 25 MCG tablet; TAKE ONE TABLET BY MOUTH ONE TIME DAILY IN THE MORNING 30-60 MINUTES BEFORE BREAKFAST ON EMPTY STOMACH WITH FULL GLASS OF WATER  Dispense: 90 tablet; Refill: 1 - TSH  5. CKD (chronic kidney disease) stage 4,  GFR 15-29 ml/min (HCC) Stable.  6. Need for RSV immunization Prescription given for RSV vaccine  7. Chronic insomnia We discussed having her try sleepy time tea. With her CBD is sometimes used for chronic pain, anxiety, chemotherapy-induced nausea etc  It can cause some sedation.  In looking at up-to-date, it says that it can interact with warfarin so I advised against it.  She declines trial of prescription medication like trazodone.    Patient was given the opportunity to ask questions.  Patient verbalized understanding of the plan and was able to repeat key elements of the plan.   This documentation was completed using Radio producer.  Any transcriptional errors are unintentional.  Orders Placed This Encounter  Procedures   Lipid panel   Hepatic function panel   TSH     Requested Prescriptions   Signed Prescriptions Disp Refills   RSV vaccine recomb adjuvanted (AREXVY) 120 MCG/0.5ML injection 0.5 mL 0    Sig: Inject 0.5 mLs into the muscle once for 1 dose.   hydrALAZINE (APRESOLINE) 10 MG tablet 270 tablet 2    Sig: Take 1 tablet (10 mg total) by mouth 3 (three) times daily.   levothyroxine (SYNTHROID) 25 MCG tablet 90 tablet 1    Sig: TAKE ONE TABLET BY MOUTH ONE TIME DAILY IN THE MORNING 30-60 MINUTES BEFORE BREAKFAST ON EMPTY STOMACH WITH FULL GLASS OF WATER   metoprolol succinate (TOPROL-XL) 25 MG 24 hr tablet 90 tablet 1    Sig: Take 1 tablet (25 mg total) by mouth daily.    Return in about 4 months (around 07/27/2022) for Give appt with CMA for Medicare Wellness Visit via phone.Karle Plumber, MD, FACP

## 2022-03-29 ENCOUNTER — Other Ambulatory Visit: Payer: Self-pay | Admitting: Internal Medicine

## 2022-03-29 ENCOUNTER — Ambulatory Visit (HOSPITAL_COMMUNITY)
Admission: RE | Admit: 2022-03-29 | Discharge: 2022-03-29 | Disposition: A | Discharge status: Home / Self Care | Payer: Medicare Other | Source: Ambulatory Visit | Attending: Internal Medicine | Admitting: Internal Medicine

## 2022-03-29 DIAGNOSIS — C7931 Secondary malignant neoplasm of brain: Secondary | ICD-10-CM | POA: Insufficient documentation

## 2022-03-29 LAB — HEPATIC FUNCTION PANEL
ALT: 18 IU/L (ref 0–32)
AST: 24 IU/L (ref 0–40)
Albumin: 4.8 g/dL (ref 3.8–4.8)
Alkaline Phosphatase: 122 IU/L — ABNORMAL HIGH (ref 44–121)
Bilirubin Total: 0.6 mg/dL (ref 0.0–1.2)
Bilirubin, Direct: 0.16 mg/dL (ref 0.00–0.40)
Total Protein: 7.7 g/dL (ref 6.0–8.5)

## 2022-03-29 LAB — TSH: TSH: 3.82 u[IU]/mL (ref 0.450–4.500)

## 2022-03-29 LAB — LIPID PANEL
Chol/HDL Ratio: 2.8 ratio (ref 0.0–4.4)
Cholesterol, Total: 181 mg/dL (ref 100–199)
HDL: 64 mg/dL (ref >39)
LDL Chol Calc (NIH): 96 mg/dL (ref 0–99)
Triglycerides: 118 mg/dL (ref 0–149)
VLDL Cholesterol Cal: 21 mg/dL (ref 5–40)

## 2022-03-29 MED ORDER — GADOBUTROL 1 MMOL/ML IV SOLN: 8.5000 mL | Freq: Once | INTRAVENOUS | Status: DC | PRN

## 2022-03-29 MED ORDER — GADOBUTROL 1 MMOL/ML IV SOLN
8.5000 mL | Freq: Once | INTRAVENOUS | Status: AC | PRN
  Administered 2022-03-29: 8.5 mL via INTRAVENOUS

## 2022-03-29 MED ORDER — PRAVASTATIN SODIUM 40 MG PO TABS: 40.0000 mg | ORAL_TABLET | Freq: Every day | ORAL | 2 refills | Status: DC

## 2022-03-29 NOTE — Progress Notes (Signed)
Patient here today at [facility] for MRI brain w wo contrast. Patient has Product/process development scientist. [facility] performed with Joey-Rep. Orders received for VOO 80. Will re-program once scan is completed.

## 2022-03-30 ENCOUNTER — Ambulatory Visit: Payer: Medicare Other | Attending: Cardiovascular Disease

## 2022-03-30 DIAGNOSIS — Z7901 Long term (current) use of anticoagulants: Secondary | ICD-10-CM | POA: Diagnosis not present

## 2022-03-30 DIAGNOSIS — I482 Chronic atrial fibrillation, unspecified: Secondary | ICD-10-CM

## 2022-03-30 LAB — POCT INR: INR: 4.1 — AB (ref 2.0–3.0)

## 2022-03-30 NOTE — Patient Instructions (Signed)
Description   Hold today's dose and then continue taking Warfarin 1 tablet daily.  Recheck INR in 3 weeks.  Coumadin Clinic [phone]

## 2022-04-03 ENCOUNTER — Telehealth: Payer: Self-pay | Admitting: Internal Medicine

## 2022-04-03 ENCOUNTER — Inpatient Hospital Stay: Payer: Medicare Other | Attending: Oncology | Admitting: Internal Medicine

## 2022-04-03 ENCOUNTER — Inpatient Hospital Stay: Payer: Medicare Other

## 2022-04-03 VITALS — BP 135/77 | HR 69 | Temp 98.4°F | Resp 19 | Wt 192.2 lb

## 2022-04-03 DIAGNOSIS — C7931 Secondary malignant neoplasm of brain: Secondary | ICD-10-CM | POA: Insufficient documentation

## 2022-04-03 DIAGNOSIS — R569 Unspecified convulsions: Secondary | ICD-10-CM | POA: Diagnosis not present

## 2022-04-03 DIAGNOSIS — I110 Hypertensive heart disease with heart failure: Secondary | ICD-10-CM | POA: Diagnosis not present

## 2022-04-03 DIAGNOSIS — Z90710 Acquired absence of both cervix and uterus: Secondary | ICD-10-CM | POA: Diagnosis not present

## 2022-04-03 DIAGNOSIS — Z950 Presence of cardiac pacemaker: Secondary | ICD-10-CM | POA: Diagnosis not present

## 2022-04-03 DIAGNOSIS — C649 Malignant neoplasm of unspecified kidney, except renal pelvis: Secondary | ICD-10-CM | POA: Diagnosis not present

## 2022-04-03 DIAGNOSIS — Z87891 Personal history of nicotine dependence: Secondary | ICD-10-CM | POA: Insufficient documentation

## 2022-04-03 DIAGNOSIS — I509 Heart failure, unspecified: Secondary | ICD-10-CM | POA: Diagnosis not present

## 2022-04-03 NOTE — Progress Notes (Signed)
[facility] [place], [place] [address] [phone]   Interval Evaluation  Date of Service: 04/03/22 Patient Name: Jacqueline Palmer Patient MRN: [id number] Patient DOB: 1945-07-04 Provider: Ventura Sellers, MD  Identifying Statement:  Jacqueline Palmer is a 76 y.o. female with Malignant neoplasm metastatic to brain [facility]) [C79.31]   Primary Cancer: Renal Cell Carcinoma, Stage IV  Oncologic History: 07/30/19: Right frontal craniotomy, resection of solitary brain metastasis at [facility] 10/06/19: Post-operative SRS at [place] (Dr. Tyrone Nine) 03/09/20: Salvage SRS to R frontal+LMD 27/3x 02/08/21: Salvage SRS to L frontal Jacqueline Palmer)  Interval History:  Jacqueline Palmer presents today for follow up after recent MRI brain.  Denies new or progressive changes. No recent seizures, continues to take Keppra '500mg'$  twice per day.  Continues to be active mentally and engaging socially.  H+P (02/24/20) Patient presents to review recent changes on brain MRI.  She initially presented in March 2021 with a seizure, described as "left side clenching up, lost awareness for a few minutes".  CNS imaging demonstrated an enhancing right frontal mass, c/w likely metastasis from renal cell carcinoma.  Craniotomy was performed at [facility] and followed with surgical bed radiosurgery at [facility].  She did have one recurrence of seizure activity in late August, at which time she was restarted on Keppra.  Unclear on why this was discontinued.  Recent MRI demonstrated some change and she presents today for treatment recommendations.  No neurologic complaints aside from seizures.  Medications: Current Outpatient Medications on File Prior to Visit  Medication Sig Dispense Refill   acetaminophen (TYLENOL) 500 MG tablet Take 1,000 mg by mouth in the morning and at bedtime.     colchicine 0.6 MG tablet TAKE ONE TABLET BY MOUTH DAILY AT BEDTIME 90 tablet 0   COVID-19 mRNA vaccine 2023-2024  (COMIRNATY) syringe Inject into the muscle. 0.3 mL 0   hydrALAZINE (APRESOLINE) 10 MG tablet Take 1 tablet (10 mg total) by mouth 3 (three) times daily. 270 tablet 2   levETIRAcetam (KEPPRA) 500 MG tablet Take 1 tablet (500 mg total) by mouth 2 (two) times daily. 180 tablet 3   levothyroxine (SYNTHROID) 25 MCG tablet TAKE ONE TABLET BY MOUTH ONE TIME DAILY IN THE MORNING 30-60 MINUTES BEFORE BREAKFAST ON EMPTY STOMACH WITH FULL GLASS OF WATER 90 tablet 1   metoprolol succinate (TOPROL-XL) 25 MG 24 hr tablet Take 1 tablet (25 mg total) by mouth daily. 90 tablet 1   nitroGLYCERIN (NITROSTAT) 0.4 MG SL tablet Place 1 tablet (0.4 mg total) under the tongue every 5 (five) minutes as needed for chest pain. 25 tablet 3   OVER THE COUNTER MEDICATION Apply 1 application topically 2 (two) times daily. Pain cream in groin area     polyethylene glycol (MIRALAX / GLYCOLAX) 17 g packet Take 17 g by mouth daily as needed for mild constipation.     pravastatin (PRAVACHOL) 40 MG tablet Take 1 tablet (40 mg total) by mouth daily. 90 tablet 2   spironolactone (ALDACTONE) 25 MG tablet TAKE ONE TABLET BY MOUTH ONE TIME DAILY 90 tablet 1   torsemide (DEMADEX) 20 MG tablet TAKE 2 TABLETS BY MOUTH EVERY MORNING AND 1 TABLET EVERY EVENING 270 tablet 1   warfarin (COUMADIN) 5 MG tablet TAKE 1 TABLET BY MOUTH ONCE DAILY IN THE EVENING OR AS DIRECTED BY COUMADIN CLINIC 90 tablet 0   No current facility-administered medications on file prior to visit.    Allergies:  Allergies  Allergen Reactions   Avocado Shortness Of Breath    Before 2007   Banana Anaphylaxis and Swelling   Plantain Anaphylaxis and Swelling   Ace Inhibitors Cough   Codeine Other (See Comments)    Nausea and Feels Jittery   Prednisone Swelling    Of face and lower extremities   Tape Hives    Tape  Does better with paper tape    Latex Rash   Pharmabase Cosmetic [[place]] Swelling and Rash    Some Soap, perfumes,detergent   Past Medical History:   Past Medical History:  Diagnosis Date   Acute kidney injury ([place])    Arthritis    Atrial fibrillation ([place])    Cancer ([place])    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   Cardiomyopathy ([place])    CHF (congestive heart failure) (H[facility])    Chronic a-fib ([place]) 08/14/2016   Chronic anticoagulation 08/14/2016   Coronary artery disease    4v CABG 6/17   Graves disease    Hypertension    Hypothyroidism    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/2015   SN [id number] [From patient's surgical info card]   Presence of permanent cardiac pacemaker    [place]    Thrombus 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 myocardial infarction    Thyroid disease    Past Surgical History:  Past Surgical History:  Procedure Laterality Date   ABDOMINAL HYSTERECTOMY     CORONARY ARTERY BYPASS GRAFT     CRANIOTOMY Right 07/30/2019   Right frontal   NEPHRECTOMY Right 10/16/2019   TOTAL HIP ARTHROPLASTY Right 07/11/2021   Procedure: RIGHT TOTAL HIP ARTHROPLASTY ANTERIOR APPROACH;  Surgeon: RFrederik Pear MD;  Location: [facility];  Service: Orthopedics;  Laterality: Right;   Social History:  Social History   Socioeconomic History   Marital status: Widowed    Spouse name: Not on file   Number of children: 0   Years of education: Doctorate   Highest education level: Master's degree (e.g., MA, MS, MEng, MEd, MSW, MBA)  Occupational History   Occupation: Retired CClinical biochemist Tobacco Use   Smoking status: Former    Types: Cigarettes    Quit date: 07/30/2014    Years since quitting: 7.6   Smokeless tobacco: Never  Vaping Use   Vaping Use: Never used  Substance and Sexual Activity   Alcohol use: Yes    Comment: 1 glass wine twice weekly   Drug use: No   Sexual activity: Not on file  Other Topics Concern   Not on file  Social History Narrative   Right handed   Coffee daily/tea qod   Social Determinants of Health   Financial Resource Strain: Not on file  Food Insecurity: Not  on file  Transportation Needs: Not on file  Physical Activity: Not on file  Stress: Not on file  Social Connections: Not on file  Intimate Partner Violence: Not on file   Family History:  Family History  Problem Relation Age of Onset   Heart attack Mother    Heart attack Father    Heart attack Sister    Breast cancer Neg Hx     Review of Systems: Constitutional: Doesn't report fevers, chills or abnormal weight loss Eyes: Doesn't report blurriness of vision Ears, nose, mouth, throat, and face: Doesn't report sore throat Respiratory: Doesn't report cough, dyspnea or wheezes Cardiovascular: Doesn't report palpitation, chest discomfort  Gastrointestinal:  Doesn't report nausea, constipation, diarrhea GU: Doesn't report  incontinence Skin: Doesn't report skin rashes Neurological: Per HPI Musculoskeletal: Doesn't report joint pain Behavioral/Psych: Doesn't report anxiety  Physical Exam:    03/28/2022   11:19 AM 03/17/2022   10:29 AM 03/14/2022    8:56 AM  Vitals with BMI  Height '5\' 8"'$  '5\' 8"'$  '5\' 8"'$   Weight 189 lbs 191 lbs 193 lbs 2 oz  BMI 28.74 28.41 32.44  Systolic 010 272 536  Diastolic 78 70 79  Pulse 52 70 70     KPS: 80. General: Alert, cooperative, pleasant, in no acute distress Head: Normal EENT: No conjunctival injection or scleral icterus.  Lungs: Resp effort normal Cardiac: Regular rate Abdomen: Non-distended abdomen Skin: No rashes cyanosis or petechiae. Extremities: No clubbing or edema  Neurologic Exam: Mental Status: Awake, alert, attentive to examiner. Oriented to self and environment. Language is fluent with intact comprehension.  Cranial Nerves: Visual acuity is grossly normal. Visual fields are full. Extra-ocular movements intact. No ptosis. Face is symmetric Motor: Tone and bulk are normal. Power is full in both arms and legs. Reflexes are symmetric, no pathologic reflexes present.  Sensory: Intact to light touch Gait: Orthopedic  limitations  Labs: I have reviewed the data as listed    Component Value Date/Time   NA 138 03/02/2022 0909   NA 141 12/14/2020 1053   K 3.8 03/02/2022 0909   CL 102 03/02/2022 0909   CO2 28 03/02/2022 0909   GLUCOSE 79 03/02/2022 0909   BUN 54 (H) 03/02/2022 0909   BUN 45 (H) 12/14/2020 1053   CREATININE 3.07 (HH) 03/02/2022 0909   CALCIUM 9.7 03/02/2022 0909   PROT 7.7 03/28/2022 1157   ALBUMIN 4.8 03/28/2022 1157   AST 24 03/28/2022 1157   AST 21 03/02/2022 0909   ALT 18 03/28/2022 1157   ALT 16 03/02/2022 0909   ALKPHOS 122 (H) 03/28/2022 1157   BILITOT 0.6 03/28/2022 1157   BILITOT 0.5 03/02/2022 0909   GFRNONAA 15 (L) 03/02/2022 0909   GFRAA 30 (L) 01/20/2020 1433   Lab Results  Component Value Date   WBC 6.9 03/02/2022   NEUTROABS 2.6 03/02/2022   HGB 12.3 03/02/2022   HCT 37.6 03/02/2022   MCV 73.7 (L) 03/02/2022   PLT 254 03/02/2022    Imaging:  [facility] Clinician Interpretation: I have personally reviewed the CNS images as listed.  My interpretation, in the context of the patient's clinical presentation, is likely treatment effect  MR BRAIN W WO CONTRAST  Result Date: 04/01/2022 CLINICAL DATA:  Provided history: Brain/CNS neoplasm, assess treatment response. Malignant neoplasm metastatic to brain. Additional history obtained from prior radiology records: History of renal cell carcinoma with brain metastases, prior resection of a right frontal brain metastasis with postoperative SRS, and subsequent salvage SRS to this site. Prior SRS to a left frontal lobe metastasis. EXAM: MRI HEAD WITHOUT AND WITH CONTRAST TECHNIQUE: Multiplanar, multiecho pulse sequences of the brain and surrounding structures were obtained without and with intravenous contrast. CONTRAST:  8.62m GADAVIST GADOBUTROL 1 MMOL/ML IV SOLN COMPARISON:  Prior brain MRI examinations 11/25/2021 and earlier. FINDINGS: Brain: No age advanced or lobar predominant parenchymal atrophy. There is a 4 mm focus of  ill-defined enhancement within the posterolateral right frontal lobe (for instance as seen on series 16, image 118) (series 17, image 17) (series 18, image 5). This finding was not definitively present on prior examinations, and may reflect a new intracranial metastasis or a focus of prominent vascular enhancement. Trace edema is also questioned at this site (  series 9, image 52). Unchanged from the prior brain MRI of 11/25/2021, there are chronic blood products (including a thin peripheral rim of pre-contrast T1 hyperintense blood products) at site of a treated anterior left frontal lobe metastasis (series 14, image 102) (series 12, image 29). No appreciable enhancement at this site. No surrounding edema. Chronic microhemorrhages scattered elsewhere within the supratentorial brain, unchanged. Stable appearance of the resection cavity in the high posterior right frontal lobe, with associated blood products and non-masslike enhancement at this site. Surrounding T2 FLAIR hyperintense signal abnormality, also unchanged. Stable 1.8 cm non-enhancing focus of T2 FLAIR hyperintense signal abnormality within the cortex and juxtacortical white matter at the right temporoparietal junction (best appreciated on series 9, image 44). Mild-to-moderate multifocal T2 FLAIR hyperintense signal abnormality elsewhere within the cerebral white matter, nonspecific but likely reflecting a combination of chronic small vessel ischemic disease and post-treatment changes. Redemonstrated chronic lacunar infarcts within the bilateral deep gray nuclei, and small chronic infarcts within the bilateral cerebellar hemispheres. There is no acute infarct. No extra-axial fluid collection. No midline shift. Vascular: Maintained flow voids within the proximal large arterial vessels. Skull and upper cervical spine: Unchanged 10 mm peripherally enhancing right frontal skull lesion with involvement of the inner greater than outer tables, and slight  involvement of the underlying dura. Sinuses/Orbits: Unchanged mild bilateral proptosis. Chronic depression of the right lamina papyracea. No significant paranasal sinus disease. IMPRESSION: 1. 4 mm focus of ill-defined enhancement within the posterolateral right frontal lobe, as described and not definitely present on prior examinations. This may reflect a new metastasis or a focus of prominent vascular enhancement. Recommend short-interval MRI follow-up (without and with contrast) report for surveillance. 2. Stable appearance of a non-enhancing treated left frontal lobe metastasis. 3. Stable appearance of the right frontal resection cavity without evidence of tumor recurrence at this site. 4. Unchanged right frontal skull metastasis. 5. Unchanged non-enhancing 1.8 cm focus of T2 FLAIR hyperintense signal abnormality within the cortex and juxtacortical white matter at the right temporoparietal junction. This finding remains indeterminate, but may reflect a low-grade glioma or focus of cortical dysplasia. Electronically Signed   By: Kellie Simmering D.O.   On: 04/01/2022 15:12     Assessment/Plan Malignant neoplasm metastatic to brain [facility]) [C79.31]  Jacqueline Palmer is clinically and radiographically stable today.  MRI brain demonstrates focus of enhancement within right frontal lobe which is within [facility] treatment field from October 2021.  Fusion was performed by radiation physics. This may represent post-RT treatment effect, vs tumor regrowth.  Will recommend continued close observation.  Continues on observation with Dr. Alen Blew.    Recommended continuing Keppra at '500mg'$  BID.    We appreciate the opportunity to participate in the care of Jacqueline Palmer.    We ask that Jacqueline Palmer return to clinic in 2 months following next brain MRI, or sooner as needed.  All questions were answered. The patient knows to call the clinic with any problems, questions or concerns. No barriers to learning were  detected.  The total time spent in the encounter was 30 minutes and more than 50% was on counseling and review of test results   Ventura Sellers, MD Medical Director of Neuro-Oncology [facility] [place] 04/03/22 10:54 AM

## 2022-04-03 NOTE — Telephone Encounter (Signed)
Per 11/13 los called and spoke to pt about appointments

## 2022-04-04 ENCOUNTER — Other Ambulatory Visit: Payer: Self-pay | Admitting: Radiation Therapy

## 2022-04-09 ENCOUNTER — Other Ambulatory Visit: Payer: Self-pay | Admitting: Internal Medicine

## 2022-04-10 ENCOUNTER — Other Ambulatory Visit: Payer: Self-pay | Admitting: Cardiology

## 2022-04-20 ENCOUNTER — Ambulatory Visit: Payer: Medicare Other | Attending: Cardiology

## 2022-04-20 DIAGNOSIS — I482 Chronic atrial fibrillation, unspecified: Secondary | ICD-10-CM | POA: Diagnosis not present

## 2022-04-20 DIAGNOSIS — Z7901 Long term (current) use of anticoagulants: Secondary | ICD-10-CM | POA: Diagnosis not present

## 2022-04-20 LAB — POCT INR: INR: 2.9 (ref 2.0–3.0)

## 2022-04-20 NOTE — Patient Instructions (Signed)
Description   Continue taking Warfarin 1 tablet daily.  Recheck INR in 4 weeks.  Coumadin Clinic [phone]

## 2022-04-21 ENCOUNTER — Ambulatory Visit: Payer: Medicare Other | Attending: Internal Medicine

## 2022-04-21 DIAGNOSIS — Z0000 Encounter for general adult medical examination without abnormal findings: Secondary | ICD-10-CM

## 2022-04-21 MED ORDER — ZOSTER VAC RECOMB ADJUVANTED 50 MCG/0.5ML IM SUSR: 0.5000 mL | Freq: Once | INTRAMUSCULAR | 1 refills | Status: AC

## 2022-04-21 NOTE — Progress Notes (Signed)
Subjective:   Jacqueline Palmer is a 76 y.o. female who presents for Medicare Annual (Subsequent) preventive examination.  Review of Systems     I connected with Jacqueline Palmer on 04/21/2022 at 11:03 am by telephone and verified that I am speaking with the correct person using two identifiers. I discussed the limitations, risks, security and privacy concerns of performing an evaluation and management service by telephone and the availability of in person appointments. I also discussed with the patient that there may be a patient responsible charge related to this service. The patient expressed understanding and agreed to proceed.   Patient location: Home My Location: [place] on the telephone call: Myself and Patinet     Cardiac Risk Factors include: none     Objective:    There were no vitals filed for this visit. There is no height or weight on file to calculate BMI.     04/21/2022   11:08 AM 08/30/2021    8:42 AM 07/11/2021    3:51 PM 07/01/2021    1:07 PM 02/02/2021    9:39 AM 02/01/2021   12:01 PM 01/29/2021    3:31 PM  Advanced Directives  Does Patient Have a Medical Advance Directive? Yes  Yes Yes Yes  Yes  Type of Paramedic of [place];Living will [place];Living will Healthcare Power of Attorney Living will Living will [place];Living will   Does patient want to make changes to medical advance directive?   No - Patient declined  No - Patient declined    Copy of [place] in Chart?  No - copy requested    No - copy requested     Current Medications (verified) Outpatient Encounter Medications as of 04/21/2022  Medication Sig   acetaminophen (TYLENOL) 500 MG tablet Take 1,000 mg by mouth in the morning and at bedtime.   colchicine 0.6 MG tablet TAKE ONE TABLET BY MOUTH DAILY AT BEDTIME   hydrALAZINE (APRESOLINE) 10 MG tablet Take 1 tablet (10 mg total) by mouth 3 (three)  times daily.   levETIRAcetam (KEPPRA) 500 MG tablet Take 1 tablet (500 mg total) by mouth 2 (two) times daily.   levothyroxine (SYNTHROID) 25 MCG tablet TAKE ONE TABLET BY MOUTH ONE TIME DAILY IN THE MORNING 30-60 MINUTES BEFORE BREAKFAST ON EMPTY STOMACH WITH FULL GLASS OF WATER   metoprolol succinate (TOPROL-XL) 25 MG 24 hr tablet Take 1 tablet (25 mg total) by mouth daily.   nitroGLYCERIN (NITROSTAT) 0.4 MG SL tablet Place 1 tablet (0.4 mg total) under the tongue every 5 (five) minutes as needed for chest pain.   OVER THE COUNTER MEDICATION Apply 1 application topically 2 (two) times daily. Pain cream in groin area   polyethylene glycol (MIRALAX / GLYCOLAX) 17 g packet Take 17 g by mouth daily as needed for mild constipation.   pravastatin (PRAVACHOL) 40 MG tablet Take 1 tablet (40 mg total) by mouth daily.   spironolactone (ALDACTONE) 25 MG tablet TAKE ONE TABLET BY MOUTH ONE TIME DAILY   torsemide (DEMADEX) 20 MG tablet TAKE 2 TABLETS BY MOUTH EVERY MORNING AND 1 TABLET EVERY EVENING   warfarin (COUMADIN) 5 MG tablet TAKE 1 TABLET BY MOUTH ONCE DAILY IN THE EVENING OR AS DIRECTED BY COUMADIN CLINIC   Zoster Vaccine Adjuvanted [facility]) injection Inject 0.5 mLs into the muscle once for 1 dose.   COVID-19 mRNA vaccine 2023-2024 (COMIRNATY) syringe Inject into the muscle. (Patient  not taking: Reported on 04/21/2022)   No facility-administered encounter medications on file as of 04/21/2022.    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   SN [id number] [From patient's surgical info card]   Presence of permanent cardiac pacemaker    Seizures ([place]    Thrombus 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 myocardial infarction    Thyroid disease    Past Surgical History:  Procedure Laterality Date   ABDOMINAL HYSTERECTOMY     CORONARY ARTERY BYPASS GRAFT     CRANIOTOMY Right 07/30/2019   Right frontal   NEPHRECTOMY Right 10/16/2019   TOTAL HIP ARTHROPLASTY Right 07/11/2021   Procedure: RIGHT TOTAL HIP ARTHROPLASTY ANTERIOR APPROACH;  Surgeon: RFrederik Pear MD;  Location: [facility];  Service: Orthopedics;  Laterality: Right;   Family History  Problem Relation Age of Onset   Heart attack Mother    Heart attack Father    Heart attack Sister    Breast cancer Neg Hx    Social History   Socioeconomic History   Marital status: Widowed    Spouse name: Not on file   Number of children: 0   Years of education: Doctorate   Highest education level: Master's degree (e.g., MA, MS, MEng, MEd, MSW, MBA)  Occupational History   Occupation: Retired CClinical biochemist Tobacco Use   Smoking status: Former    Types: Cigarettes    Quit date: 07/30/2014    Years since quitting: 7.7   Smokeless tobacco: Never  Vaping Use   Vaping Use: Never used  Substance and Sexual Activity   Alcohol use: Yes    Comment: 1 glass wine twice weekly   Drug use: No   Sexual activity: Not on file  Other Topics Concern   Not on file  Social History Narrative   Right handed   Coffee daily/tea qod   Social Determinants of Health   Financial Resource Strain: Low Risk  (04/21/2022)   Overall Financial Resource Strain (CARDIA)    Difficulty of Paying Living Expenses: Not hard at all  Food Insecurity: Unknown (04/21/2022)   Hunger Vital Sign    Worried About Running Out of Food in the Last Year: Never true    RY[place] the Last Year: Not  on file  Transportation Needs: No Transportation Needs (04/21/2022)   PRAPARE - THydrologist(Medical): No    Lack of Transportation (Non-Medical): No  Physical Activity: Insufficiently Active (04/21/2022)   Exercise Vital Sign    Days of Exercise per Week: 3 days    Minutes of Exercise per Session: 30 min  Stress: No Stress Concern Present (04/21/2022)   [place]   Feeling of Stress : Not at all  Social Connections: Moderately Integrated (04/21/2022)   Social Connection and Isolation Panel [NHANES]  Frequency of Communication with Friends and Family: More than three times a week    Frequency of Social Gatherings with Friends and Family: More than three times a week    Attends Religious Services: 1 to 4 times per year    Active Member of [employer] or Organizations: Yes    Attends Archivist Meetings: More than 4 times per year    Marital Status: Widowed    Tobacco Counseling Counseling given: No   Clinical Intake:  Pre-visit preparation completed: Yes  Pain : No/denies pain     Nutritional Risks: None Diabetes: No  How often do you need to have someone help you when you read instructions, pamphlets, or other written materials from your doctor or pharmacy?: 1 - Never  Diabetic?NO  Interpreter Needed?: No      Activities of Daily Living    04/21/2022   11:24 AM 04/17/2022    9:03 PM  In your present state of health, do you have any difficulty performing the following activities:  Hearing? 0 0  Vision? 0 0  Difficulty concentrating or making decisions? 1 0  Walking or climbing stairs? 0 1  Dressing or bathing? 0 0  Doing errands, shopping? 0 0  Preparing Food and eating ? N N  Using the Toilet? N N  In the past six months, have you accidently leaked urine? N N  Do you have problems with loss of bowel control? N N  Managing your Medications? N N  Managing your  Finances? N N  Housekeeping or managing your Housekeeping? N N    Patient Care Team: Ladell Pier, MD as PCP - General (Internal Medicine) Buford Dresser, MD as PCP - Cardiology (Cardiology) Vickie Epley, MD as PCP - Electrophysiology (Cardiology)  Indicate any recent Medical Services you may have received from other than [facility] providers in the past year (date may be approximate).     Assessment:   This is a routine wellness examination for [place].  Hearing/Vision screen No results found.  Dietary issues and exercise activities discussed: Current Exercise Habits: Home exercise routine, Type of exercise: walking, Time (Minutes): 30, Frequency (Times/Week): 3, Weekly Exercise (Minutes/Week): 90, Intensity: Mild, Exercise limited by: None identified   Goals Addressed   None    Depression Screen    04/21/2022   11:09 AM 03/28/2022   11:18 AM 08/18/2021   11:18 AM 04/19/2021   11:52 AM 01/29/2021    3:28 PM 12/14/2020    9:35 AM 05/03/2020    3:14 PM  PHQ 2/9 Scores  PHQ - 2 Score 0 0 0 0 0 0 0  PHQ- 9 Score  3         Fall Risk    04/21/2022   11:08 AM 04/17/2022    9:03 PM 03/28/2022   11:18 AM 08/18/2021   11:18 AM 04/19/2021   11:52 AM  Fall Risk   Falls in the past year? 0 0 0 0 0  Number falls in past yr: 0  0 0 0  Injury with Fall? 0  0 0 0  Risk for fall due to :   No Fall Risks No Fall Risks No Fall Risks    FALL RISK PREVENTION PERTAINING TO THE HOME:  Any stairs in or around the home? Yes  If so, are there any without handrails? No  Home free of loose throw rugs in walkways, pet beds, electrical cords, etc? Yes  Adequate lighting in your home to reduce  risk of falls? Yes   ASSISTIVE DEVICES UTILIZED TO PREVENT FALLS:  Life alert? No  Use of a cane, walker or w/c? Yes  Grab bars in the bathroom? Yes  Shower chair or bench in shower? Yes  Elevated toilet seat or a handicapped toilet? Yes   TIMED UP AND GO:  Was the test performed?  No .  Length of time to ambulate 10 feet: N/A sec.   Gait slow and steady with assistive device  Cognitive Function:    04/21/2022   11:09 AM  MMSE - Mini Mental State Exam  Orientation to time 5  Orientation to Place 5  Registration 3  Attention/ Calculation 5  Recall 3  Language- name 2 objects 2  Language- repeat 1  Language- follow 3 step command 3  Language- read & follow direction 1  Write a sentence 1  Copy design 1  Total score 30        04/21/2022   11:09 AM 01/29/2021    3:28 PM  6CIT Screen  What Year? 0 points 0 points  What month? 0 points 0 points  What time? 0 points 0 points  Count back from 20 0 points 0 points  Months in reverse 0 points 0 points  Repeat phrase 0 points   Total Score 0 points     Immunizations Immunization History  Administered Date(s) Administered   COVID-19, mRNA, vaccine(Comirnaty)12 years and older 03/17/2022   Influenza, High Dose Seasonal PF 02/16/2016, 04/20/2017   Influenza,inj,Quad PF,6+ Mos 02/18/2022   Influenza-Unspecified 03/04/2021   PFIZER Comirnaty(Gray Top)Covid-19 Tri-Sucrose Vaccine 07/03/2019, 07/24/2019   PFIZER(Purple Top)SARS-COV-2 Vaccination 03/29/2020   Pneumococcal Conjugate (Pcv15) 04/19/2021   Pneumococcal Polysaccharide-23 08/11/2016   Tdap 12/14/2020    TDAP status: Up to date  Flu Vaccine status: Up to date  Pneumococcal vaccine status: Up to date  Covid-19 vaccine status: Completed vaccines  Qualifies for Shingles Vaccine? Yes   Zostavax completed No   Shingrix Completed?: No.    Education has been provided regarding the importance of this vaccine. Patient has been advised to call insurance company to determine out of pocket expense if they have not yet received this vaccine. Advised may also receive vaccine at local pharmacy or Health Dept. Verbalized acceptance and understanding.  Screening Tests Health Maintenance  Topic Date Due   Hepatitis C Screening  Never done   Zoster  Vaccines- Shingrix (1 of 2) Never done   COVID-19 Vaccine (4 - 2023-24 season) 01/20/2022   Medicare Annual Wellness (AWV)  01/29/2022   COLONOSCOPY (Pts 45-53yr Insurance coverage will need to be confirmed)  08/04/2022   DTaP/Tdap/Td (2 - Td or Tdap) 12/15/2030   Pneumonia Vaccine 76 Years old  Completed   INFLUENZA VACCINE  Completed   DEXA SCAN  Completed   HPV VACCINES  Aged Out    Health Maintenance  Health Maintenance Due  Topic Date Due   Hepatitis C Screening  Never done   Zoster Vaccines- Shingrix (1 of 2) Never done   COVID-19 Vaccine (4 - 2023-24 season) 01/20/2022   Medicare Annual Wellness (AWV)  01/29/2022    Colorectal cancer screening: Type of screening: Colonoscopy. Completed 08/03/2017. Repeat every 5 years  Mammogram status: No longer required due to Age.  Bone Density status: Completed 07/04/2018. Results reflect: Bone density results: OSTEOPOROSIS. Repeat every 2 years.  Lung Cancer Screening: (Low Dose CT Chest recommended if Age 76-80years, 30 pack-year currently smoking OR have quit w/in 15years.) does not qualify.  Lung Cancer Screening Referral: No  Additional Screening:  Hepatitis C Screening: does qualify; Completed NO  Vision Screening: Recommended annual ophthalmology exams for early detection of glaucoma and other disorders of the eye. Is the patient up to date with their annual eye exam?  Yes  Who is the provider or what is the name of the office in which the patient attends annual eye exams? N/A If pt is not established with a provider, would they like to be referred to a provider to establish care? No .   Dental Screening: Recommended annual dental exams for proper oral hygiene  Community Resource Referral / Chronic Care Management: CRR required this visit?  No   CCM required this visit?  No      Plan:     I have personally reviewed and noted the following in the patient's chart:   Medical and social history Use of alcohol,  tobacco or illicit drugs  Current medications and supplements including opioid prescriptions. Patient is not currently taking opioid prescriptions. Functional ability and status Nutritional status Physical activity Advanced directives List of other physicians Hospitalizations, surgeries, and ER visits in previous 12 months Vitals Screenings to include cognitive, depression, and falls Referrals and appointments  In addition, I have reviewed and discussed with patient certain preventive protocols, quality metrics, and best practice recommendations. A written personalized care plan for preventive services as well as general preventive health recommendations were provided to patient.     Gomez Cleverly, [place]   04/21/2022   Nurse Notes: I spent 30 minutes on this telephone encounter AVS mailed to patinet

## 2022-04-21 NOTE — Patient Instructions (Addendum)
Jacqueline Palmer , Thank you for taking time to come for your Medicare Wellness Visit. I appreciate your ongoing commitment to your health goals. Please review the following plan we discussed and let me know if I can assist you in the future.   These are the goals we discussed:  Goals   None     This is a list of the screening recommended for you and due dates:  Health Maintenance  Topic Date Due   Hepatitis C Screening: USPSTF Recommendation to screen - Ages 74-79 yo.  Never done   Zoster (Shingles) Vaccine (1 of 2) Never done   COVID-19 Vaccine (4 - 2023-24 season) 01/20/2022   Medicare Annual Wellness Visit  01/29/2022   Colon Cancer Screening  08/04/2022   DTaP/Tdap/Td vaccine (2 - Td or Tdap) 12/15/2030   Pneumonia Vaccine  Completed   Flu Shot  Completed   DEXA scan (bone density measurement)  Completed   HPV Vaccine  Aged Out  Health Maintenance After Age 57 After age 14, you are at a higher risk for certain long-term diseases and infections as well as injuries from falls. Falls are a major cause of broken bones and head injuries in people who are older than age 49. Getting regular preventive care can help to keep you healthy and well. Preventive care includes getting regular testing and making lifestyle changes as recommended by your health care provider. Talk with your health care provider about: Which screenings and tests you should have. A screening is a test that checks for a disease when you have no symptoms. A diet and exercise plan that is right for you. What should I know about screenings and tests to prevent falls? Screening and testing are the best ways to find a health problem early. Early diagnosis and treatment give you the best chance of managing medical conditions that are common after age 72. Certain conditions and lifestyle choices may make you more likely to have a fall. Your health care provider may recommend: Regular vision checks. Poor vision and conditions such as  cataracts can make you more likely to have a fall. If you wear glasses, make sure to get your prescription updated if your vision changes. Medicine review. Work with your health care provider to regularly review all of the medicines you are taking, including over-the-counter medicines. Ask your health care provider about any side effects that may make you more likely to have a fall. Tell your health care provider if any medicines that you take make you feel dizzy or sleepy. Strength and balance checks. Your health care provider may recommend certain tests to check your strength and balance while standing, walking, or changing positions. Foot health exam. Foot pain and numbness, as well as not wearing proper footwear, can make you more likely to have a fall. Screenings, including: Osteoporosis screening. Osteoporosis is a condition that causes the bones to get weaker and break more easily. Blood pressure screening. Blood pressure changes and medicines to control blood pressure can make you feel dizzy. Depression screening. You may be more likely to have a fall if you have a fear of falling, feel depressed, or feel unable to do activities that you used to do. Alcohol use screening. Using too much alcohol can affect your balance and may make you more likely to have a fall. Follow these instructions at home: Lifestyle Do not drink alcohol if: Your health care provider tells you not to drink. If you drink alcohol: Limit how much you  have to: 0-1 drink a day for women. 0-2 drinks a day for men. Know how much alcohol is in your drink. In the [place] one drink equals one 12 oz bottle of beer (355 mL), one 5 oz glass of wine (148 mL), or one 1 oz glass of hard liquor (44 mL). Do not use any products that contain nicotine or tobacco. These products include cigarettes, chewing tobacco, and vaping devices, such as e-cigarettes. If you need help quitting, ask your health care provider. Activity  Follow a  regular exercise program to stay fit. This will help you maintain your balance. Ask your health care provider what types of exercise are appropriate for you. If you need a cane or walker, use it as recommended by your health care provider. Wear supportive shoes that have nonskid soles. Safety  Remove any tripping hazards, such as rugs, cords, and clutter. Install safety equipment such as grab bars in bathrooms and safety rails on stairs. Keep rooms and walkways well-lit. General instructions Talk with your health care provider about your risks for falling. Tell your health care provider if: You fall. Be sure to tell your health care provider about all falls, even ones that seem minor. You feel dizzy, tiredness (fatigue), or off-balance. Take over-the-counter and prescription medicines only as told by your health care provider. These include supplements. Eat a healthy diet and maintain a healthy weight. A healthy diet includes low-fat dairy products, low-fat (lean) meats, and fiber from whole grains, beans, and lots of fruits and vegetables. Stay current with your vaccines. Schedule regular health, dental, and eye exams. Summary Having a healthy lifestyle and getting preventive care can help to protect your health and wellness after age 11. Screening and testing are the best way to find a health problem early and help you avoid having a fall. Early diagnosis and treatment give you the best chance for managing medical conditions that are more common for people who are older than age 16. Falls are a major cause of broken bones and head injuries in people who are older than age 38. Take precautions to prevent a fall at home. Work with your health care provider to learn what changes you can make to improve your health and wellness and to prevent falls. This information is not intended to replace advice given to you by your health care provider. Make sure you discuss any questions you have with your  health care provider. Document Revised: 09/27/2020 Document Reviewed: 09/27/2020 Elsevier Patient Education  [place].

## 2022-04-26 ENCOUNTER — Encounter: Payer: Self-pay | Admitting: Internal Medicine

## 2022-04-26 NOTE — Progress Notes (Signed)
Note received from nephrologist Dr. Candiss Norse.  Patient seen in 04/11/2022. Patient with CKD stage IV.  Per patient's wishes/previous discussions she does not want to do dialysis if and when indicated.  She will focus on CKD care/symptom control. Urinalysis negative for protein GFR 17 Hb 12.7 Ferritin 1247/iron 112/iron saturation 40%/iron binding capacity 283 PTH intact 193.

## 2022-04-27 NOTE — Addendum Note (Signed)
Addended by: Gomez Cleverly on: 04/27/2022 09:54 AM   Modules accepted: Level of Service

## 2022-05-02 ENCOUNTER — Other Ambulatory Visit: Payer: Self-pay | Admitting: Cardiology

## 2022-05-02 DIAGNOSIS — I482 Chronic atrial fibrillation, unspecified: Secondary | ICD-10-CM

## 2022-05-02 NOTE — Telephone Encounter (Signed)
Last INR 04/20/22 Last OV 03/17/2022

## 2022-05-02 NOTE — Telephone Encounter (Signed)
Please review for refill. Thank you! 

## 2022-05-10 ENCOUNTER — Encounter: Payer: Self-pay | Admitting: Internal Medicine

## 2022-05-10 ENCOUNTER — Other Ambulatory Visit: Payer: Self-pay | Admitting: Internal Medicine

## 2022-05-10 DIAGNOSIS — E890 Postprocedural hypothyroidism: Secondary | ICD-10-CM

## 2022-05-18 ENCOUNTER — Ambulatory Visit: Payer: Medicare Other | Attending: Cardiology | Admitting: *Deleted

## 2022-05-18 DIAGNOSIS — I482 Chronic atrial fibrillation, unspecified: Secondary | ICD-10-CM

## 2022-05-18 DIAGNOSIS — Z5181 Encounter for therapeutic drug level monitoring: Secondary | ICD-10-CM

## 2022-05-18 DIAGNOSIS — Z7901 Long term (current) use of anticoagulants: Secondary | ICD-10-CM

## 2022-05-18 LAB — POCT INR: INR: 2.6 (ref 2.0–3.0)

## 2022-05-18 NOTE — Patient Instructions (Signed)
Description   Continue taking Warfarin 1 tablet daily. Recheck INR in 5 weeks.  Coumadin Clinic [phone]

## 2022-05-25 ENCOUNTER — Ambulatory Visit (HOSPITAL_COMMUNITY)
Admission: RE | Admit: 2022-05-25 | Discharge: 2022-05-25 | Disposition: A | Discharge status: Home / Self Care | Payer: Medicare Other | Source: Ambulatory Visit | Attending: Internal Medicine | Admitting: Internal Medicine

## 2022-05-25 DIAGNOSIS — C7931 Secondary malignant neoplasm of brain: Secondary | ICD-10-CM | POA: Insufficient documentation

## 2022-05-25 MED ORDER — GADOBUTROL 1 MMOL/ML IV SOLN
6.0000 mL | Freq: Once | INTRAVENOUS | Status: AC | PRN
  Administered 2022-05-25: 6 mL via INTRAVENOUS

## 2022-05-25 NOTE — Progress Notes (Signed)
Patient here today at [facility] for MRI brain w wo contrast(SRS protocol). Patient is scanned here every 3-4 months. Patient has Product/process development scientist. [facility] with Joey-rep. Orders for VOO 90. Will re-program once scan is completed.

## 2022-06-01 ENCOUNTER — Ambulatory Visit (INDEPENDENT_AMBULATORY_CARE_PROVIDER_SITE_OTHER): Payer: Medicare Other

## 2022-06-01 DIAGNOSIS — I428 Other cardiomyopathies: Secondary | ICD-10-CM

## 2022-06-01 LAB — CUP PACEART REMOTE DEVICE CHECK
Battery Remaining Longevity: 36 mo
Battery Remaining Percentage: 42 %
Brady Statistic RA Percent Paced: 0 %
Brady Statistic RV Percent Paced: 100 %
Date Time Interrogation Session: 20240111000100
HighPow Impedance: 69 Ohm
Implantable Lead Connection Status: 753985
Implantable Lead Connection Status: 753985
Implantable Lead Connection Status: 753985
Implantable Lead Implant Date: 20180423
Implantable Lead Implant Date: 20180423
Implantable Lead Implant Date: 20180423
Implantable Lead Location: 753858
Implantable Lead Location: 753859
Implantable Lead Location: 753860
Implantable Lead Model: 292
Implantable Lead Model: 4672
Implantable Lead Model: 7740
Implantable Lead Serial Number: 431793
Implantable Lead Serial Number: 602825
Implantable Lead Serial Number: 703305
Implantable Pulse Generator Implant Date: 20180423
Lead Channel Impedance Value: 1064 Ohm
Lead Channel Impedance Value: 371 Ohm
Lead Channel Impedance Value: 439 Ohm
Lead Channel Pacing Threshold Amplitude: 0.5 V
Lead Channel Pacing Threshold Amplitude: 0.6 V
Lead Channel Pacing Threshold Pulse Width: 0.4 ms
Lead Channel Pacing Threshold Pulse Width: 0.4 ms
Lead Channel Setting Pacing Amplitude: 1.5 V
Lead Channel Setting Pacing Amplitude: 2 V
Lead Channel Setting Pacing Pulse Width: 0.4 ms
Lead Channel Setting Pacing Pulse Width: 0.4 ms
Lead Channel Setting Sensing Sensitivity: 0.6 mV
Lead Channel Setting Sensing Sensitivity: 1 mV
Pulse Gen Serial Number: 174046
Zone Setting Status: 755011

## 2022-06-05 ENCOUNTER — Inpatient Hospital Stay: Payer: Medicare Other | Attending: Oncology

## 2022-06-05 DIAGNOSIS — I110 Hypertensive heart disease with heart failure: Secondary | ICD-10-CM | POA: Insufficient documentation

## 2022-06-05 DIAGNOSIS — C649 Malignant neoplasm of unspecified kidney, except renal pelvis: Secondary | ICD-10-CM | POA: Insufficient documentation

## 2022-06-05 DIAGNOSIS — I509 Heart failure, unspecified: Secondary | ICD-10-CM | POA: Insufficient documentation

## 2022-06-05 DIAGNOSIS — C7931 Secondary malignant neoplasm of brain: Secondary | ICD-10-CM | POA: Insufficient documentation

## 2022-06-05 DIAGNOSIS — Z90710 Acquired absence of both cervix and uterus: Secondary | ICD-10-CM | POA: Insufficient documentation

## 2022-06-05 DIAGNOSIS — Z87891 Personal history of nicotine dependence: Secondary | ICD-10-CM | POA: Insufficient documentation

## 2022-06-06 ENCOUNTER — Inpatient Hospital Stay (HOSPITAL_BASED_OUTPATIENT_CLINIC_OR_DEPARTMENT_OTHER): Payer: Medicare Other | Admitting: Internal Medicine

## 2022-06-06 VITALS — BP 123/78 | HR 75 | Temp 97.0°F | Resp 18 | Wt 197.9 lb

## 2022-06-06 DIAGNOSIS — I110 Hypertensive heart disease with heart failure: Secondary | ICD-10-CM | POA: Diagnosis not present

## 2022-06-06 DIAGNOSIS — C649 Malignant neoplasm of unspecified kidney, except renal pelvis: Secondary | ICD-10-CM | POA: Diagnosis present

## 2022-06-06 DIAGNOSIS — Z87891 Personal history of nicotine dependence: Secondary | ICD-10-CM | POA: Diagnosis not present

## 2022-06-06 DIAGNOSIS — Z90710 Acquired absence of both cervix and uterus: Secondary | ICD-10-CM | POA: Diagnosis not present

## 2022-06-06 DIAGNOSIS — I509 Heart failure, unspecified: Secondary | ICD-10-CM | POA: Diagnosis not present

## 2022-06-06 DIAGNOSIS — C7931 Secondary malignant neoplasm of brain: Secondary | ICD-10-CM | POA: Diagnosis not present

## 2022-06-06 NOTE — Progress Notes (Signed)
[place] [facility] [place], [place] [address] [phone]   Interval Evaluation  Date of Service: 06/06/22 Patient Name: Jacqueline Palmer Patient MRN: [id number] Patient DOB: Apr 03, 1946 Provider: Ventura Sellers, MD  Identifying Statement:  Jacqueline Palmer is a 77 y.o. female with Malignant neoplasm metastatic to brain [facility]) [C79.31]   Primary Cancer: Renal Cell Carcinoma, Stage IV  Oncologic History: 07/30/19: Right frontal craniotomy, resection of solitary brain metastasis at [facility] 10/06/19: Post-operative SRS at [facility] (Dr. Tyrone Nine) 03/09/20: [place] SRS to R frontal+LMD 27/3x 02/08/21: Salvage SRS to L frontal Jacqueline Palmer)  Interval History:  Jacqueline Palmer presents today for follow up after recent MRI brain.  No clinical changes today. No recent seizures, continues to take Keppra '500mg'$  twice per day.  Continues to be active mentally and engaging socially.  H+P (02/24/20) Patient presents to review recent changes on brain MRI.  She initially presented in March 2021 with a seizure, described as "left side clenching up, lost awareness for a few minutes".  CNS imaging demonstrated an enhancing right frontal mass, c/w likely metastasis from renal cell carcinoma.  Craniotomy was performed at [facility] and followed with surgical bed radiosurgery at [facility].  She did have one recurrence of seizure activity in late August, at which time she was restarted on Keppra.  Unclear on why this was discontinued.  Recent MRI demonstrated some change and she presents today for treatment recommendations.  No neurologic complaints aside from seizures.  Medications: Current Outpatient Medications on File Prior to Visit  Medication Sig Dispense Refill   acetaminophen (TYLENOL) 500 MG tablet Take 1,000 mg by mouth in the morning and at bedtime.     colchicine 0.6 MG tablet TAKE ONE TABLET BY MOUTH DAILY AT BEDTIME 90 tablet 0   COVID-19 mRNA vaccine 2023-2024 (COMIRNATY)  syringe Inject into the muscle. (Patient not taking: Reported on 04/21/2022) 0.3 mL 0   hydrALAZINE (APRESOLINE) 10 MG tablet Take 1 tablet (10 mg total) by mouth 3 (three) times daily. 270 tablet 2   levETIRAcetam (KEPPRA) 500 MG tablet Take 1 tablet (500 mg total) by mouth 2 (two) times daily. 180 tablet 3   levothyroxine (SYNTHROID) 25 MCG tablet TAKE ONE TABLET BY MOUTH ONE TIME DAILY IN THE MORNING 30-60 MINUTES BEFORE BREAKFAST ON EMPTY STOMACH WITH FULL GLASS OF WATER 90 tablet 1   metoprolol succinate (TOPROL-XL) 25 MG 24 hr tablet Take 1 tablet (25 mg total) by mouth daily. 90 tablet 1   nitroGLYCERIN (NITROSTAT) 0.4 MG SL tablet Place 1 tablet (0.4 mg total) under the tongue every 5 (five) minutes as needed for chest pain. 25 tablet 3   OVER THE COUNTER MEDICATION Apply 1 application topically 2 (two) times daily. Pain cream in groin area     polyethylene glycol (MIRALAX / GLYCOLAX) 17 g packet Take 17 g by mouth daily as needed for mild constipation.     pravastatin (PRAVACHOL) 40 MG tablet Take 1 tablet (40 mg total) by mouth daily. 90 tablet 2   spironolactone (ALDACTONE) 25 MG tablet TAKE ONE TABLET BY MOUTH ONE TIME DAILY 90 tablet 1   torsemide (DEMADEX) 20 MG tablet TAKE 2 TABLETS BY MOUTH EVERY MORNING AND 1 TABLET EVERY EVENING 270 tablet 1   warfarin (COUMADIN) 5 MG tablet TAKE 1 TABLET BY MOUTH ONCE DAILY IN THE EVENING OR AS DIRECTED BY COUMADIN CLINIC 90 tablet 0   No current facility-administered medications on file prior to  visit.    Allergies:  Allergies  Allergen Reactions   Avocado Shortness Of Breath    Before 2007   Banana Anaphylaxis and Swelling   Plantain Anaphylaxis and Swelling   Ace Inhibitors Cough   Codeine Other (See Comments)    Nausea and Feels Jittery   Prednisone Swelling    Of face and lower extremities   Tape Hives    Tape  Does better with paper tape    Latex Rash   Pharmabase Cosmetic [[place]] Swelling and Rash    Some Soap,  perfumes,detergent   Past Medical History:  Past Medical History:  Diagnosis Date   Acute kidney injury ([place])    Arthritis    Atrial fibrillation ([place])    Cancer ([place])    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   Cardiomyopathy ([place])    CHF (congestive heart failure) (HCC)    Chronic a-fib ([place]) 08/14/2016   Chronic anticoagulation 08/14/2016   Coronary artery disease    4v CABG 6/17   Graves disease    Hypertension    Hypothyroidism    MVR 40m Edwards Perimount Plus Bioprosthetic 11/17/2015   SN [id number] [From patient's surgical info card]   Presence of permanent cardiac pacemaker    Seizures ([place]    Thrombus 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 myocardial infarction    Thyroid disease    Past Surgical History:  Past Surgical History:  Procedure Laterality Date   ABDOMINAL HYSTERECTOMY     CORONARY ARTERY BYPASS GRAFT     CRANIOTOMY Right 07/30/2019   Right frontal   NEPHRECTOMY Right 10/16/2019   TOTAL HIP ARTHROPLASTY Right 07/11/2021   Procedure: RIGHT TOTAL HIP ARTHROPLASTY ANTERIOR APPROACH;  Surgeon: RFrederik Pear MD;  Location: [facility];  Service: Orthopedics;  Laterality: Right;   Social History:  Social History   Socioeconomic History   Marital status: Widowed    Spouse name: Not on file   Number of children: 0   Years of education: Doctorate   Highest education level: Master's degree (e.g., MA, MS, MEng, MEd, MSW, MBA)  Occupational History   Occupation: Retired CClinical biochemist Tobacco Use   Smoking status: Former    Types: Cigarettes    Quit date: 07/30/2014    Years since quitting: 7.8   Smokeless tobacco: Never  Vaping Use   Vaping Use: Never used  Substance and Sexual Activity   Alcohol use: Yes    Comment: 1 glass wine twice weekly   Drug use: No   Sexual activity: Not on file  Other Topics Concern   Not on file  Social History Narrative   Right handed   Coffee daily/tea qod   Social Determinants of Health   Financial  Resource Strain: Low Risk  (04/21/2022)   Overall Financial Resource Strain (CARDIA)    Difficulty of Paying Living Expenses: Not hard at all  Food Insecurity: Unknown (04/21/2022)   Hunger Vital Sign    Worried About Running Out of Food in the Last Year: Never true    RSherrelwoodin the Last Year: Not on file  Transportation Needs: No Transportation Needs (04/21/2022)   PRAPARE - THydrologist(Medical): No    Lack of Transportation (Non-Medical): No  Physical Activity: Insufficiently Active (04/21/2022)   Exercise Vital Sign    Days of Exercise per Week: 3 days    Minutes of Exercise per Session: 30 min  Stress: No Stress Concern  Present (04/21/2022)   [place]    Feeling of Stress : Not at all  Social Connections: Moderately Integrated (04/21/2022)   Social Connection and Isolation Panel [NHANES]    Frequency of Communication with Friends and Family: More than three times a week    Frequency of Social Gatherings with Friends and Family: More than three times a week    Attends Religious Services: 1 to 4 times per year    Active Member of [employer] or Organizations: Yes    Attends Archivist Meetings: More than 4 times per year    Marital Status: Widowed  Intimate Partner Violence: Not At Risk (04/21/2022)   Humiliation, Afraid, Rape, and Kick questionnaire    Fear of Current or Ex-Partner: No    Emotionally Abused: No    Physically Abused: No    Sexually Abused: No   Family History:  Family History  Problem Relation Age of Onset   Heart attack Mother    Heart attack Father    Heart attack Sister    Breast cancer Neg Hx     Review of Systems: Constitutional: Doesn't report fevers, chills or abnormal weight loss Eyes: Doesn't report blurriness of vision Ears, nose, mouth, throat, and face: Doesn't report sore throat Respiratory: Doesn't report cough, dyspnea or  wheezes Cardiovascular: Doesn't report palpitation, chest discomfort  Gastrointestinal:  Doesn't report nausea, constipation, diarrhea GU: Doesn't report incontinence Skin: Doesn't report skin rashes Neurological: Per HPI Musculoskeletal: Doesn't report joint pain Behavioral/Psych: Doesn't report anxiety  Physical Exam:    04/03/2022   11:06 AM 03/28/2022   11:19 AM 03/17/2022   10:29 AM  Vitals with BMI  Height  '5\' 8"'$  '5\' 8"'$   Weight 192 lbs 4 oz 189 lbs 191 lbs  BMI 29.24 44.03 47.42  Systolic 595 638 756  Diastolic 77 78 70  Pulse 69 52 70     KPS: 80. General: Alert, cooperative, pleasant, in no acute distress Head: Normal EENT: No conjunctival injection or scleral icterus.  Lungs: Resp effort normal Cardiac: Regular rate Abdomen: Non-distended abdomen Skin: No rashes cyanosis or petechiae. Extremities: No clubbing or edema  Neurologic Exam: Mental Status: Awake, alert, attentive to examiner. Oriented to self and environment. Language is fluent with intact comprehension.  Cranial Nerves: Visual acuity is grossly normal. Visual fields are full. Extra-ocular movements intact. No ptosis. Face is symmetric Motor: Tone and bulk are normal. Power is full in both arms and legs. Reflexes are symmetric, no pathologic reflexes present.  Sensory: Intact to light touch Gait: Orthopedic limitations  Labs: I have reviewed the data as listed    Component Value Date/Time   NA 138 03/02/2022 0909   NA 141 12/14/2020 1053   K 3.8 03/02/2022 0909   CL 102 03/02/2022 0909   CO2 28 03/02/2022 0909   GLUCOSE 79 03/02/2022 0909   BUN 54 (H) 03/02/2022 0909   BUN 45 (H) 12/14/2020 1053   CREATININE 3.07 (HH) 03/02/2022 0909   CALCIUM 9.7 03/02/2022 0909   PROT 7.7 03/28/2022 1157   ALBUMIN 4.8 03/28/2022 1157   AST 24 03/28/2022 1157   AST 21 03/02/2022 0909   ALT 18 03/28/2022 1157   ALT 16 03/02/2022 0909   ALKPHOS 122 (H) 03/28/2022 1157   BILITOT 0.6 03/28/2022 1157    BILITOT 0.5 03/02/2022 0909   GFRNONAA 15 (L) 03/02/2022 0909   GFRAA 30 (L) 01/20/2020 1433   Lab Results  Component Value  Date   WBC 6.9 03/02/2022   NEUTROABS 2.6 03/02/2022   HGB 12.3 03/02/2022   HCT 37.6 03/02/2022   MCV 73.7 (L) 03/02/2022   PLT 254 03/02/2022    Imaging:  [place] Clinician Interpretation: I have personally reviewed the CNS images as listed.  My interpretation, in the context of the patient's clinical presentation, is stable disease  CUP PACEART REMOTE DEVICE CHECK  Result Date: 06/01/2022 Scheduled remote reviewed. Normal device function.  Next remote 91 days. LAMRI Protection last programmed on May 25, 2022. Beeper is OFF.  MR BRAIN W WO CONTRAST  Result Date: 05/27/2022 CLINICAL DATA:  Brain/CNS neoplasm, assess treatment response EXAM: MRI HEAD WITHOUT AND WITH CONTRAST TECHNIQUE: Multiplanar, multiecho pulse sequences of the brain and surrounding structures were obtained without and with intravenous contrast. CONTRAST:  46m GADAVIST GADOBUTROL 1 MMOL/ML IV SOLN COMPARISON:  MRI March 29, 2022. FINDINGS: Brain: The 4 mm focus of enhancement the posterolateral right frontal lobe is unchanged (series 18, image 116/117). No new areas of enhancement. Similar linear enhancement at the site of resection, compatible with postoperative change. Associated encephalomalacia in this region with overlying craniotomy. No evidence of acute infarct, acute hemorrhage, midline shift, hydrocephalus, or extra-axial fluid collection. Vascular: Major arterial flow voids are maintained at the skull base. Skull and upper cervical spine: Similar enhancing right frontal lesion, probably a metastasis with possible involvement of the adjacent dura. High right frontal craniotomy. Sinuses/Orbits: Clear sinuses. Remote right medial orbital wall fracture. No acute orbital findings. Other: No mastoid effusions. IMPRESSION: 1. The 4 mm focus of enhancement the posterolateral right frontal lobe is  unchanged. 2. No new areas of enhancement. 3. Unchanged area of cortical expansion and T2/FLAIR hyperintensity without enhancement at the right temporoparietal junction, potentially focal cortical dysplasia versus low-grade glioma. 4. 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 Electronically Signed   By: FMargaretha SheffieldM.D.   On: 05/27/2022 17:21     Assessment/Plan Malignant neoplasm metastatic to brain ([facility] [C79.31]  Jacqueline KALMARis clinically and radiographically stable today.  R frontal enhancing focus is stable from November[facility]  Continues on observation with Dr. SAlen Blew    Recommended continuing Keppra at '500mg'$  BID.    We appreciate the opportunity to participate in the care of AAIRLIE Palmer    We ask that Jacqueline ANDUJOreturn to clinic in 4 months following next brain MRI, or sooner as needed.  All questions were answered. The patient knows to call the clinic with any problems, questions or concerns. No barriers to learning were detected.  The total time spent in the encounter was 30 minutes and more than 50% was on counseling and review of test results   ZVentura Sellers MD Medical Director of Neuro-Oncology [facility]01/16/24 11:18 AM

## 2022-06-08 ENCOUNTER — Other Ambulatory Visit: Payer: Self-pay | Admitting: Radiation Therapy

## 2022-06-12 ENCOUNTER — Other Ambulatory Visit: Payer: Self-pay | Admitting: *Deleted

## 2022-06-12 DIAGNOSIS — C7931 Secondary malignant neoplasm of brain: Secondary | ICD-10-CM

## 2022-06-15 ENCOUNTER — Other Ambulatory Visit: Payer: Self-pay | Admitting: Cardiology

## 2022-06-19 ENCOUNTER — Encounter (HOSPITAL_BASED_OUTPATIENT_CLINIC_OR_DEPARTMENT_OTHER): Payer: Self-pay | Admitting: Cardiology

## 2022-06-21 ENCOUNTER — Telehealth (INDEPENDENT_AMBULATORY_CARE_PROVIDER_SITE_OTHER): Payer: Medicare Other | Admitting: Family Medicine

## 2022-06-21 ENCOUNTER — Encounter: Payer: Self-pay | Admitting: Family Medicine

## 2022-06-21 DIAGNOSIS — R569 Unspecified convulsions: Secondary | ICD-10-CM | POA: Diagnosis not present

## 2022-06-21 MED ORDER — LEVETIRACETAM 500 MG PO TABS: 500.0000 mg | ORAL_TABLET | Freq: Two times a day (BID) | ORAL | 3 refills | Status: DC

## 2022-06-21 NOTE — Progress Notes (Signed)
PATIENT: Jacqueline Palmer DOB: 25-Jul-1945  REASON FOR VISIT: follow up HISTORY FROM: patient  Virtual Visit via Telephone Note  I connected with Jacqueline Palmer on 06/21/22 at 10:30 AM EST by telephone and verified that I am speaking with the correct person using two identifiers.   I discussed the limitations, risks, security and privacy concerns of performing an evaluation and management service by telephone and the availability of in person appointments. I also discussed with the patient that there may be a patient responsible charge related to this service. The patient expressed understanding and agreed to proceed.   History of Present Illness:  06/21/22 ALL ([place]): Jacqueline Palmer is a 77 y.o. female here today for follow up seizures. She continues levetiracetam '500mg'$  BID. She continues to do well. No seizures. She is followed every 3-4 months with oncology with MRIs for monitoring. She sees Dr Wynetta Emery for PCP needs. She is followed by cardiology and nephrology as well. She continues to like at [facility] in independent living. She has two nephews in [place] and more family that live about 40 miles away. She is able to walk around her apartment without assistive device. She uses a cane for longer distance walking.   06/21/21 ALL:  Jacqueline Palmer is a 77 y.o. female here today for follow up for seizures in setting of  right frontal brain mets. She remains on Keppra 500 gm BID. She is doing well. No seizures. She lives in [place]. She does not drive. She continues to see oncology regularly. She has MRI every three months for monitoring. She is followed by cardiology for atrial fib. S/P pace maker. She reports doing well from cardiovascular standpoint. She is planning to have right hip replacement 07/11/21. She is using cane for stability.    Observations/Objective:  Generalized: Well developed, in no acute distress  Mentation: Alert oriented to time, place, history  taking. Follows all commands speech and language fluent   Assessment and Plan:  77 y.o. year old female  has a past medical history of Acute kidney injury ([place]), Arthritis, Atrial fibrillation ([place]), Cancer ([place]), Cardiomyopathy ([place]), CHF (congestive heart failure) ([place]), Chronic a-fib ([place]) (08/14/2016), Chronic anticoagulation (08/14/2016), Coronary artery disease, Graves disease, Hypertension, Hypothyroidism, MVR 65m Edwards Perimount Plus Bioprosthetic (11/17/2015), Presence of permanent cardiac pacemaker, Seizures ([place], Thrombus 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 myocardial infarction, and Thyroid disease. here with    ICD-10-CM   1. Seizures ([place]  R56.9       AAmbrieis doing well. We will continue levetiracetam '500mg'$  twice daily. Seizure precautions advised. She will continue follow up with care team as advised. Healthy lifestyle habits encouraged. She will return for follow up with me in 1 year, sooner if needed.   No orders of the defined types were placed in this encounter.   Meds ordered this encounter  Medications   levETIRAcetam (KEPPRA) 500 MG tablet    Sig: Take 1 tablet (500 mg total) by mouth 2 (two) times daily.    Dispense:  180 tablet    Refill:  3    Order Specific Question:   Supervising Provider    Answer:   AMelvenia Beam[[id number]    Follow Up Instructions:  I discussed the assessment and treatment plan with the patient. The patient was provided an opportunity to ask questions and all were answered. The patient agreed with the plan and demonstrated an understanding of the instructions.   The patient was advised  to call back or seek an in-person evaluation if the symptoms worsen or if the condition fails to improve as anticipated.  I provided 15 minutes of non-face-to-face time during this encounter. Patient located at their place of residence during [place] visit. Provider is in the office.    Debbora Presto, NP

## 2022-06-21 NOTE — Patient Instructions (Signed)
Below is our plan:  We will continue levetiracetam '500mg'$  twice daily   Please make sure you are consistent with timing of seizure medication. I recommend annual visit with primary care provider (PCP) for complete physical and routine blood work. I recommend daily intake of vitamin D (400-800iu) and calcium (800-'1000mg'$ ) for bone health. Discuss Dexa screening with PCP.   According to [place] law, you can not drive unless you are seizure / syncope free for at least 6 months and under physician's care.  Please maintain precautions. Do not participate in activities where a loss of awareness could harm you or someone else. No swimming alone, no tub bathing, no hot tubs, no driving, no operating motorized vehicles (cars, ATVs, motocycles, etc), lawnmowers, power tools or firearms. No standing at heights, such as rooftops, ladders or stairs. Avoid hot objects such as stoves, heaters, open fires. Wear a helmet when riding a bicycle, scooter, skateboard, etc. and avoid areas of traffic. Set your water heater to 120 degrees or less.  Please make sure you are staying well hydrated. I recommend 50-60 ounces daily. Well balanced diet and regular exercise encouraged. Consistent sleep schedule with 6-8 hours recommended.   Please continue follow up with care team as directed.   Follow up with me in 1 year  You may receive a survey regarding today's visit. I encourage you to leave honest feed back as I do use this information to improve patient care. Thank you for seeing me today!

## 2022-06-21 NOTE — Progress Notes (Signed)
Remote ICD transmission.   

## 2022-06-22 ENCOUNTER — Ambulatory Visit: Payer: Medicare Other | Attending: Internal Medicine

## 2022-06-22 DIAGNOSIS — I482 Chronic atrial fibrillation, unspecified: Secondary | ICD-10-CM | POA: Diagnosis not present

## 2022-06-22 DIAGNOSIS — Z7901 Long term (current) use of anticoagulants: Secondary | ICD-10-CM | POA: Insufficient documentation

## 2022-06-22 LAB — POCT INR: INR: 2.9 (ref 2.0–3.0)

## 2022-06-22 NOTE — Patient Instructions (Signed)
Description   Continue taking Warfarin 1 tablet daily.  Recheck INR in 6 weeks.  Coumadin Clinic [phone]

## 2022-07-05 ENCOUNTER — Other Ambulatory Visit: Payer: Self-pay | Admitting: Internal Medicine

## 2022-07-06 ENCOUNTER — Other Ambulatory Visit: Payer: Self-pay | Admitting: Internal Medicine

## 2022-07-06 NOTE — Telephone Encounter (Signed)
Unable to refill per protocol, Rx request was refilled 07/06/22. Will refuse duplicate request.  Requested Prescriptions  Pending Prescriptions Disp Refills   colchicine 0.6 MG tablet [Pharmacy Med Name: Colchicine Oral Tablet 0.6 MG] 90 tablet 0    Sig: TAKE ONE TABLET BY MOUTH DAILY AT BEDTIME     Endocrinology:  Gout Agents - colchicine Failed - 07/06/2022  2:05 PM      Failed - Cr in normal range and within 360 days    Creatinine  Date Value Ref Range Status  03/02/2022 3.07 (HH) 0.44 - 1.00 mg/dL Final    Comment:    REPEATED TO VERIFY CRITICAL RESULT CALLED TO, READ BACK BY AND VERIFIED WITH: TAMMI HOLLAND, RN @ 1006 ON 10.12.2023 BY LAUREN BOWMAN, MLS           Failed - CBC within normal limits and completed in the last 12 months    WBC  Date Value Ref Range Status  07/14/2021 9.9 4.0 - 10.5 K/uL Final   WBC Count  Date Value Ref Range Status  03/02/2022 6.9 4.0 - 10.5 K/uL Final   RBC  Date Value Ref Range Status  03/02/2022 5.10 3.87 - 5.11 MIL/uL Final   Hemoglobin  Date Value Ref Range Status  03/02/2022 12.3 12.0 - 15.0 g/dL Final  08/18/2021 12.4 11.1 - 15.9 g/dL Final   HCT  Date Value Ref Range Status  03/02/2022 37.6 36.0 - 46.0 % Final   Hematocrit  Date Value Ref Range Status  08/18/2021 39.5 34.0 - 46.6 % Final   MCHC  Date Value Ref Range Status  03/02/2022 32.7 30.0 - 36.0 g/dL Final   [facility]  Date Value Ref Range Status  03/02/2022 24.1 (L) 26.0 - 34.0 pg Final   MCV  Date Value Ref Range Status  03/02/2022 73.7 (L) 80.0 - 100.0 fL Final  08/18/2021 82 79 - 97 fL Final   No results found for: "PLTCOUNTKUC", "LABPLAT", "POCPLA" RDW  Date Value Ref Range Status  03/02/2022 14.4 11.5 - 15.5 % Final  08/18/2021 16.0 (H) 11.7 - 15.4 % Final         Passed - ALT in normal range and within 360 days    ALT  Date Value Ref Range Status  03/28/2022 18 0 - 32 IU/L Final  03/02/2022 16 0 - 44 U/L Final         Passed - AST in normal range  and within 360 days    AST  Date Value Ref Range Status  03/28/2022 24 0 - 40 IU/L Final  03/02/2022 21 15 - 41 U/L Final         Passed - Valid encounter within last 12 months    Recent Outpatient Visits           3 months ago Essential hypertension   [place] Ladell Pier, MD   10 months ago Essential hypertension   [place] Ladell Pier, MD   1 year ago Essential hypertension   [place] Ladell Pier, MD   1 year ago COVID-19 virus infection   [place] Ladell Pier, MD   1 year ago Encounter for [employer] annual wellness exam   [place], MD       Future Appointments  In 3 weeks Ladell Pier, MD [place]   In 11 months Ubaldo Glassing, Amy, NP [facility]

## 2022-07-12 ENCOUNTER — Other Ambulatory Visit: Payer: Self-pay

## 2022-07-12 DIAGNOSIS — C7931 Secondary malignant neoplasm of brain: Secondary | ICD-10-CM

## 2022-07-27 ENCOUNTER — Ambulatory Visit: Payer: Self-pay | Admitting: Internal Medicine

## 2022-07-28 LAB — LAB REPORT - SCANNED: eGFR: 22

## 2022-08-02 ENCOUNTER — Encounter: Payer: Self-pay | Admitting: Internal Medicine

## 2022-08-02 ENCOUNTER — Ambulatory Visit: Payer: Medicare Other | Admitting: Physician Assistant

## 2022-08-02 NOTE — Progress Notes (Signed)
Received labs from her nephrologist that were done 07/27/2022. Intact PTH 164 TSH 3.13, free T4 1.51 GFR 22.  Creatinine 2.27 H/H11.6/34.9

## 2022-08-03 ENCOUNTER — Ambulatory Visit: Payer: Medicare Other

## 2022-08-08 ENCOUNTER — Telehealth: Payer: Self-pay | Admitting: Internal Medicine

## 2022-08-08 NOTE — Telephone Encounter (Signed)
Called patient regarding April/May appointments, patient is notified.

## 2022-08-10 ENCOUNTER — Other Ambulatory Visit: Payer: Self-pay | Admitting: Cardiology

## 2022-08-10 ENCOUNTER — Ambulatory Visit: Payer: Medicare Other | Attending: Cardiology

## 2022-08-10 DIAGNOSIS — I482 Chronic atrial fibrillation, unspecified: Secondary | ICD-10-CM | POA: Diagnosis present

## 2022-08-10 DIAGNOSIS — Z7901 Long term (current) use of anticoagulants: Secondary | ICD-10-CM | POA: Diagnosis present

## 2022-08-10 LAB — POCT INR: INR: 1.7 — AB (ref 2.0–3.0)

## 2022-08-10 NOTE — Patient Instructions (Signed)
Description   Take 2 tablets today and then continue taking Warfarin 1 tablet daily.  Recheck INR in 3 weeks.  Coumadin Clinic [phone]

## 2022-08-11 NOTE — Telephone Encounter (Signed)
Rx request sent to pharmacy.  

## 2022-08-11 NOTE — Telephone Encounter (Signed)
Please review for refill. Thank you! 

## 2022-08-14 ENCOUNTER — Encounter: Payer: Self-pay | Admitting: Internal Medicine

## 2022-08-14 ENCOUNTER — Ambulatory Visit: Payer: Medicare Other | Attending: Internal Medicine | Admitting: Internal Medicine

## 2022-08-14 VITALS — BP 130/77 | HR 70 | Temp 97.7°F | Wt 196.0 lb

## 2022-08-14 DIAGNOSIS — Z79890 Hormone replacement therapy (postmenopausal): Secondary | ICD-10-CM | POA: Diagnosis not present

## 2022-08-14 DIAGNOSIS — I7121 Aneurysm of the ascending aorta, without rupture: Secondary | ICD-10-CM | POA: Diagnosis not present

## 2022-08-14 DIAGNOSIS — M109 Gout, unspecified: Secondary | ICD-10-CM | POA: Insufficient documentation

## 2022-08-14 DIAGNOSIS — Z86010 Personal history of colonic polyps: Secondary | ICD-10-CM | POA: Diagnosis not present

## 2022-08-14 DIAGNOSIS — I2510 Atherosclerotic heart disease of native coronary artery without angina pectoris: Secondary | ICD-10-CM | POA: Insufficient documentation

## 2022-08-14 DIAGNOSIS — R569 Unspecified convulsions: Secondary | ICD-10-CM

## 2022-08-14 DIAGNOSIS — I10 Essential (primary) hypertension: Secondary | ICD-10-CM

## 2022-08-14 DIAGNOSIS — I5042 Chronic combined systolic (congestive) and diastolic (congestive) heart failure: Secondary | ICD-10-CM | POA: Insufficient documentation

## 2022-08-14 DIAGNOSIS — N184 Chronic kidney disease, stage 4 (severe): Secondary | ICD-10-CM | POA: Diagnosis not present

## 2022-08-14 DIAGNOSIS — I25718 Atherosclerosis of autologous vein coronary artery bypass graft(s) with other forms of angina pectoris: Secondary | ICD-10-CM | POA: Insufficient documentation

## 2022-08-14 DIAGNOSIS — E0500 Thyrotoxicosis with diffuse goiter without thyrotoxic crisis or storm: Secondary | ICD-10-CM | POA: Insufficient documentation

## 2022-08-14 DIAGNOSIS — G40909 Epilepsy, unspecified, not intractable, without status epilepticus: Secondary | ICD-10-CM | POA: Insufficient documentation

## 2022-08-14 DIAGNOSIS — I130 Hypertensive heart and chronic kidney disease with heart failure and stage 1 through stage 4 chronic kidney disease, or unspecified chronic kidney disease: Secondary | ICD-10-CM | POA: Diagnosis not present

## 2022-08-14 DIAGNOSIS — I482 Chronic atrial fibrillation, unspecified: Secondary | ICD-10-CM

## 2022-08-14 DIAGNOSIS — I5032 Chronic diastolic (congestive) heart failure: Secondary | ICD-10-CM

## 2022-08-14 DIAGNOSIS — Z1231 Encounter for screening mammogram for malignant neoplasm of breast: Secondary | ICD-10-CM | POA: Diagnosis not present

## 2022-08-14 DIAGNOSIS — Z79899 Other long term (current) drug therapy: Secondary | ICD-10-CM | POA: Diagnosis not present

## 2022-08-14 DIAGNOSIS — I4821 Permanent atrial fibrillation: Secondary | ICD-10-CM | POA: Insufficient documentation

## 2022-08-14 DIAGNOSIS — Z7901 Long term (current) use of anticoagulants: Secondary | ICD-10-CM | POA: Insufficient documentation

## 2022-08-14 DIAGNOSIS — Z1211 Encounter for screening for malignant neoplasm of colon: Secondary | ICD-10-CM

## 2022-08-14 DIAGNOSIS — Z87891 Personal history of nicotine dependence: Secondary | ICD-10-CM | POA: Insufficient documentation

## 2022-08-14 DIAGNOSIS — E890 Postprocedural hypothyroidism: Secondary | ICD-10-CM

## 2022-08-14 NOTE — Progress Notes (Signed)
Patient ID: [name], female    DOB: 07/19/1945  MRN: [id number]  CC: Hypertension (HTN f/u./Requesting completion of forms. Jacqueline Palmer received flu vax. Yes to shingles. )   Subjective: Jacqueline Palmer is a 77 y.o. female who presents for chronic ds management Her concerns today include:  Patient with history of CAD with history CABGx4, chronic diastolic CHF/NICM with BV-ICD, HTN, rheumatic heart disease status post MVR replacement, permanent A. 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     HM:  received 1st shingles at [facility].  Reports she will be due for c-scope this yr; received letter from [place] her of this; last one was 3-4 yrs ago through them. Had polyps removed. Does not want to go back there.   She brings with her a form from the [place].  She is attempting to get financial support/survivors benefits from them.  Her husband was a English as a second language teacher.  Form seems to indicate that she needs to be homebound and needs assistance with ADLs in her home.  Patient currently stays in an independent living facility.  She tells me that she is independent in her ADLs and is not confined to her apartment.  She still drives short distances like to the grocery store and to pick up medications at the pharmacy.  Other times she uses transportation provided through her facility.  She has no incontinence of bowel or bladder.  She ambulates with her cane when in public and with her rollator walker at home.  She has not had any falls.  Seizure disorder: Seen by neurology in January.  She is stable on Keppra.  No seizures.  CKD 4: Seen by her nephrologist Dr. Candiss Palmer earlier this month.  GFR was 22.  H/H 11.6/34.9 and parathyroid hormone level was 164. He checked her thyroid level and TSH was normal.  She continues on levothyroxine 25 mcg  daily.  HTN/CAD/A-fib: No bruising or bleeding on Coumadin.  Compliant with other medications including hydralazine 10 mg 3 times a day, metoprolol 25 mg daily, pravastatin 40 mg daily, spironolactone 25 mg daily and torsemide 2 tablets by mouth every morning and 1 tablet in the evenings.  She has not had any chest pains, shortness of breath or palpitations. She has an ascending aortic aneurysm that is being monitored by cardiology.  Patient Active Problem List   Diagnosis Date Noted   Avascular necrosis of bone of right hip ([place]) 07/11/2021   Advance directive in chart 04/24/2021   Ascending aortic aneurysm ([place]) 04/24/2021   Gait disturbance 04/19/2021   Acute-on-chronic kidney injury ([place]) 12/03/2020   Subtherapeutic international normalized ratio (INR) 12/03/2020   Chest pain 12/02/2020   Neuropathic pain 11/25/2020   Seizures ([place]) 06/16/2020   Over weight 05/03/2020   Nonischemic cardiomyopathy ([place]) 03/04/2020   Biventricular automatic implantable cardioverter defibrillator in situ 02/19/2020   Chronic diastolic heart failure ([place]) 02/19/2020   Hx of CABG 02/19/2020   Coronary artery disease involving native coronary artery of native heart without angina pectoris 02/19/2020   Essential hypertension 02/19/2020   Permanent atrial fibrillation ([place]) 02/19/2020   CKD (chronic kidney disease) stage 4, GFR 15-29 ml/min ([place]) 02/19/2020   Renal cell carcinoma of right kidney ([place]) 02/12/2020   Malignant neoplasm metastatic to brain ([place]) 02/12/2020   Gout attack 08/14/2016   Chronic a-fib ([place]) 08/14/2016  Chronic anticoagulation 08/14/2016   Acute on chronic systolic heart failure ([place]) 08/09/2016   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     Current Outpatient Medications on File Prior to Visit  Medication Sig Dispense Refill   acetaminophen (TYLENOL) 500 MG tablet Take 1,000 mg by mouth in the morning and at bedtime.     colchicine 0.6 MG tablet TAKE ONE TABLET BY MOUTH DAILY AT  BEDTIME 90 tablet 0   COVID-19 mRNA vaccine 2023-2024 (COMIRNATY) syringe Inject into the muscle. 0.3 mL 0   hydrALAZINE (APRESOLINE) 10 MG tablet Take 1 tablet (10 mg total) by mouth 3 (three) times daily. 270 tablet 2   levETIRAcetam (KEPPRA) 500 MG tablet Take 1 tablet (500 mg total) by mouth 2 (two) times daily. 180 tablet 3   levothyroxine (SYNTHROID) 25 MCG tablet TAKE ONE TABLET BY MOUTH ONE TIME DAILY IN THE MORNING 30-60 MINUTES BEFORE BREAKFAST ON EMPTY STOMACH WITH FULL GLASS OF WATER 90 tablet 1   metoprolol succinate (TOPROL-XL) 25 MG 24 hr tablet Take 1 tablet (25 mg total) by mouth daily. 90 tablet 1   nitroGLYCERIN (NITROSTAT) 0.4 MG SL tablet Place 1 tablet (0.4 mg total) under the tongue every 5 (five) minutes as needed for chest pain. 25 tablet 3   OVER THE COUNTER MEDICATION Apply 1 application topically 2 (two) times daily. Pain cream in groin area     polyethylene glycol (MIRALAX / GLYCOLAX) 17 g packet Take 17 g by mouth daily as needed for mild constipation.     pravastatin (PRAVACHOL) 40 MG tablet Take 1 tablet (40 mg total) by mouth daily. 90 tablet 2   spironolactone (ALDACTONE) 25 MG tablet TAKE ONE TABLET BY MOUTH ONE TIME DAILY 90 tablet 0   torsemide (DEMADEX) 20 MG tablet TAKE 2 TABLETS BY MOUTH EVERY MORNING AND 1 TABLET EVERY EVENING 270 tablet 1   warfarin (COUMADIN) 5 MG tablet TAKE ONE TABLET BY MOUTH ONE TIME DAILY IN THE EVENING OR AS DIRECTED BY COUMADIN CLINIC 100 tablet 0   No current facility-administered medications on file prior to visit.    Allergies  Allergen Reactions   Avocado Shortness Of Breath    Before 2007   Banana Anaphylaxis and Swelling   Plantain Anaphylaxis and Swelling   Ace Inhibitors Cough   Codeine Other (See Comments)    Nausea and Feels Jittery   Prednisone Swelling    Of face and lower extremities   Tape Hives    Tape  Does better with paper tape    Latex Rash   Pharmabase Cosmetic [[place]] Swelling and Rash    Some  Soap, perfumes,detergent    Social History   Socioeconomic History   Marital status: Widowed    Spouse name: Not on file   Number of children: 0   Years of education: Doctorate   Highest education level: Master's degree (e.g., MA, MS, MEng, MEd, MSW, MBA)  Occupational History   Occupation: Retired Clinical biochemist  Tobacco Use   Smoking status: Former    Types: Cigarettes    Quit date: 07/30/2014    Years since quitting: 8.0   Smokeless tobacco: Never  Vaping Use   Vaping Use: Never used  Substance and Sexual Activity   Alcohol use: Yes    Comment: 1 glass wine twice weekly   Drug use: No   Sexual activity: Not on file  Other Topics Concern   Not on file  Social History Narrative   Right handed   Coffee daily/tea  qod   Social Determinants of Health   Financial Resource Strain: Low Risk  (04/21/2022)   Overall Financial Resource Strain (CARDIA)    Difficulty of Paying Living Expenses: Not hard at all  Food Insecurity: Unknown (04/21/2022)   Hunger Vital Sign    Worried About Running Out of Food in the Last Year: Never true    Ran Out of Food in the Last Year: Not on file  Transportation Needs: No Transportation Needs (04/21/2022)   PRAPARE - Hydrologist (Medical): No    Lack of Transportation (Non-Medical): No  Physical Activity: Insufficiently Active (04/21/2022)   Exercise Vital Sign    Days of Exercise per Week: 3 days    Minutes of Exercise per Session: 30 min  Stress: No Stress Concern Present (04/21/2022)   [place]    Feeling of Stress : Not at all  Social Connections: Moderately Integrated (04/21/2022)   Social Connection and Isolation Panel [NHANES]    Frequency of Communication with Friends and Family: More than three times a week    Frequency of Social Gatherings with Friends and Family: More than three times a week    Attends Religious Services: 1 to 4 times per year     Active Member of [employer] or Organizations: Yes    Attends Archivist Meetings: More than 4 times per year    Marital Status: Widowed  Intimate Partner Violence: Not At Risk (04/21/2022)   Humiliation, Afraid, Rape, and Kick questionnaire    Fear of Current or Ex-Partner: No    Emotionally Abused: No    Physically Abused: No    Sexually Abused: No    Family History  Problem Relation Age of Onset   Heart attack Mother    Heart attack Father    Heart attack Sister    Breast cancer Neg Hx     Past Surgical History:  Procedure Laterality Date   ABDOMINAL HYSTERECTOMY     CORONARY ARTERY BYPASS GRAFT     CRANIOTOMY Right 07/30/2019   Right frontal   NEPHRECTOMY Right 10/16/2019   TOTAL HIP ARTHROPLASTY Right 07/11/2021   Procedure: RIGHT TOTAL HIP ARTHROPLASTY ANTERIOR APPROACH;  Surgeon: Frederik Pear, MD;  Location: [facility];  Service: Orthopedics;  Laterality: Right;    ROS: Review of Systems Negative except as stated above  PHYSICAL EXAM: BP 130/77 (BP Location: Left Arm, Patient Position: Sitting, Cuff Size: Normal)   Pulse 70   Temp 97.7 F (36.5 C) (Oral)   Wt 196 lb (88.9 kg)   SpO2 99%   BMI 29.80 kg/m   Wt Readings from Last 3 Encounters:  08/14/22 196 lb (88.9 kg)  06/06/22 197 lb 14.4 oz (89.8 kg)  04/03/22 192 lb 4 oz (87.2 kg)    Physical Exam  General appearance - alert, well appearing, elderly African-American female and in no distress Mental status - normal mood, behavior, speech, dress, motor activity, and thought processes Neck - supple, no significant adenopathy Chest - clear to auscultation, no wheezes, rales or rhonchi, symmetric air entry Heart -rate control. Neurological -gait is stable.  She ambulates with a cane.  No assistance needed with her getting up on the exam table.  Grip 5/5 bilaterally power in upper extremities 5/5 bilaterally. Musculoskeletal -knees: No point tenderness.  Good range of motion.  Hips Limited flexion but  good extension and rotation. Extremities -no lower extremity edema.  Latest Ref Rng & Units 03/28/2022   11:57 AM 03/02/2022    9:09 AM 08/23/2021   11:35 AM  CMP  Glucose 70 - 99 mg/dL  79  80   BUN 8 - 23 mg/dL  54  44   Creatinine 0.44 - 1.00 mg/dL  3.07  2.58   Sodium 135 - 145 mmol/L  138  137   Potassium 3.5 - 5.1 mmol/L  3.8  4.4   Chloride 98 - 111 mmol/L  102  100   CO2 22 - 32 mmol/L  28  28   Calcium 8.9 - 10.3 mg/dL  9.7  10.1   Total Protein 6.0 - 8.5 g/dL 7.7  8.0  8.0   Total Bilirubin 0.0 - 1.2 mg/dL 0.6  0.5  0.7   Alkaline Phos 44 - 121 IU/L 122  122  102   AST 0 - 40 IU/L 24  21  17    ALT 0 - 32 IU/L 18  16  11     Lipid Panel     Component Value Date/Time   CHOL 181 03/28/2022 1157   TRIG 118 03/28/2022 1157   HDL 64 03/28/2022 1157   CHOLHDL 2.8 03/28/2022 1157   LDLCALC 96 03/28/2022 1157    CBC    Component Value Date/Time   WBC 6.9 03/02/2022 0909   WBC 9.9 07/14/2021 0329   RBC 5.10 03/02/2022 0909   HGB 12.3 03/02/2022 0909   HGB 12.4 08/18/2021 1219   HCT 37.6 03/02/2022 0909   HCT 39.5 08/18/2021 1219   PLT 254 03/02/2022 0909   PLT 292 08/18/2021 1219   MCV 73.7 (L) 03/02/2022 0909   MCV 82 08/18/2021 1219   MCH 24.1 (L) 03/02/2022 0909   MCHC 32.7 03/02/2022 0909   RDW 14.4 03/02/2022 0909   RDW 16.0 (H) 08/18/2021 1219   LYMPHSABS 3.2 03/02/2022 0909   MONOABS 0.9 03/02/2022 0909   EOSABS 0.2 03/02/2022 0909   BASOSABS 0.0 03/02/2022 0909    ASSESSMENT AND PLAN: 1. Essential hypertension At goal.  Continue hydralazine, Toprol XL, spironolactone and torsemide.  2. Chronic diastolic heart failure (HCC) Stable and compensated.  Continue hydralazine, Toprol XL, spironolactone and torsemide.  3. CKD (chronic kidney disease) stage 4, GFR 15-29 ml/min (HCC) Patient being monitored by nephrology.  Stable stage IV.  4. Aneurysm of ascending aorta without rupture (HCC) No chest pains.  Being monitored by cardiology.  5.  Chronic a-fib (HCC) Stable and rate controlled.  Followed by Coumadin clinic through cardiology.  6. CAD in native artery Stable.  Continue Toprol and pravastatin  7. Seizures (HCC) Stable.  Continue Keppra  8. Postablative hypothyroidism Controlled.  Continue levothyroxine 25 mcg daily.  9. Encounter for screening mammogram for malignant neoplasm of breast - MM Digital Screening; Future  10. Screening for colon cancer - Ambulatory referral to Gastroenterology  11. History of colon polyps - Ambulatory referral to Gastroenterology  I will complete form for her for the [place].  I am not sure whether she will qualify given that she is independent in ADLs.  Form seems to indicate that patient would need to be homebound and need assistance with ADLs.  Patient was given the opportunity to ask questions.  Patient verbalized understanding of the plan and was able to repeat key elements of the plan.   This documentation was completed using Radio producer.  Any transcriptional errors are unintentional.  No orders of the defined types were placed in this  encounter.    Requested Prescriptions    No prescriptions requested or ordered in this encounter    No follow-ups on file.  Jacqueline Plumber, MD, FACP

## 2022-08-22 ENCOUNTER — Ambulatory Visit
Admission: RE | Admit: 2022-08-22 | Discharge: 2022-08-22 | Disposition: A | Discharge status: Home / Self Care | Payer: Medicare Other | Source: Ambulatory Visit | Attending: Internal Medicine | Admitting: Internal Medicine

## 2022-08-22 DIAGNOSIS — Z1231 Encounter for screening mammogram for malignant neoplasm of breast: Secondary | ICD-10-CM

## 2022-08-23 ENCOUNTER — Encounter: Payer: Self-pay | Admitting: Internal Medicine

## 2022-08-23 NOTE — Progress Notes (Signed)
I received patient's records from [facility]. Colonoscopy report was not enclosed but the pathology report from the colonoscopy that was done 08/03/2017 was not closed.  She had to tubular adenomas removed 1 from the ascending colon and one in the descending colon.  Plan was for repeat colonoscopy in 5 years which would have been 07/2022.  Bone density study was done 07/04/2018.  This revealed osteopenia at the lumbar spine with T-score of -2.2 and at the right femoral with T-score of -2.2.

## 2022-08-31 ENCOUNTER — Ambulatory Visit (INDEPENDENT_AMBULATORY_CARE_PROVIDER_SITE_OTHER): Payer: Medicare Other

## 2022-08-31 ENCOUNTER — Ambulatory Visit: Payer: Medicare Other | Attending: Cardiology | Admitting: *Deleted

## 2022-08-31 DIAGNOSIS — I482 Chronic atrial fibrillation, unspecified: Secondary | ICD-10-CM | POA: Insufficient documentation

## 2022-08-31 DIAGNOSIS — Z7901 Long term (current) use of anticoagulants: Secondary | ICD-10-CM | POA: Insufficient documentation

## 2022-08-31 DIAGNOSIS — I428 Other cardiomyopathies: Secondary | ICD-10-CM

## 2022-08-31 LAB — CUP PACEART REMOTE DEVICE CHECK
Battery Remaining Longevity: 36 mo
Battery Remaining Percentage: 40 %
Brady Statistic RA Percent Paced: 0 %
Brady Statistic RV Percent Paced: 100 %
Date Time Interrogation Session: 20240411000100
HighPow Impedance: 72 Ohm
Implantable Lead Connection Status: 753985
Implantable Lead Connection Status: 753985
Implantable Lead Connection Status: 753985
Implantable Lead Implant Date: 20180423
Implantable Lead Implant Date: 20180423
Implantable Lead Implant Date: 20180423
Implantable Lead Location: 753858
Implantable Lead Location: 753859
Implantable Lead Location: 753860
Implantable Lead Model: 292
Implantable Lead Model: 4672
Implantable Lead Model: 7740
Implantable Lead Serial Number: 431793
Implantable Lead Serial Number: 602825
Implantable Lead Serial Number: 703305
Implantable Pulse Generator Implant Date: 20180423
Lead Channel Impedance Value: 1015 Ohm
Lead Channel Impedance Value: 355 Ohm
Lead Channel Impedance Value: 428 Ohm
Lead Channel Pacing Threshold Amplitude: 0.6 V
Lead Channel Pacing Threshold Amplitude: 0.7 V
Lead Channel Pacing Threshold Pulse Width: 0.4 ms
Lead Channel Pacing Threshold Pulse Width: 0.4 ms
Lead Channel Setting Pacing Amplitude: 1.6 V
Lead Channel Setting Pacing Amplitude: 2 V
Lead Channel Setting Pacing Pulse Width: 0.4 ms
Lead Channel Setting Pacing Pulse Width: 0.4 ms
Lead Channel Setting Sensing Sensitivity: 0.6 mV
Lead Channel Setting Sensing Sensitivity: 1 mV
Pulse Gen Serial Number: 174046
Zone Setting Status: 755011

## 2022-08-31 LAB — POCT INR: POC INR: 2.6

## 2022-08-31 NOTE — Patient Instructions (Signed)
Description   Continue taking Warfarin 1 tablet daily. Recheck INR in 6 weeks. Coumadin Clinic [phone]     

## 2022-09-13 ENCOUNTER — Other Ambulatory Visit: Payer: Medicare Other

## 2022-09-14 ENCOUNTER — Inpatient Hospital Stay: Payer: Medicare Other | Attending: Oncology

## 2022-09-14 ENCOUNTER — Other Ambulatory Visit: Payer: Self-pay

## 2022-09-14 ENCOUNTER — Ambulatory Visit (HOSPITAL_COMMUNITY)
Admission: RE | Admit: 2022-09-14 | Discharge: 2022-09-14 | Disposition: A | Discharge status: Home / Self Care | Payer: Medicare Other | Source: Ambulatory Visit | Attending: Oncology | Admitting: Oncology

## 2022-09-14 DIAGNOSIS — K8020 Calculus of gallbladder without cholecystitis without obstruction: Secondary | ICD-10-CM | POA: Insufficient documentation

## 2022-09-14 DIAGNOSIS — I7121 Aneurysm of the ascending aorta, without rupture: Secondary | ICD-10-CM | POA: Diagnosis not present

## 2022-09-14 DIAGNOSIS — C7931 Secondary malignant neoplasm of brain: Secondary | ICD-10-CM

## 2022-09-14 DIAGNOSIS — Z905 Acquired absence of kidney: Secondary | ICD-10-CM | POA: Insufficient documentation

## 2022-09-14 DIAGNOSIS — C641 Malignant neoplasm of right kidney, except renal pelvis: Secondary | ICD-10-CM | POA: Insufficient documentation

## 2022-09-14 LAB — CBC WITH DIFFERENTIAL/PLATELET
Abs Immature Granulocytes: 0.01 10*3/uL (ref 0.00–0.07)
Basophils Absolute: 0.1 10*3/uL (ref 0.0–0.1)
Basophils Relative: 1 %
Eosinophils Absolute: 0.2 10*3/uL (ref 0.0–0.5)
Eosinophils Relative: 3 %
HCT: 37 % (ref 36.0–46.0)
Hemoglobin: 12 g/dL (ref 12.0–15.0)
Immature Granulocytes: 0 %
Lymphocytes Relative: 38 %
Lymphs Abs: 2.3 10*3/uL (ref 0.7–4.0)
MCH: 24.2 pg — ABNORMAL LOW (ref 26.0–34.0)
MCHC: 32.4 g/dL (ref 30.0–36.0)
MCV: 74.7 fL — ABNORMAL LOW (ref 80.0–100.0)
Monocytes Absolute: 0.9 10*3/uL (ref 0.1–1.0)
Monocytes Relative: 15 %
Neutro Abs: 2.5 10*3/uL (ref 1.7–7.7)
Neutrophils Relative %: 43 %
Platelets: 253 10*3/uL (ref 150–400)
RBC: 4.95 MIL/uL (ref 3.87–5.11)
RDW: 14.9 % (ref 11.5–15.5)
WBC: 6 10*3/uL (ref 4.0–10.5)
nRBC: 0 % (ref 0.0–0.2)

## 2022-09-14 LAB — COMPREHENSIVE METABOLIC PANEL
ALT: 13 U/L (ref 0–44)
AST: 20 U/L (ref 15–41)
Albumin: 4.6 g/dL (ref 3.5–5.0)
Alkaline Phosphatase: 103 U/L (ref 38–126)
Anion gap: 7 (ref 5–15)
BUN: 50 mg/dL — ABNORMAL HIGH (ref 8–23)
CO2: 27 mmol/L (ref 22–32)
Calcium: 10.1 mg/dL (ref 8.9–10.3)
Chloride: 105 mmol/L (ref 98–111)
Creatinine, Ser: 2.83 mg/dL — ABNORMAL HIGH (ref 0.44–1.00)
GFR, Estimated: 17 mL/min — ABNORMAL LOW (ref >60)
Glucose, Bld: 77 mg/dL (ref 70–99)
Potassium: 4.6 mmol/L (ref 3.5–5.1)
Sodium: 139 mmol/L (ref 135–145)
Total Bilirubin: 0.6 mg/dL (ref 0.3–1.2)
Total Protein: 7.8 g/dL (ref 6.5–8.1)

## 2022-09-20 ENCOUNTER — Ambulatory Visit: Payer: Medicare Other | Admitting: Oncology

## 2022-09-20 ENCOUNTER — Ambulatory Visit: Payer: Medicare Other | Admitting: Internal Medicine

## 2022-09-21 ENCOUNTER — Inpatient Hospital Stay: Payer: Medicare Other | Attending: Oncology | Admitting: Internal Medicine

## 2022-09-21 VITALS — BP 109/69 | HR 70 | Temp 97.3°F | Resp 20 | Wt 201.6 lb

## 2022-09-21 DIAGNOSIS — C641 Malignant neoplasm of right kidney, except renal pelvis: Secondary | ICD-10-CM | POA: Diagnosis not present

## 2022-09-21 DIAGNOSIS — Z952 Presence of prosthetic heart valve: Secondary | ICD-10-CM | POA: Diagnosis not present

## 2022-09-21 DIAGNOSIS — Z905 Acquired absence of kidney: Secondary | ICD-10-CM | POA: Diagnosis not present

## 2022-09-21 DIAGNOSIS — C7931 Secondary malignant neoplasm of brain: Secondary | ICD-10-CM | POA: Insufficient documentation

## 2022-09-21 DIAGNOSIS — I509 Heart failure, unspecified: Secondary | ICD-10-CM | POA: Insufficient documentation

## 2022-09-21 DIAGNOSIS — I7121 Aneurysm of the ascending aorta, without rupture: Secondary | ICD-10-CM | POA: Insufficient documentation

## 2022-09-21 DIAGNOSIS — N189 Chronic kidney disease, unspecified: Secondary | ICD-10-CM | POA: Insufficient documentation

## 2022-09-21 DIAGNOSIS — I130 Hypertensive heart and chronic kidney disease with heart failure and stage 1 through stage 4 chronic kidney disease, or unspecified chronic kidney disease: Secondary | ICD-10-CM | POA: Insufficient documentation

## 2022-09-21 DIAGNOSIS — I700 Atherosclerosis of aorta: Secondary | ICD-10-CM | POA: Diagnosis not present

## 2022-09-21 DIAGNOSIS — Z87891 Personal history of nicotine dependence: Secondary | ICD-10-CM | POA: Diagnosis not present

## 2022-09-21 DIAGNOSIS — Z90710 Acquired absence of both cervix and uterus: Secondary | ICD-10-CM | POA: Diagnosis not present

## 2022-09-21 NOTE — Progress Notes (Signed)
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  PRIOR THERAPY: Status post right frontal craniotomy and resection of solitary brain metastasis in March 2021 followed by postoperative SRS at [facility]. Status post right radical nephrectomy on Oct 16, 2019 with the final pathologic stage of pT3a, pN0.  The final pathology showed indeterminant subtype with nuclear grade 3. Status post stereotactic radiotherapy on March 03, 2020 to metastatic brain metastasis when the patient developed 2.0 cm mass in the right frontal lobe consistent with recurrent disease. Status post [facility] on February 08, 2021 for another isolated left frontal lobe metastasis.  CURRENT THERAPY: Observation.  INTERVAL HISTORY: Jacqueline Palmer 77 y.o. female came to the clinic today to establish care with me after her primary oncologist Dr. Clelia Palmer left the practice.  The patient is feeling fine today with no concerning complaints except for fatigue.  She has no current chest pain, shortness of breath, cough or hemoptysis.  She has no nausea, vomiting, diarrhea or constipation.  She has no headache or visual changes.  She was diagnosed with papillary renal cell carcinoma in January 2021 when the patient had seizure activity after COVID injection and was found to have solitary brain metastasis in addition to the right kidney cancer.  She had several treatment with surgical resection of brain tumor as well as the right radical nephrectomy and SRS to recurrent disease in the brain several times.  She never received systemic therapy.  Her family history is unremarkable for any malignancy in her parents or sibling.  The patient is a widow and has 3  stepchildren.  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  She has a history of smoking for around 40 years and quit around 15 years ago.  She has no history of alcohol or drug abuse.  She is here today for evaluation with repeat CT scan of the chest, abdomen and pelvis for restaging of her disease.  MEDICAL HISTORY: Past Medical History:  Diagnosis Date   Acute kidney injury (HCC)    Arthritis    Atrial fibrillation (HCC)    Cancer (HCC)    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   Cardiomyopathy [facility])    CHF (congestive heart failure) (HCC)    Chronic a-fib (HCC) 08/14/2016   Chronic anticoagulation 08/14/2016   Coronary artery disease    4v CABG 6/17   Graves disease    Hypertension    Hypothyroidism    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   SN [id number] Model [id number] [From patient's surgical info card]   Presence of permanent cardiac pacemaker    Seizures (HCC)    Thrombus 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 myocardial infarction    Thyroid disease     ALLERGIES:  is allergic to avocado, banana, plantain, ace inhibitors, codeine, prednisone, tape, latex, and pharmabase cosmetic [aquamed].  MEDICATIONS:  Current Outpatient Medications  Medication Sig Dispense Refill   acetaminophen (TYLENOL) 500 MG tablet Take 1,000 mg by mouth in the morning and at bedtime.     colchicine 0.6 MG tablet TAKE ONE TABLET BY MOUTH DAILY AT BEDTIME 90 tablet 0   COVID-19 mRNA vaccine  [id number] (COMIRNATY) syringe Inject into the muscle. 0.3 mL 0   hydrALAZINE (APRESOLINE) 10 MG tablet Take 1 tablet (10 mg total) by mouth 3 (three) times daily. 270 tablet 2   levETIRAcetam (KEPPRA) 500 MG tablet Take 1 tablet (500 mg total) by mouth 2 (two) times daily. 180 tablet 3   levothyroxine (SYNTHROID) 25 MCG tablet TAKE ONE TABLET BY MOUTH ONE TIME DAILY IN THE MORNING 30-60 MINUTES BEFORE BREAKFAST ON EMPTY STOMACH WITH FULL GLASS OF WATER 90  tablet 1   metoprolol succinate (TOPROL-XL) 25 MG 24 hr tablet Take 1 tablet (25 mg total) by mouth daily. 90 tablet 1   nitroGLYCERIN (NITROSTAT) 0.4 MG SL tablet Place 1 tablet (0.4 mg total) under the tongue every 5 (five) minutes as needed for chest pain. 25 tablet 3   OVER THE COUNTER MEDICATION Apply 1 application topically 2 (two) times daily. Pain cream in groin area     polyethylene glycol (MIRALAX / GLYCOLAX) 17 g packet Take 17 g by mouth daily as needed for mild constipation.     pravastatin (PRAVACHOL) 40 MG tablet Take 1 tablet (40 mg total) by mouth daily. 90 tablet 2   spironolactone (ALDACTONE) 25 MG tablet TAKE ONE TABLET BY MOUTH ONE TIME DAILY 90 tablet 0   torsemide (DEMADEX) 20 MG tablet TAKE 2 TABLETS BY MOUTH EVERY MORNING AND 1 TABLET EVERY EVENING 270 tablet 1   warfarin (COUMADIN) 5 MG tablet TAKE ONE TABLET BY MOUTH ONE TIME DAILY IN THE EVENING OR AS DIRECTED BY COUMADIN CLINIC 100 tablet 0   No current facility-administered medications for this visit.    SURGICAL HISTORY:  Past Surgical History:  Procedure Laterality Date   ABDOMINAL HYSTERECTOMY     CORONARY ARTERY BYPASS GRAFT     CRANIOTOMY Right 07/30/2019   Right frontal   NEPHRECTOMY Right 10/16/2019   TOTAL HIP ARTHROPLASTY Right 07/11/2021   Procedure: RIGHT TOTAL HIP ARTHROPLASTY ANTERIOR APPROACH;  Surgeon: Gean Birchwood, MD;  Location: [facility];  Service: Orthopedics;  Laterality: Right;    REVIEW OF SYSTEMS:  Constitutional: positive for fatigue Eyes: negative Ears, nose, mouth, throat, and face: negative Respiratory: negative Cardiovascular: negative Gastrointestinal: negative Genitourinary:negative Integument/breast: negative Hematologic/lymphatic: negative Musculoskeletal:positive for arthralgias and muscle weakness Neurological: negative Behavioral/Psych: negative Endocrine: negative Allergic/Immunologic: negative   PHYSICAL EXAMINATION: General appearance: alert, cooperative,  fatigued, and no distress Head: Normocephalic, without obvious abnormality, atraumatic Neck: no adenopathy, no JVD, supple, symmetrical, trachea midline, and thyroid not enlarged, symmetric, no tenderness/mass/nodules Lymph nodes: Cervical, supraclavicular, and axillary nodes normal. Resp: clear to auscultation bilaterally Back: symmetric, no curvature. ROM normal. No CVA tenderness. Cardio: regular rate and rhythm, S1, S2 normal, no murmur, click, rub or gallop GI: soft, non-tender; bowel sounds normal; no masses,  no organomegaly Extremities: extremities normal, atraumatic, no cyanosis or edema Neurologic: Alert and oriented X 3, normal strength and tone. Normal symmetric reflexes. Normal coordination and gait  ECOG PERFORMANCE STATUS: 1 - Symptomatic but completely ambulatory  Blood pressure 109/69, pulse 70, temperature (!) 97.3 F (36.3 C), temperature source Temporal, resp. rate 20, weight 201 lb 9.6 oz (91.4 kg), SpO2 100 %.  LABORATORY DATA: Lab Results  Component Value Date   WBC 6.0 09/14/2022   HGB 12.0 09/14/2022   HCT 37.0 09/14/2022   MCV 74.7 (L) 09/14/2022   PLT 253 09/14/2022      Chemistry      Component Value Date/Time   NA 139 09/14/2022 0939   NA 141 12/14/2020  1053   K 4.6 09/14/2022 0939   CL 105 09/14/2022 0939   CO2 27 09/14/2022 0939   BUN 50 (H) 09/14/2022 0939   BUN 45 (H) 12/14/2020 1053   CREATININE 2.83 (H) 09/14/2022 0939   CREATININE 3.07 (HH) 03/02/2022 0909      Component Value Date/Time   CALCIUM 10.1 09/14/2022 0939   ALKPHOS 103 09/14/2022 0939   AST 20 09/14/2022 0939   AST 21 03/02/2022 0909   ALT 13 09/14/2022 0939   ALT 16 03/02/2022 0909   BILITOT 0.6 09/14/2022 0939   BILITOT 0.6 03/28/2022 1157   BILITOT 0.5 03/02/2022 0909       RADIOGRAPHIC STUDIES: CT Chest Wo Contrast  Result Date: 09/18/2022 CLINICAL DATA:  Follow-up renal cell carcinoma. Surveillance. * Tracking Code: BO * EXAM: CT CHEST, ABDOMEN AND PELVIS  WITHOUT CONTRAST TECHNIQUE: Multidetector CT imaging of the chest, abdomen and pelvis was performed following the standard protocol without IV contrast. RADIATION DOSE REDUCTION: This exam was performed according to the departmental dose-optimization program which includes automated exposure control, adjustment of the mA and/or kV according to patient size and/or use of iterative reconstruction technique. COMPARISON:  03/02/2022 FINDINGS: CT CHEST FINDINGS Cardiovascular: No acute findings. Pacemaker present and prior CABG again noted. A 4.3 cm ascending thoracic aortic aneurysm shows no significant change. Mediastinum/Lymph Nodes: No masses or pathologically enlarged lymph nodes identified on this unenhanced exam. Lungs/Pleura: No evidence of infiltrate, mass, or pleural effusion. Scattered tiny sub-centimeter pulmonary nodules both lungs remains stable, consistent with postinflammatory etiology. No new or enlarging pulmonary nodules identified. Musculoskeletal:  No suspicious bone lesions identified. CT ABDOMEN AND PELVIS FINDINGS Hepatobiliary: No masses visualized on this unenhanced exam. Tiny calcified gallstone again seen, without evidence of cholecystitis or biliary ductal dilatation. Pancreas: No mass or inflammatory changes identified on this unenhanced exam. Spleen:  Within normal limits in size. Adrenals/Urinary Tract: Postop changes again seen from prior right nephrectomy. Left kidney is unremarkable in appearance on this unenhanced exam. A soft tissue nodule is seen along the superior aspect of the surgical bed and surgical clips, which measures 2.3 x 2.0 cm on image 18/2. This is highly suspicious for recurrent carcinoma. Stomach/Bowel: Small hiatal hernia noted. No evidence of obstruction, inflammatory process, or abnormal fluid collections. Vascular/Lymphatic: No pathologically enlarged lymph nodes identified. No abdominal aortic aneurysm. Aortic atherosclerotic calcification incidentally noted.  Reproductive: Prior hysterectomy noted. Adnexal regions are unremarkable in appearance. Other:  None. Musculoskeletal: No suspicious bone lesions identified. Right hip prosthesis again noted. IMPRESSION: 2.3 cm soft tissue nodule along the superior aspect of the right nephrectomy bed, highly suspicious for recurrent carcinoma. No other sites of recurrent or metastatic carcinoma identified. Cholelithiasis. No radiographic evidence of cholecystitis. Stable 4.3 cm ascending thoracic aortic aneurysm. Recommend annual imaging followup by CTA or MRA. This recommendation follows 2010 ACCF/AHA/AATS/ACR/ASA/SCA/SCAI/SIR/STS/SVM Guidelines for the Diagnosis and Management of Patients with Thoracic Aortic Disease. Circulation. 2010; 121: Z610-R604. Aortic aneurysm NOS (ICD10-I71.9) Electronically Signed   By: Danae Orleans M.D.   On: 09/18/2022 12:14   CT ABDOMEN PELVIS WO CONTRAST  Result Date: 09/18/2022 CLINICAL DATA:  Follow-up renal cell carcinoma. Surveillance. * Tracking Code: BO * EXAM: CT CHEST, ABDOMEN AND PELVIS WITHOUT CONTRAST TECHNIQUE: Multidetector CT imaging of the chest, abdomen and pelvis was performed following the standard protocol without IV contrast. RADIATION DOSE REDUCTION: This exam was performed according to the departmental dose-optimization program which includes automated exposure control, adjustment of the mA and/or kV according to patient size  and/or use of iterative reconstruction technique. COMPARISON:  03/02/2022 FINDINGS: CT CHEST FINDINGS Cardiovascular: No acute findings. Pacemaker present and prior CABG again noted. A 4.3 cm ascending thoracic aortic aneurysm shows no significant change. Mediastinum/Lymph Nodes: No masses or pathologically enlarged lymph nodes identified on this unenhanced exam. Lungs/Pleura: No evidence of infiltrate, mass, or pleural effusion. Scattered tiny sub-centimeter pulmonary nodules both lungs remains stable, consistent with postinflammatory etiology. No new  or enlarging pulmonary nodules identified. Musculoskeletal:  No suspicious bone lesions identified. CT ABDOMEN AND PELVIS FINDINGS Hepatobiliary: No masses visualized on this unenhanced exam. Tiny calcified gallstone again seen, without evidence of cholecystitis or biliary ductal dilatation. Pancreas: No mass or inflammatory changes identified on this unenhanced exam. Spleen:  Within normal limits in size. Adrenals/Urinary Tract: Postop changes again seen from prior right nephrectomy. Left kidney is unremarkable in appearance on this unenhanced exam. A soft tissue nodule is seen along the superior aspect of the surgical bed and surgical clips, which measures 2.3 x 2.0 cm on image 18/2. This is highly suspicious for recurrent carcinoma. Stomach/Bowel: Small hiatal hernia noted. No evidence of obstruction, inflammatory process, or abnormal fluid collections. Vascular/Lymphatic: No pathologically enlarged lymph nodes identified. No abdominal aortic aneurysm. Aortic atherosclerotic calcification incidentally noted. Reproductive: Prior hysterectomy noted. Adnexal regions are unremarkable in appearance. Other:  None. Musculoskeletal: No suspicious bone lesions identified. Right hip prosthesis again noted. IMPRESSION: 2.3 cm soft tissue nodule along the superior aspect of the right nephrectomy bed, highly suspicious for recurrent carcinoma. No other sites of recurrent or metastatic carcinoma identified. Cholelithiasis. No radiographic evidence of cholecystitis. Stable 4.3 cm ascending thoracic aortic aneurysm. Recommend annual imaging followup by CTA or MRA. This recommendation follows 2010 ACCF/AHA/AATS/ACR/ASA/SCA/SCAI/SIR/STS/SVM Guidelines for the Diagnosis and Management of Patients with Thoracic Aortic Disease. Circulation. 2010; 121: W098-J191. Aortic aneurysm NOS (ICD10-I71.9) Electronically Signed   By: Danae Orleans M.D.   On: 09/18/2022 12:14   CUP PACEART REMOTE DEVICE CHECK  Result Date:  08/31/2022 Scheduled remote reviewed. Normal device function.  Permanent AF/AFL, controlled rates, Warfarin per EPIC Next remote 91 days. LA, CVRSMRI Protection last programmed on May 25, 2022. Beeper is OFF.  MM 3D SCREENING MAMMOGRAM BILATERAL BREAST  Result Date: 08/23/2022 CLINICAL DATA:  Screening. EXAM: DIGITAL SCREENING BILATERAL MAMMOGRAM WITH TOMOSYNTHESIS AND CAD TECHNIQUE: Bilateral screening digital craniocaudal and mediolateral oblique mammograms were obtained. Bilateral screening digital breast tomosynthesis was performed. The images were evaluated with computer-aided detection. COMPARISON:  Previous exam(s). ACR Breast Density Category b: There are scattered areas of fibroglandular density. FINDINGS: There are no findings suspicious for malignancy. IMPRESSION: No mammographic evidence of malignancy. A result letter of this screening mammogram will be mailed directly to the patient. RECOMMENDATION: Screening mammogram in one year. (Code:[id number]) BI-RADS CATEGORY  1: Negative. Electronically Signed   By: Emmaline Kluver M.D.   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 The patient has the following treatment: 1) status post right frontal craniotomy and resection of solitary brain metastasis in March 2021 followed by postoperative SRS at [facility]. 2) Status post right radical nephrectomy on Oct 16, 2019 with the final pathologic stage of pT3a, pN0.  The final pathology showed indeterminant subtype with nuclear grade 3. 3) Status post stereotactic radiotherapy on March 03, 2020 to metastatic brain metastasis when the  patient developed 2.0 cm mass in the right frontal lobe consistent with recurrent disease. 4) Status post SRS on February 08, 2021 for another isolated  left frontal lobe metastasis. She never received any systemic treatment and she is currently on observation. She had repeat CT scan of the chest, abdomen and pelvis performed recently.  I personally and independently reviewed the scan images and discussed the result and showed the images to the patient today. Unfortunately her scan showed 2.3 cm soft tissue nodule along the superior aspect of the right nephrectomy bed suspicious for recurrent disease. I recommended for the patient to have a PET scan for further evaluation of this lesion and we may consider biopsy of the soft tissue mass if needed based on the PET scan results. I will see the patient back for follow-up visit in 3 weeks for evaluation and discussion of her PET scan results and further recommendation regarding her condition. For the brain metastasis, she is followed by Dr. Barbaraann Cao as well as Dr. Kathrynn Running. She was advised to call immediately if she has any other concerning symptoms in the interval. The patient voices understanding of current disease status and treatment options and is in agreement with the current care plan.  All questions were answered. The patient knows to call the clinic with any problems, questions or concerns. We can certainly see the patient much sooner if necessary.  The total time spent in the appointment was 40 minutes.  Disclaimer: This note was dictated with voice recognition software. Similar sounding words can inadvertently be transcribed and may not be corrected upon review.

## 2022-09-22 ENCOUNTER — Telehealth: Payer: Self-pay | Admitting: Internal Medicine

## 2022-09-25 ENCOUNTER — Telehealth: Payer: Self-pay | Admitting: Internal Medicine

## 2022-09-25 NOTE — Telephone Encounter (Signed)
Rescheduled 05/23 appointment due to provider pal, patient has been called and notified.  

## 2022-10-05 ENCOUNTER — Ambulatory Visit (HOSPITAL_COMMUNITY)
Admission: RE | Admit: 2022-10-05 | Discharge: 2022-10-05 | Disposition: A | Discharge status: Home / Self Care | Payer: Medicare Other | Source: Ambulatory Visit | Attending: Internal Medicine | Admitting: Internal Medicine

## 2022-10-05 DIAGNOSIS — C7931 Secondary malignant neoplasm of brain: Secondary | ICD-10-CM | POA: Insufficient documentation

## 2022-10-05 MED ORDER — GADOBUTROL 1 MMOL/ML IV SOLN
10.0000 mL | Freq: Once | INTRAVENOUS | Status: AC | PRN
  Administered 2022-10-05: 10 mL via INTRAVENOUS

## 2022-10-05 NOTE — Progress Notes (Signed)
With [facility] device rep Joey and with cardiology PA aware, Changed device settings for MRI to VOO at 80 bpm MRI mode/Tachy-therapies to off Will program device back to pre-MRI settings after completion of exam, and send transmission.

## 2022-10-06 NOTE — Progress Notes (Signed)
Remote ICD transmission.   

## 2022-10-09 ENCOUNTER — Inpatient Hospital Stay: Payer: Medicare Other

## 2022-10-09 ENCOUNTER — Other Ambulatory Visit: Payer: Self-pay | Admitting: Internal Medicine

## 2022-10-10 ENCOUNTER — Inpatient Hospital Stay (HOSPITAL_BASED_OUTPATIENT_CLINIC_OR_DEPARTMENT_OTHER): Payer: Medicare Other | Admitting: Internal Medicine

## 2022-10-10 ENCOUNTER — Encounter (HOSPITAL_COMMUNITY): Payer: Medicare Other

## 2022-10-10 ENCOUNTER — Encounter (HOSPITAL_COMMUNITY): Payer: Self-pay

## 2022-10-10 ENCOUNTER — Ambulatory Visit (HOSPITAL_COMMUNITY)
Admission: RE | Admit: 2022-10-10 | Discharge: 2022-10-10 | Disposition: A | Discharge status: Home / Self Care | Payer: Medicare Other | Source: Ambulatory Visit | Attending: Internal Medicine | Admitting: Internal Medicine

## 2022-10-10 VITALS — BP 111/77 | HR 71 | Temp 97.9°F | Resp 18 | Ht 68.0 in | Wt 198.7 lb

## 2022-10-10 DIAGNOSIS — C641 Malignant neoplasm of right kidney, except renal pelvis: Secondary | ICD-10-CM | POA: Insufficient documentation

## 2022-10-10 DIAGNOSIS — C7931 Secondary malignant neoplasm of brain: Secondary | ICD-10-CM | POA: Diagnosis not present

## 2022-10-10 LAB — GLUCOSE, CAPILLARY: Glucose-Capillary: 78 mg/dL (ref 70–99)

## 2022-10-10 MED ORDER — FLUDEOXYGLUCOSE F - 18 (FDG) INJECTION
9.8500 | Freq: Once | INTRAVENOUS | Status: AC | PRN
  Administered 2022-10-10: 9.85 via INTRAVENOUS

## 2022-10-10 NOTE — Progress Notes (Unsigned)
[facility] OFFICE PROGRESS NOTE  Marcine Matar, MD [address] 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 Local recurrence seen in May 2024.   PRIOR THERAPY: Status post right frontal craniotomy and resection of solitary brain metastasis in March 2021 followed by postoperative SRS at [facility]. Status post right radical nephrectomy on Oct 16, 2019 with the final pathologic stage of pT3a, pN0.  The final pathology showed indeterminant subtype with nuclear grade 3. Status post stereotactic radiotherapy on March 03, 2020 to metastatic brain metastasis when the patient developed 2.0 cm mass in the right frontal lobe consistent with recurrent disease. Status post [facility] on February 08, 2021 for another isolated left frontal lobe metastasis.  CURRENT THERAPY: Referral to radiation oncology   INTERVAL HISTORY: Jacqueline Palmer 77 y.o. female returns to the clinic today for a follow-up visit.  The patient saw Dr. Arbutus Ped on 09/21/2022 to establish care after her primary oncologist, Dr. Clelia Croft, has left the practice.  The patient is feeling well that day without any concerning complaints.  She is currently on observation for history of renal cell carcinoma.  A restaging CT scan at that time which showed a 2.3 cm soft tissue nodule along the superior aspect of the right nephrectomy bed suspicious for recurrent disease.  Therefore Dr. Arbutus Ped recommended a PET scan to further evaluate this.  In the interval since last being seen, the patient denies any major changes in her health.  She saw Dr. Barbaraann Cao recently for history of metastatic disease to the brain.  She is currently on Keppra.   Today she denies any fever, chills, night sweats, or unexplained weight loss.  Denies any chest pain, shortness of breath, cough, or hemoptysis.   Denies any nausea, vomiting, diarrhea, or constipation. She gets back pain if she stands for long periods. Denies any hematuria.  Denies any abdominal pain.  Denies any headache or visual changes.  She is here today for evaluation to review her PET scan results.    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  MEDICATIONS:  Current Outpatient Medications  Medication Sig Dispense Refill   acetaminophen (TYLENOL) 500 MG tablet Take 1,000 mg by mouth daily with breakfast.     colchicine 0.6 MG tablet TAKE ONE TABLET BY MOUTH DAILY AT BEDTIME 90 tablet 3   COVID-19 mRNA vaccine 2023-2024 (COMIRNATY) syringe Inject into the muscle. 0.3 mL 0   hydrALAZINE (APRESOLINE) 10 MG tablet Take 1 tablet (  10 mg total) by mouth 3 (three) times daily. 270 tablet 2   levETIRAcetam (KEPPRA) 500 MG tablet Take 1 tablet (500 mg total) by mouth 2 (two) times daily. 180 tablet 3   levothyroxine (SYNTHROID) 25 MCG tablet TAKE ONE TABLET BY MOUTH ONE TIME DAILY IN THE MORNING 30-60 MINUTES BEFORE BREAKFAST ON EMPTY STOMACH WITH FULL GLASS OF WATER 90 tablet 1   metoprolol succinate (TOPROL-XL) 25 MG 24 hr tablet Take 1 tablet (25 mg  total) by mouth daily. 90 tablet 1   nitroGLYCERIN (NITROSTAT) 0.4 MG SL tablet Place 1 tablet (0.4 mg total) under the tongue every 5 (five) minutes as needed for chest pain. 25 tablet 3   OVER THE COUNTER MEDICATION Apply 1 application topically 2 (two) times daily. Pain cream in groin area     polyethylene glycol (MIRALAX / GLYCOLAX) 17 g packet Take 17 g by mouth daily as needed for mild constipation.     pravastatin (PRAVACHOL) 40 MG tablet Take 1 tablet (40 mg total) by mouth daily. 90 tablet 2   spironolactone (ALDACTONE) 25 MG tablet TAKE ONE TABLET BY MOUTH ONE TIME DAILY 90 tablet 0   torsemide (DEMADEX) 20 MG tablet TAKE 2 TABLETS BY MOUTH EVERY MORNING AND 1 TABLET EVERY EVENING 270 tablet 1   warfarin (COUMADIN) 5 MG tablet TAKE ONE TABLET BY MOUTH ONE TIME DAILY IN THE EVENING OR AS DIRECTED BY COUMADIN CLINIC 100 tablet 0   No current facility-administered medications for this visit.    SURGICAL HISTORY:  Past Surgical History:  Procedure Laterality Date   ABDOMINAL HYSTERECTOMY     CORONARY ARTERY BYPASS GRAFT     CRANIOTOMY Right 07/30/2019   Right frontal   NEPHRECTOMY Right 10/16/2019   TOTAL HIP ARTHROPLASTY Right 07/11/2021   Procedure: RIGHT TOTAL HIP ARTHROPLASTY ANTERIOR APPROACH;  Surgeon: Gean Birchwood, MD;  Location: [facility];  Service: Orthopedics;  Laterality: Right;    REVIEW OF SYSTEMS:   Review of Systems  Constitutional: Positive for fatigue. Negative for appetite change, chills, fever and unexpected weight change.  HENT: Negative for mouth sores, nosebleeds, sore throat and trouble swallowing.   Eyes: Negative for eye problems and icterus.  Respiratory: Negative for cough, hemoptysis, shortness of breath and wheezing.   Cardiovascular: Negative for chest pain and leg swelling.  Gastrointestinal: Negative for abdominal pain, constipation, diarrhea, nausea and vomiting.  Genitourinary: Negative for bladder incontinence, difficulty urinating, dysuria,  frequency and hematuria.   Musculoskeletal: Positive for back pain with standing for long periods. Negative for  gait problem, neck pain and neck stiffness.  Skin: Negative for itching and rash.  Neurological: Negative for dizziness, extremity weakness, gait problem, headaches, light-headedness and seizures.  Hematological: Negative for adenopathy. Does not bruise/bleed easily.  Psychiatric/Behavioral: Negative for confusion, depression and sleep disturbance. The patient is not nervous/anxious.     PHYSICAL EXAMINATION:  Blood pressure 115/74, pulse 71, temperature (!) 97.4 F (36.3 C), temperature source Oral, resp. rate 17, height 5\' 8"  (1.727 m), weight 204 lb 2 oz (92.6 kg), SpO2 100 %.  ECOG PERFORMANCE STATUS: 1  Physical Exam  Constitutional: Oriented to person, place, and time and well-developed, well-nourished, and in no distress.  HENT:  Head: Normocephalic and atraumatic.  Mouth/Throat: Oropharynx is clear and moist. No oropharyngeal exudate.  Eyes: Conjunctivae are normal. Right eye exhibits no discharge. Left eye exhibits no discharge. No scleral icterus.  Neck: Normal range of motion. Neck supple.  Cardiovascular: Normal rate, regular rhythm, normal heart  sounds and intact distal pulses.   Pulmonary/Chest: Effort normal and breath sounds normal. No respiratory distress. No wheezes. No rales.  Abdominal: Soft. Bowel sounds are normal. Exhibits no distension and no mass. There is no tenderness.  Musculoskeletal: Normal range of motion. Exhibits no edema.  Lymphadenopathy:    No cervical adenopathy.  Neurological: Alert and oriented to person, place, and time. Exhibits normal muscle tone. Gait normal. Coordination normal. Ambulates with a cane Skin: Skin is warm and dry. No rash noted. Not diaphoretic. No erythema. No pallor.  Psychiatric: Mood, memory and judgment normal.  Vitals reviewed.  LABORATORY DATA: Lab Results  Component Value Date   WBC 6.0 09/14/2022   HGB  12.0 09/14/2022   HCT 37.0 09/14/2022   MCV 74.7 (L) 09/14/2022   PLT 253 09/14/2022      Chemistry      Component Value Date/Time   NA 139 09/14/2022 0939   NA 141 12/14/2020 1053   K 4.6 09/14/2022 0939   CL 105 09/14/2022 0939   CO2 27 09/14/2022 0939   BUN 50 (H) 09/14/2022 0939   BUN 45 (H) 12/14/2020 1053   CREATININE 2.83 (H) 09/14/2022 0939   CREATININE 3.07 (HH) 03/02/2022 0909      Component Value Date/Time   CALCIUM 10.1 09/14/2022 0939   ALKPHOS 103 09/14/2022 0939   AST 20 09/14/2022 0939   AST 21 03/02/2022 0909   ALT 13 09/14/2022 0939   ALT 16 03/02/2022 0909   BILITOT 0.6 09/14/2022 0939   BILITOT 0.6 03/28/2022 1157   BILITOT 0.5 03/02/2022 0909       RADIOGRAPHIC STUDIES:  MR Brain W Wo Contrast  Result Date: 10/12/2022 CLINICAL DATA:  Brain/CNS neoplasm, assess treatment response. History of metastatic renal cell carcinoma. EXAM: MRI HEAD WITHOUT AND WITH CONTRAST TECHNIQUE: Multiplanar, multiecho pulse sequences of the brain and surrounding structures were obtained without and with intravenous contrast. CONTRAST:  10mL GADAVIST GADOBUTROL 1 MMOL/ML IV SOLN COMPARISON:  Head MRI 05/25/2022 FINDINGS: Brain: A resection cavity in the posterior right frontal lobe with associated chronic blood products and non masslike enhancement is unchanged. Surrounding T2 hyperintensity is also unchanged. A 4 mm focus of cortical enhancement more inferiorly in the lateral aspect of the right frontal lobe is unchanged (series 20, image 121). No new enhancing intracranial lesions are identified. Scattered chronic microhemorrhages elsewhere in both cerebral hemispheres are unchanged. Patchy to confluent T2 hyperintensities in the cerebral white matter bilaterally are unchanged and nonspecific but may reflect a combination of chronic small vessel ischemia and post treatment changes. Chronic lacunar infarcts are again noted in the deep gray nuclei and cerebellum. A 1.8 cm focus of  expansile, nonenhancing T2 hyperintensity involving cortex and juxtacortical white matter at the right temporoparietal junction is unchanged (series 13, image 44). Mild cerebral atrophy is within normal limits for age. No acute infarct, midline shift, or extra-axial fluid collection is evident. Vascular: Major intracranial vascular flow voids are preserved. Skull and upper cervical spine: Right frontal craniotomy. Unchanged 1 cm right frontal skull lesion extending through the inner table to the dura. Sinuses/Orbits: Chronic bilateral proptosis and depression of the right lamina papyracea. Paranasal sinuses and mastoid air cells are clear. Other: None. IMPRESSION: 1. Unchanged 4 mm focus of enhancement in the lateral right frontal lobe and unchanged appearance of the posterior right frontal resection cavity. 2. No evidence of new intracranial metastases. 3. Unchanged nonenhancing T2 hyperintensity at the right temporoparietal junction which may reflect a low-grade glioma  or cortical dysplasia. 4. Unchanged right frontal skull metastasis. Electronically Signed   By: Sebastian Ache M.D.   On: 10/12/2022 08:20   NM PET Image Restage (PS) Skull Base to Thigh (F-18 FDG)  Result Date: 10/12/2022 CLINICAL DATA:  Initial treatment strategy for right renal cell carcinoma. EXAM: NUCLEAR MEDICINE PET SKULL BASE TO THIGH TECHNIQUE: 9.85 mCi F-18 FDG was injected intravenously. Full-ring PET imaging was performed from the skull base to thigh after the radiotracer. CT data was obtained and used for attenuation correction and anatomic localization. Fasting blood glucose: 76 mg/dl COMPARISON:  CT chest, abdomen and pelvis 09/14/2022 FINDINGS: Mediastinal blood pool activity: SUV max 2.85. Liver activity: SUV max NA NECK: No hypermetabolic lymph nodes in the neck. Incidental CT findings: Benign, tracer avid brown fat identified within the soft tissues of the neck. CHEST: No hypermetabolic mediastinal or hilar nodes. No suspicious  pulmonary nodules on the CT scan. Incidental CT findings: Benign previous median sternotomy and CABG procedure. Left chest wall ICD noted with leads in the right atrial appendage, coronary sinus and right ventricle. Prosthetic mitral valve. Aortic atherosclerotic calcifications. Stable 4.3 cm ascending thoracic aortic aneurysm. Metabolically active brown fat is identified within the chest with a predominantly cape like distribution. There is also increased uptake localizing to the fat in bilateral axillary and paraspinal regions. ABDOMEN/PELVIS: Status post right nephrectomy. Corresponding to the recent CT findings there is a soft tissue nodule along the superior margin of the right nephrectomy bed. This measures 2.5 x 1.5 cm with SUV max 24.90, image 97/4. No abnormal tracer activity within the liver, pancreas, spleen, or adrenal glands. No tracer avid abdominopelvic lymph nodes. Incidental CT findings: Aortic atherosclerotic calcifications. SKELETON: No focal hypermetabolic activity to suggest skeletal metastasis. Incidental CT findings: None. IMPRESSION: 1. Status post right nephrectomy. There is a soft tissue nodule along the superior margin of the nephrectomy bed which exhibits intense FDG uptake compatible with local tumor recurrence. 2. No signs of tracer avid distant metastatic disease. 3. Stable 4.3 cm ascending thoracic aortic aneurysm. Recommend annual imaging followup by CTA or MRA. This recommendation follows 2010 ACCF/AHA/AATS/ACR/ASA/SCA/SCAI/SIR/STS/SVM Guidelines for the Diagnosis and Management of Patients with Thoracic Aortic Disease. Circulation. 2010; 121: W098-J191. Aortic aneurysm NOS (ICD10-I71.9) 4.  Aortic Atherosclerosis (ICD10-I70.0). Electronically Signed   By: Signa Kell M.D.   On: 10/12/2022 08:19   CT Chest Wo Contrast  Result Date: 09/18/2022 CLINICAL DATA:  Follow-up renal cell carcinoma. Surveillance. * Tracking Code: BO * EXAM: CT CHEST, ABDOMEN AND PELVIS WITHOUT CONTRAST  TECHNIQUE: Multidetector CT imaging of the chest, abdomen and pelvis was performed following the standard protocol without IV contrast. RADIATION DOSE REDUCTION: This exam was performed according to the departmental dose-optimization program which includes automated exposure control, adjustment of the mA and/or kV according to patient size and/or use of iterative reconstruction technique. COMPARISON:  03/02/2022 FINDINGS: CT CHEST FINDINGS Cardiovascular: No acute findings. Pacemaker present and prior CABG again noted. A 4.3 cm ascending thoracic aortic aneurysm shows no significant change. Mediastinum/Lymph Nodes: No masses or pathologically enlarged lymph nodes identified on this unenhanced exam. Lungs/Pleura: No evidence of infiltrate, mass, or pleural effusion. Scattered tiny sub-centimeter pulmonary nodules both lungs remains stable, consistent with postinflammatory etiology. No new or enlarging pulmonary nodules identified. Musculoskeletal:  No suspicious bone lesions identified. CT ABDOMEN AND PELVIS FINDINGS Hepatobiliary: No masses visualized on this unenhanced exam. Tiny calcified gallstone again seen, without evidence of cholecystitis or biliary ductal dilatation. Pancreas: No mass or inflammatory changes identified  on this unenhanced exam. Spleen:  Within normal limits in size. Adrenals/Urinary Tract: Postop changes again seen from prior right nephrectomy. Left kidney is unremarkable in appearance on this unenhanced exam. A soft tissue nodule is seen along the superior aspect of the surgical bed and surgical clips, which measures 2.3 x 2.0 cm on image 18/2. This is highly suspicious for recurrent carcinoma. Stomach/Bowel: Small hiatal hernia noted. No evidence of obstruction, inflammatory process, or abnormal fluid collections. Vascular/Lymphatic: No pathologically enlarged lymph nodes identified. No abdominal aortic aneurysm. Aortic atherosclerotic calcification incidentally noted. Reproductive: Prior  hysterectomy noted. Adnexal regions are unremarkable in appearance. Other:  None. Musculoskeletal: No suspicious bone lesions identified. Right hip prosthesis again noted. IMPRESSION: 2.3 cm soft tissue nodule along the superior aspect of the right nephrectomy bed, highly suspicious for recurrent carcinoma. No other sites of recurrent or metastatic carcinoma identified. Cholelithiasis. No radiographic evidence of cholecystitis. Stable 4.3 cm ascending thoracic aortic aneurysm. Recommend annual imaging followup by CTA or MRA. This recommendation follows 2010 ACCF/AHA/AATS/ACR/ASA/SCA/SCAI/SIR/STS/SVM Guidelines for the Diagnosis and Management of Patients with Thoracic Aortic Disease. Circulation. 2010; 121: Q469-G295. Aortic aneurysm NOS (ICD10-I71.9) Electronically Signed   By: Danae Orleans M.D.   On: 09/18/2022 12:14   CT ABDOMEN PELVIS WO CONTRAST  Result Date: 09/18/2022 CLINICAL DATA:  Follow-up renal cell carcinoma. Surveillance. * Tracking Code: BO * EXAM: CT CHEST, ABDOMEN AND PELVIS WITHOUT CONTRAST TECHNIQUE: Multidetector CT imaging of the chest, abdomen and pelvis was performed following the standard protocol without IV contrast. RADIATION DOSE REDUCTION: This exam was performed according to the departmental dose-optimization program which includes automated exposure control, adjustment of the mA and/or kV according to patient size and/or use of iterative reconstruction technique. COMPARISON:  03/02/2022 FINDINGS: CT CHEST FINDINGS Cardiovascular: No acute findings. Pacemaker present and prior CABG again noted. A 4.3 cm ascending thoracic aortic aneurysm shows no significant change. Mediastinum/Lymph Nodes: No masses or pathologically enlarged lymph nodes identified on this unenhanced exam. Lungs/Pleura: No evidence of infiltrate, mass, or pleural effusion. Scattered tiny sub-centimeter pulmonary nodules both lungs remains stable, consistent with postinflammatory etiology. No new or enlarging  pulmonary nodules identified. Musculoskeletal:  No suspicious bone lesions identified. CT ABDOMEN AND PELVIS FINDINGS Hepatobiliary: No masses visualized on this unenhanced exam. Tiny calcified gallstone again seen, without evidence of cholecystitis or biliary ductal dilatation. Pancreas: No mass or inflammatory changes identified on this unenhanced exam. Spleen:  Within normal limits in size. Adrenals/Urinary Tract: Postop changes again seen from prior right nephrectomy. Left kidney is unremarkable in appearance on this unenhanced exam. A soft tissue nodule is seen along the superior aspect of the surgical bed and surgical clips, which measures 2.3 x 2.0 cm on image 18/2. This is highly suspicious for recurrent carcinoma. Stomach/Bowel: Small hiatal hernia noted. No evidence of obstruction, inflammatory process, or abnormal fluid collections. Vascular/Lymphatic: No pathologically enlarged lymph nodes identified. No abdominal aortic aneurysm. Aortic atherosclerotic calcification incidentally noted. Reproductive: Prior hysterectomy noted. Adnexal regions are unremarkable in appearance. Other:  None. Musculoskeletal: No suspicious bone lesions identified. Right hip prosthesis again noted. IMPRESSION: 2.3 cm soft tissue nodule along the superior aspect of the right nephrectomy bed, highly suspicious for recurrent carcinoma. No other sites of recurrent or metastatic carcinoma identified. Cholelithiasis. No radiographic evidence of cholecystitis. Stable 4.3 cm ascending thoracic aortic aneurysm. Recommend annual imaging followup by CTA or MRA. This recommendation follows 2010 ACCF/AHA/AATS/ACR/ASA/SCA/SCAI/SIR/STS/SVM Guidelines for the Diagnosis and Management of Patients with Thoracic Aortic Disease. Circulation. 2010; 121: M841-L244. Aortic aneurysm NOS (  ICD10-I71.9) Electronically Signed   By: Danae Orleans M.D.   On: 09/18/2022 12:14     ASSESSMENT/PLAN:  This is a very pleasant 77 year old African-American  female with history of stage IV papillary renal cell carcinoma.  She was diagnosed in January 2021.  She presented with a right kidney tumor and isolated CNS metastasis. he patient has the following treatment: 1) status post right frontal craniotomy and resection of solitary brain metastasis in March 2021 followed by postoperative SRS at [facility]. 2) Status post right radical nephrectomy on Oct 16, 2019 with the final pathologic stage of pT3a, pN0.  The final pathology showed indeterminant subtype with nuclear grade 3. 3) Status post stereotactic radiotherapy on March 03, 2020 to metastatic brain metastasis when the patient developed 2.0 cm mass in the right frontal lobe consistent with recurrent disease. 4) Status post SRS on February 08, 2021 for another isolated left frontal lobe metastasis.  The patient had never received any systemic treatment and she is currently on observation.  She had a restaging CT scan and follow-up with Dr. Arbutus Ped in early May 2024 which showed a new 2.3 cm soft tissue nodule along the superior aspect of the right nephrectomy bed suspicious for recurrent disease.   Therefore, the patient had a PET scan performed.  The patient was seen with Dr. Arbutus Ped today.  Dr. Arbutus Ped personally and independently reviewed the PET scan and discussed results with the patient today.  The scan showed the soft tissue nodule along the margin exhibits intense FDG uptake which is compatible with local tumor recurrence.  The patient was seen with Dr. Arbutus Ped today.  Dr. Arbutus Ped discussed several options with the patient.  Dr. Arbutus Ped discussed possible biopsy vs. starting systemic immunotherapy with ipilimumab and nivolumab.  Dr. Arbutus Ped discussed the side effects of immunotherapy.  The patient is likely not a surgical candidate due to her comorbidities and CKD.  The patient is not interested in any systemic therapy at this time.  She is wondering what her other options  are.  Dr. Arbutus Ped discussed that we can consider referral to radiation oncology to see if she can receive local treatment to this area.  Dr. Arbutus Ped discussed that in general, sometimes renal cell carcinoma does not respond as well to radiation but we can certainly refer her downstairs for consultation.  The patient would be interested in consultation with radiation oncology to see if this area would be amenable to radiotherapy.  If she is able to proceed with radiation, then we will see her back for follow-up visit in 3 months with repeat CT scan of the chest, abdomen, pelvis.  Of course, due to her CKD we will order this without contrast.  If she is not a candidate for radiation, then we would like to see the patient back for follow-up sooner to rediscuss systemic treatment with immunotherapy.  She will continue to follow with neuro-oncology for her history of metastatic disease to the brain.   The patient was advised to call immediately if she has any concerning symptoms in the interval. The patient voices understanding of current disease status and treatment options and is in agreement with the current care plan. All questions were answered. The patient knows to call the clinic with any problems, questions or concerns. We can certainly see the patient much sooner if necessary    Orders Placed This Encounter  Procedures   CT CHEST ABDOMEN PELVIS WO CONTRAST    Standing Status:   Future  Standing Expiration Date:   10/12/2023    Order Specific Question:   Preferred imaging location?    Answer:   [facility]    Order Specific Question:   If indicated for the ordered procedure, I authorize the administration of oral contrast media per Radiology protocol    Answer:   Yes    Order Specific Question:   Does the patient have a contrast media/X-ray dye allergy?    Answer:   No   CBC with Differential (Cancer Center Only)    Standing Status:   Future    Standing Expiration Date:    10/12/2023   CMP (Cancer Center only)    Standing Status:   Future    Standing Expiration Date:   10/12/2023   Ambulatory referral to Radiation Oncology    Referral Priority:   Routine    Referral Type:   Consultation    Referral Reason:   Specialty Services Required    Requested Specialty:   Radiation Oncology    Number of Visits Requested:   1      Billy Turvey L Jayonna Meyering, PA-C 10/12/22  ADDENDUM: Hematology/Oncology Attending: I had a face-to-face encounter with the patient today.  I reviewed her record, lab, scan and recommended her care plan.  This is a very pleasant 77 years old African-American female with a stage IV papillary renal cell carcinoma diagnosed in January 2021 with right kidney tumor as well as isolated CNS metastasis.  She had local recurrence in May 2024.  The patient is status post several treatment in the past including right frontal craniotomy with resection of the solitary brain lesion followed by postoperative SRS.  She also had right radical nephrectomy on Oct 16, 2019.  She had another treatment with SRS to brain metastasis few times and last 1 was in September 2022. She has been on observation by Dr. Clelia Croft.  The patient was noted on recent CT scan of the chest to have questionable soft tissue density in the superior aspect of the right nephrectomy bed suspicious for recurrent tumor.  She had a PET scan performed on Oct 10, 2022 and that showed a soft tissue nodule along the superior margin of the nephrectomy bed with intense FDG uptake compatible with local tumor recurrence but no signs of tracer avid distant metastatic disease.  I had a lengthy discussion with the patient today about her current condition and treatment options.  I discussed with the patient several options for management of her condition including palliative radiotherapy versus consideration of systemic immunotherapy with ipilimumab and nivolumab.  The patient is not interested in systemic therapy  at this point and she would like to see radiation oncology for discussion of the palliative radiotherapy. Her recent MRI of the brain showed no evidence of disease progression in the brain. I will refer the patient back to Dr. Mitzi Hansen for discussion of the radiotherapy option to the new soft tissue hypermetabolic lesion. We will see the patient back for follow-up visit in 3 months with repeat CT scan of the chest, abdomen and pelvis for restaging of her disease. She was advised to call immediately if she has any other concerning symptoms in the interval. The total time spent in the appointment was 30 minutes. Disclaimer: This note was dictated with voice recognition software. Similar sounding words can inadvertently be transcribed and may be missed upon review. Lajuana Matte, MD

## 2022-10-10 NOTE — Progress Notes (Signed)
[facility] [address]  [place], [place] [address] [phone]   Interval Evaluation  Date of Service: 10/10/22 Patient Name: Jacqueline Palmer Patient MRN: [id number] Patient DOB: 1945-05-30 Provider: Henreitta Leber, MD  Identifying Statement:  Jacqueline Palmer is a 77 y.o. female with Malignant neoplasm metastatic to brain [facility]) [C79.31]   Primary Cancer: Renal Cell Carcinoma, Stage IV  Oncologic History: 07/30/19: Right frontal craniotomy, resection of solitary brain metastasis at [facility] 10/06/19: Post-operative SRS at [facility] (Dr. Adela Lank) 03/09/20: Salvage SRS to R frontal+LMD 27/3x 02/08/21: Salvage SRS to L frontal Jacqueline Palmer)  Interval History:  Jacqueline Palmer presents today for follow up after recent MRI brain.  Denies new or progressive changes. No recent seizures, continues to take Keppra 500mg  twice per day.  Continues to be active mentally and engaging socially.  H+P (02/24/20) Patient presents to review recent changes on brain MRI.  She initially presented in March 2021 with a seizure, described as "left side clenching up, lost awareness for a few minutes".  CNS imaging demonstrated an enhancing right frontal mass, c/w likely metastasis from renal cell carcinoma.  Craniotomy was performed at [facility] and followed with surgical bed radiosurgery at [facility].  She did have one recurrence of seizure activity in late August, at which time she was restarted on Keppra.  Unclear on why this was discontinued.  Recent MRI demonstrated some change and she presents today for treatment recommendations.  No neurologic complaints aside from seizures.  Medications: Current Outpatient Medications on File Prior to Visit  Medication Sig Dispense Refill   acetaminophen (TYLENOL) 500 MG tablet Take 1,000 mg by mouth daily with breakfast.     colchicine 0.6 MG tablet TAKE ONE TABLET BY MOUTH DAILY AT BEDTIME 90 tablet 0   COVID-19 mRNA vaccine 2023-2024 (COMIRNATY)  syringe Inject into the muscle. 0.3 mL 0   hydrALAZINE (APRESOLINE) 10 MG tablet Take 1 tablet (10 mg total) by mouth 3 (three) times daily. 270 tablet 2   levETIRAcetam (KEPPRA) 500 MG tablet Take 1 tablet (500 mg total) by mouth 2 (two) times daily. 180 tablet 3   levothyroxine (SYNTHROID) 25 MCG tablet TAKE ONE TABLET BY MOUTH ONE TIME DAILY IN THE MORNING 30-60 MINUTES BEFORE BREAKFAST ON EMPTY STOMACH WITH FULL GLASS OF WATER 90 tablet 1   metoprolol succinate (TOPROL-XL) 25 MG 24 hr tablet Take 1 tablet (25 mg total) by mouth daily. 90 tablet 1   OVER THE COUNTER MEDICATION Apply 1 application topically 2 (two) times daily. Pain cream in groin area     polyethylene glycol (MIRALAX / GLYCOLAX) 17 g packet Take 17 g by mouth daily as needed for mild constipation.     pravastatin (PRAVACHOL) 40 MG tablet Take 1 tablet (40 mg total) by mouth daily. 90 tablet 2   spironolactone (ALDACTONE) 25 MG tablet TAKE ONE TABLET BY MOUTH ONE TIME DAILY 90 tablet 0   torsemide (DEMADEX) 20 MG tablet TAKE 2 TABLETS BY MOUTH EVERY MORNING AND 1 TABLET EVERY EVENING 270 tablet 1   warfarin (COUMADIN) 5 MG tablet TAKE ONE TABLET BY MOUTH ONE TIME DAILY IN THE EVENING OR AS DIRECTED BY COUMADIN CLINIC 100 tablet 0   nitroGLYCERIN (NITROSTAT) 0.4 MG SL tablet Place 1 tablet (0.4 mg total) under the tongue every 5 (five) minutes as needed for chest pain. (Patient not taking: Reported on 10/10/2022) 25 tablet 3   No current facility-administered medications on file prior to visit.  Allergies:  Allergies  Allergen Reactions   Avocado Shortness Of Breath    Before 2007   Banana Anaphylaxis and Swelling   Plantain Anaphylaxis and Swelling   Ace Inhibitors Cough   Codeine Other (See Comments)    Nausea and Feels Jittery   Prednisone Swelling    Of face and lower extremities   Tape Hives    Tape  Does better with paper tape    Latex Rash   Pharmabase Cosmetic [Aquamed] Swelling and Rash    Some Soap,  perfumes,detergent   Past Medical History:  Past Medical History:  Diagnosis Date   Acute kidney injury (HCC)    Arthritis    Atrial fibrillation (HCC)    Cancer (HCC)    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   Cardiomyopathy (HCC)    CHF (congestive heart failure) (HCC)    Chronic a-fib (HCC) 08/14/2016   Chronic anticoagulation 08/14/2016   Coronary artery disease    4v CABG 6/17   Graves disease    Hypertension    Hypothyroidism    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   SN [id number] Model [id number] [From patient's surgical info card]   Presence of permanent cardiac pacemaker    Seizures (HCC)    Thrombus 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 myocardial infarction    Thyroid disease    Past Surgical History:  Past Surgical History:  Procedure Laterality Date   ABDOMINAL HYSTERECTOMY     CORONARY ARTERY BYPASS GRAFT     CRANIOTOMY Right 07/30/2019   Right frontal   NEPHRECTOMY Right 10/16/2019   TOTAL HIP ARTHROPLASTY Right 07/11/2021   Procedure: RIGHT TOTAL HIP ARTHROPLASTY ANTERIOR APPROACH;  Surgeon: Gean Birchwood, MD;  Location: [facility];  Service: Orthopedics;  Laterality: Right;   Social History:  Social History   Socioeconomic History   Marital status: Widowed    Spouse name: Not on file   Number of children: 0   Years of education: Doctorate   Highest education level: Master's degree (e.g., MA, MS, MEng, MEd, MSW, MBA)  Occupational History   Occupation: Retired Orthoptist  Tobacco Use   Smoking status: Former    Types: Cigarettes    Quit date: 07/30/2014    Years since quitting: 8.2   Smokeless tobacco: Never  Vaping Use   Vaping Use: Never used  Substance and Sexual Activity   Alcohol use: Yes    Comment: 1 glass wine twice weekly   Drug use: No   Sexual activity: Not on file  Other Topics Concern   Not on file  Social History Narrative   Right handed   Coffee daily/tea qod   Social Determinants of Health   Financial  Resource Strain: Low Risk  (04/21/2022)   Overall Financial Resource Strain (CARDIA)    Difficulty of Paying Living Expenses: Not hard at all  Food Insecurity: Unknown (04/21/2022)   Hunger Vital Sign    Worried About Running Out of Food in the Last Year: Never true    Ran Out of Food in the Last Year: Not on file  Transportation Needs: No Transportation Needs (04/21/2022)   PRAPARE - Administrator, Civil Service (Medical): No    Lack of Transportation (Non-Medical): No  Physical Activity: Insufficiently Active (04/21/2022)   Exercise Vital Sign    Days of Exercise per Week: 3 days    Minutes of Exercise per Session: 30 min  Stress: No Stress Concern Present (04/21/2022)  [employer] of Occupational Health - Occupational Stress Questionnaire    Feeling of Stress : Not at all  Social Connections: Moderately Integrated (04/21/2022)   Social Connection and Isolation Panel [NHANES]    Frequency of Communication with Friends and Family: More than three times a week    Frequency of Social Gatherings with Friends and Family: More than three times a week    Attends Religious Services: 1 to 4 times per year    Active Member of [employer] or Organizations: Yes    Attends Banker Meetings: More than 4 times per year    Marital Status: Widowed  Intimate Partner Violence: Not At Risk (04/21/2022)   Humiliation, Afraid, Rape, and Kick questionnaire    Fear of Current or Ex-Partner: No    Emotionally Abused: No    Physically Abused: No    Sexually Abused: No   Family History:  Family History  Problem Relation Age of Onset   Heart attack Mother    Heart attack Father    Heart attack Sister    Breast cancer Neg Hx     Review of Systems: Constitutional: Doesn't report fevers, chills or abnormal weight loss Eyes: Doesn't report blurriness of vision Ears, nose, mouth, throat, and face: Doesn't report sore throat Respiratory: Doesn't report cough, dyspnea or  wheezes Cardiovascular: Doesn't report palpitation, chest discomfort  Gastrointestinal:  Doesn't report nausea, constipation, diarrhea GU: Doesn't report incontinence Skin: Doesn't report skin rashes Neurological: Per HPI Musculoskeletal: Doesn't report joint pain Behavioral/Psych: Doesn't report anxiety  Physical Exam:    10/10/2022   11:51 AM 09/21/2022    9:41 AM 08/14/2022    1:59 PM  Vitals with BMI  Height 5\' 8"     Weight 198 lbs 11 oz 201 lbs 10 oz 196 lbs  BMI 30.22    Systolic 111 109 161  Diastolic 77 69 77  Pulse 71 70 70     KPS: 80. General: Alert, cooperative, pleasant, in no acute distress Head: Normal EENT: No conjunctival injection or scleral icterus.  Lungs: Resp effort normal Cardiac: Regular rate Abdomen: Non-distended abdomen Skin: No rashes cyanosis or petechiae. Extremities: No clubbing or edema  Neurologic Exam: Mental Status: Awake, alert, attentive to examiner. Oriented to self and environment. Language is fluent with intact comprehension.  Cranial Nerves: Visual acuity is grossly normal. Visual fields are full. Extra-ocular movements intact. No ptosis. Face is symmetric Motor: Tone and bulk are normal. Power is full in both arms and legs. Reflexes are symmetric, no pathologic reflexes present.  Sensory: Intact to light touch Gait: Orthopedic limitations  Labs: I have reviewed the data as listed    Component Value Date/Time   NA 139 09/14/2022 0939   NA 141 12/14/2020 1053   K 4.6 09/14/2022 0939   CL 105 09/14/2022 0939   CO2 27 09/14/2022 0939   GLUCOSE 77 09/14/2022 0939   BUN 50 (H) 09/14/2022 0939   BUN 45 (H) 12/14/2020 1053   CREATININE 2.83 (H) 09/14/2022 0939   CREATININE 3.07 (HH) 03/02/2022 0909   CALCIUM 10.1 09/14/2022 0939   PROT 7.8 09/14/2022 0939   PROT 7.7 03/28/2022 1157   ALBUMIN 4.6 09/14/2022 0939   ALBUMIN 4.8 03/28/2022 1157   AST 20 09/14/2022 0939   AST 21 03/02/2022 0909   ALT 13 09/14/2022 0939   ALT 16  03/02/2022 0909   ALKPHOS 103 09/14/2022 0939   BILITOT 0.6 09/14/2022 0939   BILITOT 0.6 03/28/2022 1157   BILITOT 0.5  03/02/2022 0909   GFRNONAA 17 (L) 09/14/2022 0939   GFRNONAA 15 (L) 03/02/2022 0909   GFRAA 30 (L) 01/20/2020 1433   Lab Results  Component Value Date   WBC 6.0 09/14/2022   NEUTROABS 2.5 09/14/2022   HGB 12.0 09/14/2022   HCT 37.0 09/14/2022   MCV 74.7 (L) 09/14/2022   PLT 253 09/14/2022    Imaging:  [facility] Clinician Interpretation: I have personally reviewed the CNS images as listed.  My interpretation, in the context of the patient's clinical presentation, is stable disease  CT Chest Wo Contrast  Result Date: 09/18/2022 CLINICAL DATA:  Follow-up renal cell carcinoma. Surveillance. * Tracking Code: BO * EXAM: CT CHEST, ABDOMEN AND PELVIS WITHOUT CONTRAST TECHNIQUE: Multidetector CT imaging of the chest, abdomen and pelvis was performed following the standard protocol without IV contrast. RADIATION DOSE REDUCTION: This exam was performed according to the departmental dose-optimization program which includes automated exposure control, adjustment of the mA and/or kV according to patient size and/or use of iterative reconstruction technique. COMPARISON:  03/02/2022 FINDINGS: CT CHEST FINDINGS Cardiovascular: No acute findings. Pacemaker present and prior CABG again noted. A 4.3 cm ascending thoracic aortic aneurysm shows no significant change. Mediastinum/Lymph Nodes: No masses or pathologically enlarged lymph nodes identified on this unenhanced exam. Lungs/Pleura: No evidence of infiltrate, mass, or pleural effusion. Scattered tiny sub-centimeter pulmonary nodules both lungs remains stable, consistent with postinflammatory etiology. No new or enlarging pulmonary nodules identified. Musculoskeletal:  No suspicious bone lesions identified. CT ABDOMEN AND PELVIS FINDINGS Hepatobiliary: No masses visualized on this unenhanced exam. Tiny calcified gallstone again seen, without  evidence of cholecystitis or biliary ductal dilatation. Pancreas: No mass or inflammatory changes identified on this unenhanced exam. Spleen:  Within normal limits in size. Adrenals/Urinary Tract: Postop changes again seen from prior right nephrectomy. Left kidney is unremarkable in appearance on this unenhanced exam. A soft tissue nodule is seen along the superior aspect of the surgical bed and surgical clips, which measures 2.3 x 2.0 cm on image 18/2. This is highly suspicious for recurrent carcinoma. Stomach/Bowel: Small hiatal hernia noted. No evidence of obstruction, inflammatory process, or abnormal fluid collections. Vascular/Lymphatic: No pathologically enlarged lymph nodes identified. No abdominal aortic aneurysm. Aortic atherosclerotic calcification incidentally noted. Reproductive: Prior hysterectomy noted. Adnexal regions are unremarkable in appearance. Other:  None. Musculoskeletal: No suspicious bone lesions identified. Right hip prosthesis again noted. IMPRESSION: 2.3 cm soft tissue nodule along the superior aspect of the right nephrectomy bed, highly suspicious for recurrent carcinoma. No other sites of recurrent or metastatic carcinoma identified. Cholelithiasis. No radiographic evidence of cholecystitis. Stable 4.3 cm ascending thoracic aortic aneurysm. Recommend annual imaging followup by CTA or MRA. This recommendation follows 2010 ACCF/AHA/AATS/ACR/ASA/SCA/SCAI/SIR/STS/SVM Guidelines for the Diagnosis and Management of Patients with Thoracic Aortic Disease. Circulation. 2010; 121: J191-Y782. Aortic aneurysm NOS (ICD10-I71.9) Electronically Signed   By: Danae Orleans M.D.   On: 09/18/2022 12:14   CT ABDOMEN PELVIS WO CONTRAST  Result Date: 09/18/2022 CLINICAL DATA:  Follow-up renal cell carcinoma. Surveillance. * Tracking Code: BO * EXAM: CT CHEST, ABDOMEN AND PELVIS WITHOUT CONTRAST TECHNIQUE: Multidetector CT imaging of the chest, abdomen and pelvis was performed following the standard  protocol without IV contrast. RADIATION DOSE REDUCTION: This exam was performed according to the departmental dose-optimization program which includes automated exposure control, adjustment of the mA and/or kV according to patient size and/or use of iterative reconstruction technique. COMPARISON:  03/02/2022 FINDINGS: CT CHEST FINDINGS Cardiovascular: No acute findings. Pacemaker present and prior CABG again  noted. A 4.3 cm ascending thoracic aortic aneurysm shows no significant change. Mediastinum/Lymph Nodes: No masses or pathologically enlarged lymph nodes identified on this unenhanced exam. Lungs/Pleura: No evidence of infiltrate, mass, or pleural effusion. Scattered tiny sub-centimeter pulmonary nodules both lungs remains stable, consistent with postinflammatory etiology. No new or enlarging pulmonary nodules identified. Musculoskeletal:  No suspicious bone lesions identified. CT ABDOMEN AND PELVIS FINDINGS Hepatobiliary: No masses visualized on this unenhanced exam. Tiny calcified gallstone again seen, without evidence of cholecystitis or biliary ductal dilatation. Pancreas: No mass or inflammatory changes identified on this unenhanced exam. Spleen:  Within normal limits in size. Adrenals/Urinary Tract: Postop changes again seen from prior right nephrectomy. Left kidney is unremarkable in appearance on this unenhanced exam. A soft tissue nodule is seen along the superior aspect of the surgical bed and surgical clips, which measures 2.3 x 2.0 cm on image 18/2. This is highly suspicious for recurrent carcinoma. Stomach/Bowel: Small hiatal hernia noted. No evidence of obstruction, inflammatory process, or abnormal fluid collections. Vascular/Lymphatic: No pathologically enlarged lymph nodes identified. No abdominal aortic aneurysm. Aortic atherosclerotic calcification incidentally noted. Reproductive: Prior hysterectomy noted. Adnexal regions are unremarkable in appearance. Other:  None. Musculoskeletal: No  suspicious bone lesions identified. Right hip prosthesis again noted. IMPRESSION: 2.3 cm soft tissue nodule along the superior aspect of the right nephrectomy bed, highly suspicious for recurrent carcinoma. No other sites of recurrent or metastatic carcinoma identified. Cholelithiasis. No radiographic evidence of cholecystitis. Stable 4.3 cm ascending thoracic aortic aneurysm. Recommend annual imaging followup by CTA or MRA. This recommendation follows 2010 ACCF/AHA/AATS/ACR/ASA/SCA/SCAI/SIR/STS/SVM Guidelines for the Diagnosis and Management of Patients with Thoracic Aortic Disease. Circulation. 2010; 121: G956-O130. Aortic aneurysm NOS (ICD10-I71.9) Electronically Signed   By: Danae Orleans M.D.   On: 09/18/2022 12:14     Assessment/Plan Malignant neoplasm metastatic to brain [facility]) [C79.31]  Jacqueline Palmer is clinically and radiographically stable today.  No new or progressive changes.  Continues on observation for renal cell carcinoma.    Recommended continuing Keppra at 500mg  BID.    We appreciate the opportunity to participate in the care of KINARA BOBIAN.    We ask that Jacqueline Palmer return to clinic in 4 months following next brain MRI, or sooner as needed.  All questions were answered. The patient knows to call the clinic with any problems, questions or concerns. No barriers to learning were detected.  The total time spent in the encounter was 30 minutes and more than 50% was on counseling and review of test results   Henreitta Leber, MD Medical Director of Neuro-Oncology [facility] 10/10/22 11:53 AM

## 2022-10-10 NOTE — Telephone Encounter (Signed)
Requested Prescriptions  Pending Prescriptions Disp Refills   colchicine 0.6 MG tablet [Pharmacy Med Name: Colchicine Oral Tablet 0.6 MG] 90 tablet 3    Sig: TAKE ONE TABLET BY MOUTH DAILY AT BEDTIME     Endocrinology:  Gout Agents - colchicine Failed - 10/09/2022 11:55 PM      Failed - Cr in normal range and within 360 days    Creatinine  Date Value Ref Range Status  03/02/2022 3.07 (HH) 0.44 - 1.00 mg/dL Final    Comment:    REPEATED TO VERIFY CRITICAL RESULT CALLED TO, READ BACK BY AND VERIFIED WITH: TAMMI HOLLAND, RN @ 1006 ON 10.12.2023 BY LAUREN BOWMAN, MLS     Creatinine, Ser  Date Value Ref Range Status  09/14/2022 2.83 (H) 0.44 - 1.00 mg/dL Final         Passed - ALT in normal range and within 360 days    ALT  Date Value Ref Range Status  09/14/2022 13 0 - 44 U/L Final  03/02/2022 16 0 - 44 U/L Final         Passed - AST in normal range and within 360 days    AST  Date Value Ref Range Status  09/14/2022 20 15 - 41 U/L Final  03/02/2022 21 15 - 41 U/L Final         Passed - Valid encounter within last 12 months    Recent Outpatient Visits           1 month ago Essential hypertension   [place] [facility] Marcine Matar, MD   6 months ago Essential hypertension   [place] [facility] Marcine Matar, MD   1 year ago Essential hypertension   [place] [facility] Marcine Matar, MD   1 year ago Essential hypertension   [place] [facility] Marcine Matar, MD   1 year ago COVID-19 virus infection   [place] [facility] Marcine Matar, MD       Future Appointments             In 2 months Marcine Matar, MD [facility]   In 8 months Lomax, Amy, NP [facility]            Passed - CBC within normal limits and completed in the last 12 months     WBC  Date Value Ref Range Status  09/14/2022 6.0 4.0 - 10.5 K/uL Final   RBC  Date Value Ref Range Status  09/14/2022 4.95 3.87 - 5.11 MIL/uL Final   Hemoglobin  Date Value Ref Range Status  09/14/2022 12.0 12.0 - 15.0 g/dL Final  30/86/5784 69.6 12.0 - 15.0 g/dL Final  29/52/8413 24.4 11.1 - 15.9 g/dL Final   HCT  Date Value Ref Range Status  09/14/2022 37.0 36.0 - 46.0 % Final   Hematocrit  Date Value Ref Range Status  08/18/2021 39.5 34.0 - 46.6 % Final   MCHC  Date Value Ref Range Status  09/14/2022 32.4 30.0 - 36.0 g/dL Final   [facility]  Date Value Ref Range Status  09/14/2022 24.2 (L) 26.0 - 34.0 pg Final   MCV  Date Value Ref Range Status  09/14/2022 74.7 (L) 80.0 - 100.0 fL Final  08/18/2021 82 79 - 97 fL Final   No results found for: "PLTCOUNTKUC", "LABPLAT", "POCPLA"  RDW  Date Value Ref Range Status  09/14/2022 14.9 11.5 - 15.5 % Final  08/18/2021 16.0 (H) 11.7 - 15.4 % Final

## 2022-10-12 ENCOUNTER — Ambulatory Visit: Payer: Medicare Other | Attending: Cardiology | Admitting: *Deleted

## 2022-10-12 ENCOUNTER — Inpatient Hospital Stay (HOSPITAL_BASED_OUTPATIENT_CLINIC_OR_DEPARTMENT_OTHER): Payer: Medicare Other | Admitting: Physician Assistant

## 2022-10-12 ENCOUNTER — Ambulatory Visit: Payer: Medicare Other | Admitting: Physician Assistant

## 2022-10-12 ENCOUNTER — Ambulatory Visit: Payer: Medicare Other | Admitting: Internal Medicine

## 2022-10-12 VITALS — BP 115/74 | HR 71 | Temp 97.4°F | Resp 17 | Ht 68.0 in | Wt 204.1 lb

## 2022-10-12 DIAGNOSIS — I130 Hypertensive heart and chronic kidney disease with heart failure and stage 1 through stage 4 chronic kidney disease, or unspecified chronic kidney disease: Secondary | ICD-10-CM

## 2022-10-12 DIAGNOSIS — C651 Malignant neoplasm of right renal pelvis: Secondary | ICD-10-CM

## 2022-10-12 DIAGNOSIS — N189 Chronic kidney disease, unspecified: Secondary | ICD-10-CM | POA: Diagnosis not present

## 2022-10-12 DIAGNOSIS — C641 Malignant neoplasm of right kidney, except renal pelvis: Secondary | ICD-10-CM

## 2022-10-12 DIAGNOSIS — C7931 Secondary malignant neoplasm of brain: Secondary | ICD-10-CM | POA: Diagnosis not present

## 2022-10-12 DIAGNOSIS — C649 Malignant neoplasm of unspecified kidney, except renal pelvis: Secondary | ICD-10-CM

## 2022-10-12 DIAGNOSIS — Z5181 Encounter for therapeutic drug level monitoring: Secondary | ICD-10-CM | POA: Diagnosis not present

## 2022-10-12 DIAGNOSIS — I509 Heart failure, unspecified: Secondary | ICD-10-CM

## 2022-10-12 DIAGNOSIS — I482 Chronic atrial fibrillation, unspecified: Secondary | ICD-10-CM

## 2022-10-12 DIAGNOSIS — Z90710 Acquired absence of both cervix and uterus: Secondary | ICD-10-CM

## 2022-10-12 DIAGNOSIS — Z7901 Long term (current) use of anticoagulants: Secondary | ICD-10-CM

## 2022-10-12 DIAGNOSIS — Z7189 Other specified counseling: Secondary | ICD-10-CM | POA: Insufficient documentation

## 2022-10-12 LAB — POCT INR: INR: 3.2 — AB (ref 2.0–3.0)

## 2022-10-12 NOTE — Patient Instructions (Signed)
Description   Today take 1/2 tablet of warfarin then continue taking Warfarin 1 tablet daily.  Recheck INR in 5 weeks.  Coumadin Clinic [phone]

## 2022-10-17 ENCOUNTER — Other Ambulatory Visit: Payer: Self-pay | Admitting: Internal Medicine

## 2022-10-19 ENCOUNTER — Ambulatory Visit: Payer: Medicare Other

## 2022-10-19 ENCOUNTER — Ambulatory Visit: Payer: Medicare Other | Admitting: Radiation Oncology

## 2022-10-19 ENCOUNTER — Encounter: Payer: Self-pay | Admitting: Radiation Oncology

## 2022-10-19 ENCOUNTER — Ambulatory Visit
Admission: RE | Admit: 2022-10-19 | Discharge: 2022-10-19 | Disposition: A | Discharge status: Home / Self Care | Payer: Medicare Other | Source: Ambulatory Visit | Attending: Radiation Oncology | Admitting: Radiation Oncology

## 2022-10-19 VITALS — BP 115/86 | HR 69 | Temp 97.0°F | Resp 18 | Ht 68.0 in | Wt 199.2 lb

## 2022-10-19 DIAGNOSIS — I4891 Unspecified atrial fibrillation: Secondary | ICD-10-CM | POA: Diagnosis not present

## 2022-10-19 DIAGNOSIS — C7951 Secondary malignant neoplasm of bone: Secondary | ICD-10-CM | POA: Diagnosis not present

## 2022-10-19 DIAGNOSIS — I110 Hypertensive heart disease with heart failure: Secondary | ICD-10-CM | POA: Insufficient documentation

## 2022-10-19 DIAGNOSIS — I6381 Other cerebral infarction due to occlusion or stenosis of small artery: Secondary | ICD-10-CM | POA: Diagnosis not present

## 2022-10-19 DIAGNOSIS — I700 Atherosclerosis of aorta: Secondary | ICD-10-CM | POA: Diagnosis not present

## 2022-10-19 DIAGNOSIS — C641 Malignant neoplasm of right kidney, except renal pelvis: Secondary | ICD-10-CM | POA: Diagnosis present

## 2022-10-19 DIAGNOSIS — I2510 Atherosclerotic heart disease of native coronary artery without angina pectoris: Secondary | ICD-10-CM | POA: Insufficient documentation

## 2022-10-19 DIAGNOSIS — Z79890 Hormone replacement therapy (postmenopausal): Secondary | ICD-10-CM | POA: Diagnosis not present

## 2022-10-19 DIAGNOSIS — I429 Cardiomyopathy, unspecified: Secondary | ICD-10-CM | POA: Diagnosis not present

## 2022-10-19 DIAGNOSIS — Z952 Presence of prosthetic heart valve: Secondary | ICD-10-CM | POA: Insufficient documentation

## 2022-10-19 DIAGNOSIS — C7931 Secondary malignant neoplasm of brain: Secondary | ICD-10-CM

## 2022-10-19 DIAGNOSIS — C649 Malignant neoplasm of unspecified kidney, except renal pelvis: Secondary | ICD-10-CM

## 2022-10-19 DIAGNOSIS — E039 Hypothyroidism, unspecified: Secondary | ICD-10-CM | POA: Diagnosis not present

## 2022-10-19 DIAGNOSIS — I509 Heart failure, unspecified: Secondary | ICD-10-CM | POA: Insufficient documentation

## 2022-10-19 DIAGNOSIS — Z79899 Other long term (current) drug therapy: Secondary | ICD-10-CM | POA: Insufficient documentation

## 2022-10-19 DIAGNOSIS — Z87891 Personal history of nicotine dependence: Secondary | ICD-10-CM | POA: Diagnosis not present

## 2022-10-19 DIAGNOSIS — Z7901 Long term (current) use of anticoagulants: Secondary | ICD-10-CM | POA: Diagnosis not present

## 2022-10-19 DIAGNOSIS — I7121 Aneurysm of the ascending aorta, without rupture: Secondary | ICD-10-CM | POA: Insufficient documentation

## 2022-10-19 NOTE — Progress Notes (Signed)
Radiation Oncology         ([phone]) [phone] ________________________________  Name: Jacqueline Palmer        MRN: [id number]  Date of Service: 10/19/2022 DOB: 1945-07-27  WU:[id number], Binnie Rail, MD  Si Gaul, MD     REFERRING PHYSICIAN: Si Gaul, MD   DIAGNOSIS: The primary encounter diagnosis was Renal cell carcinoma of right kidney [facility]). Diagnoses of Renal cell carcinoma, unspecified laterality (HCC) and Malignant neoplasm metastatic to brain [facility]) were also pertinent to this visit.   HISTORY OF PRESENT ILLNESS: Jacqueline Palmer is a 77 y.o. female with a history of metastatic renal cell carcinoma. She was scheduled for right nephrectomy on August 04, 2019, however came into the emergency room at [facility] on 07/24/2019 with a new onset of seizures.  CT scan in the emergency department revealed a 3.1 x 2 cm mass in her right frontal lobe concerning for metastatic disease versus a separate primary.  She had a mandibular dislocation and subluxation that was reduced.  Further CT imaging showed 3 nodules in the right lung, and she was taken to the operating room on 07/30/2019 for right frontal craniotomy.  Final pathology revealed a metastatic carcinoma consistent with a metastatic renal cell carcinoma.  She met with medical oncology following her recovery, and underwent postop SRS treatment with Dr. Adela Lank at [facility] in [place] which she completed in May 2021.  It appeared that she did not have any other further disease, so she did also undergo right nephrectomy on 10/16/2019 revealing a renal cell carcinoma.  1 node was sampled and was negative.  It is unclear what her treatment plan was going to be following this, however she got readmitted due to hydropneumothorax in June 2021 requiring chest tube.     She relocated her care to [facility] in 2021 and established her care with Dr. Clelia Croft. She was treated in October 2021 with salvage fractionated SRS to the surgical cavity. She has been  followed in the brain oncology program and with Dr. Clelia Croft in surveillance. She developed new disease in the  left frontal lobe and received SRS to this site in September 2022.     The patient relocated her care to Dr. Arbutus Ped after Dr. Alver Fisher departure from the clinic. She recently had a PET performed on 10/10/2022 for restaging.  She had hypermetabolic activity within the right nephrectomy bed measuring 2.5 x 1.5 cm with an SUV max of 24.9.  No other evidence of radiotracer activity was appreciated.  She was offered systemic immunotherapy but was interested in more localized treatment if appropriate and is seen today to discuss stereotactic body radiotherapy.  From a brain perspective, she continues to do well and is followed by Dr. Barbaraann Cao.  She was last seen in his office on 10/10/2022 to review a recent MRI scan and remains in surveillance with recommendations for another MRI in approximately 4 months time.  PREVIOUS RADIATION THERAPY: Yes   02/08/2021 SRS Treatment: Site Technique Total Dose (Gy) Dose per Fx (Gy) Completed Fx Beam Energies  Brain: Brain PTV_2FrontLobe 8 mm 3D 20/20 20 1/1 6XFFF    03/03/20-03/09/20 Salvage SRS: PTV1: Right Frontal 30 mm target was treated to 27 Gy in 3 fractions:  May 2021 Postop SRS with Dr. Adela Lank in [facility]: The patient received 27.5 Gy in 5 fractions to the right frontal lobe surgical cavity.   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   SN [id number] Model [id number] [From patient's surgical info card]   Presence  of permanent cardiac pacemaker    Seizures (HCC)    Thrombus 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 myocardial infarction    Thyroid disease        PAST SURGICAL HISTORY: Past Surgical History:  Procedure Laterality Date   ABDOMINAL HYSTERECTOMY     CORONARY ARTERY BYPASS GRAFT     CRANIOTOMY Right 07/30/2019   Right frontal   NEPHRECTOMY Right 10/16/2019   TOTAL HIP ARTHROPLASTY Right 07/11/2021   Procedure: RIGHT TOTAL HIP ARTHROPLASTY ANTERIOR APPROACH;  Surgeon: Gean Birchwood, MD;  Location: [facility] ORS;  Service: Orthopedics;  Laterality: Right;     FAMILY HISTORY:  Family History  Problem Relation Age of Onset   Heart attack Mother    Heart attack Father    Heart attack Sister    Breast cancer Neg Hx      SOCIAL HISTORY:  reports that she quit smoking about 8 years ago. Her smoking use included cigarettes. She has never used smokeless tobacco. She reports current alcohol use. She reports that she does not use drugs. The patient is widowed and lives in [place]. 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    ALLERGIES: Avocado, Banana, Plantain, Ace inhibitors, Codeine, Prednisone, Tape, Latex, and Pharmabase cosmetic [aquamed]   MEDICATIONS:  Current Outpatient Medications  Medication Sig Dispense Refill   acetaminophen (TYLENOL) 500 MG tablet Take 1,000 mg by mouth daily with breakfast.     colchicine 0.6 MG tablet TAKE ONE TABLET BY MOUTH DAILY AT BEDTIME 90 tablet 3   COVID-19 mRNA vaccine 2023-2024 (COMIRNATY) syringe Inject into the muscle. 0.3 mL 0   hydrALAZINE (APRESOLINE) 10 MG tablet Take 1 tablet (10 mg total) by mouth 3 (three) times daily. 270 tablet 2   levETIRAcetam (KEPPRA) 500 MG tablet Take 1 tablet (500 mg total) by mouth 2 (two) times daily. 180 tablet 3   levothyroxine (SYNTHROID) 25 MCG tablet TAKE ONE TABLET BY MOUTH ONE TIME DAILY IN THE MORNING 30-60 MINUTES BEFORE BREAKFAST ON EMPTY STOMACH WITH  FULL GLASS OF WATER 90 tablet 1   metoprolol succinate (TOPROL-XL) 25 MG 24 hr tablet TAKE ONE TABLET BY MOUTH ONE TIME DAILY 90 tablet 0   nitroGLYCERIN (NITROSTAT) 0.4 MG SL tablet Place 1 tablet (0.4 mg total) under the tongue every 5 (five) minutes as needed for chest pain. 25 tablet 3   OVER THE COUNTER MEDICATION Apply 1 application topically 2 (two) times daily. Pain cream in groin area     polyethylene glycol (MIRALAX / GLYCOLAX) 17 g packet Take 17 g by mouth daily as needed for mild constipation.     pravastatin (PRAVACHOL) 40 MG tablet Take 1 tablet (40 mg total) by mouth daily. 90 tablet 2   spironolactone (ALDACTONE) 25 MG tablet TAKE ONE TABLET BY MOUTH ONE TIME DAILY 90 tablet 0   torsemide (DEMADEX) 20 MG tablet TAKE 2 TABLETS BY MOUTH EVERY  MORNING AND 1 TABLET EVERY EVENING 270 tablet 1   warfarin (COUMADIN) 5 MG tablet TAKE ONE TABLET BY MOUTH ONE TIME DAILY IN THE EVENING OR AS DIRECTED BY COUMADIN CLINIC 100 tablet 0   No current facility-administered medications for this encounter.     REVIEW OF SYSTEMS: On review of systems, the patient reports that she is doing well and denies any abdominal pain, nausea, or difficulty with eating. She continues keppra but has not had any difficulty with seizures or headaches. She has some chronic low back discomfort at times. No other complaints are verbalized.   PHYSICAL EXAM:  Wt Readings from Last 3 Encounters:  10/19/22 199 lb 3.2 oz (90.4 kg)  10/12/22 204 lb 2 oz (92.6 kg)  10/10/22 198 lb 11.2 oz (90.1 kg)   Temp Readings from Last 3 Encounters:  10/19/22 (!) 97 F (36.1 C) (Oral)  10/12/22 (!) 97.4 F (36.3 C) (Oral)  10/10/22 97.9 F (36.6 C) (Oral)   BP Readings from Last 3 Encounters:  10/19/22 115/86  10/12/22 115/74  10/10/22 111/77   Pulse Readings from Last 3 Encounters:  10/19/22 69  10/12/22 71  10/10/22 71   Pain Assessment Pain Score: 0-No pain/10  In general this is a well appearing African  American female in no acute distress. She's alert and oriented x4 and appropriate throughout the examination. Cardiopulmonary assessment is negative for acute distress and she exhibits normal effort.     ECOG = 0  0 - Asymptomatic (Fully active, able to carry on all predisease activities without restriction)  1 - Symptomatic but completely ambulatory (Restricted in physically strenuous activity but ambulatory and able to carry out work of a light or sedentary nature. For example, light housework, office work)  2 - Symptomatic, <50% in bed during the day (Ambulatory and capable of all self care but unable to carry out any work activities. Up and about more than 50% of waking hours)  3 - Symptomatic, >50% in bed, but not bedbound (Capable of only limited self-care, confined to bed or chair 50% or more of waking hours)  4 - Bedbound (Completely disabled. Cannot carry on any self-care. Totally confined to bed or chair)  5 - Death   Santiago Glad MM, Creech RH, Tormey DC, et al. [phone]). "Toxicity and response criteria of the [facility]". Am. Evlyn Clines. Oncol. 5 (6): 649-55    LABORATORY DATA:  Lab Results  Component Value Date   WBC 6.0 09/14/2022   HGB 12.0 09/14/2022   HCT 37.0 09/14/2022   MCV 74.7 (L) 09/14/2022   PLT 253 09/14/2022   Lab Results  Component Value Date   NA 139 09/14/2022   K 4.6 09/14/2022   CL 105 09/14/2022   CO2 27 09/14/2022   Lab Results  Component Value Date   ALT 13 09/14/2022   AST 20 09/14/2022   ALKPHOS 103 09/14/2022   BILITOT 0.6 09/14/2022      RADIOGRAPHY: MR Brain W Wo Contrast  Result Date: 10/12/2022 CLINICAL DATA:  Brain/CNS neoplasm, assess treatment response. History of metastatic renal cell carcinoma. EXAM: MRI HEAD WITHOUT AND WITH CONTRAST TECHNIQUE: Multiplanar, multiecho pulse sequences of the brain and surrounding structures were obtained without and with intravenous contrast. CONTRAST:  10mL GADAVIST GADOBUTROL 1  MMOL/ML IV SOLN COMPARISON:  Head MRI 05/25/2022 FINDINGS: Brain: A resection cavity in the posterior right frontal lobe with associated chronic blood products and non masslike enhancement is unchanged. Surrounding T2 hyperintensity is also  unchanged. A 4 mm focus of cortical enhancement more inferiorly in the lateral aspect of the right frontal lobe is unchanged (series 20, image 121). No new enhancing intracranial lesions are identified. Scattered chronic microhemorrhages elsewhere in both cerebral hemispheres are unchanged. Patchy to confluent T2 hyperintensities in the cerebral white matter bilaterally are unchanged and nonspecific but may reflect a combination of chronic small vessel ischemia and post treatment changes. Chronic lacunar infarcts are again noted in the deep gray nuclei and cerebellum. A 1.8 cm focus of expansile, nonenhancing T2 hyperintensity involving cortex and juxtacortical white matter at the right temporoparietal junction is unchanged (series 13, image 44). Mild cerebral atrophy is within normal limits for age. No acute infarct, midline shift, or extra-axial fluid collection is evident. Vascular: Major intracranial vascular flow voids are preserved. Skull and upper cervical spine: Right frontal craniotomy. Unchanged 1 cm right frontal skull lesion extending through the inner table to the dura. Sinuses/Orbits: Chronic bilateral proptosis and depression of the right lamina papyracea. Paranasal sinuses and mastoid air cells are clear. Other: None. IMPRESSION: 1. Unchanged 4 mm focus of enhancement in the lateral right frontal lobe and unchanged appearance of the posterior right frontal resection cavity. 2. No evidence of new intracranial metastases. 3. Unchanged nonenhancing T2 hyperintensity at the right temporoparietal junction which may reflect a low-grade glioma or cortical dysplasia. 4. Unchanged right frontal skull metastasis. Electronically Signed   By: Sebastian Ache M.D.   On:  10/12/2022 08:20   NM PET Image Restage (PS) Skull Base to Thigh (F-18 FDG)  Result Date: 10/12/2022 CLINICAL DATA:  Initial treatment strategy for right renal cell carcinoma. EXAM: NUCLEAR MEDICINE PET SKULL BASE TO THIGH TECHNIQUE: 9.85 mCi F-18 FDG was injected intravenously. Full-ring PET imaging was performed from the skull base to thigh after the radiotracer. CT data was obtained and used for attenuation correction and anatomic localization. Fasting blood glucose: 76 mg/dl COMPARISON:  CT chest, abdomen and pelvis 09/14/2022 FINDINGS: Mediastinal blood pool activity: SUV max 2.85. Liver activity: SUV max NA NECK: No hypermetabolic lymph nodes in the neck. Incidental CT findings: Benign, tracer avid brown fat identified within the soft tissues of the neck. CHEST: No hypermetabolic mediastinal or hilar nodes. No suspicious pulmonary nodules on the CT scan. Incidental CT findings: Benign previous median sternotomy and CABG procedure. Left chest wall ICD noted with leads in the right atrial appendage, coronary sinus and right ventricle. Prosthetic mitral valve. Aortic atherosclerotic calcifications. Stable 4.3 cm ascending thoracic aortic aneurysm. Metabolically active brown fat is identified within the chest with a predominantly cape like distribution. There is also increased uptake localizing to the fat in bilateral axillary and paraspinal regions. ABDOMEN/PELVIS: Status post right nephrectomy. Corresponding to the recent CT findings there is a soft tissue nodule along the superior margin of the right nephrectomy bed. This measures 2.5 x 1.5 cm with SUV max 24.90, image 97/4. No abnormal tracer activity within the liver, pancreas, spleen, or adrenal glands. No tracer avid abdominopelvic lymph nodes. Incidental CT findings: Aortic atherosclerotic calcifications. SKELETON: No focal hypermetabolic activity to suggest skeletal metastasis. Incidental CT findings: None. IMPRESSION: 1. Status post right  nephrectomy. There is a soft tissue nodule along the superior margin of the nephrectomy bed which exhibits intense FDG uptake compatible with local tumor recurrence. 2. No signs of tracer avid distant metastatic disease. 3. Stable 4.3 cm ascending thoracic aortic aneurysm. Recommend annual imaging followup by CTA or MRA. This recommendation follows 2010 ACCF/AHA/AATS/ACR/ASA/SCA/SCAI/SIR/STS/SVM Guidelines for the Diagnosis and Management  of Patients with Thoracic Aortic Disease. Circulation. 2010; 121: [id number]. Aortic aneurysm NOS (ICD10-I71.9) 4.  Aortic Atherosclerosis (ICD10-I70.0). Electronically Signed   By: Signa Kell M.D.   On: 10/12/2022 08:19        IMPRESSION/PLAN: 1. 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  Dr. Mitzi Hansen discusses the patient's course since her last radiation treatment. He recommends stereotactic body radiotherapy, possibly ultrahypofractionated course to the right nephrectomy bed. The course could range from 5-10 fractions depending on her simulation. She has renal insufficiency with her remaining kidney so we will use oral contrast, but no IV contrast at the time of simulation. We discussed the risks, benefits, short, and long term effects of radiotherapy, as well as the curative intent locally. She understands that she may still need systemic immunotherapy in the future, and with this discussion, the patient is interested in proceeding with radiation. Dr. Mitzi Hansen discusses the delivery and logistics of radiotherapy and she will be contacted to coordinate simulation.  She will continue to be followed as well with Dr. Barbaraann Cao in the brain oncology program.   In a visit lasting 45 minutes, greater than 50% of the time was spent face to face discussing the patient's condition, in preparation for the discussion, and coordinating the patient's care.   The above documentation reflects my direct findings during this shared patient visit. Please see  the separate note by Dr. Mitzi Hansen on this date for the remainder of the patient's plan of care.    Osker Mason, PAC

## 2022-10-19 NOTE — Progress Notes (Signed)
Nursing interview for Renal cell carcinoma of right kidney (HCC).  Patient identity verified x2.  Patient reports doing well. Denies any discomfort at this time.  Meaningful use complete.  Vitals- BP 115/86 (BP Location: Left Arm, Patient Position: Sitting, Cuff Size: Large)   Pulse 69   Temp (!) 97 F (36.1 C) (Oral)   Resp 18   Ht 5\' 8"  (1.727 m)   Wt 199 lb 3.2 oz (90.4 kg)   SpO2 100%   BMI 30.29 kg/m    This concludes the interview.   [name], LPN

## 2022-10-25 NOTE — Progress Notes (Unsigned)
Cardiology Office Note Date:  10/25/2022  Patient ID:  Silver, [name] 1946/04/19, MRN [id number] PCP:  Marcine Matar, MD  Cardiologist:  Dr. Cristal Deer Electrophysiologist: Dr. Lalla Brothers  ***refresh   Chief Complaint: *** over due, needs device clearance.  History of Present Illness: Jacqueline Palmer is a 77 y.o. 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  She saw Dr. Lalla Brothers to establish device care.  She saw A. Tillery, PAc, 09/01/21, doing well without device concerns.  No changes were made, advised close monitoring of her renal function, spironolactone and K+  Following with gen cards team regularly, last with Dr. Cristal Deer, 03/17/22, recovering from a semi-recent THA, no CV concerns, symptoms. With single kidney/her history, not on ACE/ARB, deferred to nephrology clearance and any advancement of her GDMT.  Saw heme-onc 09/21/22, scan showed 2.3 cm soft tissue nodule along the superior aspect of the right nephrectomy bed suspicious for recurrent disease. Planned for PET and early follow up  10/12/22 oncology f/u PET scan showed the soft tissue nodule along the margin exhibits intense FDG uptake which is compatible with local tumor recurrence. Discussed discussed possible biopsy vs. starting systemic immunotherapy with ipilimumab and nivolumab.  Dr. Arbutus Ped discussed the side effects of immunotherapy.  The patient is likely not a surgical candidate due to her comorbidities and CKD. Pt did not want to pursue chemo, though would consider XRT (not felt an ideal option clinically)  Saw neurology 10/10/22, stable with no changes made.  *** warfarin, bleeding, [name] *** symptoms,  syncope *** volume *** labs, lytes *** BP% *** AVN  *** ?? Procedure planned  Device information BSCi CRT-D implanted 09/11/2016   Past Medical History:  Diagnosis Date   Acute kidney injury (HCC)    Arthritis    Atrial fibrillation (HCC)    Cancer (HCC)    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   Cardiomyopathy [facility])    CHF (congestive heart failure) (HCC)    Chronic a-fib (HCC) 08/14/2016   Chronic anticoagulation 08/14/2016   Coronary artery disease    4v CABG 6/17   Graves disease    Hypertension    Hypothyroidism    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   SN [id number] Model [id number] [From patient's surgical info card]   Presence of permanent cardiac pacemaker    Seizures (HCC)    Thrombus 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 myocardial infarction    Thyroid disease     Past Surgical History:  Procedure Laterality Date   ABDOMINAL HYSTERECTOMY     CORONARY ARTERY BYPASS GRAFT     CRANIOTOMY Right 07/30/2019   Right frontal   NEPHRECTOMY Right 10/16/2019   TOTAL HIP ARTHROPLASTY Right 07/11/2021   Procedure: RIGHT TOTAL HIP ARTHROPLASTY ANTERIOR APPROACH;  Surgeon: Gean Birchwood, MD;  Location: [facility];  Service: Orthopedics;  Laterality: Right;    Current Outpatient Medications  Medication Sig Dispense Refill   acetaminophen (TYLENOL) 500 MG tablet Take 1,000 mg by mouth daily with breakfast.     colchicine 0.6 MG tablet TAKE ONE TABLET BY MOUTH DAILY AT BEDTIME 90 tablet 3   COVID-19 mRNA vaccine 2023-2024 (COMIRNATY) syringe Inject into the muscle. 0.3 mL 0  hydrALAZINE (APRESOLINE) 10 MG tablet Take 1 tablet (10 mg total) by mouth 3 (three) times daily. 270 tablet 2   levETIRAcetam (KEPPRA) 500 MG tablet Take 1 tablet (500 mg total) by mouth 2 (two) times daily. 180 tablet 3   levothyroxine (SYNTHROID) 25 MCG tablet TAKE ONE TABLET BY MOUTH ONE TIME DAILY IN THE MORNING 30-60 MINUTES BEFORE BREAKFAST ON EMPTY STOMACH WITH FULL GLASS OF  WATER 90 tablet 1   metoprolol succinate (TOPROL-XL) 25 MG 24 hr tablet TAKE ONE TABLET BY MOUTH ONE TIME DAILY 90 tablet 0   nitroGLYCERIN (NITROSTAT) 0.4 MG SL tablet Place 1 tablet (0.4 mg total) under the tongue every 5 (five) minutes as needed for chest pain. 25 tablet 3   OVER THE COUNTER MEDICATION Apply 1 application topically 2 (two) times daily. Pain cream in groin area     polyethylene glycol (MIRALAX / GLYCOLAX) 17 g packet Take 17 g by mouth daily as needed for mild constipation.     pravastatin (PRAVACHOL) 40 MG tablet Take 1 tablet (40 mg total) by mouth daily. 90 tablet 2   spironolactone (ALDACTONE) 25 MG tablet TAKE ONE TABLET BY MOUTH ONE TIME DAILY 90 tablet 0   torsemide (DEMADEX) 20 MG tablet TAKE 2 TABLETS BY MOUTH EVERY MORNING AND 1 TABLET EVERY EVENING 270 tablet 1   warfarin (COUMADIN) 5 MG tablet TAKE ONE TABLET BY MOUTH ONE TIME DAILY IN THE EVENING OR AS DIRECTED BY COUMADIN CLINIC 100 tablet 0   No current facility-administered medications for this visit.    Allergies:   Avocado, Banana, Plantain, Ace inhibitors, Codeine, Prednisone, Tape, Latex, and Pharmabase cosmetic [aquamed]   Social History:  The patient  reports that she quit smoking about 8 years ago. Her smoking use included cigarettes. She has never used smokeless tobacco. She reports current alcohol use. She reports that she does not use drugs.   Family History:  The patient's family history includes Heart attack in her father, mother, and sister.  ROS:  Please see the history of present illness.    All other systems are reviewed and otherwise negative.   PHYSICAL EXAM:  VS:  There were no vitals taken for this visit. BMI: There is no height or weight on file to calculate BMI. Well nourished, well developed, in no acute distress HEENT: normocephalic, atraumatic Neck: no JVD, carotid bruits or masses Cardiac:  *** RRR; no significant murmurs, no rubs, or gallops Lungs:  *** CTA b/l, no wheezing,  rhonchi or rales Abd: soft, nontender MS: no deformity or *** atrophy Ext: *** no edema Skin: warm and dry, no rash Neuro:  No gross deficits appreciated Psych: euthymic mood, full affect  *** ICD site is stable, no tethering or discomfort   EKG:  Done today and reviewed by myself shows  ***  Device interrogation done today and reviewed by myself:  ***   Echo 12/03/20 1. Left ventricular ejection fraction, by estimation, is 60 to 65%. The  left ventricle has normal function. The left ventricle has no regional  wall motion abnormalities. Left ventricular diastolic function could not  be evaluated.   2. Right ventricular systolic function is normal. The right ventricular  size is not well visualized. There is normal pulmonary artery systolic  pressure. The estimated right ventricular systolic pressure is 25.8 mmHg.   3. Left atrial size was moderately dilated.   4. Right atrial size was mildly dilated.   5. The mitral valve has been repaired/replaced. No evidence  of mitral  valve regurgitation. The mean mitral valve gradient is 3.0 mmHg. There is  a present in the mitral position.   6. The aortic valve is tricuspid. Aortic valve regurgitation is trivial.  No aortic stenosis is present.   7. Aortic dilatation noted. There is mild dilatation of the ascending  aorta, measuring 42 mm.   8. The inferior vena cava is normal in size with greater than 50%  respiratory variability, suggesting right atrial pressure of 3 mmHg.  Recent Labs: 03/28/2022: TSH 3.820 09/14/2022: ALT 13; BUN 50; Creatinine, Ser 2.83; Hemoglobin 12.0; Platelets 253; Potassium 4.6; Sodium 139  03/28/2022: Chol/HDL Ratio 2.8; Cholesterol, Total 181; HDL 64; LDL Chol Calc (NIH) 96; Triglycerides 118   CrCl cannot be calculated (Patient's most recent lab result is older than the maximum 21 days allowed.).   Wt Readings from Last 3 Encounters:  10/19/22 199 lb 3.2 oz (90.4 kg)  10/12/22 204 lb 2 oz (92.6 kg)   10/10/22 198 lb 11.2 oz (90.1 kg)     Other studies reviewed: Additional studies/records reviewed today include: summarized above  ASSESSMENT AND PLAN:  CRT-D ***  Permanent AFib CHA2DS2Vasc is 6, on warfarin, monitored and managed by *** [name] Hx of AVN ablation *** rates  CAD ***  VHD S/p MVR (bioprosthetic)  NICM Recovered LVEF ***  Disposition: F/u with ***  Current medicines are reviewed at length with the patient today.  The patient did not have any concerns regarding medicines.  Norma Fredrickson, PA-C 10/25/2022 5:07 PM     [facility] [address] [place] [address] [phone] (office)  [phone] (fax)

## 2022-10-26 ENCOUNTER — Encounter: Payer: Self-pay | Admitting: Physician Assistant

## 2022-10-26 ENCOUNTER — Ambulatory Visit: Payer: Medicare Other | Attending: Physician Assistant | Admitting: Physician Assistant

## 2022-10-26 VITALS — BP 112/68 | HR 70 | Ht 68.0 in | Wt 195.8 lb

## 2022-10-26 DIAGNOSIS — Z952 Presence of prosthetic heart valve: Secondary | ICD-10-CM | POA: Insufficient documentation

## 2022-10-26 DIAGNOSIS — Z95810 Presence of automatic (implantable) cardiac defibrillator: Secondary | ICD-10-CM | POA: Diagnosis present

## 2022-10-26 DIAGNOSIS — I2510 Atherosclerotic heart disease of native coronary artery without angina pectoris: Secondary | ICD-10-CM | POA: Insufficient documentation

## 2022-10-26 DIAGNOSIS — I4821 Permanent atrial fibrillation: Secondary | ICD-10-CM | POA: Insufficient documentation

## 2022-10-26 DIAGNOSIS — D6869 Other thrombophilia: Secondary | ICD-10-CM | POA: Insufficient documentation

## 2022-10-26 DIAGNOSIS — I428 Other cardiomyopathies: Secondary | ICD-10-CM

## 2022-10-26 LAB — CUP PACEART INCLINIC DEVICE CHECK
Date Time Interrogation Session: 20240606191149
Implantable Lead Connection Status: 753985
Implantable Lead Connection Status: 753985
Implantable Lead Connection Status: 753985
Implantable Lead Implant Date: 20180423
Implantable Lead Implant Date: 20180423
Implantable Lead Implant Date: 20180423
Implantable Lead Location: 753858
Implantable Lead Location: 753859
Implantable Lead Location: 753860
Implantable Lead Model: 292
Implantable Lead Model: 4672
Implantable Lead Model: 7740
Implantable Lead Serial Number: 431793
Implantable Lead Serial Number: 602825
Implantable Lead Serial Number: 703305
Implantable Pulse Generator Implant Date: 20180423
Lead Channel Pacing Threshold Amplitude: 0.5 V
Lead Channel Pacing Threshold Amplitude: 0.6 V
Lead Channel Pacing Threshold Pulse Width: 0.4 ms
Lead Channel Pacing Threshold Pulse Width: 0.4 ms
Lead Channel Sensing Intrinsic Amplitude: 2.4 mV
Pulse Gen Serial Number: 174046

## 2022-10-26 NOTE — Patient Instructions (Signed)
Medication Instructions:   Your physician recommends that you continue on your current medications as directed. Please refer to the Current Medication list given to you today.   *If you need a refill on your cardiac medications before your next appointment, please call your pharmacy*   Lab Work: NONE ORDERED  TODAY   If you have labs (blood work) drawn today and your tests are completely normal, you will receive your results only by: MyChart Message (if you have MyChart) OR A paper copy in the mail If you have any lab test that is abnormal or we need to change your treatment, we will call you to review the results.   Testing/Procedures: NONE ORDERED  TODAY\   Follow-Up: At [facility], you and your health needs are our priority.  As part of our continuing mission to provide you with exceptional heart care, we have created designated Provider Care Teams.  These Care Teams include your primary Cardiologist (physician) and Advanced Practice Providers (APPs -  Physician Assistants and Nurse Practitioners) who all work together to provide you with the care you need, when you need it.  We recommend signing up for the patient portal called "MyChart".  Sign up information is provided on this After Visit Summary.  MyChart is used to connect with patients for Virtual Visits (Telemedicine).  Patients are able to view lab/test results, encounter notes, upcoming appointments, etc.  Non-urgent messages can be sent to your provider as well.   To learn more about what you can do with MyChart, go to [email or web address].    Your next appointment:   1 months   Provider:   You may see Boyland Prude, MD or one of the following Advanced Practice Providers on your designated Care Team:   Francis Dowse, [place]  Other Instructions

## 2022-10-28 ENCOUNTER — Other Ambulatory Visit: Payer: Self-pay | Admitting: Internal Medicine

## 2022-10-28 DIAGNOSIS — E890 Postprocedural hypothyroidism: Secondary | ICD-10-CM

## 2022-10-30 ENCOUNTER — Encounter: Payer: Self-pay | Admitting: Physician Assistant

## 2022-10-30 ENCOUNTER — Other Ambulatory Visit: Payer: Self-pay | Admitting: Cardiology

## 2022-10-30 ENCOUNTER — Encounter: Payer: Self-pay | Admitting: Cardiology

## 2022-10-30 NOTE — Progress Notes (Signed)
TO BE COMPLETED BY RADIATION ONCOLOGIST OFFICE:   Patient Name: Jacqueline Palmer   Date of Birth: 10-25-1945   Radiation Oncologist: Dr. Mitzi Hansen  Site to be Treated: Kidney RT  Will x-rays >10 MV be used? No  Will the radiation be >10 cm from the device? Yes  Planned Treatment Start Date:   TO BE COMPLETED BY CARDIOLOGIST OFFICE:   Device Information:  Pacemaker []      ICD [x]    Brand: [employer]: [phone] Model #: [employer] [id number] RESONATE X4 CRT-D  Serial Number: [id number]     Date of Placement: Chest  Site of Placement: 09/11/2016  Remote Device Check--Frequency: every 91 days   Last Check: 10/26/2022  Is the Patient Pacer Dependent?:  Yes []   No [x]   Does cardiologist request Radiation Oncology to schedule device testing by vendor for the following:  Prior to the Initiation of Treatments?  Yes []  No [x]  During Treatments?  Yes []  No [x]  Post Radiation Treatments?  Yes []  No [x]   Is device monitoring necessary by vendor/cardiologist team during treatments?  Yes []   No [x]   Is cardiac monitoring by Radiation Oncology nursing necessary during treatments? Yes []   No [x]   Do you recommend device be relocated prior to Radiation Treatment? Yes []   No [x]   **PLEASE LIST ANY NOTES OR SPECIAL REQUESTS:       CARDIOLOGIST SIGNATURE:  Dr. Steffanie Dunn Per Device Clinic Standing Orders, [name]  10/30/2022 11:37 AM  **Please route completed form back to Radiation Oncology Nursing and "P [facility] RAD ONC ADMIN", OR send an update if there will be a delay in having form completed by expected start date.  **Call [phone] if you have any questions or do not get an in-basket response from a Radiation Oncology staff member

## 2022-10-30 NOTE — Telephone Encounter (Signed)
Requested Prescriptions  Pending Prescriptions Disp Refills   levothyroxine (SYNTHROID) 25 MCG tablet [Pharmacy Med Name: Levothyroxine Sodium Oral Tablet 25 MCG] 90 tablet 0    Sig: TAKE 1 TABLET BY MOUTH ONCE DAILY IN THE MORNING 30 TO 60 MINUTES BEFORE BREAKFAST ON AN EMPTY STOMACH WITH A FULL GLASS OF WATER     Endocrinology:  Hypothyroid Agents Passed - 10/28/2022 12:43 PM      Passed - TSH in normal range and within 360 days    TSH  Date Value Ref Range Status  03/28/2022 3.820 0.450 - 4.500 uIU/mL Final         Passed - Valid encounter within last 12 months    Recent Outpatient Visits           2 months ago Essential hypertension   [place] [facility] Marcine Matar, MD   7 months ago Essential hypertension   [place] [facility] Marcine Matar, MD   1 year ago Essential hypertension   [place] [facility] Marcine Matar, MD   1 year ago Essential hypertension   [place] [facility] Marcine Matar, MD   1 year ago COVID-19 virus infection   [place] [facility] Marcine Matar, MD       Future Appointments             In 1 month Laural Benes, Binnie Rail, MD [facility]   In 7 months Lomax, Amy, NP [facility]

## 2022-10-30 NOTE — Telephone Encounter (Signed)
Please call pt to schedule overdue 6 month f/u with Dr. Cristal Deer or APP. 90 day refill for Spironolactone sent with no refills. Thank you!

## 2022-10-30 NOTE — Telephone Encounter (Signed)
Rx request sent to pharmacy.  

## 2022-10-31 ENCOUNTER — Ambulatory Visit (HOSPITAL_COMMUNITY): Payer: Medicare Other

## 2022-10-31 ENCOUNTER — Other Ambulatory Visit: Payer: Self-pay

## 2022-10-31 ENCOUNTER — Ambulatory Visit
Admission: RE | Admit: 2022-10-31 | Discharge: 2022-10-31 | Disposition: A | Discharge status: Home / Self Care | Payer: Medicare Other | Source: Ambulatory Visit | Attending: Radiation Oncology | Admitting: Radiation Oncology

## 2022-10-31 DIAGNOSIS — C641 Malignant neoplasm of right kidney, except renal pelvis: Secondary | ICD-10-CM | POA: Insufficient documentation

## 2022-10-31 DIAGNOSIS — C7931 Secondary malignant neoplasm of brain: Secondary | ICD-10-CM | POA: Diagnosis not present

## 2022-10-31 DIAGNOSIS — Z510 Encounter for antineoplastic radiation therapy: Secondary | ICD-10-CM | POA: Diagnosis present

## 2022-11-01 NOTE — Telephone Encounter (Signed)
Left message for patient to call and schedule over due appointment for medication refills

## 2022-11-03 ENCOUNTER — Telehealth: Payer: Self-pay | Admitting: Cardiology

## 2022-11-03 NOTE — Telephone Encounter (Signed)
Left message for patient to call and schedule the overdue follow up appointment with Dr. Cristal Deer / APP for medication refills

## 2022-11-03 NOTE — Telephone Encounter (Signed)
Here ya go!  

## 2022-11-03 NOTE — Telephone Encounter (Signed)
Pt stated she was retuning a call to [facility] and she'd like a callback. Please advise

## 2022-11-03 NOTE — Telephone Encounter (Signed)
Patient is scheduled 01/01/23 with Gillian Shields, NP

## 2022-11-14 DIAGNOSIS — Z510 Encounter for antineoplastic radiation therapy: Secondary | ICD-10-CM | POA: Diagnosis not present

## 2022-11-15 ENCOUNTER — Other Ambulatory Visit: Payer: Self-pay

## 2022-11-15 ENCOUNTER — Ambulatory Visit
Admission: RE | Admit: 2022-11-15 | Discharge: 2022-11-15 | Disposition: A | Discharge status: Home / Self Care | Payer: Medicare Other | Source: Ambulatory Visit | Attending: Radiation Oncology | Admitting: Radiation Oncology

## 2022-11-15 DIAGNOSIS — Z510 Encounter for antineoplastic radiation therapy: Secondary | ICD-10-CM | POA: Diagnosis not present

## 2022-11-15 LAB — RAD ONC ARIA SESSION SUMMARY
Course Elapsed Days: 0
Plan Fractions Treated to Date: 1
Plan Prescribed Dose Per Fraction: 10 Gy
Plan Total Fractions Prescribed: 5
Plan Total Prescribed Dose: 50 Gy
Reference Point Dosage Given to Date: 10 Gy
Reference Point Session Dosage Given: 10 Gy
Session Number: 1

## 2022-11-16 ENCOUNTER — Ambulatory Visit: Payer: Medicare Other | Attending: Cardiology | Admitting: *Deleted

## 2022-11-16 ENCOUNTER — Other Ambulatory Visit: Payer: Self-pay | Admitting: Cardiology

## 2022-11-16 DIAGNOSIS — I482 Chronic atrial fibrillation, unspecified: Secondary | ICD-10-CM | POA: Insufficient documentation

## 2022-11-16 DIAGNOSIS — Z7901 Long term (current) use of anticoagulants: Secondary | ICD-10-CM | POA: Insufficient documentation

## 2022-11-16 LAB — POCT INR: INR: 2.8 (ref 2.0–3.0)

## 2022-11-16 NOTE — Patient Instructions (Signed)
Description   Continue taking Warfarin 1 tablet daily. Recheck INR in 6 weeks. Coumadin Clinic [phone]     

## 2022-11-16 NOTE — Telephone Encounter (Signed)
Refill request for warfarin:  Last INR was 10/12/22 Next INR due 11/16/22 LOV was 10/26/22  Refill approved.

## 2022-11-17 ENCOUNTER — Ambulatory Visit
Admission: RE | Admit: 2022-11-17 | Discharge: 2022-11-17 | Disposition: A | Discharge status: Home / Self Care | Payer: Medicare Other | Source: Ambulatory Visit | Attending: Radiation Oncology | Admitting: Radiation Oncology

## 2022-11-17 ENCOUNTER — Other Ambulatory Visit: Payer: Self-pay

## 2022-11-17 DIAGNOSIS — Z510 Encounter for antineoplastic radiation therapy: Secondary | ICD-10-CM | POA: Diagnosis not present

## 2022-11-17 LAB — RAD ONC ARIA SESSION SUMMARY
Course Elapsed Days: 2
Plan Fractions Treated to Date: 2
Plan Prescribed Dose Per Fraction: 10 Gy
Plan Total Fractions Prescribed: 5
Plan Total Prescribed Dose: 50 Gy
Reference Point Dosage Given to Date: 20 Gy
Reference Point Session Dosage Given: 10 Gy
Session Number: 2

## 2022-11-20 ENCOUNTER — Other Ambulatory Visit: Payer: Self-pay

## 2022-11-20 ENCOUNTER — Ambulatory Visit
Admission: RE | Admit: 2022-11-20 | Discharge: 2022-11-20 | Disposition: A | Discharge status: Home / Self Care | Payer: Medicare Other | Source: Ambulatory Visit | Attending: Radiation Oncology | Admitting: Radiation Oncology

## 2022-11-20 DIAGNOSIS — C7931 Secondary malignant neoplasm of brain: Secondary | ICD-10-CM | POA: Diagnosis not present

## 2022-11-20 DIAGNOSIS — C641 Malignant neoplasm of right kidney, except renal pelvis: Secondary | ICD-10-CM | POA: Diagnosis not present

## 2022-11-20 DIAGNOSIS — Z510 Encounter for antineoplastic radiation therapy: Secondary | ICD-10-CM | POA: Insufficient documentation

## 2022-11-20 LAB — RAD ONC ARIA SESSION SUMMARY
Course Elapsed Days: 5
Plan Fractions Treated to Date: 3
Plan Prescribed Dose Per Fraction: 10 Gy
Plan Total Fractions Prescribed: 5
Plan Total Prescribed Dose: 50 Gy
Reference Point Dosage Given to Date: 30 Gy
Reference Point Session Dosage Given: 10 Gy
Session Number: 3

## 2022-11-21 ENCOUNTER — Ambulatory Visit (INDEPENDENT_AMBULATORY_CARE_PROVIDER_SITE_OTHER): Payer: Medicare Other | Admitting: Family

## 2022-11-21 ENCOUNTER — Encounter (HOSPITAL_BASED_OUTPATIENT_CLINIC_OR_DEPARTMENT_OTHER): Payer: Self-pay | Admitting: Family

## 2022-11-21 VITALS — BP 110/70 | HR 72 | Ht 68.0 in | Wt 182.0 lb

## 2022-11-21 DIAGNOSIS — Z952 Presence of prosthetic heart valve: Secondary | ICD-10-CM | POA: Diagnosis not present

## 2022-11-21 DIAGNOSIS — I4821 Permanent atrial fibrillation: Secondary | ICD-10-CM

## 2022-11-21 DIAGNOSIS — I482 Chronic atrial fibrillation, unspecified: Secondary | ICD-10-CM

## 2022-11-21 DIAGNOSIS — Z951 Presence of aortocoronary bypass graft: Secondary | ICD-10-CM | POA: Diagnosis not present

## 2022-11-21 DIAGNOSIS — I2510 Atherosclerotic heart disease of native coronary artery without angina pectoris: Secondary | ICD-10-CM

## 2022-11-21 DIAGNOSIS — Z95810 Presence of automatic (implantable) cardiac defibrillator: Secondary | ICD-10-CM

## 2022-11-21 DIAGNOSIS — I428 Other cardiomyopathies: Secondary | ICD-10-CM

## 2022-11-21 DIAGNOSIS — D6859 Other primary thrombophilia: Secondary | ICD-10-CM

## 2022-11-21 MED ORDER — NITROGLYCERIN 0.4 MG SL SUBL
0.4000 mg | SUBLINGUAL_TABLET | SUBLINGUAL | 3 refills | Status: AC | PRN
Start: 2022-11-21 — End: ?

## 2022-11-21 NOTE — Progress Notes (Signed)
Cardiology Office Note:  .   Date:  11/21/2022  ID:  Jacqueline Palmer, DOB 04/29/1946, MRN [id number] PCP: Marcine Matar, MD  [facility] Providers Cardiologist:  Jodelle Red, MD Electrophysiologist:  Petzold Prude, MD    Nephrology: Dr. Thedore Mins  History of Present Illness: .   Jacqueline Palmer is a 77 y.o. female with hx of hypertension, CAD s/p 4V CABG, MAZE, LAA ligation, bioprosthetic MVR (for rheumatic heart disease) 11/17/2015, permanent atrial fibrillation (AV node ablation 09/11/16 at [facility]) , chronic diastolic heart failure with recovered systolic dysfunction, hypothyroidism, CKD stage 4, metastatic renal cancer s/p radical nephrectomy and craniotomy for resection of brain tumor. Last seen by Dr. Cristal Deer 02/2022 and by EP team 10/2022.   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  Presently undergoing XRT for renal cell carcinoma.   Pleasant lady who resides at [facility]. Participating in PT twice per week. No chest pain, dyspnea, edema, orthopnea, PND, nor palpitations. She is mostly following low sodium heart healthy diet and tries to make healthy choices in dining hall. Notes feeling fatigue with XRT therapy - she has dose #5 later this week and follow up with Dr. Mitzi Hansen to discuss next steps. Is understandably hesitant regarding immunotherapy and wished to try XRT first.   ROS: Please see the history of present illness.    All other systems reviewed and are negative.   Studies Reviewed: .        Cardiac Studies & Procedures       ECHOCARDIOGRAM  ECHOCARDIOGRAM COMPLETE 12/03/2020  Narrative ECHOCARDIOGRAM REPORT    Patient Name:   Jacqueline Palmer Date of Exam: 12/03/2020 Medical Rec #:  [id number]    Height:       68.0 in Accession #:    [id number]   Weight:       196.0 lb Date of Birth:  1946/03/30    BSA:          2.026 m Patient Age:    75 years     BP:           113/84 mmHg Patient  Gender: F            HR:           70 bpm. Exam Location:  Inpatient  Procedure: 2D Echo, Color Doppler and Cardiac Doppler  Indications:    R07.9* Chest pain, unspecified  History:        Patient has prior history of Echocardiogram examinations, most recent 11/03/2019. CHF, Prior CABG and Defibrillator, Arrythmias:Atrial Fibrillation; Risk Factors:Hypertension. 11/17/15 MVR 31mm Edwards Perimount Plus Bioprosthetic.  Mitral Valve: valve is present in the mitral position.  Sonographer:    Irving Burton Senior RDCS Referring Phys: [id number] CHING T TU  IMPRESSIONS   1. Left ventricular ejection fraction, by estimation, is 60 to 65%. The left ventricle has normal function. The left ventricle has no regional wall motion abnormalities. Left ventricular diastolic function could not be evaluated. 2. Right ventricular systolic function is normal. The right ventricular size is not well visualized. There is normal pulmonary artery systolic pressure. The estimated right ventricular systolic pressure is 25.8 mmHg. 3. Left atrial size was moderately dilated. 4. Right atrial size was mildly dilated. 5. The mitral valve has been repaired/replaced. No evidence of mitral valve regurgitation. The mean mitral valve gradient is 3.0 mmHg. There is a present in the mitral position. 6. The aortic valve is tricuspid. Aortic  valve regurgitation is trivial. No aortic stenosis is present. 7. Aortic dilatation noted. There is mild dilatation of the ascending aorta, measuring 42 mm. 8. The inferior vena cava is normal in size with greater than 50% respiratory variability, suggesting right atrial pressure of 3 mmHg.  Comparison(s): Prior images unable to be directly viewed, comparison made by report only.  FINDINGS Left Ventricle: Left ventricular ejection fraction, by estimation, is 60 to 65%. The left ventricle has normal function. The left ventricle has no regional wall motion abnormalities. The left ventricular internal  cavity size was normal in size. There is no left ventricular hypertrophy. Abnormal (paradoxical) septal motion consistent with post-operative status. Left ventricular diastolic function could not be evaluated due to mitral valve replacement. Left ventricular diastolic function could not be evaluated.  Right Ventricle: The right ventricular size is not well visualized. Right vetricular wall thickness was not well visualized. Right ventricular systolic function is normal. There is normal pulmonary artery systolic pressure. The tricuspid regurgitant velocity is 2.39 m/s, and with an assumed right atrial pressure of 3 mmHg, the estimated right ventricular systolic pressure is 25.8 mmHg.  Left Atrium: Left atrial size was moderately dilated.  Right Atrium: Right atrial size was mildly dilated.  Pericardium: There is no evidence of pericardial effusion.  Mitral Valve: The mitral valve has been repaired/replaced. No evidence of mitral valve regurgitation. There is a present in the mitral position. MV peak gradient, 6.2 mmHg. The mean mitral valve gradient is 3.0 mmHg with average heart rate of 70 bpm.  Tricuspid Valve: The tricuspid valve is normal in structure. Tricuspid valve regurgitation is mild.  Aortic Valve: The aortic valve is tricuspid. Aortic valve regurgitation is trivial. No aortic stenosis is present.  Pulmonic Valve: The pulmonic valve was not well visualized. Pulmonic valve regurgitation is mild.  Aorta: The aortic root is normal in size and structure and aortic dilatation noted. There is mild dilatation of the ascending aorta, measuring 42 mm.  Venous: The inferior vena cava is normal in size with greater than 50% respiratory variability, suggesting right atrial pressure of 3 mmHg.  IAS/Shunts: No atrial level shunt detected by color flow Doppler.  Additional Comments: A device lead is visualized in the right ventricle.   LEFT VENTRICLE PLAX 2D LVIDd:         3.70 cm   Diastology LVIDs:         2.50 cm  LV e' medial:    8.38 cm/s LV PW:         1.00 cm  LV E/e' medial:  13.6 LV IVS:        1.10 cm  LV e' lateral:   8.05 cm/s LVOT diam:     2.00 cm  LV E/e' lateral: 14.2 LV SV:         47 LV SV Index:   23 LVOT Area:     3.14 cm   RIGHT VENTRICLE RV S prime:     7.72 cm/s TAPSE (M-mode): 1.4 cm  LEFT ATRIUM             Index       RIGHT ATRIUM           Index LA diam:        4.60 cm 2.27 cm/m  RA Area:     20.90 cm LA Vol (A2C):   51.1 ml 25.22 ml/m RA Volume:   52.70 ml  26.01 ml/m LA Vol (A4C):   76.1 ml 37.56 ml/m LA  Biplane Vol: 64.6 ml 31.88 ml/m AORTIC VALVE LVOT Vmax:   78.40 cm/s LVOT Vmean:  52.400 cm/s LVOT VTI:    0.149 m  AORTA Ao Root diam: 3.65 cm Ao Asc diam:  4.20 cm  MITRAL VALVE                TRICUSPID VALVE MV Area (PHT): 1.65 cm     TR Peak grad:   22.8 mmHg MV Area VTI:   1.52 cm     TR Vmax:        239.00 cm/s MV Peak grad:  6.2 mmHg MV Mean grad:  3.0 mmHg     SHUNTS MV Vmax:       1.24 m/s     Systemic VTI:  0.15 m MV Vmean:      80.2 cm/s    Systemic Diam: 2.00 cm MV Decel Time: 461 msec MV E velocity: 114.00 cm/s  Rachelle Hora Croitoru MD Electronically signed by Thurmon Fair MD Signature Date/Time: 12/03/2020/12:18:05 PM    Final             Risk Assessment/Calculations:    CHA2DS2-VASc Score = 6   This indicates a 9.7% annual risk of stroke. The patient's score is based upon: CHF History: 1 HTN History: 1 Diabetes History: 0 Stroke History: 0 Vascular Disease History: 1 Age Score: 2 Gender Score: 1            Physical Exam:   VS:  BP 110/70   Pulse 72   Ht 5\' 8"  (1.727 m)   Wt 182 lb (82.6 kg)   BMI 27.67 kg/m    Wt Readings from Last 3 Encounters:  11/21/22 182 lb (82.6 kg)  10/26/22 195 lb 12.8 oz (88.8 kg)  10/19/22 199 lb 3.2 oz (90.4 kg)    GEN: Well nourished, well developed in no acute distress NECK: No JVD; No carotid bruits CARDIAC: IRIR, no murmurs, rubs,  gallops RESPIRATORY:  Clear to auscultation without rales, wheezing or rhonchi  ABDOMEN: Soft, non-tender, non-distended EXTREMITIES:  No edema; No deformity   ASSESSMENT AND PLAN: .    CRT-D - Follows with EP. No ICD shocks.   Permanent atrial fibrillation / Hypercoagulable state - Follows with Ep. Rate controlled today. Continue Warfarin, follows closely with Coumadin Clinic. Denies bleeding complications.   CAD / HLD, LDL goal <70 - Stable with no anginal symptoms. No indication for ischemic evaluation.  GDMT includes Pravastatin, Metoprolol. No ASA due to [facility]. 03/2022 LDL 95 at which time Pravastatin increased to 40mg  every day. Will ask nephrology team to add on direct LDL to upcoming labs.   HTN - BP well controlled. Continue current antihypertensive regimen.    S/p MVR - Functioning appropriately by echo 11/2020.  No new heart failure symptoms.  No indication for repeat echocardiogram at this time.  CKD - Careful titration of diuretic and antihypertensive.  Follows with Dr. Thedore Mins of [facility].    TAA - PET 09/2022 stable 4.3 cm ascending thoracic aortic aneurysm. Continue optimal BP control and beta blocker. Avoid fluoroquinolones. Consider repeat study in one year - can coordinate at follow up.   NICM with recovered LVEF - Euvolemic and well compensated on exam. GDMT  metoprolol, torsemide, spironolactone.  No SGLT2i due to renal disease. Low sodium diet, fluid restriction <2L, and daily weights encouraged. Educated to contact our office for weight gain of 2 lbs overnight or 5 lbs in one week.        Dispo:  follow up in 6 months with Dr. Cristal Deer or APP  Signed, Alver Sorrow, NP

## 2022-11-21 NOTE — Patient Instructions (Signed)
Medication Instructions:  Continue your current medications.   *If you need a refill on your cardiac medications before your next appointment, please call your pharmacy*  Follow-Up: At [facility], you and your health needs are our priority.  As part of our continuing mission to provide you with exceptional heart care, we have created designated Provider Care Teams.  These Care Teams include your primary Cardiologist (physician) and Advanced Practice Providers (APPs -  Physician Assistants and Nurse Practitioners) who all work together to provide you with the care you need, when you need it.  We recommend signing up for the patient portal called "MyChart".  Sign up information is provided on this After Visit Summary.  MyChart is used to connect with patients for Virtual Visits (Telemedicine).  Patients are able to view lab/test results, encounter notes, upcoming appointments, etc.  Non-urgent messages can be sent to your provider as well.   To learn more about what you can do with MyChart, go to [email or web address].    Your next appointment:   6 month(s)  Provider:   Jodelle Red, MD or Gillian Shields, NP    Other Instructions  Will ask Dr. Thedore Mins to add on direct LDL to your next labs.

## 2022-11-22 ENCOUNTER — Ambulatory Visit
Admission: RE | Admit: 2022-11-22 | Discharge: 2022-11-22 | Disposition: A | Discharge status: Home / Self Care | Payer: Medicare Other | Source: Ambulatory Visit | Attending: Radiation Oncology | Admitting: Radiation Oncology

## 2022-11-22 ENCOUNTER — Other Ambulatory Visit: Payer: Self-pay

## 2022-11-22 DIAGNOSIS — Z510 Encounter for antineoplastic radiation therapy: Secondary | ICD-10-CM | POA: Diagnosis not present

## 2022-11-22 LAB — RAD ONC ARIA SESSION SUMMARY
Course Elapsed Days: 7
Plan Fractions Treated to Date: 4
Plan Prescribed Dose Per Fraction: 10 Gy
Plan Total Fractions Prescribed: 5
Plan Total Prescribed Dose: 50 Gy
Reference Point Dosage Given to Date: 40 Gy
Reference Point Session Dosage Given: 10 Gy
Session Number: 4

## 2022-11-24 ENCOUNTER — Ambulatory Visit
Admission: RE | Admit: 2022-11-24 | Discharge: 2022-11-24 | Disposition: A | Discharge status: Home / Self Care | Payer: Medicare Other | Source: Ambulatory Visit | Attending: Radiation Oncology | Admitting: Radiation Oncology

## 2022-11-24 ENCOUNTER — Other Ambulatory Visit: Payer: Self-pay

## 2022-11-24 DIAGNOSIS — Z510 Encounter for antineoplastic radiation therapy: Secondary | ICD-10-CM | POA: Diagnosis not present

## 2022-11-24 LAB — RAD ONC ARIA SESSION SUMMARY
Course Elapsed Days: 9
Plan Fractions Treated to Date: 5
Plan Prescribed Dose Per Fraction: 10 Gy
Plan Total Fractions Prescribed: 5
Plan Total Prescribed Dose: 50 Gy
Reference Point Dosage Given to Date: 50 Gy
Reference Point Session Dosage Given: 10 Gy
Session Number: 5

## 2022-11-27 NOTE — Radiation Completion Notes (Addendum)
  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 The patient tolerated radiation.    The patient will receive a call in about one month from the radiation oncology department. She will continue follow up with Dr. Arbutus Ped as well.      Osker Mason, PAC

## 2022-11-30 ENCOUNTER — Ambulatory Visit (INDEPENDENT_AMBULATORY_CARE_PROVIDER_SITE_OTHER): Payer: Medicare Other

## 2022-11-30 ENCOUNTER — Ambulatory Visit: Payer: Medicare Other | Admitting: Cardiology

## 2022-11-30 DIAGNOSIS — I428 Other cardiomyopathies: Secondary | ICD-10-CM | POA: Diagnosis not present

## 2022-11-30 LAB — CUP PACEART REMOTE DEVICE CHECK
Battery Remaining Longevity: 30 mo
Battery Remaining Percentage: 37 %
Brady Statistic RA Percent Paced: 0 %
Brady Statistic RV Percent Paced: 100 %
Date Time Interrogation Session: 20240711000100
HighPow Impedance: 70 Ohm
Implantable Lead Connection Status: 753985
Implantable Lead Connection Status: 753985
Implantable Lead Connection Status: 753985
Implantable Lead Implant Date: 20180423
Implantable Lead Implant Date: 20180423
Implantable Lead Implant Date: 20180423
Implantable Lead Location: 753858
Implantable Lead Location: 753859
Implantable Lead Location: 753860
Implantable Lead Model: 292
Implantable Lead Model: 4672
Implantable Lead Model: 7740
Implantable Lead Serial Number: 431793
Implantable Lead Serial Number: 602825
Implantable Lead Serial Number: 703305
Implantable Pulse Generator Implant Date: 20180423
Lead Channel Impedance Value: 1021 Ohm
Lead Channel Impedance Value: 427 Ohm
Lead Channel Impedance Value: 518 Ohm
Lead Channel Pacing Threshold Amplitude: 0.5 V
Lead Channel Pacing Threshold Amplitude: 0.5 V
Lead Channel Pacing Threshold Pulse Width: 0.4 ms
Lead Channel Pacing Threshold Pulse Width: 0.4 ms
Lead Channel Setting Pacing Amplitude: 1.5 V
Lead Channel Setting Pacing Amplitude: 2 V
Lead Channel Setting Pacing Pulse Width: 0.4 ms
Lead Channel Setting Pacing Pulse Width: 0.4 ms
Lead Channel Setting Sensing Sensitivity: 0.6 mV
Lead Channel Setting Sensing Sensitivity: 1 mV
Pulse Gen Serial Number: 174046
Zone Setting Status: 755011

## 2022-12-12 ENCOUNTER — Other Ambulatory Visit: Payer: Self-pay | Admitting: Internal Medicine

## 2022-12-14 ENCOUNTER — Encounter: Payer: Self-pay | Admitting: Internal Medicine

## 2022-12-14 ENCOUNTER — Ambulatory Visit: Payer: Medicare Other | Attending: Internal Medicine | Admitting: Internal Medicine

## 2022-12-14 VITALS — BP 121/74 | HR 70 | Temp 97.8°F | Ht 68.0 in | Wt 195.0 lb

## 2022-12-14 DIAGNOSIS — C641 Malignant neoplasm of right kidney, except renal pelvis: Secondary | ICD-10-CM | POA: Insufficient documentation

## 2022-12-14 DIAGNOSIS — I482 Chronic atrial fibrillation, unspecified: Secondary | ICD-10-CM

## 2022-12-14 DIAGNOSIS — N184 Chronic kidney disease, stage 4 (severe): Secondary | ICD-10-CM | POA: Insufficient documentation

## 2022-12-14 DIAGNOSIS — I10 Essential (primary) hypertension: Secondary | ICD-10-CM

## 2022-12-14 DIAGNOSIS — Z86010 Personal history of colonic polyps: Secondary | ICD-10-CM | POA: Diagnosis not present

## 2022-12-14 DIAGNOSIS — C799 Secondary malignant neoplasm of unspecified site: Secondary | ICD-10-CM | POA: Diagnosis not present

## 2022-12-14 DIAGNOSIS — Z951 Presence of aortocoronary bypass graft: Secondary | ICD-10-CM | POA: Insufficient documentation

## 2022-12-14 DIAGNOSIS — I5032 Chronic diastolic (congestive) heart failure: Secondary | ICD-10-CM | POA: Insufficient documentation

## 2022-12-14 DIAGNOSIS — I130 Hypertensive heart and chronic kidney disease with heart failure and stage 1 through stage 4 chronic kidney disease, or unspecified chronic kidney disease: Secondary | ICD-10-CM | POA: Insufficient documentation

## 2022-12-14 DIAGNOSIS — I4821 Permanent atrial fibrillation: Secondary | ICD-10-CM | POA: Insufficient documentation

## 2022-12-14 DIAGNOSIS — I2510 Atherosclerotic heart disease of native coronary artery without angina pectoris: Secondary | ICD-10-CM | POA: Insufficient documentation

## 2022-12-14 DIAGNOSIS — Z79899 Other long term (current) drug therapy: Secondary | ICD-10-CM | POA: Diagnosis not present

## 2022-12-14 DIAGNOSIS — E890 Postprocedural hypothyroidism: Secondary | ICD-10-CM | POA: Insufficient documentation

## 2022-12-14 DIAGNOSIS — Z79890 Hormone replacement therapy (postmenopausal): Secondary | ICD-10-CM | POA: Diagnosis not present

## 2022-12-14 DIAGNOSIS — Z1211 Encounter for screening for malignant neoplasm of colon: Secondary | ICD-10-CM

## 2022-12-14 DIAGNOSIS — Z7901 Long term (current) use of anticoagulants: Secondary | ICD-10-CM | POA: Diagnosis not present

## 2022-12-14 MED ORDER — PRAVASTATIN SODIUM 40 MG PO TABS: 40.0000 mg | ORAL_TABLET | Freq: Every day | ORAL | 4 refills | Status: DC

## 2022-12-14 MED ORDER — LEVOTHYROXINE SODIUM 25 MCG PO TABS: ORAL_TABLET | ORAL | 4 refills | Status: DC

## 2022-12-14 MED ORDER — SPIRONOLACTONE 25 MG PO TABS: 25.0000 mg | ORAL_TABLET | Freq: Every day | ORAL | 4 refills | Status: DC

## 2022-12-14 MED ORDER — TORSEMIDE 20 MG PO TABS: ORAL_TABLET | ORAL | 4 refills | Status: DC

## 2022-12-14 MED ORDER — HYDRALAZINE HCL 10 MG PO TABS: 10.0000 mg | ORAL_TABLET | Freq: Three times a day (TID) | ORAL | 4 refills | Status: DC

## 2022-12-14 MED ORDER — METOPROLOL SUCCINATE ER 25 MG PO TB24: 25.0000 mg | ORAL_TABLET | Freq: Every day | ORAL | 4 refills | Status: DC

## 2022-12-14 NOTE — Progress Notes (Signed)
Patient ID: Jacqueline Palmer, female    DOB: [birth year]  MRN: [id number]  CC: Hypertension (HTN f/u. Med refills. Franchot Erichsen medication refills /Yes to shingles vax. )   Subjective: Jacqueline Palmer is a 77 y.o. female who presents for  chronic ds management Her concerns today include:  Patient with history of CAD with history CABGx4, chronic diastolic CHF/NICM (recovered LVEF) with BV-ICD, HTN, rheumatic heart disease status post MVR replacement, permanent A. 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     CKD 4:  followed by Dr. Thedore Mins.  See 10/2022.  Kidney function stable with GFR  range 17-22.  Most recent was 19.  HTN/CAD/A-fib:  Compliant with  medications including Coumadin, hydralazine 10 mg 3 times a day, metoprolol 25 mg daily, pravastatin 40 mg daily, spironolactone 25 mg daily and torsemide 2 tablets by mouth every morning and 1 tablet in the evenings. Needing some Rfs.  90 day supplies and on medications but patient states that the medicines the pharmacy would only give 30 days at a time. No CP/SOB/LE edema/palpitations Not sure whether she has had any bruising.  She has noted 2 small dark spots on the left upper thigh that has become lighter over the past 2 weeks.   Doing P.T 2x/wk at her AL facility.  Reports she is doing well and more confident in taking stairs. Trying to get rid of the cane.    Hx of Renal Cell CA RT:  Saw Dr. Cornelia Copa had a restaging CT scan and follow-up with Dr. Arbutus Ped in early May 2024; CT showed a new 2.3 cm soft tissue nodule along the superior aspect of the right nephrectomy bed suspicious for recurrent disease.  PET scan  confirmed that this is likely recurrence.  Patient was not interested in systemic therapy.  Referred to radiation oncology for XRT.  Has completed 5 XRT treatments and has f/u appt coming with her oncologist.   Hypothyroid: Reports compliance with taking levothyroxine.  HM: Overdue for repeat colonoscopy given history of colon polyps.  I requested her records from [facility].  I did not get the colonoscopy report but did see the pathology report from colonoscopy that was done 08/03/2017 she had a tubular adenomatous polyp removed from the ascending colon and one in the descending colon.  Plan was for repeat colonoscopy in 5 years.  Patient wanted to have this done at [facility].  I submitted that referral on last visit.  They called her but did not schedule the appointment stating that they were waiting to get colonoscopy report from me. Patient Active Problem List   Diagnosis Date Noted   Goals of care, counseling/discussion 10/12/2022   Coronary artery disease of autologous vein bypass graft with stable angina pectoris (HCC) 08/14/2022   Advance directive in chart 04/24/2021   Ascending aortic aneurysm (HCC) 04/24/2021   Gait disturbance 04/19/2021   Acute-on-chronic kidney injury (HCC) 12/03/2020   Subtherapeutic international normalized ratio (INR) 12/03/2020   Chest pain 12/02/2020   Neuropathic pain 11/25/2020   Seizures (HCC) 06/16/2020   Over weight 05/03/2020   Nonischemic cardiomyopathy (HCC) 03/04/2020   Biventricular automatic implantable cardioverter defibrillator in situ 02/19/2020   Chronic  diastolic heart failure (HCC) 02/19/2020   Hx of CABG 02/19/2020   Coronary artery disease involving native coronary artery of native heart without angina pectoris 02/19/2020   Essential hypertension 02/19/2020   Permanent atrial fibrillation (HCC) 02/19/2020   CKD (chronic kidney disease) stage 4, GFR 15-29 ml/min (HCC) 02/19/2020   Renal cell carcinoma of right kidney (HCC)  02/12/2020   Malignant neoplasm metastatic to brain (HCC) 02/12/2020   Gout attack 08/14/2016   Chronic a-fib (HCC) 08/14/2016   Chronic anticoagulation 08/14/2016   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     Current Outpatient Medications on File Prior to Visit  Medication Sig Dispense Refill   acetaminophen (TYLENOL) 500 MG tablet Take 1,000 mg by mouth daily with breakfast.     colchicine 0.6 MG tablet TAKE ONE TABLET BY MOUTH DAILY AT BEDTIME 90 tablet 3   levETIRAcetam (KEPPRA) 500 MG tablet Take 1 tablet (500 mg total) by mouth 2 (two) times daily. 180 tablet 3   nitroGLYCERIN (NITROSTAT) 0.4 MG SL tablet Place 1 tablet (0.4 mg total) under the tongue every 5 (five) minutes as needed for chest pain. 25 tablet 3   OVER THE COUNTER MEDICATION Apply 1 application topically 2 (two) times daily. Pain cream in groin area     polyethylene glycol (MIRALAX / GLYCOLAX) 17 g packet Take 17 g by mouth daily as needed for mild constipation.     warfarin (COUMADIN) 5 MG tablet TAKE ONE TABLET BY MOUTH DAILY IN THE EVENING OR AS DIRECTED BY THE COUMADIN CLINIC 100 tablet 0   No current facility-administered medications on file prior to visit.    Allergies  Allergen Reactions   Avocado Shortness Of Breath    Before 2007   Banana Anaphylaxis and Swelling   Plantain Anaphylaxis and Swelling   Ace Inhibitors Cough   Codeine Other (See Comments)    Nausea and Feels Jittery   Prednisone Swelling    Of face and lower extremities   Tape Hives    Tape  Does better with paper tape    Latex Rash   Pharmabase Cosmetic [[place]qu[place]] Swelling and Rash    Some Soap, perfumes,detergent    Social History   Socioeconomic History   Marital status: Widowed    Spouse name: Not on file   Number of children: 0   Years of education: Doctorate   Highest education level: Doctorate  Occupational History   Occupation: Retired Orthoptist  Tobacco Use   Smoking status: Former    Current packs/day: 0.00    Types:  Cigarettes    Quit date: 07/30/2014    Years since quitting: 8.3   Smokeless tobacco: Never  Vaping Use   Vaping status: Never Used  Substance and Sexual Activity   Alcohol use: Not Currently    Comment: 1 glass wine twice weekly   Drug use: No   Sexual activity: Not on file  Other Topics Concern   Not on file  Social History Narrative   Right handed   Coffee daily/tea qod   Social Determinants of Health   Financial Resource Strain: Medium Risk (12/11/2022)   Overall Financial Resource Strain (CARDIA)    Difficulty of Paying Living Expenses: Somewhat hard  Food Insecurity: Food Insecurity Present (12/11/2022)   Hunger Vital Sign    Worried About Running Out of Food in the Last Year: Sometimes true    Ran Out of Food in the Last Year: Never true  Transportation Needs: No Transportation Needs (12/11/2022)   PRAPARE -  Administrator, Civil Service (Medical): No    Lack of Transportation (Non-Medical): No  Physical Activity: Insufficiently Active (12/11/2022)   Exercise Vital Sign    Days of Exercise per Week: 3 days    Minutes of Exercise per Session: 30 min  Stress: Stress Concern Present (12/11/2022)   [employer] of Occupational Health - Occupational Stress Questionnaire    Feeling of Stress : To some extent  Social Connections: Moderately Integrated (12/11/2022)   Social Connection and Isolation Panel [NHANES]    Frequency of Communication with Friends and Family: More than three times a week    Frequency of Social Gatherings with Friends and Family: More than three times a week    Attends Religious Services: More than 4 times per year    Active Member of [employer] or Organizations: Yes    Attends Banker Meetings: More than 4 times per year    Marital Status: Widowed  Intimate Partner Violence: Not At Risk (10/19/2022)   Humiliation, Afraid, Rape, and Kick questionnaire    Fear of Current or Ex-Partner: No    Emotionally Abused: No    Physically  Abused: No    Sexually Abused: No    Family History  Problem Relation Age of Onset   Heart attack Mother    Heart attack Father    Heart attack Sister    Breast cancer Neg Hx     Past Surgical History:  Procedure Laterality Date   ABDOMINAL HYSTERECTOMY     CORONARY ARTERY BYPASS GRAFT     CRANIOTOMY Right 07/30/2019   Right frontal   NEPHRECTOMY Right 10/16/2019   TOTAL HIP ARTHROPLASTY Right 07/11/2021   Procedure: RIGHT TOTAL HIP ARTHROPLASTY ANTERIOR APPROACH;  Surgeon: Gean Birchwood, MD;  Location: [facility];  Service: Orthopedics;  Laterality: Right;    ROS: Review of Systems Negative except as stated above  PHYSICAL EXAM: BP 121/74 (BP Location: Left Arm, Patient Position: Sitting, Cuff Size: Normal)   Pulse 70   Temp 97.8 F (36.6 C) (Oral)   Ht 5\' 8"  (1.727 m)   Wt 195 lb (88.5 kg)   SpO2 99%   BMI 29.65 kg/m   Physical Exam  General appearance - alert, well appearing, elderly African-American female and in no distress Mental status - normal mood, behavior, speech, dress, motor activity, and thought processes Chest - clear to auscultation, no wheezes, rales or rhonchi, symmetric air entry Heart - normal rate, regular rhythm, normal S1, S2, no murmurs, rubs, clicks or gallops Extremities - peripheral pulses normal, no pedal edema, no clubbing or cyanosis MSK: Ambulates with a cane. Skin: She has about 2 cm hyperpigmented spot on the left upper thigh.  Similar 1 a little lower about the same size.    Latest Ref Rng & Units 09/14/2022    9:39 AM 03/28/2022   11:57 AM 03/02/2022    9:09 AM  CMP  Glucose 70 - 99 mg/dL 77   79   BUN 8 - 23 mg/dL 50   54   Creatinine 6.57 - 1.00 mg/dL 8.46   9.62   Sodium 952 - 145 mmol/L 139   138   Potassium 3.5 - 5.1 mmol/L 4.6   3.8   Chloride 98 - 111 mmol/L 105   102   CO2 22 - 32 mmol/L 27   28   Calcium 8.9 - 10.3 mg/dL 84.1   9.7   Total Protein 6.5 - 8.1 g/dL 7.8  7.7  8.0   Total Bilirubin 0.3 - 1.2 mg/dL 0.6  0.6   0.5   Alkaline Phos 38 - 126 U/L 103  122  122   AST 15 - 41 U/L 20  24  21    ALT 0 - 44 U/L 13  18  16     Lipid Panel     Component Value Date/Time   CHOL 181 03/28/2022 1157   TRIG 118 03/28/2022 1157   HDL 64 03/28/2022 1157   CHOLHDL 2.8 03/28/2022 1157   LDLCALC 96 03/28/2022 1157    CBC    Component Value Date/Time   WBC 6.0 09/14/2022 0939   RBC 4.95 09/14/2022 0939   HGB 12.0 09/14/2022 0939   HGB 12.3 03/02/2022 0909   HGB 12.4 08/18/2021 1219   HCT 37.0 09/14/2022 0939   HCT 39.5 08/18/2021 1219   PLT 253 09/14/2022 0939   PLT 254 03/02/2022 0909   PLT 292 08/18/2021 1219   MCV 74.7 (L) 09/14/2022 0939   MCV 82 08/18/2021 1219   MCH 24.2 (L) 09/14/2022 0939   MCHC 32.4 09/14/2022 0939   RDW 14.9 09/14/2022 0939   RDW 16.0 (H) 08/18/2021 1219   LYMPHSABS 2.3 09/14/2022 0939   MONOABS 0.9 09/14/2022 0939   EOSABS 0.2 09/14/2022 0939   BASOSABS 0.1 09/14/2022 0939    ASSESSMENT AND PLAN: 1. Essential hypertension At goal.  Continue torsemide, spironolactone 25 mg daily, hydralazine 10 mg 3 times a day and metoprolol 25 mg daily. - torsemide (DEMADEX) 20 MG tablet; TAKE 2 TABLETS BY MOUTH EVERY MORNING AND 1 TABLET EVERY EVENING  Dispense: 270 tablet; Refill: 4 - spironolactone (ALDACTONE) 25 MG tablet; Take 1 tablet (25 mg total) by mouth daily. Please schedule appointment with Dr. Cristal Deer for refills.  Dispense: 90 tablet; Refill: 4 - metoprolol succinate (TOPROL-XL) 25 MG 24 hr tablet; Take 1 tablet (25 mg total) by mouth daily.  Dispense: 90 tablet; Refill: 4 - hydrALAZINE (APRESOLINE) 10 MG tablet; Take 1 tablet (10 mg total) by mouth 3 (three) times daily.  Dispense: 270 tablet; Refill: 4  2. CKD (chronic kidney disease) stage 4, GFR 15-29 ml/min (HCC) Stable and followed by nephrology.   3. Postablative hypothyroidism Continue levothyroxine 25 mg daily. - levothyroxine (SYNTHROID) 25 MCG tablet; TAKE 1 TABLET BY MOUTH ONCE DAILY IN THE MORNING 30  TO 60 MINUTES BEFORE BREAKFAST ON AN EMPTY STOMACH WITH A FULL GLASS OF WATER  Dispense: 90 tablet; Refill: 4  4. Chronic a-fib (HCC) Stable.  Continue metoprolol 25 mg daily.  Coumadin is being checked through anticoagulation clinic.  5. Coronary artery disease involving native coronary artery of native heart without angina pectoris Stable.  Continue metoprolol and pravastatin - metoprolol succinate (TOPROL-XL) 25 MG 24 hr tablet; Take 1 tablet (25 mg total) by mouth daily.  Dispense: 90 tablet; Refill: 4 - pravastatin (PRAVACHOL) 40 MG tablet; Take 1 tablet (40 mg total) by mouth daily.  Dispense: 90 tablet; Refill: 4  6. Renal cell carcinoma of right kidney metastatic to other site [facility]) Local reoccurrence.  She just completed XRT.  Has follow-up with her oncologist  7. Screening for colon cancer Will resubmit referral to [facility] gastroenterology.  I put information in the referral letting them know that I had requested the colonoscopy report from [facility] but only received the pathology report from the colonoscopy that was done 08/03/2017.  She had 2 adenomatous polyps removed. - Ambulatory referral to Gastroenterology  8. History of colon polyps -  Ambulatory referral to Gastroenterology    Patient was given the opportunity to ask questions.  Patient verbalized understanding of the plan and was able to repeat key elements of the plan.   This documentation was completed using Paediatric nurse.  Any transcriptional errors are unintentional.  No orders of the defined types were placed in this encounter.    Requested Prescriptions   Signed Prescriptions Disp Refills   torsemide (DEMADEX) 20 MG tablet 270 tablet 4    Sig: TAKE 2 TABLETS BY MOUTH EVERY MORNING AND 1 TABLET EVERY EVENING   spironolactone (ALDACTONE) 25 MG tablet 90 tablet 4    Sig: Take 1 tablet (25 mg total) by mouth daily. Please schedule appointment with Dr. Cristal Deer for refills.    levothyroxine (SYNTHROID) 25 MCG tablet 90 tablet 4    Sig: TAKE 1 TABLET BY MOUTH ONCE DAILY IN THE MORNING 30 TO 60 MINUTES BEFORE BREAKFAST ON AN EMPTY STOMACH WITH A FULL GLASS OF WATER   metoprolol succinate (TOPROL-XL) 25 MG 24 hr tablet 90 tablet 4    Sig: Take 1 tablet (25 mg total) by mouth daily.   pravastatin (PRAVACHOL) 40 MG tablet 90 tablet 4    Sig: Take 1 tablet (40 mg total) by mouth daily.   hydrALAZINE (APRESOLINE) 10 MG tablet 270 tablet 4    Sig: Take 1 tablet (10 mg total) by mouth 3 (three) times daily.    Return in about 4 months (around 04/16/2023).  Jonah Blue, MD, FACP

## 2022-12-19 NOTE — Progress Notes (Signed)
Remote ICD transmission.   

## 2022-12-21 ENCOUNTER — Ambulatory Visit (HOSPITAL_BASED_OUTPATIENT_CLINIC_OR_DEPARTMENT_OTHER): Payer: Medicare Other | Admitting: Family

## 2022-12-25 ENCOUNTER — Telehealth: Payer: Self-pay

## 2022-12-25 NOTE — Telephone Encounter (Signed)
Let Jacqueline Palmer from [facility] gastroenterology know that I received patient's records from [facility]. Colonoscopy report was not enclosed but the pathology report from the colonoscopy that was done 08/03/2017 was closed.  She had 2 tubular adenomatous polyps removed 1 from the ascending colon and one in the descending colon.  Plan was for repeat colonoscopy in 5 years which would have been 07/2022.  Patient did not wish to return to [facility] as she gets all of her care now through [facility].

## 2022-12-25 NOTE — Telephone Encounter (Signed)
Copied from CRM [id number]. Topic: General - Other >> Dec 25, 2022  2:14 PM Dondra Prader A wrote: Reason for CRM: Shanda Bumps with [facility] states that they received a referral from the office and it notates pt had a colonoscopy in  2019 and is requesting records, per Shanda Bumps they do not have any reports for the pt.

## 2022-12-27 ENCOUNTER — Telehealth: Payer: Self-pay | Admitting: Gastroenterology

## 2022-12-27 NOTE — Telephone Encounter (Signed)
Called & inquired to be connected to Shanda Bumps but was informed that there are 2 in the office. Ended up speaking to Shon at [facility]. Shon stated that she is unable to take the information over the phone and will need the physical record was received  to be sent to [facility] for the provider to review.

## 2022-12-27 NOTE — Telephone Encounter (Signed)
I called and spoke with Shanda Bumps with [facility] this morning.  Informed her that I did not get colonoscopy report from [facility] but did get the pathology report.  Pathology report has been scanned into EMR.  She states they will follow-up with the knee medical to try to get the colonoscopy report.

## 2022-12-28 ENCOUNTER — Ambulatory Visit: Payer: Medicare Other | Attending: Cardiology | Admitting: *Deleted

## 2022-12-28 DIAGNOSIS — I482 Chronic atrial fibrillation, unspecified: Secondary | ICD-10-CM | POA: Insufficient documentation

## 2022-12-28 DIAGNOSIS — Z7901 Long term (current) use of anticoagulants: Secondary | ICD-10-CM | POA: Insufficient documentation

## 2022-12-28 LAB — POCT INR: INR: 2.9 (ref 2.0–3.0)

## 2022-12-28 NOTE — Patient Instructions (Signed)
Description   Continue taking Warfarin 1 tablet daily.  Recheck INR in 6 weeks.  [facility] [phone]      

## 2023-01-08 ENCOUNTER — Encounter: Payer: Medicare Other | Admitting: Cardiology

## 2023-01-12 ENCOUNTER — Ambulatory Visit (HOSPITAL_COMMUNITY)
Admission: RE | Admit: 2023-01-12 | Discharge: 2023-01-12 | Disposition: A | Discharge status: Home / Self Care | Payer: Medicare Other | Source: Ambulatory Visit | Attending: Physician Assistant | Admitting: Physician Assistant

## 2023-01-12 ENCOUNTER — Inpatient Hospital Stay: Payer: Medicare Other | Attending: Internal Medicine

## 2023-01-12 DIAGNOSIS — Z923 Personal history of irradiation: Secondary | ICD-10-CM | POA: Insufficient documentation

## 2023-01-12 DIAGNOSIS — C649 Malignant neoplasm of unspecified kidney, except renal pelvis: Secondary | ICD-10-CM

## 2023-01-12 DIAGNOSIS — C641 Malignant neoplasm of right kidney, except renal pelvis: Secondary | ICD-10-CM | POA: Insufficient documentation

## 2023-01-12 DIAGNOSIS — C7931 Secondary malignant neoplasm of brain: Secondary | ICD-10-CM | POA: Insufficient documentation

## 2023-01-12 DIAGNOSIS — C651 Malignant neoplasm of right renal pelvis: Secondary | ICD-10-CM | POA: Insufficient documentation

## 2023-01-12 DIAGNOSIS — Z905 Acquired absence of kidney: Secondary | ICD-10-CM | POA: Insufficient documentation

## 2023-01-12 LAB — CMP (CANCER CENTER ONLY)
ALT: 19 U/L (ref 0–44)
AST: 23 U/L (ref 15–41)
Albumin: 4.1 g/dL (ref 3.5–5.0)
Alkaline Phosphatase: 88 U/L (ref 38–126)
Anion gap: 7 (ref 5–15)
BUN: 42 mg/dL — ABNORMAL HIGH (ref 8–23)
CO2: 26 mmol/L (ref 22–32)
Calcium: 9.3 mg/dL (ref 8.9–10.3)
Chloride: 103 mmol/L (ref 98–111)
Creatinine: 2.5 mg/dL — ABNORMAL HIGH (ref 0.44–1.00)
GFR, Estimated: 19 mL/min — ABNORMAL LOW (ref >60)
Glucose, Bld: 98 mg/dL (ref 70–99)
Potassium: 4.2 mmol/L (ref 3.5–5.1)
Sodium: 136 mmol/L (ref 135–145)
Total Bilirubin: 0.5 mg/dL (ref 0.3–1.2)
Total Protein: 7.3 g/dL (ref 6.5–8.1)

## 2023-01-12 LAB — CBC WITH DIFFERENTIAL (CANCER CENTER ONLY)
Abs Immature Granulocytes: 0.02 10*3/uL (ref 0.00–0.07)
Basophils Absolute: 0.1 10*3/uL (ref 0.0–0.1)
Basophils Relative: 1 %
Eosinophils Absolute: 0.2 10*3/uL (ref 0.0–0.5)
Eosinophils Relative: 3 %
HCT: 35.7 % — ABNORMAL LOW (ref 36.0–46.0)
Hemoglobin: 11.6 g/dL — ABNORMAL LOW (ref 12.0–15.0)
Immature Granulocytes: 0 %
Lymphocytes Relative: 25 %
Lymphs Abs: 1.3 10*3/uL (ref 0.7–4.0)
MCH: 24.2 pg — ABNORMAL LOW (ref 26.0–34.0)
MCHC: 32.5 g/dL (ref 30.0–36.0)
MCV: 74.5 fL — ABNORMAL LOW (ref 80.0–100.0)
Monocytes Absolute: 0.9 10*3/uL (ref 0.1–1.0)
Monocytes Relative: 17 %
Neutro Abs: 2.8 10*3/uL (ref 1.7–7.7)
Neutrophils Relative %: 54 %
Platelet Count: 182 10*3/uL (ref 150–400)
RBC: 4.79 MIL/uL (ref 3.87–5.11)
RDW: 15.4 % (ref 11.5–15.5)
WBC Count: 5.3 10*3/uL (ref 4.0–10.5)
nRBC: 0 % (ref 0.0–0.2)

## 2023-01-16 ENCOUNTER — Inpatient Hospital Stay: Payer: Medicare Other | Admitting: Internal Medicine

## 2023-01-16 VITALS — BP 107/78 | HR 70 | Temp 98.2°F | Resp 17 | Ht 68.0 in | Wt 190.5 lb

## 2023-01-16 DIAGNOSIS — Z923 Personal history of irradiation: Secondary | ICD-10-CM | POA: Diagnosis not present

## 2023-01-16 DIAGNOSIS — C641 Malignant neoplasm of right kidney, except renal pelvis: Secondary | ICD-10-CM

## 2023-01-16 DIAGNOSIS — Z905 Acquired absence of kidney: Secondary | ICD-10-CM | POA: Diagnosis not present

## 2023-01-16 DIAGNOSIS — C7931 Secondary malignant neoplasm of brain: Secondary | ICD-10-CM | POA: Diagnosis not present

## 2023-01-16 NOTE — Progress Notes (Signed)
[facility] [phone]   [phone]  OFFICE PROGRESS NOTE  Marcine Matar, MD 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  PRIOR THERAPY:  1) Status post right frontal craniotomy and resection of solitary brain metastasis in March 2021 followed by postoperative SRS at [facility]. 2) Status post right radical nephrectomy on Oct 16, 2019 with the final pathologic stage of pT3a, pN0.  The final pathology showed indeterminant subtype with nuclear grade 3. 3) Status post stereotactic radiotherapy on March 03, 2020 to metastatic brain metastasis when the patient developed 2.0 cm mass in the right frontal lobe consistent with recurrent disease. 4) Status post SRS on February 08, 2021 for another isolated left frontal lobe metastasis. 5) palliative radiotherapy to the soft tissue nodule along the superior margin of the nephrectomy bed that was hypermetabolic on the PET scan under the care of Dr. Mitzi Hansen.  CURRENT THERAPY: Observation.  INTERVAL HISTORY: Jacqueline Palmer 77 y.o. female returns to the clinic today for follow-up visit.  The patient is feeling fine today with no concerning complaints except for mild fatigue.  She denied having any current chest pain, shortness of breath, cough or hemoptysis.  She has no nausea, vomiting, diarrhea or constipation.  She has no headache or visual changes.  She denied having any recent weight loss or night sweats.  She has no fever or chills.  She tolerated the previous radiotherapy to the nephrectomy bed soft tissue nodule fairly well.  She is here for evaluation with repeat CT scan of the chest, abdomen and pelvis for restaging of her disease.  MEDICAL HISTORY: Past Medical History:  Diagnosis Date   Acute kidney injury (HCC)    Arthritis     Atrial fibrillation (HCC)    Cancer (HCC)    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   Cardiomyopathy [facility])    CHF (congestive heart failure) (HCC)    Chronic a-fib (HCC) 08/14/2016   Chronic anticoagulation 08/14/2016   Coronary artery disease    4v CABG 6/17   Graves disease    Hypertension    Hypothyroidism    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   SN [id number] Model [id number] [From patient's surgical info card]   Presence of permanent cardiac pacemaker    Seizures (HCC)    Thrombus 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 myocardial infarction    Thyroid disease     ALLERGIES:  is allergic to avocado, banana, plantain, ace inhibitors, codeine, prednisone, tape, latex, and pharmabase cosmetic [aquamed].  MEDICATIONS:  Current Outpatient Medications  Medication Sig Dispense Refill   acetaminophen (TYLENOL) 500 MG tablet Take 1,000 mg by mouth daily with breakfast.     colchicine 0.6 MG tablet TAKE ONE TABLET BY MOUTH DAILY AT BEDTIME 90 tablet 3   hydrALAZINE (APRESOLINE) 10 MG tablet Take 1 tablet (10 mg total) by mouth 3 (three) times daily. 270 tablet 4   levETIRAcetam (KEPPRA) 500 MG tablet Take 1 tablet (500 mg total) by mouth 2 (two) times daily. 180 tablet 3   levothyroxine (SYNTHROID) 25 MCG tablet TAKE 1 TABLET BY MOUTH ONCE DAILY IN THE MORNING 30 TO 60 MINUTES BEFORE BREAKFAST ON AN EMPTY STOMACH WITH A FULL GLASS OF WATER 90 tablet 4   metoprolol succinate (TOPROL-XL) 25 MG 24  hr tablet Take 1 tablet (25 mg total) by mouth daily. 90 tablet 4   nitroGLYCERIN (NITROSTAT) 0.4 MG SL tablet Place 1 tablet (0.4 mg total) under the tongue every 5 (five) minutes as needed for chest pain. 25 tablet 3   OVER THE COUNTER MEDICATION Apply 1 application topically 2 (two) times daily. Pain cream in groin area     polyethylene glycol (MIRALAX / GLYCOLAX) 17 g packet Take 17 g by mouth daily as needed for mild constipation.     pravastatin (PRAVACHOL) 40 MG tablet  Take 1 tablet (40 mg total) by mouth daily. 90 tablet 4   spironolactone (ALDACTONE) 25 MG tablet Take 1 tablet (25 mg total) by mouth daily. Please schedule appointment with Dr. Cristal Deer for refills. 90 tablet 4   torsemide (DEMADEX) 20 MG tablet TAKE 2 TABLETS BY MOUTH EVERY MORNING AND 1 TABLET EVERY EVENING 270 tablet 4   warfarin (COUMADIN) 5 MG tablet TAKE ONE TABLET BY MOUTH DAILY IN THE EVENING OR AS DIRECTED BY THE COUMADIN CLINIC 100 tablet 0   No current facility-administered medications for this visit.    SURGICAL HISTORY:  Past Surgical History:  Procedure Laterality Date   ABDOMINAL HYSTERECTOMY     CORONARY ARTERY BYPASS GRAFT     CRANIOTOMY Right 07/30/2019   Right frontal   NEPHRECTOMY Right 10/16/2019   TOTAL HIP ARTHROPLASTY Right 07/11/2021   Procedure: RIGHT TOTAL HIP ARTHROPLASTY ANTERIOR APPROACH;  Surgeon: Gean Birchwood, MD;  Location: [facility];  Service: Orthopedics;  Laterality: Right;    REVIEW OF SYSTEMS:  Constitutional: positive for fatigue Eyes: negative Ears, nose, mouth, throat, and face: negative Respiratory: negative Cardiovascular: negative Gastrointestinal: negative Genitourinary:negative Integument/breast: negative Hematologic/lymphatic: negative Musculoskeletal:negative Neurological: negative Behavioral/Psych: negative Endocrine: negative Allergic/Immunologic: negative   PHYSICAL EXAMINATION: General appearance: alert, cooperative, fatigued, and no distress Head: Normocephalic, without obvious abnormality, atraumatic Neck: no adenopathy, no JVD, supple, symmetrical, trachea midline, and thyroid not enlarged, symmetric, no tenderness/mass/nodules Lymph nodes: Cervical, supraclavicular, and axillary nodes normal. Resp: clear to auscultation bilaterally Back: symmetric, no curvature. ROM normal. No CVA tenderness. Cardio: regular rate and rhythm, S1, S2 normal, no murmur, click, rub or gallop GI: soft, non-tender; bowel sounds normal; no  masses,  no organomegaly Extremities: extremities normal, atraumatic, no cyanosis or edema Neurologic: Alert and oriented X 3, normal strength and tone. Normal symmetric reflexes. Normal coordination and gait  ECOG PERFORMANCE STATUS: 1 - Symptomatic but completely ambulatory  Blood pressure 107/78, pulse 70, temperature 98.2 F (36.8 C), temperature source Oral, resp. rate 17, height 5\' 8"  (1.727 m), weight 190 lb 8 oz (86.4 kg), SpO2 100%.  LABORATORY DATA: Lab Results  Component Value Date   WBC 5.3 01/12/2023   HGB 11.6 (L) 01/12/2023   HCT 35.7 (L) 01/12/2023   MCV 74.5 (L) 01/12/2023   PLT 182 01/12/2023      Chemistry      Component Value Date/Time   NA 136 01/12/2023 0941   NA 141 12/14/2020 1053   K 4.2 01/12/2023 0941   CL 103 01/12/2023 0941   CO2 26 01/12/2023 0941   BUN 42 (H) 01/12/2023 0941   BUN 45 (H) 12/14/2020 1053   CREATININE 2.50 (H) 01/12/2023 0941      Component Value Date/Time   CALCIUM 9.3 01/12/2023 0941   ALKPHOS 88 01/12/2023 0941   AST 23 01/12/2023 0941   ALT 19 01/12/2023 0941   BILITOT 0.5 01/12/2023 0941       RADIOGRAPHIC STUDIES: CT  CHEST ABDOMEN PELVIS WO CONTRAST  Result Date: 01/14/2023 CLINICAL DATA:  Metastatic disease evaluation, renal cell carcinoma. Follow-up * Tracking Code: BO * . EXAM: CT CHEST, ABDOMEN AND PELVIS WITHOUT CONTRAST TECHNIQUE: Multidetector CT imaging of the chest, abdomen and pelvis was performed following the standard protocol without IV contrast. RADIATION DOSE REDUCTION: This exam was performed according to the departmental dose-optimization program which includes automated exposure control, adjustment of the mA and/or kV according to patient size and/or use of iterative reconstruction technique. COMPARISON:  Multiple priors including PET-CT Oct 10, 2022. 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 Prior median sternotomy and  CABG. Left chest AICD/pacemaker with unchanged position of the leads. Mitral valve prosthesis. No significant pericardial effusion/thickening. Mediastinum/Nodes: No suspicious thyroid nodule. No pathologically enlarged mediastinal, hilar or axillary lymph nodes. The esophagus is grossly unremarkable. Lungs/Pleura: Scattered tiny bilateral pulmonary nodules are stable over multiple prior examinations for instance a 3 mm subpleural pulmonary nodule in the superior segment of the lingula on image 103/6, compatible with sequela of postinfectious/inflammatory etiology. No new suspicious pulmonary nodules or masses Musculoskeletal: Prior median sternotomy. No aggressive lytic or blastic lesion of bone CT ABDOMEN PELVIS FINDINGS Hepatobiliary: No suspicious hepatic lesion on noncontrast enhanced examination. Cholelithiasis without findings of acute cholecystitis. No biliary ductal dilation. Pancreas: No pancreatic ductal dilation or evidence of acute inflammation. Spleen: No splenomegaly. Adrenals/Urinary Tract: Adrenal glands are unchanged. Prior right nephrectomy with increased size of the soft tissue nodule in the superior aspect of the nephrectomy bed which was hypermetabolic on PET-CT now measures 3.0 x 2.4 cm on image 57/2 previously 2.5 x 1.5 cm. No left-sided hydronephrosis. No contour deforming mass in the left kidney. Urinary bladder is unremarkable for degree of distension. Stomach/Bowel: Stomach is unremarkable for degree of distension. No pathologic dilation of small or large bowel. Normal appendix. Colonic diverticulosis without findings of acute diverticulitis. Vascular/Lymphatic: Aortic atherosclerosis. Smooth IVC contours. No pathologically enlarged abdominal or pelvic lymph nodes. Reproductive: Status post hysterectomy. No adnexal masses. Other: No significant abdominopelvic free fluid. Musculoskeletal: No aggressive lytic or blastic lesion of bone. Right total hip arthroplasty. Multilevel degenerative  changes spine. IMPRESSION: 1. Prior right nephrectomy with increased size of the soft tissue nodule in the superior aspect of the nephrectomy bed, compatible with local recurrence. 2. No new sites of metastatic disease identified on this noncontrast enhanced examination. 3. 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 Suggest continued attention on follow-up oncologic imaging. 4.  Aortic Atherosclerosis (ICD10-I70.0). Electronically Signed   By: Maudry Mayhew M.D.   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 The patient has the following treatment: 1) status post right frontal craniotomy and resection of solitary brain metastasis in March 2021 followed by postoperative SRS at [facility]. 2) Status post right radical nephrectomy on Oct 16, 2019 with the final pathologic stage of pT3a, pN0.  The final pathology showed indeterminant subtype with nuclear grade 3. 3) Status post stereotactic radiotherapy on March 03, 2020 to metastatic brain metastasis when the patient developed 2.0 cm mass in the right frontal lobe consistent with recurrent disease. 4) Status post SRS on February 08, 2021 for another isolated left frontal lobe metastasis. 5) palliative radiotherapy to the hypermetabolic soft  tissue nodule in the right nephrectomy bed under the care of Dr. Mitzi Hansen The patient is currently on observation and she is feeling fine. She had repeat CT scan of the chest, abdomen and pelvis performed recently.  I personally and independently reviewed the scan and discussed the result with the patient today.  The scan showed increase in the size of the soft tissue nodule in the superior aspect of the nephrectomy bed compatible with local recurrence but no new sites  of metastatic disease. I had a lengthy discussion with the patient today about her condition and treatment options. I discussed with the patient proceeding with systemic therapy with immunotherapy versus continuous monitoring and observation for the soft tissue nodule which could be increased in size secondary to inflammatory changes after the palliative radiotherapy. The patient would like to continue on observation for now. I will see her back for follow-up visit in 3 months for evaluation with repeat CT scan of the chest, abdomen and pelvis for restaging of her disease. She was advised to call immediately if she has any other concerning symptoms in the interval.  The patient voices understanding of current disease status and treatment options and is in agreement with the current care plan.  All questions were answered. The patient knows to call the clinic with any problems, questions or concerns. We can certainly see the patient much sooner if necessary.  The total time spent in the appointment was 30 minutes.  Disclaimer: This note was dictated with voice recognition software. Similar sounding words can inadvertently be transcribed and may not be corrected upon review.

## 2023-01-24 NOTE — Progress Notes (Unsigned)
  Electrophysiology Office Follow up Visit Note:    Date:  01/25/2023   ID:  Jacqueline Palmer, DOB Feb 11, 1946, MRN [id number]  PCP:  Marcine Matar, MD  [facility] Cardiologist:  Jodelle Red, MD  [facility] Electrophysiologist:  Colligan Prude, MD    Interval History:    Jacqueline Palmer is a 77 y.o. female who presents for a follow up visit.   Last seen March 04, 2020 for her nonischemic cardiomyopathy and a biventricular ICD.  She has had successful treatment of ventricular tachycardia in the past with ATP.  She has permanent atrial fibrillation on Coumadin.  She has had a prior AV nodal ablation.  She most recently saw Luster Landsberg in the clinic on October 26, 2022.  She was undergoing treatment for renal cell carcinoma with XRT.  At the appoint with [facility] her device is functioning appropriately.  She was taking warfarin for stroke prophylaxis.  She is 100% paced.  Today she is doing well.  She stays active at her facility with physical therapy multiple times per week.  She does exercises on her own and gets out walking around the building intermittently.  She is socially very active and plays multiple games.  She also goes out to lunch sometimes with her resident friends.     Past medical, surgical, social and family history were reviewed.  ROS:   Please see the history of present illness.    All other systems reviewed and are negative.  EKGs/Labs/Other Studies Reviewed:    The following studies were reviewed today:  January 25, 2023 in clinic device interrogation personally reviewed Battery longevity 2.5 years Lead parameters stable 100% LV/RV paced 0% A-fib burden No programming changes made today       Physical Exam:    VS:  BP 110/82   Pulse 70   Ht 5\' 8"  (1.727 m)   Wt 194 lb (88 kg)   SpO2 92%   BMI 29.50 kg/m     Wt Readings from Last 3 Encounters:  01/25/23 194 lb (88 kg)  01/16/23 190 lb 8 oz (86.4 kg)  12/14/22 195 lb (88.5 kg)     GEN:   Well nourished, well developed in no acute distress CARDIAC: RRR, no murmurs, rubs, gallops.  CRT-D pocket well-healed. RESPIRATORY:  Clear to auscultation without rales, wheezing or rhonchi       ASSESSMENT:    1. NICM (nonischemic cardiomyopathy) (HCC)   2. Biventricular implantable cardioverter-defibrillator in situ   3. Permanent atrial fibrillation (HCC)   4. Chronic systolic heart failure (HCC)    PLAN:    In order of problems listed above:   #Nonischemic cardiomyopathy #Chronic systolic heart failure NYHA class II.  Warm dry exam.  Continue GDMT (torsemide, spironolactone, metoprolol, hydralazine)  #CRT-D in situ Device functioning appropriately.  Continue remote monitoring.  #Permanent atrial fibrillation Continue warfarin  Follow-up 1 year with APP.      Signed, Steffanie Dunn, MD, [facility], [facility] 01/25/2023 1:25 PM    Electrophysiology [facility]

## 2023-01-25 ENCOUNTER — Ambulatory Visit: Payer: Medicare Other | Attending: Cardiology | Admitting: Cardiology

## 2023-01-25 ENCOUNTER — Encounter: Payer: Self-pay | Admitting: Cardiology

## 2023-01-25 VITALS — BP 110/82 | HR 70 | Ht 68.0 in | Wt 194.0 lb

## 2023-01-25 DIAGNOSIS — I428 Other cardiomyopathies: Secondary | ICD-10-CM | POA: Diagnosis present

## 2023-01-25 DIAGNOSIS — I5022 Chronic systolic (congestive) heart failure: Secondary | ICD-10-CM | POA: Diagnosis present

## 2023-01-25 DIAGNOSIS — I4821 Permanent atrial fibrillation: Secondary | ICD-10-CM

## 2023-01-25 DIAGNOSIS — Z95810 Presence of automatic (implantable) cardiac defibrillator: Secondary | ICD-10-CM

## 2023-01-25 NOTE — Patient Instructions (Signed)
 Medication Instructions:  Your physician recommends that you continue on your current medications as directed. Please refer to the Current Medication list given to you today.  *If you need a refill on your cardiac medications before your next appointment, please call your pharmacy*  Follow-Up: At [facility], you and your health needs are our priority.  As part of our continuing mission to provide you with exceptional heart care, we have created designated Provider Care Teams.  These Care Teams include your primary Cardiologist (physician) and Advanced Practice Providers (APPs -  Physician Assistants and Nurse Practitioners) who all work together to provide you with the care you need, when you need it.  Your next appointment:   1 year  Provider:   You will see one of the following Advanced Practice Providers on your designated Care Team:   Francis Dowse, Charlott Holler [address]" [place], [place] Sherie Don, NP Canary Brim, NP

## 2023-01-29 ENCOUNTER — Ambulatory Visit
Admission: RE | Admit: 2023-01-29 | Discharge: 2023-01-29 | Disposition: A | Discharge status: Home / Self Care | Payer: Medicare Other | Source: Ambulatory Visit | Attending: Internal Medicine | Admitting: Internal Medicine

## 2023-01-29 DIAGNOSIS — C7931 Secondary malignant neoplasm of brain: Secondary | ICD-10-CM | POA: Insufficient documentation

## 2023-01-29 DIAGNOSIS — C641 Malignant neoplasm of right kidney, except renal pelvis: Secondary | ICD-10-CM | POA: Insufficient documentation

## 2023-01-29 DIAGNOSIS — Z510 Encounter for antineoplastic radiation therapy: Secondary | ICD-10-CM | POA: Insufficient documentation

## 2023-01-29 NOTE — Progress Notes (Signed)
  Radiation Oncology         [phone] ________________________________  Name: Jacqueline Palmer MRN: [id number]  Date of Service: 01/29/2023  DOB: [birth year]  Post Treatment Telephone Note  Diagnosis:  Stage IV renal cell carcinoma of the right kidney with brain metastases (as documented in provider EOT note)   The patient was available for call today.   Symptoms of fatigue have not improved since completing therapy.  Symptoms of skin changes have improved since completing therapy.  Symptoms of nausea or vomiting have improved since completing therapy.   The patient has scheduled follow up with her medical oncologist Dr. Arbutus Ped for ongoing surveillance, and was encouraged to call if she develops concerns or questions regarding radiation.   This concludes the interaction.  [name], LPN

## 2023-02-05 ENCOUNTER — Telehealth: Payer: Self-pay | Admitting: *Deleted

## 2023-02-05 NOTE — Telephone Encounter (Signed)
Attempted to reach patient to reschedule her visit with Dr Barbaraann Cao to after her scheduled MRI date.  MRI isn't until October.  Left message pending call back.

## 2023-02-08 ENCOUNTER — Ambulatory Visit: Payer: Medicare Other | Attending: Cardiovascular Disease | Admitting: *Deleted

## 2023-02-08 ENCOUNTER — Telehealth: Payer: Self-pay | Admitting: *Deleted

## 2023-02-08 DIAGNOSIS — Z5181 Encounter for therapeutic drug level monitoring: Secondary | ICD-10-CM | POA: Insufficient documentation

## 2023-02-08 DIAGNOSIS — I482 Chronic atrial fibrillation, unspecified: Secondary | ICD-10-CM | POA: Insufficient documentation

## 2023-02-08 DIAGNOSIS — Z7901 Long term (current) use of anticoagulants: Secondary | ICD-10-CM | POA: Insufficient documentation

## 2023-02-08 LAB — POCT INR: INR: 3.2 — AB (ref 2.0–3.0)

## 2023-02-08 NOTE — Patient Instructions (Addendum)
Description   Today take 1/2 tablet of warfarin then continue taking Warfarin 1 tablet daily.  Recheck INR in 5 weeks.  Coumadin Clinic [phone]

## 2023-02-08 NOTE — Telephone Encounter (Signed)
PT recert, progress report, & updated therapy plan signed by Dr Barbaraann Cao & faxed to [facility].  Fax confirmation received.

## 2023-02-11 ENCOUNTER — Encounter: Payer: Self-pay | Admitting: Cardiology

## 2023-02-13 ENCOUNTER — Ambulatory Visit: Payer: Medicare Other | Admitting: Internal Medicine

## 2023-02-22 ENCOUNTER — Telehealth: Payer: Self-pay | Admitting: *Deleted

## 2023-02-22 NOTE — Telephone Encounter (Signed)
PT Evaluation & Plan of treatment signed by Dr Barbaraann Cao & faxed to [facility].  Fax confirmation received.

## 2023-03-01 ENCOUNTER — Ambulatory Visit (INDEPENDENT_AMBULATORY_CARE_PROVIDER_SITE_OTHER): Payer: Medicare Other

## 2023-03-01 DIAGNOSIS — I428 Other cardiomyopathies: Secondary | ICD-10-CM | POA: Diagnosis not present

## 2023-03-01 LAB — CUP PACEART REMOTE DEVICE CHECK
Battery Remaining Longevity: 30 mo
Battery Remaining Percentage: 33 %
Brady Statistic RA Percent Paced: 0 %
Brady Statistic RV Percent Paced: 100 %
Date Time Interrogation Session: 20241010000100
HighPow Impedance: 71 Ohm
Implantable Lead Connection Status: 753985
Implantable Lead Connection Status: 753985
Implantable Lead Connection Status: 753985
Implantable Lead Implant Date: 20180423
Implantable Lead Implant Date: 20180423
Implantable Lead Implant Date: 20180423
Implantable Lead Location: 753858
Implantable Lead Location: 753859
Implantable Lead Location: 753860
Implantable Lead Model: 292
Implantable Lead Model: 4672
Implantable Lead Model: 7740
Implantable Lead Serial Number: 431793
Implantable Lead Serial Number: 602825
Implantable Lead Serial Number: 703305
Implantable Pulse Generator Implant Date: 20180423
Lead Channel Impedance Value: 1011 Ohm
Lead Channel Impedance Value: 382 Ohm
Lead Channel Impedance Value: 437 Ohm
Lead Channel Pacing Threshold Amplitude: 0.6 V
Lead Channel Pacing Threshold Amplitude: 0.7 V
Lead Channel Pacing Threshold Pulse Width: 0.4 ms
Lead Channel Pacing Threshold Pulse Width: 0.4 ms
Lead Channel Setting Pacing Amplitude: 1.6 V
Lead Channel Setting Pacing Amplitude: 2 V
Lead Channel Setting Pacing Pulse Width: 0.4 ms
Lead Channel Setting Pacing Pulse Width: 0.4 ms
Lead Channel Setting Sensing Sensitivity: 0.6 mV
Lead Channel Setting Sensing Sensitivity: 1 mV
Pulse Gen Serial Number: 174046
Zone Setting Status: 755011

## 2023-03-05 ENCOUNTER — Other Ambulatory Visit: Payer: Self-pay | Admitting: Cardiology

## 2023-03-05 DIAGNOSIS — I482 Chronic atrial fibrillation, unspecified: Secondary | ICD-10-CM

## 2023-03-06 NOTE — Telephone Encounter (Signed)
Warfarin 5mg  refill Afib Last INR 02/08/23 Last OV 01/25/23

## 2023-03-08 ENCOUNTER — Ambulatory Visit (HOSPITAL_COMMUNITY)
Admission: RE | Admit: 2023-03-08 | Discharge: 2023-03-08 | Disposition: A | Discharge status: Home / Self Care | Payer: Medicare Other | Source: Ambulatory Visit | Attending: Internal Medicine | Admitting: Internal Medicine

## 2023-03-08 DIAGNOSIS — C7931 Secondary malignant neoplasm of brain: Secondary | ICD-10-CM | POA: Insufficient documentation

## 2023-03-08 MED ORDER — GADOBUTROL 1 MMOL/ML IV SOLN
9.0000 mL | Freq: Once | INTRAVENOUS | Status: AC | PRN
  Administered 2023-03-08: 9 mL via INTRAVENOUS

## 2023-03-14 NOTE — Progress Notes (Signed)
Remote ICD transmission.   

## 2023-03-15 ENCOUNTER — Inpatient Hospital Stay: Payer: Medicare Other | Attending: Internal Medicine | Admitting: Internal Medicine

## 2023-03-15 ENCOUNTER — Ambulatory Visit: Payer: Medicare Other | Attending: Cardiology

## 2023-03-15 VITALS — BP 127/90 | HR 70 | Temp 97.5°F | Resp 18 | Wt 191.9 lb

## 2023-03-15 DIAGNOSIS — C7931 Secondary malignant neoplasm of brain: Secondary | ICD-10-CM | POA: Diagnosis present

## 2023-03-15 DIAGNOSIS — Z7901 Long term (current) use of anticoagulants: Secondary | ICD-10-CM | POA: Insufficient documentation

## 2023-03-15 DIAGNOSIS — C649 Malignant neoplasm of unspecified kidney, except renal pelvis: Secondary | ICD-10-CM | POA: Diagnosis not present

## 2023-03-15 DIAGNOSIS — Z87891 Personal history of nicotine dependence: Secondary | ICD-10-CM | POA: Insufficient documentation

## 2023-03-15 DIAGNOSIS — I482 Chronic atrial fibrillation, unspecified: Secondary | ICD-10-CM | POA: Insufficient documentation

## 2023-03-15 LAB — POCT INR: INR: 3.4 — AB (ref 2.0–3.0)

## 2023-03-15 NOTE — Patient Instructions (Signed)
Description   Today take 1/2 tablet of warfarin then START taking Warfarin 1 tablet daily except 1/2 tablet on Sundays.  Recheck INR in 3 weeks.  Coumadin Clinic [phone]

## 2023-03-15 NOTE — Progress Notes (Signed)
[facility] [address]  [place], [place] [address] [phone]   Interval Evaluation  Date of Service: 03/15/23 Patient Name: Jacqueline Palmer Patient MRN: [id number] Patient DOB: [birth year] Provider: Henreitta Leber, MD  Identifying Statement:  Jacqueline Palmer is a 77 y.o. female with Malignant neoplasm metastatic to brain [facility]) [C79.31]   Primary Cancer: Renal Cell Carcinoma, Stage IV  Oncologic History: 07/30/19: Right frontal craniotomy, resection of solitary brain metastasis at [facility] 10/06/19: Post-operative SRS at [facility] (Dr. Adela Lank) 03/09/20: Salvage SRS to R frontal+LMD 27/3x 02/08/21: Salvage SRS to L frontal Jacqueline Palmer)  Interval History:  Jacqueline Palmer presents today for follow up after recent MRI brain.  No significant clinical changes today. No recent seizures, continues to take Keppra 500mg  twice per day.  Continues to be active mentally and engaging socially.  H+P (02/24/20) Patient presents to review recent changes on brain MRI.  She initially presented in March 2021 with a seizure, described as "left side clenching up, lost awareness for a few minutes".  CNS imaging demonstrated an enhancing right frontal mass, c/w likely metastasis from renal cell carcinoma.  Craniotomy was performed at [facility] and followed with surgical bed radiosurgery at [facility].  She did have one recurrence of seizure activity in late August, at which time she was restarted on Keppra.  Unclear on why this was discontinued.  Recent MRI demonstrated some change and she presents today for treatment recommendations.  No neurologic complaints aside from seizures.  Medications: Current Outpatient Medications on File Prior to Visit  Medication Sig Dispense Refill   acetaminophen (TYLENOL) 500 MG tablet Take 1,000 mg by mouth daily with breakfast.     colchicine 0.6 MG tablet TAKE ONE TABLET BY MOUTH DAILY AT BEDTIME 90 tablet 3   hydrALAZINE (APRESOLINE) 10 MG tablet  Take 1 tablet (10 mg total) by mouth 3 (three) times daily. 270 tablet 4   levETIRAcetam (KEPPRA) 500 MG tablet Take 1 tablet (500 mg total) by mouth 2 (two) times daily. 180 tablet 3   levothyroxine (SYNTHROID) 25 MCG tablet TAKE 1 TABLET BY MOUTH ONCE DAILY IN THE MORNING 30 TO 60 MINUTES BEFORE BREAKFAST ON AN EMPTY STOMACH WITH A FULL GLASS OF WATER 90 tablet 4   metoprolol succinate (TOPROL-XL) 25 MG 24 hr tablet Take 1 tablet (25 mg total) by mouth daily. 90 tablet 4   nitroGLYCERIN (NITROSTAT) 0.4 MG SL tablet Place 1 tablet (0.4 mg total) under the tongue every 5 (five) minutes as needed for chest pain. 25 tablet 3   OVER THE COUNTER MEDICATION Apply 1 application topically 2 (two) times daily. Pain cream in groin area     polyethylene glycol (MIRALAX / GLYCOLAX) 17 g packet Take 17 g by mouth daily as needed for mild constipation.     pravastatin (PRAVACHOL) 40 MG tablet Take 1 tablet (40 mg total) by mouth daily. 90 tablet 4   spironolactone (ALDACTONE) 25 MG tablet Take 1 tablet (25 mg total) by mouth daily. Please schedule appointment with Dr. Cristal Deer for refills. 90 tablet 4   torsemide (DEMADEX) 20 MG tablet TAKE 2 TABLETS BY MOUTH EVERY MORNING AND 1 TABLET EVERY EVENING 270 tablet 4   warfarin (COUMADIN) 5 MG tablet TAKE ONE TABLET BY MOUTH DAILY IN THE EVENING OR AS DIRECTED BY THE COUMADIN CLINIC 100 tablet 1   No current facility-administered medications on file prior to visit.    Allergies:  Allergies  Allergen Reactions  Avocado Shortness Of Breath    Before 2007   Banana Anaphylaxis and Swelling   Plantain Anaphylaxis and Swelling   Ace Inhibitors Cough   Codeine Other (See Comments)    Nausea and Feels Jittery   Prednisone Swelling    Of face and lower extremities   Tape Hives    Tape  Does better with paper tape    Latex Rash   Pharmabase Cosmetic [[place]] Swelling and Rash    Some Soap, perfumes,detergent   Past Medical History:  Past Medical History:   Diagnosis Date   Acute kidney injury (HCC)    Arthritis    Atrial fibrillation (HCC)    Cancer (HCC)    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   Cardiomyopathy (HCC)    CHF (congestive heart failure) (HCC)    Chronic a-fib (HCC) 08/14/2016   Chronic anticoagulation 08/14/2016   Coronary artery disease    4v CABG 6/17   Graves disease    Hypertension    Hypothyroidism    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   SN [id number] Model [id number] [From patient's surgical info card]   Presence of permanent cardiac pacemaker    Seizures (HCC)    Thrombus 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 myocardial infarction    Thyroid disease    Past Surgical History:  Past Surgical History:  Procedure Laterality Date   ABDOMINAL HYSTERECTOMY     CORONARY ARTERY BYPASS GRAFT     CRANIOTOMY Right 07/30/2019   Right frontal   NEPHRECTOMY Right 10/16/2019   TOTAL HIP ARTHROPLASTY Right 07/11/2021   Procedure: RIGHT TOTAL HIP ARTHROPLASTY ANTERIOR APPROACH;  Surgeon: Gean Birchwood, MD;  Location: [facility]S;  Service: Orthopedics;  Laterality: Right;   Social History:  Social History   Socioeconomic History   Marital status: Widowed    Spouse name: Not on file   Number of children: 0   Years of education: Doctorate   Highest education level: Doctorate  Occupational History   Occupation: Retired Orthoptist  Tobacco Use   Smoking status: Former    Current packs/day: 0.00    Types: Cigarettes    Quit date: 07/30/2014    Years since quitting: 8.6   Smokeless tobacco: Never  Vaping Use   Vaping status: Never Used  Substance and Sexual Activity   Alcohol use: Not Currently    Comment: 1 glass wine twice weekly   Drug use: No   Sexual activity: Not on file  Other Topics Concern   Not on file  Social History Narrative   Right handed   Coffee daily/tea qod   Social Determinants of Health   Financial Resource Strain: Medium Risk (12/11/2022)   Overall Financial Resource  Strain (CARDIA)    Difficulty of Paying Living Expenses: Somewhat hard  Food Insecurity: Food Insecurity Present (12/11/2022)   Hunger Vital Sign    Worried About Running Out of Food in the Last Year: Sometimes true    Ran Out of Food in the Last Year: Never true  Transportation Needs: No Transportation Needs (12/11/2022)   PRAPARE - Administrator, Civil Service (Medical): No    Lack of Transportation (Non-Medical): No  Physical Activity: Insufficiently Active (12/11/2022)   Exercise Vital Sign    Days of Exercise per Palmer: 3 days    Minutes of Exercise per Session: 30 min  Stress: Stress Concern Present (12/11/2022)   Jacqueline Palmer of Occupational Health - Occupational Stress Questionnaire  Feeling of Stress : To some extent  Social Connections: Moderately Integrated (12/11/2022)   Social Connection and Isolation Panel [NHANES]    Frequency of Communication with Friends and Family: More than three times a Palmer    Frequency of Social Gatherings with Friends and Family: More than three times a Palmer    Attends Religious Services: More than 4 times per year    Active Member of [employer] or Organizations: Yes    Attends Banker Meetings: More than 4 times per year    Marital Status: Widowed  Intimate Partner Violence: Not At Risk (10/19/2022)   Humiliation, Afraid, Rape, and Kick questionnaire    Fear of Current or Ex-Partner: No    Emotionally Abused: No    Physically Abused: No    Sexually Abused: No   Family History:  Family History  Problem Relation Age of Onset   Heart attack Mother    Heart attack Father    Heart attack Sister    Breast cancer Neg Hx     Review of Systems: Constitutional: Doesn't report fevers, chills or abnormal weight loss Eyes: Doesn't report blurriness of vision Ears, nose, mouth, throat, and face: Doesn't report sore throat Respiratory: Doesn't report cough, dyspnea or wheezes Cardiovascular: Doesn't report palpitation, chest  discomfort  Gastrointestinal:  Doesn't report nausea, constipation, diarrhea GU: Doesn't report incontinence Skin: Doesn't report skin rashes Neurological: Per HPI Musculoskeletal: Doesn't report joint pain Behavioral/Psych: Doesn't report anxiety  Physical Exam:    01/25/2023   11:28 AM 01/16/2023    1:57 PM 12/14/2022    1:46 PM  Vitals with BMI  Height 5\' 8"  5\' 8"  5\' 8"   Weight 194 lbs 190 lbs 8 oz 195 lbs  BMI 29.5 28.97 29.66  Systolic 110 107 409  Diastolic 82 78 74  Pulse 70 70 70     KPS: 80. General: Alert, cooperative, pleasant, in no acute distress Head: Normal EENT: No conjunctival injection or scleral icterus.  Lungs: Resp effort normal Cardiac: Regular rate Abdomen: Non-distended abdomen Skin: No rashes cyanosis or petechiae. Extremities: No clubbing or edema  Neurologic Exam: Mental Status: Awake, alert, attentive to examiner. Oriented to self and environment. Language is fluent with intact comprehension.  Cranial Nerves: Visual acuity is grossly normal. Visual fields are full. Extra-ocular movements intact. No ptosis. Face is symmetric Motor: Tone and bulk are normal. Power is full in both arms and legs. Reflexes are symmetric, no pathologic reflexes present.  Sensory: Intact to light touch Gait: Orthopedic limitations  Labs: I have reviewed the data as listed    Component Value Date/Time   NA 136 01/12/2023 0941   NA 141 12/14/2020 1053   K 4.2 01/12/2023 0941   CL 103 01/12/2023 0941   CO2 26 01/12/2023 0941   GLUCOSE 98 01/12/2023 0941   BUN 42 (H) 01/12/2023 0941   BUN 45 (H) 12/14/2020 1053   CREATININE 2.50 (H) 01/12/2023 0941   CALCIUM 9.3 01/12/2023 0941   PROT 7.3 01/12/2023 0941   PROT 7.7 03/28/2022 1157   ALBUMIN 4.1 01/12/2023 0941   ALBUMIN 4.8 03/28/2022 1157   AST 23 01/12/2023 0941   ALT 19 01/12/2023 0941   ALKPHOS 88 01/12/2023 0941   BILITOT 0.5 01/12/2023 0941   GFRNONAA 19 (L) 01/12/2023 0941   GFRAA 30 (L) 01/20/2020  1433   Lab Results  Component Value Date   WBC 5.3 01/12/2023   NEUTROABS 2.8 01/12/2023   HGB 11.6 (L) 01/12/2023   HCT  35.7 (L) 01/12/2023   MCV 74.5 (L) 01/12/2023   PLT 182 01/12/2023    Imaging:  [facility] Clinician Interpretation: I have personally reviewed the CNS images as listed.  My interpretation, in the context of the patient's clinical presentation, is stable disease  MR BRAIN W WO CONTRAST  Result Date: 03/08/2023 CLINICAL DATA:  MR head without and with contrast 10/05/2022 EXAM: MRI HEAD WITHOUT AND WITH CONTRAST TECHNIQUE: Multiplanar, multiecho pulse sequences of the brain and surrounding structures were obtained without and with intravenous contrast. CONTRAST:  9mL GADAVIST GADOBUTROL 1 MMOL/ML IV SOLN COMPARISON:  MR head without and with contrast 10/05/2022 and 05/25/2022 FINDINGS: Brain: A focal enhancing lesion in the posterior right frontal lobe seen on axial image 113 of series 37 measures 7 mm on the axial images, stable over the last 2 exams. More superiorly a right frontal resection cavity is noted. Linear enhancement within the central aspect of the cavity is stable. No new areas of pathologic enhancement or edema are present. Remote blood products are present at the operative site. No other foci of susceptibility are present. Dilated perivascular spaces are present within the basal ganglia bilaterally. Remote lacunar infarcts are present within the thalami bilaterally. The brainstem is within limits. Remote lacunar infarcts are again noted within the cerebellar hemispheres bilaterally. Vascular: Flow is present in the major intracranial arteries. Skull and upper cervical spine: Multiple enhancing lesions are present in the skull. The largest within the left parietal skull and has increased from 5 mm to 7 mm. Other smaller lesions are stable. The craniocervical junction is within normal limits. The upper cervical spine is unremarkable. Sinuses/Orbits: The paranasal sinuses  and mastoid air cells are clear. The globes and orbits are within normal limits. IMPRESSION: 1. Stable appearance of 7 mm enhancing lesion in the posterior right frontal lobe. This likely represents a small focus of metastatic disease. 2. Stable appearance of right frontal resection cavity. 3. No new areas of pathologic enhancement or edema. 4. Multiple enhancing lesions in the skull. The largest within the left parietal skull and has increased from 5 mm to 7 mm. Other smaller lesions are stable. 5. 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 Electronically Signed   By: Marin Roberts M.D.   On: 03/08/2023 17:05   CUP PACEART REMOTE DEVICE CHECK  Result Date: 03/01/2023 Scheduled remote reviewed. Normal device function.  Permanent AF/AFL, controlled rates, Warfarin per PA report Next remote 91 days LA, CVRSMRI Protection last programmed on Oct 05, 2022. Beeper is OFF.    Assessment/Plan Malignant neoplasm metastatic to brain [facility]) [C79.31]  Jacqueline Palmer is clinically and radiographically stable today.  No new or progressive changes.  Continues on observation for renal cell carcinoma with Dr. Arbutus Ped.   Recommended continuing Keppra at 500mg  BID.    We appreciate the opportunity to participate in the care of Jacqueline Palmer.    We ask that Jacqueline Palmer return to clinic in 6 months following next brain MRI, or sooner as needed.  All questions were answered. The patient knows to call the clinic with any problems, questions or concerns. No barriers to learning were detected.  The total time spent in the encounter was 30 minutes and more than 50% was on counseling and review of test results   Henreitta Leber, MD Medical Director of Neuro-Oncology [facility] 03/15/23 12:42 PM

## 2023-04-05 ENCOUNTER — Ambulatory Visit (HOSPITAL_COMMUNITY)
Admission: RE | Admit: 2023-04-05 | Discharge: 2023-04-05 | Disposition: A | Discharge status: Home / Self Care | Payer: Medicare Other | Source: Ambulatory Visit | Attending: Internal Medicine | Admitting: Internal Medicine

## 2023-04-05 ENCOUNTER — Ambulatory Visit: Payer: Medicare Other

## 2023-04-05 DIAGNOSIS — C641 Malignant neoplasm of right kidney, except renal pelvis: Secondary | ICD-10-CM | POA: Diagnosis present

## 2023-04-05 DIAGNOSIS — I482 Chronic atrial fibrillation, unspecified: Secondary | ICD-10-CM | POA: Diagnosis present

## 2023-04-05 DIAGNOSIS — Z7901 Long term (current) use of anticoagulants: Secondary | ICD-10-CM

## 2023-04-05 LAB — POCT INR: INR: 3 (ref 2.0–3.0)

## 2023-04-05 NOTE — Patient Instructions (Signed)
Description   Continue taking Warfarin 1 tablet daily except 1/2 tablet on Sundays.  Recheck INR in 4 weeks.  Coumadin Clinic [phone]

## 2023-04-09 ENCOUNTER — Inpatient Hospital Stay: Payer: Medicare Other | Attending: Internal Medicine

## 2023-04-09 DIAGNOSIS — C649 Malignant neoplasm of unspecified kidney, except renal pelvis: Secondary | ICD-10-CM | POA: Insufficient documentation

## 2023-04-09 DIAGNOSIS — C7931 Secondary malignant neoplasm of brain: Secondary | ICD-10-CM | POA: Insufficient documentation

## 2023-04-09 DIAGNOSIS — C641 Malignant neoplasm of right kidney, except renal pelvis: Secondary | ICD-10-CM

## 2023-04-09 LAB — CBC WITH DIFFERENTIAL (CANCER CENTER ONLY)
Abs Immature Granulocytes: 0.01 10*3/uL (ref 0.00–0.07)
Basophils Absolute: 0 10*3/uL (ref 0.0–0.1)
Basophils Relative: 1 %
Eosinophils Absolute: 0.3 10*3/uL (ref 0.0–0.5)
Eosinophils Relative: 5 %
HCT: 37.8 % (ref 36.0–46.0)
Hemoglobin: 12.2 g/dL (ref 12.0–15.0)
Immature Granulocytes: 0 %
Lymphocytes Relative: 24 %
Lymphs Abs: 1.3 10*3/uL (ref 0.7–4.0)
MCH: 24.2 pg — ABNORMAL LOW (ref 26.0–34.0)
MCHC: 32.3 g/dL (ref 30.0–36.0)
MCV: 74.9 fL — ABNORMAL LOW (ref 80.0–100.0)
Monocytes Absolute: 1 10*3/uL (ref 0.1–1.0)
Monocytes Relative: 18 %
Neutro Abs: 2.8 10*3/uL (ref 1.7–7.7)
Neutrophils Relative %: 52 %
Platelet Count: 209 10*3/uL (ref 150–400)
RBC: 5.05 MIL/uL (ref 3.87–5.11)
RDW: 14.3 % (ref 11.5–15.5)
WBC Count: 5.4 10*3/uL (ref 4.0–10.5)
nRBC: 0 % (ref 0.0–0.2)

## 2023-04-09 LAB — CMP (CANCER CENTER ONLY)
ALT: 18 U/L (ref 0–44)
AST: 25 U/L (ref 15–41)
Albumin: 4.2 g/dL (ref 3.5–5.0)
Alkaline Phosphatase: 97 U/L (ref 38–126)
Anion gap: 7 (ref 5–15)
BUN: 48 mg/dL — ABNORMAL HIGH (ref 8–23)
CO2: 26 mmol/L (ref 22–32)
Calcium: 9.7 mg/dL (ref 8.9–10.3)
Chloride: 103 mmol/L (ref 98–111)
Creatinine: 2.61 mg/dL — ABNORMAL HIGH (ref 0.44–1.00)
GFR, Estimated: 18 mL/min — ABNORMAL LOW (ref >60)
Glucose, Bld: 85 mg/dL (ref 70–99)
Potassium: 4.6 mmol/L (ref 3.5–5.1)
Sodium: 136 mmol/L (ref 135–145)
Total Bilirubin: 0.7 mg/dL (ref <1.2)
Total Protein: 7.6 g/dL (ref 6.5–8.1)

## 2023-04-09 LAB — LACTATE DEHYDROGENASE: LDH: 275 U/L — ABNORMAL HIGH (ref 98–192)

## 2023-04-16 ENCOUNTER — Ambulatory Visit: Payer: Medicare Other | Admitting: Internal Medicine

## 2023-04-24 ENCOUNTER — Ambulatory Visit: Payer: Medicare Other | Attending: Internal Medicine

## 2023-04-24 VITALS — Ht 68.0 in | Wt 190.0 lb

## 2023-04-24 DIAGNOSIS — Z0000 Encounter for general adult medical examination without abnormal findings: Secondary | ICD-10-CM

## 2023-04-24 NOTE — Progress Notes (Signed)
Subjective:   Jacqueline Palmer is a 77 y.o. female who presents for Medicare Annual (Subsequent) preventive examination.  Visit Complete: Virtual I connected with  [name] on 04/24/23 by a audio enabled telemedicine application and verified that I am speaking with the correct person using two identifiers.  Patient Location: Home  Provider Location: Home Office  I discussed the limitations of evaluation and management by telemedicine. The patient expressed understanding and agreed to proceed.  Vital Signs: Because this visit was a virtual/telehealth visit, some criteria may be missing or patient reported. Any vitals not documented were not able to be obtained and vitals that have been documented are patient reported.  Patient Medicare AWV questionnaire was completed by the patient on 04/20/2023; I have confirmed that all information answered by patient is correct and no changes since this date.  Cardiac Risk Factors include: advanced age (>88men, >16 women);dyslipidemia;hypertension     Objective:    Today's Vitals   04/24/23 1422 04/24/23 1443  Weight: 190 lb (86.2 kg)   Height: 5\' 8"  (1.727 m)   PainSc: 0-No pain 0-No pain   Body mass index is 28.89 kg/m.     04/24/2023    2:26 PM 10/19/2022    9:20 AM 10/10/2022   11:51 AM 04/21/2022   11:08 AM 08/30/2021    8:42 AM 07/11/2021    3:51 PM 07/01/2021    1:07 PM  Advanced Directives  Does Patient Have a Medical Advance Directive? Yes No Yes Yes  Yes Yes  Type of Forensic scientist of [place];Living will Healthcare Power of [place];Living will Healthcare Power of [place];Living will Healthcare Power of Attorney Living will  Does patient want to make changes to medical advance directive? No - Patient declined Yes (ED - Information included in AVS)    No - Patient declined   Copy of Healthcare Power of Attorney in Chart? Yes - validated most recent copy scanned in chart (See row  information)    No - copy requested      Current Medications (verified) Outpatient Encounter Medications as of 04/24/2023  Medication Sig   acetaminophen (TYLENOL) 500 MG tablet Take 1,000 mg by mouth daily with breakfast.   colchicine 0.6 MG tablet TAKE ONE TABLET BY MOUTH DAILY AT BEDTIME   hydrALAZINE (APRESOLINE) 10 MG tablet Take 1 tablet (10 mg total) by mouth 3 (three) times daily.   levETIRAcetam (KEPPRA) 500 MG tablet Take 1 tablet (500 mg total) by mouth 2 (two) times daily.   levothyroxine (SYNTHROID) 25 MCG tablet TAKE 1 TABLET BY MOUTH ONCE DAILY IN THE MORNING 30 TO 60 MINUTES BEFORE BREAKFAST ON AN EMPTY STOMACH WITH A FULL GLASS OF WATER   metoprolol succinate (TOPROL-XL) 25 MG 24 hr tablet Take 1 tablet (25 mg total) by mouth daily.   nitroGLYCERIN (NITROSTAT) 0.4 MG SL tablet Place 1 tablet (0.4 mg total) under the tongue every 5 (five) minutes as needed for chest pain.   OVER THE COUNTER MEDICATION Apply 1 application topically 2 (two) times daily. Pain cream in groin area   polyethylene glycol (MIRALAX / GLYCOLAX) 17 g packet Take 17 g by mouth daily as needed for mild constipation.   pravastatin (PRAVACHOL) 40 MG tablet Take 1 tablet (40 mg total) by mouth daily.   spironolactone (ALDACTONE) 25 MG tablet Take 1 tablet (25 mg total) by mouth daily. Please schedule appointment with Dr. Cristal Deer for refills.   torsemide (DEMADEX) 20 MG  tablet TAKE 2 TABLETS BY MOUTH EVERY MORNING AND 1 TABLET EVERY EVENING   warfarin (COUMADIN) 5 MG tablet TAKE ONE TABLET BY MOUTH DAILY IN THE EVENING OR AS DIRECTED BY THE COUMADIN CLINIC   No facility-administered encounter medications on file as of 04/24/2023.    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   SN [id number] Model [id number] [From patient's surgical info card]   Presence of permanent cardiac pacemaker    Seizures (HCC)    Thrombus 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 myocardial infarction    Thyroid disease    Past Surgical History:  Procedure Laterality Date   ABDOMINAL HYSTERECTOMY     CORONARY ARTERY BYPASS GRAFT     CRANIOTOMY Right 07/30/2019   Right frontal   NEPHRECTOMY Right 10/16/2019   TOTAL HIP ARTHROPLASTY Right 07/11/2021   Procedure: RIGHT TOTAL HIP ARTHROPLASTY ANTERIOR APPROACH;  Surgeon: Gean Birchwood, MD;  Location: [facility];  Service: Orthopedics;  Laterality: Right;   Family History  Problem Relation Age of Onset   Heart attack Mother    Heart attack Father    Heart attack Sister    Breast cancer Neg Hx    Social History   Socioeconomic History   Marital status: Widowed    Spouse name: Not on file   Number of children: 0   Years of education: Doctorate   Highest education level: Doctorate  Occupational History   Occupation: Retired Orthoptist  Tobacco Use   Smoking status: Former    Current packs/day: 0.00    Types: Cigarettes    Quit date: 07/30/2014    Years since quitting: 8.7   Smokeless tobacco: Never  Vaping Use   Vaping status: Never Used  Substance and Sexual Activity   Alcohol use: Not Currently    Comment: 1 glass wine twice weekly   Drug use: No   Sexual activity: Not on file  Other Topics Concern   Not on file  Social History Narrative   Right handed   Coffee daily/tea qod   Social Determinants of Health   Financial Resource Strain: Low Risk  (04/24/2023)   Overall Financial Resource Strain  (CARDIA)    Difficulty of Paying Living Expenses: Not very hard  Food Insecurity: No Food Insecurity (04/24/2023)   Hunger Vital Sign    Worried About Running Out of Food in the Last Year: Never true    Ran Out of Food in the Last Year: Never true  Transportation Needs: Unknown (04/24/2023)   PRAPARE - Administrator, Civil Service (Medical): Patient declined    Lack of Transportation (Non-Medical): No  Physical Activity: Insufficiently Active (04/24/2023)   Exercise Vital Sign    Days of Exercise per Week: 2 days    Minutes of Exercise per Session: 20 min  Stress: No Stress Concern Present (04/24/2023)   [employer] of Occupational  Health - Occupational Stress Questionnaire    Feeling of Stress : Only a little  Social Connections: Moderately Integrated (04/24/2023)   Social Connection and Isolation Panel [NHANES]    Frequency of Communication with Friends and Family: Once a week    Frequency of Social Gatherings with Friends and Family: More than three times a week    Attends Religious Services: More than 4 times per year    Active Member of [employer] or Organizations: Yes    Attends Banker Meetings: More than 4 times per year    Marital Status: Widowed    Tobacco Counseling Counseling given: Not Answered   Clinical Intake:  Pre-visit preparation completed: Yes  Pain : No/denies pain Pain Score: 0-No pain     BMI - recorded: 28.89 Nutritional Status: BMI 25 -29 Overweight Nutritional Risks: None Diabetes: No  How often do you need to have someone help you when you read instructions, pamphlets, or other written materials from your doctor or pharmacy?: 1 - Never What is the last grade level you completed in school?: College  Interpreter Needed?: No  Information entered by :: Christopherjohn Schiele N. Morrie Daywalt, LPN.   Activities of Daily Living    04/24/2023    2:34 PM 04/20/2023   10:12 PM  In your present state of health, do you have any difficulty  performing the following activities:  Hearing? 0 0  Vision? 0 0  Difficulty concentrating or making decisions? 0 0  Walking or climbing stairs? 1 1  Dressing or bathing? 0 0  Doing errands, shopping? 1 1  Preparing Food and eating ? N N  Using the Toilet? N N  In the past six months, have you accidently leaked urine? N N  Do you have problems with loss of bowel control? N N  Managing your Medications? N N  Managing your Finances? N N  Housekeeping or managing your Housekeeping? N N    Patient Care Team: Marcine Matar, MD as PCP - General (Internal Medicine) Riggsbee Prude, MD as PCP - Electrophysiology (Cardiology) Jodelle Red, MD as PCP - Cardiology (Cardiology)  Indicate any recent Medical Services you may have received from other than [facility] providers in the past year (date may be approximate).     Assessment:   This is a routine wellness examination for [place].  Hearing/Vision screen Hearing Screening - Comments:: Denies hearing difficulties; No hearing aids.  Vision Screening - Comments:: Wears rx glasses - up to date with routine eye exams with     Goals Addressed             This Visit's Progress    Client understands the importance of follow-up with providers by attending scheduled visits        Depression Screen    04/24/2023    2:38 PM 12/14/2022    2:23 PM 08/14/2022    2:09 PM 08/14/2022    2:05 PM 04/21/2022   11:09 AM 03/28/2022   11:18 AM 08/18/2021   11:18 AM  PHQ 2/9 Scores  PHQ - 2 Score 0 0 0 0 0 0 0  PHQ- 9 Score 0  0 0  3     Fall Risk    04/24/2023    2:33 PM 04/20/2023   10:12 PM 12/14/2022    1:45 PM 08/14/2022    1:59 PM 04/21/2022   11:08 AM  Fall Risk   Falls in the past year? 0 0 0 0 0  Number falls in  past yr: 0  0 0 0  Injury with Fall? 0  0 0 0  Risk for fall due to : No Fall Risks  No Fall Risks No Fall Risks   Follow up Falls prevention discussed        MEDICARE RISK AT HOME: Medicare Risk at Home Any  stairs in or around the home?: Yes If so, are there any without handrails?: No Home free of loose throw rugs in walkways, pet beds, electrical cords, etc?: Yes Adequate lighting in your home to reduce risk of falls?: Yes Life alert?: Yes Use of a cane, walker or w/c?: Yes Grab bars in the bathroom?: Yes Shower chair or bench in shower?: Yes Elevated toilet seat or a handicapped toilet?: Yes  TIMED UP AND GO:  Was the test performed?  No    Cognitive Function:    04/24/2023    2:37 PM 04/21/2022   11:09 AM  MMSE - Mini Mental State Exam  Not completed: Unable to complete   Orientation to time  5  Orientation to Place  5  Registration  3  Attention/ Calculation  5  Recall  3  Language- name 2 objects  2  Language- repeat  1  Language- follow 3 step command  3  Language- read & follow direction  1  Write a sentence  1  Copy design  1  Total score  30        04/24/2023    2:34 PM 04/21/2022   11:09 AM 01/29/2021    3:28 PM  6CIT Screen  What Year? 0 points 0 points 0 points  What month? 0 points 0 points 0 points  What time? 0 points 0 points 0 points  Count back from 20 0 points 0 points 0 points  Months in reverse 0 points 0 points 0 points  Repeat phrase 0 points 0 points   Total Score 0 points 0 points     Immunizations Immunization History  Administered Date(s) Administered   Influenza, High Dose Seasonal PF 02/16/2016, 04/20/2017   Influenza,inj,Quad PF,6+ Mos 02/18/2022   Influenza-Unspecified 03/04/2021   PFIZER Comirnaty(Gray Top)Covid-19 Tri-Sucrose Vaccine 07/03/2019, 07/24/2019   PFIZER(Purple Top)SARS-COV-2 Vaccination 03/29/2020   Pfizer(Comirnaty)Fall Seasonal Vaccine 12 years and older 03/17/2022   Pneumococcal Conjugate (Pcv15) 04/19/2021   Pneumococcal Polysaccharide-23 08/11/2016   Tdap 12/14/2020   Zoster Recombinant(Shingrix) 08/05/2022    TDAP status: Up to date  Flu Vaccine status: Due, Education has been provided regarding the  importance of this vaccine. Advised may receive this vaccine at local pharmacy or Health Dept. Aware to provide a copy of the vaccination record if obtained from local pharmacy or Health Dept. Verbalized acceptance and understanding.  Pneumococcal vaccine status: Up to date  Covid-19 vaccine status: Information provided on how to obtain vaccines.   Qualifies for Shingles Vaccine? Yes   Zostavax completed No   Shingrix Completed?: No.    Education has been provided regarding the importance of this vaccine. Patient has been advised to call insurance company to determine out of pocket expense if they have not yet received this vaccine. Advised may also receive vaccine at local pharmacy or Health Dept. Verbalized acceptance and understanding.  Screening Tests Health Maintenance  Topic Date Due   Hepatitis C Screening  Never done   Zoster Vaccines- Shingrix (2 of 2) 09/30/2022   INFLUENZA VACCINE  12/21/2022   COVID-19 Vaccine (5 - 2023-24 season) 01/21/2023   Colonoscopy  09/04/2023 (Originally 08/04/2022)   Medicare  Annual Wellness (AWV)  04/23/2024   DTaP/Tdap/Td (2 - Td or Tdap) 12/15/2030   Pneumonia Vaccine 74+ Years old  Completed   DEXA SCAN  Completed   HPV VACCINES  Aged Out    Health Maintenance  Health Maintenance Due  Topic Date Due   Hepatitis C Screening  Never done   Zoster Vaccines- Shingrix (2 of 2) 09/30/2022   INFLUENZA VACCINE  12/21/2022   COVID-19 Vaccine (5 - 2023-24 season) 01/21/2023    Colorectal cancer screening: Type of screening: Colonoscopy. Completed 08/03/2017. Repeat every 5 years-postponed until 09/04/2023  Mammogram status: Completed 08/22/2022. Repeat every year  Bone Density status: Completed 07/04/2018. Results reflect: Bone density results: OSTEOPENIA. Repeat every 2-3 years.  Lung Cancer Screening: (Low Dose CT Chest recommended if Age 54-80 years, 20 pack-year currently smoking OR have quit w/in 15years.) does not qualify.   Lung Cancer  Screening Referral: no  Additional Screening:  Hepatitis C Screening: does qualify; Completed: no  Vision Screening: Recommended annual ophthalmology exams for early detection of glaucoma and other disorders of the eye. Is the patient up to date with their annual eye exam?  Yes  Who is the provider or what is the name of the office in which the patient attends annual eye exams? [facility] If pt is not established with a provider, would they like to be referred to a provider to establish care? No .   Dental Screening: Recommended annual dental exams for proper oral hygiene  Diabetic Foot Exam: N/A  Community Resource Referral / Chronic Care Management: CRR required this visit?  No   CCM required this visit?  No     Plan:     I have personally reviewed and noted the following in the patient's chart:   Medical and social history Use of alcohol, tobacco or illicit drugs  Current medications and supplements including opioid prescriptions. Patient is not currently taking opioid prescriptions. Functional ability and status Nutritional status Physical activity Advanced directives List of other physicians Hospitalizations, surgeries, and ER visits in previous 12 months Vitals Screenings to include cognitive, depression, and falls Referrals and appointments  In addition, I have reviewed and discussed with patient certain preventive protocols, quality metrics, and best practice recommendations. A written personalized care plan for preventive services as well as general preventive health recommendations were provided to patient.     Mickeal Needy, LPN   29/09/6211   After Visit Summary: (MyChart) Due to this being a telephonic visit, the after visit summary with patients personalized plan was offered to patient via MyChart   Nurse Notes: None

## 2023-04-24 NOTE — Patient Instructions (Signed)
Ms. Endress , Thank you for taking time to come for your Medicare Wellness Visit. I appreciate your ongoing commitment to your health goals. Please review the following plan we discussed and let me know if I can assist you in the future.   Referrals/Orders/Follow-Ups/Clinician Recommendations: No  This is a list of the screening recommended for you and due dates:  Health Maintenance  Topic Date Due   Hepatitis C Screening  Never done   Zoster (Shingles) Vaccine (2 of 2) 09/30/2022   Flu Shot  12/21/2022   COVID-19 Vaccine (5 - 2023-24 season) 01/21/2023   Colon Cancer Screening  09/04/2023*   Medicare Annual Wellness Visit  04/23/2024   DTaP/Tdap/Td vaccine (2 - Td or Tdap) 12/15/2030   Pneumonia Vaccine  Completed   DEXA scan (bone density measurement)  Completed   HPV Vaccine  Aged Out  *Topic was postponed. The date shown is not the original due date.    Advanced directives: (In Chart) A copy of your advanced directives are scanned into your chart should your provider ever need it.  Next Medicare Annual Wellness Visit scheduled for next year: No  *Preventive Care attachment *FALL PREVENTION attachment

## 2023-05-03 ENCOUNTER — Ambulatory Visit: Payer: Medicare Other | Attending: Cardiovascular Disease

## 2023-05-03 DIAGNOSIS — Z7901 Long term (current) use of anticoagulants: Secondary | ICD-10-CM | POA: Insufficient documentation

## 2023-05-03 DIAGNOSIS — I482 Chronic atrial fibrillation, unspecified: Secondary | ICD-10-CM | POA: Insufficient documentation

## 2023-05-03 LAB — POCT INR: INR: 2.3 (ref 2.0–3.0)

## 2023-05-03 NOTE — Patient Instructions (Signed)
Description   Continue taking Warfarin 1 tablet daily except 1/2 tablet on Sundays.  Recheck INR in 5 weeks.  [facility] Clinic [phone]

## 2023-05-05 NOTE — Progress Notes (Unsigned)
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   PRIOR THERAPY: 1) Status post right frontal craniotomy and resection of solitary brain metastasis in March 2021 followed by postoperative SRS at [facility]. 2) Status post right radical nephrectomy on Oct 16, 2019 with the final pathologic stage of pT3a, pN0.  The final pathology showed indeterminant subtype with nuclear grade 3. 3) Status post stereotactic radiotherapy on March 03, 2020 to metastatic brain metastasis when the patient developed 2.0 cm mass in the right frontal lobe consistent with recurrent disease. 4) Status post SRS on February 08, 2021 for another isolated left frontal lobe metastasis. 5) palliative radiotherapy to the soft tissue nodule along the superior margin of the nephrectomy bed that was hypermetabolic on the PET scan under the care of Dr. Mitzi Hansen.  CURRENT THERAPY: Observation   INTERVAL HISTORY: Jacqueline Palmer 77 y.o. female returns to the clinic today for a follow up visit. She was last seen in the clinic by Dr. Arbutus Ped on 01/16/23. She is on observation for her history of renal cell carcinoma. She denies any major changes in her health since last being seen. She has a mild cold/runny nose. She lives at a retirement community and tries to socially distance to avoid exposures.  She denies any fever, chills, night sweats, unexplained weight loss. She has a good appetite. Denies any chest pain, shortness of breath, cough, or hemoptysis.  Denies any nausea, vomiting, diarrhea, or constipation.  Denies any visual changes. She may get a fleeting headache but nothing unusual. She follows closely with Dr. Barbaraann Cao for her history of metastatic disease to the brain.  She was last seen in October  2024 and was clinically and radiographically stable.  They are monitoring her with repeat imaging every 6 months.  Denies any back pain.  Denies any malodorous urine, hematuria, or dysuria.  She follows closely with nephrology for CKD. She recently had a restaging CT scan performed.  She is here today for evaluation to review her scan results.    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  MEDICATIONS:  Current Outpatient Medications  Medication Sig Dispense Refill   acetaminophen (TYLENOL) 500 MG tablet Take 1,000 mg by mouth daily with breakfast.     colchicine 0.6 MG tablet TAKE ONE TABLET BY MOUTH DAILY AT BEDTIME 90 tablet 3   hydrALAZINE (APRESOLINE) 10 MG tablet Take 1 tablet (  10 mg total) by mouth 3 (three) times daily. 270 tablet 4   levETIRAcetam (KEPPRA) 500 MG tablet Take 1 tablet (500 mg total) by mouth 2 (two) times daily. 180 tablet 3   levothyroxine (SYNTHROID) 25 MCG tablet TAKE 1 TABLET BY MOUTH ONCE DAILY IN THE MORNING 30 TO 60 MINUTES BEFORE BREAKFAST ON AN EMPTY STOMACH WITH A FULL GLASS OF WATER 90 tablet 4   metoprolol succinate (TOPROL-XL) 25 MG 24 hr tablet Take 1 tablet (25 mg  total) by mouth daily. 90 tablet 4   nitroGLYCERIN (NITROSTAT) 0.4 MG SL tablet Place 1 tablet (0.4 mg total) under the tongue every 5 (five) minutes as needed for chest pain. 25 tablet 3   OVER THE COUNTER MEDICATION Apply 1 application topically 2 (two) times daily. Pain cream in groin area     polyethylene glycol (MIRALAX / GLYCOLAX) 17 g packet Take 17 g by mouth daily as needed for mild constipation.     pravastatin (PRAVACHOL) 40 MG tablet Take 1 tablet (40 mg total) by mouth daily. 90 tablet 4   spironolactone (ALDACTONE) 25 MG tablet Take 1 tablet (25 mg total) by mouth daily. Please schedule appointment with Dr. Cristal Deer for refills. 90 tablet 4   torsemide (DEMADEX) 20 MG tablet TAKE 2 TABLETS BY MOUTH EVERY MORNING AND 1 TABLET EVERY EVENING 270 tablet 4   warfarin (COUMADIN) 5 MG tablet TAKE ONE TABLET BY MOUTH DAILY IN THE EVENING OR AS DIRECTED BY THE COUMADIN CLINIC 100 tablet 1   No current facility-administered medications for this visit.    SURGICAL HISTORY:  Past Surgical History:  Procedure Laterality Date   ABDOMINAL HYSTERECTOMY     CORONARY ARTERY BYPASS GRAFT     CRANIOTOMY Right 07/30/2019   Right frontal   NEPHRECTOMY Right 10/16/2019   TOTAL HIP ARTHROPLASTY Right 07/11/2021   Procedure: RIGHT TOTAL HIP ARTHROPLASTY ANTERIOR APPROACH;  Surgeon: Gean Birchwood, MD;  Location: [facility];  Service: Orthopedics;  Laterality: Right;    REVIEW OF SYSTEMS:   Review of Systems  Constitutional: Negative for appetite change, chills, fatigue, fever and unexpected weight change.  HENT: Positive for mild runny nose/congestion. Negative for mouth sores, nosebleeds, sore throat and trouble swallowing.   Eyes: Negative for eye problems and icterus.  Respiratory: Negative for cough, hemoptysis, shortness of breath and wheezing.   Cardiovascular: Negative for chest pain and leg swelling.  Gastrointestinal: Negative for abdominal pain, constipation, diarrhea, nausea and vomiting.   Genitourinary: Negative for bladder incontinence, difficulty urinating, dysuria, frequency and hematuria.   Musculoskeletal: Negative for back pain, gait problem, neck pain and neck stiffness.  Skin: Negative for itching and rash.  Neurological: Negative for dizziness, extremity weakness, gait problem, headaches, light-headedness and seizures.  Hematological: Negative for adenopathy. Does not bruise/bleed easily.  Psychiatric/Behavioral: Negative for confusion, depression and sleep disturbance. The patient is not nervous/anxious.     PHYSICAL EXAMINATION:  Blood pressure 134/84, pulse 71, temperature (!) 97.4 F (36.3 C), temperature source Temporal, resp. rate 18, weight 191 lb 1.6 oz (86.7 kg), SpO2 100%.  ECOG PERFORMANCE STATUS: 1  Physical Exam  Constitutional: Oriented to person, place, and time and well-developed, well-nourished, and in no distress.  HENT:  Head: Normocephalic and atraumatic.  Mouth/Throat: Oropharynx is clear and moist. No oropharyngeal exudate.  Eyes: Conjunctivae are normal. Right eye exhibits no discharge. Left eye exhibits no discharge. No scleral icterus.  Neck: Normal range of motion. Neck supple.  Cardiovascular: Normal rate, regular rhythm, normal heart  sounds and intact distal pulses.   Pulmonary/Chest: Effort normal and breath sounds normal. No respiratory distress. No wheezes. No rales.  Abdominal: Soft. Bowel sounds are normal. Exhibits no distension and no mass. There is no tenderness.  Musculoskeletal: Normal range of motion. Exhibits no edema.  Lymphadenopathy:    No cervical adenopathy.  Neurological: Alert and oriented to person, place, and time. Exhibits normal muscle tone. Gait normal. Coordination normal. Uses a cane for ambulation.  Skin: Skin is warm and dry. No rash noted. Not diaphoretic. No erythema. No pallor.  Psychiatric: Mood, memory and judgment normal.  Vitals reviewed.  LABORATORY DATA: Lab Results  Component Value Date    WBC 5.4 04/09/2023   HGB 12.2 04/09/2023   HCT 37.8 04/09/2023   MCV 74.9 (L) 04/09/2023   PLT 209 04/09/2023      Chemistry      Component Value Date/Time   NA 136 04/09/2023 1111   NA 141 12/14/2020 1053   K 4.6 04/09/2023 1111   CL 103 04/09/2023 1111   CO2 26 04/09/2023 1111   BUN 48 (H) 04/09/2023 1111   BUN 45 (H) 12/14/2020 1053   CREATININE 2.61 (H) 04/09/2023 1111      Component Value Date/Time   CALCIUM 9.7 04/09/2023 1111   ALKPHOS 97 04/09/2023 1111   AST 25 04/09/2023 1111   ALT 18 04/09/2023 1111   BILITOT 0.7 04/09/2023 1111       RADIOGRAPHIC STUDIES:  No results found.    ASSESSMENT/PLAN:  This is a very pleasant 77 year old African-American female with stage IV papillary renal cell carcinoma.  She was diagnosed in January 2021.  She presented with a right kidney tumor and isolated CNS metastasis.  The patient has the following treatment: 1) status post right frontal craniotomy and resection of solitary brain metastasis in March 2021 followed by postoperative SRS at [facility]. 2) Status post right radical nephrectomy on Oct 16, 2019 with the final pathologic stage of pT3a, pN0.  The final pathology showed indeterminant subtype with nuclear grade 3. 3) Status post stereotactic radiotherapy on March 03, 2020 to metastatic brain metastasis when the patient developed 2.0 cm mass in the right frontal lobe consistent with recurrent disease. 4) Status post SRS on February 08, 2021 for another isolated left frontal lobe metastasis. 5) palliative radiotherapy to the hypermetabolic soft tissue nodule in the right nephrectomy bed under the care of Dr. Mitzi Hansen  The patient was seen with Dr. Arbutus Ped today.  Dr. Arbutus Ped personally and independently reviewed the scan and discussed results with the patient today.  The scan showed stable/persistent soft tissue lesion in the nephrectomy bed.  Dr. Arbutus Ped recommends continuing on observation with  repeat imaging in 3 months.   She will continue to follow with neuro-oncology for her history of metastatic disease to the brain.   The patient was advised to call immediately if she has any concerning symptoms in the interval. The patient voices understanding of current disease status and treatment options and is in agreement with the current care plan. All questions were answered. The patient knows to call the clinic with any problems, questions or concerns. We can certainly see the patient much sooner if necessary     Orders Placed This Encounter  Procedures   CT CHEST ABDOMEN PELVIS WO CONTRAST    Standing Status:   Future    Expected Date:   07/31/2023    Expiration Date:   05/07/2024    Preferred imaging location?:  [facility]    If indicated for the ordered procedure, I authorize the administration of oral contrast media per Radiology protocol:   Yes    Does the patient have a contrast media/X-ray dye allergy?:   No   CBC with Differential (Cancer Center Only)    Standing Status:   Future    Expected Date:   08/06/2023    Expiration Date:   05/07/2024   CMP (Cancer Center only)    Standing Status:   Future    Expected Date:   08/06/2023    Expiration Date:   05/07/2024     Kimbely Whiteaker L Glennon Kopko, PA-C 05/08/23  ADDENDUM: Hematology/Oncology Attending:  I had a face-to-face encounter with the patient today.  I reviewed her record, lab, scan and recommended her care plan.  This is a very pleasant 77 years old African-American female with stage IV papillary renal cell carcinoma diagnosed in January 2021 presented with right kidney tumor in addition to isolated brain metastasis.  The patient is status post right frontal craniotomy with resection of the solitary brain tumor followed by [facility] followed by right radical nephrectomy on Oct 16, 2019 followed again by stereotactic radiotherapy to metastatic brain lesions in October 2021 and  September 2022.  The patient also had palliative radiotherapy to soft tissue nodule along the superior margin of the nephrectomy bed.  She has been on observation with no systemic treatment so far.  She had repeat CT scan of the chest, abdomen and pelvis performed recently.  I personally and independently reviewed the scan and discussed the result with the patient today. Her scan showed no concerning findings for disease progression. I recommended for the patient to continue on observation with repeat CT scan of the chest, abdomen and pelvis in 3 months. She was advised to call immediately if she has any other concerning symptoms in the interval. The total time spent in the appointment was 30 minutes. Disclaimer: This note was dictated with voice recognition software. Similar sounding words can inadvertently be transcribed and may be missed upon review. Lajuana Matte, MD

## 2023-05-08 ENCOUNTER — Inpatient Hospital Stay: Payer: Medicare Other | Attending: Internal Medicine | Admitting: Physician Assistant

## 2023-05-08 VITALS — BP 134/84 | HR 71 | Temp 97.4°F | Resp 18 | Wt 191.1 lb

## 2023-05-08 DIAGNOSIS — C7931 Secondary malignant neoplasm of brain: Secondary | ICD-10-CM | POA: Diagnosis not present

## 2023-05-08 DIAGNOSIS — Z905 Acquired absence of kidney: Secondary | ICD-10-CM | POA: Insufficient documentation

## 2023-05-08 DIAGNOSIS — C641 Malignant neoplasm of right kidney, except renal pelvis: Secondary | ICD-10-CM | POA: Diagnosis present

## 2023-05-08 DIAGNOSIS — N189 Chronic kidney disease, unspecified: Secondary | ICD-10-CM | POA: Insufficient documentation

## 2023-05-08 DIAGNOSIS — R0989 Other specified symptoms and signs involving the circulatory and respiratory systems: Secondary | ICD-10-CM | POA: Insufficient documentation

## 2023-05-30 ENCOUNTER — Telehealth: Payer: Self-pay | Admitting: Internal Medicine

## 2023-05-30 NOTE — Telephone Encounter (Signed)
 Noted! Thank you

## 2023-05-30 NOTE — Telephone Encounter (Signed)
 I received form from patient's assisted living facility [facility] requesting authorization for physical therapy and speech therapy.  Please give patient an 8:10 a.m video visit to discuss.

## 2023-05-30 NOTE — Telephone Encounter (Signed)
 Pt calling back to schedule virtual visit. Please call patient to schedule, [phone].

## 2023-05-30 NOTE — Telephone Encounter (Signed)
 Patient scheduled for tomorrow

## 2023-05-30 NOTE — Telephone Encounter (Signed)
 Called but no answer. LVM to call back. Please schedule a virtual appointment.

## 2023-05-31 ENCOUNTER — Encounter: Payer: Self-pay | Admitting: Internal Medicine

## 2023-05-31 ENCOUNTER — Ambulatory Visit: Payer: Medicare Other | Attending: Internal Medicine | Admitting: Internal Medicine

## 2023-05-31 ENCOUNTER — Ambulatory Visit (INDEPENDENT_AMBULATORY_CARE_PROVIDER_SITE_OTHER): Payer: Medicare Other

## 2023-05-31 VITALS — BP 108/70 | HR 70 | Temp 97.7°F | Ht 68.0 in | Wt 191.0 lb

## 2023-05-31 DIAGNOSIS — Z96641 Presence of right artificial hip joint: Secondary | ICD-10-CM | POA: Diagnosis not present

## 2023-05-31 DIAGNOSIS — Z7901 Long term (current) use of anticoagulants: Secondary | ICD-10-CM | POA: Insufficient documentation

## 2023-05-31 DIAGNOSIS — Z952 Presence of prosthetic heart valve: Secondary | ICD-10-CM | POA: Diagnosis not present

## 2023-05-31 DIAGNOSIS — R4789 Other speech disturbances: Secondary | ICD-10-CM

## 2023-05-31 DIAGNOSIS — I059 Rheumatic mitral valve disease, unspecified: Secondary | ICD-10-CM | POA: Diagnosis not present

## 2023-05-31 DIAGNOSIS — Z79890 Hormone replacement therapy (postmenopausal): Secondary | ICD-10-CM | POA: Insufficient documentation

## 2023-05-31 DIAGNOSIS — R569 Unspecified convulsions: Secondary | ICD-10-CM | POA: Insufficient documentation

## 2023-05-31 DIAGNOSIS — Z95810 Presence of automatic (implantable) cardiac defibrillator: Secondary | ICD-10-CM | POA: Insufficient documentation

## 2023-05-31 DIAGNOSIS — M8589 Other specified disorders of bone density and structure, multiple sites: Secondary | ICD-10-CM | POA: Diagnosis not present

## 2023-05-31 DIAGNOSIS — R479 Unspecified speech disturbances: Secondary | ICD-10-CM | POA: Insufficient documentation

## 2023-05-31 DIAGNOSIS — M79651 Pain in right thigh: Secondary | ICD-10-CM | POA: Diagnosis not present

## 2023-05-31 DIAGNOSIS — M25551 Pain in right hip: Secondary | ICD-10-CM | POA: Insufficient documentation

## 2023-05-31 DIAGNOSIS — M109 Gout, unspecified: Secondary | ICD-10-CM | POA: Insufficient documentation

## 2023-05-31 DIAGNOSIS — I130 Hypertensive heart and chronic kidney disease with heart failure and stage 1 through stage 4 chronic kidney disease, or unspecified chronic kidney disease: Secondary | ICD-10-CM | POA: Diagnosis not present

## 2023-05-31 DIAGNOSIS — C641 Malignant neoplasm of right kidney, except renal pelvis: Secondary | ICD-10-CM | POA: Diagnosis not present

## 2023-05-31 DIAGNOSIS — Z23 Encounter for immunization: Secondary | ICD-10-CM | POA: Insufficient documentation

## 2023-05-31 DIAGNOSIS — Z951 Presence of aortocoronary bypass graft: Secondary | ICD-10-CM | POA: Diagnosis not present

## 2023-05-31 DIAGNOSIS — I4821 Permanent atrial fibrillation: Secondary | ICD-10-CM | POA: Insufficient documentation

## 2023-05-31 DIAGNOSIS — Z9181 History of falling: Secondary | ICD-10-CM | POA: Diagnosis present

## 2023-05-31 DIAGNOSIS — Z79899 Other long term (current) drug therapy: Secondary | ICD-10-CM | POA: Diagnosis not present

## 2023-05-31 DIAGNOSIS — I428 Other cardiomyopathies: Secondary | ICD-10-CM | POA: Diagnosis not present

## 2023-05-31 DIAGNOSIS — I5022 Chronic systolic (congestive) heart failure: Secondary | ICD-10-CM

## 2023-05-31 DIAGNOSIS — I2510 Atherosclerotic heart disease of native coronary artery without angina pectoris: Secondary | ICD-10-CM | POA: Diagnosis not present

## 2023-05-31 DIAGNOSIS — N184 Chronic kidney disease, stage 4 (severe): Secondary | ICD-10-CM | POA: Diagnosis not present

## 2023-05-31 DIAGNOSIS — R110 Nausea: Secondary | ICD-10-CM | POA: Insufficient documentation

## 2023-05-31 DIAGNOSIS — E890 Postprocedural hypothyroidism: Secondary | ICD-10-CM | POA: Insufficient documentation

## 2023-05-31 DIAGNOSIS — C7931 Secondary malignant neoplasm of brain: Secondary | ICD-10-CM | POA: Diagnosis not present

## 2023-05-31 DIAGNOSIS — I482 Chronic atrial fibrillation, unspecified: Secondary | ICD-10-CM

## 2023-05-31 DIAGNOSIS — Z923 Personal history of irradiation: Secondary | ICD-10-CM | POA: Insufficient documentation

## 2023-05-31 DIAGNOSIS — I5042 Chronic combined systolic (congestive) and diastolic (congestive) heart failure: Secondary | ICD-10-CM | POA: Insufficient documentation

## 2023-05-31 DIAGNOSIS — Z87891 Personal history of nicotine dependence: Secondary | ICD-10-CM | POA: Insufficient documentation

## 2023-05-31 MED ORDER — ZOSTER VAC RECOMB ADJUVANTED 50 MCG/0.5ML IM SUSR: 0.5000 mL | Freq: Once | INTRAMUSCULAR | 0 refills | Status: AC

## 2023-05-31 NOTE — Progress Notes (Signed)
 Patient ID: Jacqueline Palmer, female    DOB: [birth year]  MRN: [id number]  CC: Follow-up (Follow-up. /Physical therapy is recommending pt to receive OT & SLP. Orvis on 05/19/23 - reports no major concerns/Yes to shingles & flu vax.)   Subjective: Klani Palmer is a 78 y.o. female who presents for chronic ds management. Her concerns today include:  Patient with history of CAD with history CABGx4, chronic diastolic CHF/NICM (recovered LVEF) with BV-ICD, HTN, rheumatic heart disease status post MVR replacement, permanent A. 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     Fell 2 wks ago in the bathroom at her AL facility.  went to [facility] because she felt nauseated. Tried to sit on thrash can which slipped from under her. Landed on floor.  She laid on the cold floor for 1 hr despite activating her life alert because EMS did not have a pass key so they had to call Fire Depart Since then some soreness RT lateral thigh and hip.  She had right total hip replacement surgery about 2 years ago.  She ambulates with a cane. P.T at her facility has recommended some physical therapy just to ensure gait safety.  Patient also reports telling them that sometimes she has difficulty recalling or getting her words out.  Speech therapy was also recommended.  I have the form today.  Patient thinks she may also need some OT but therapist has recommended physical therapy and speech therapy only.  HTN/CAD/A-fib/CHF:  Compliant with  medications including Coumadin , hydralazine  10 mg 3 times a day, metoprolol  25 mg daily, pravastatin  40 mg daily, spironolactone  25 mg daily and torsemide  2 tablets by  mouth every morning and 1 tablet in the evenings. Last saw cardiology 01/2023. -limits salt in foods. No CP/SOB/LE edema Does not check BP.  She has not had any bleeding on Coumadin .  She is followed by Coumadin  clinic through her cardiologist.  Last CBC done 03/2023 showed stable hemoglobin of 12.2.  Hypothyroid: taking Levothyroxine  consistently.  Distributor was recently changed.  She is due for TSH check today.  CKD 4: will see neph next wk; had blood work done today for them in anticipation of that visit.  GFR has remained between 15-19. History of renal cell CA with brain mets: She completed 5 XRT treatments last year for soft tissue nodule along the superior aspect of the right nephrectomy bed that was suspicious for recurrence.  Had repeat CAT scan in November that showed persistent soft tissue nodule in the same location concerning for locally recurrent disease.  MRI of brain in October multiple enhancing lesion present in the skull.. Patient states she has an appointment coming up with Dr. Sherrod to discuss options.  SZ: on Keppra  BID.  No seizure activity since last visit with me.    HM: Yes to flu shot and shingles vaccine #2. Patient Active Problem List   Diagnosis Date Noted   Goals of care, counseling/discussion 10/12/2022   Coronary artery disease of autologous vein bypass graft with stable angina pectoris (HCC) 08/14/2022   Advance directive in chart 04/24/2021   Ascending aortic aneurysm (HCC) 04/24/2021   Gait disturbance 04/19/2021   Acute-on-chronic kidney injury (  HCC) 12/03/2020   Subtherapeutic international normalized ratio (INR) 12/03/2020   Chest pain 12/02/2020   Neuropathic pain 11/25/2020   Seizures (HCC) 06/16/2020   Over weight 05/03/2020   Nonischemic cardiomyopathy (HCC) 03/04/2020   Biventricular automatic implantable cardioverter defibrillator in situ 02/19/2020   Chronic diastolic heart failure (HCC) 02/19/2020   Hx of CABG 02/19/2020   Coronary  artery disease involving native coronary artery of native heart without angina pectoris 02/19/2020   Essential hypertension 02/19/2020   Permanent atrial fibrillation (HCC) 02/19/2020   CKD (chronic kidney disease) stage 4, GFR 15-29 ml/min (HCC) 02/19/2020   Renal cell carcinoma of right kidney (HCC) 02/12/2020   Malignant neoplasm metastatic to brain (HCC) 02/12/2020   Gout attack 08/14/2016   Chronic a-fib (HCC) 08/14/2016   Chronic anticoagulation 08/14/2016   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     Current Outpatient Medications on File Prior to Visit  Medication Sig Dispense Refill   acetaminophen  (TYLENOL ) 500 MG tablet Take 1,000 mg by mouth daily with breakfast.     colchicine  0.6 MG tablet TAKE ONE TABLET BY MOUTH DAILY AT BEDTIME 90 tablet 3   hydrALAZINE  (APRESOLINE ) 10 MG tablet Take 1 tablet (10 mg total) by mouth 3 (three) times daily. 270 tablet 4   levETIRAcetam  (KEPPRA ) 500 MG tablet Take 1 tablet (500 mg total) by mouth 2 (two) times daily. 180 tablet 3   levothyroxine  (SYNTHROID ) 25 MCG tablet TAKE 1 TABLET BY MOUTH ONCE DAILY IN THE MORNING 30 TO 60 MINUTES BEFORE BREAKFAST ON AN EMPTY STOMACH WITH A FULL GLASS OF WATER  90 tablet 4   metoprolol  succinate (TOPROL -XL) 25 MG 24 hr tablet Take 1 tablet (25 mg total) by mouth daily. 90 tablet 4   nitroGLYCERIN  (NITROSTAT ) 0.4 MG SL tablet Place 1 tablet (0.4 mg total) under the tongue every 5 (five) minutes as needed for chest pain. 25 tablet 3   OVER THE COUNTER MEDICATION Apply 1 application topically 2 (two) times daily. Pain cream in groin area     polyethylene glycol (MIRALAX  / GLYCOLAX ) 17 g packet Take 17 g by mouth daily as needed for mild constipation.     pravastatin  (PRAVACHOL ) 40 MG tablet Take 1 tablet (40 mg total) by mouth daily. 90 tablet 4   spironolactone  (ALDACTONE ) 25 MG tablet Take 1 tablet (25 mg total) by mouth daily. Please schedule appointment with Dr. Lonni for refills. 90 tablet 4   torsemide   (DEMADEX ) 20 MG tablet TAKE 2 TABLETS BY MOUTH EVERY MORNING AND 1 TABLET EVERY EVENING 270 tablet 4   warfarin (COUMADIN ) 5 MG tablet TAKE ONE TABLET BY MOUTH DAILY IN THE EVENING OR AS DIRECTED BY THE COUMADIN  CLINIC 100 tablet 1   No current facility-administered medications on file prior to visit.    Allergies  Allergen Reactions   Avocado Shortness Of Breath    Before 2007   Banana Anaphylaxis and Swelling   Plantain Anaphylaxis and Swelling   Ace Inhibitors Cough   Codeine Other (See Comments)    Nausea and Feels Jittery   Prednisone  Swelling    Of face and lower extremities   Tape Hives    Tape  Does better with paper tape    Latex Rash   Pharmabase Cosmetic [[place]] Swelling and Rash    Some Soap, perfumes,detergent    Social History   Socioeconomic History   Marital status: Widowed    Spouse name: Not on file   Number of children: 0   Years of  education: Doctorate   Highest education level: Doctorate  Occupational History   Occupation: Retired Orthoptist  Tobacco Use   Smoking status: Former    Current packs/day: 0.00    Types: Cigarettes    Quit date: 07/30/2014    Years since quitting: 8.8   Smokeless tobacco: Never  Vaping Use   Vaping status: Never Used  Substance and Sexual Activity   Alcohol use: Not Currently    Comment: 1 glass wine twice weekly   Drug use: No   Sexual activity: Not on file  Other Topics Concern   Not on file  Social History Narrative   Right handed   Coffee daily/tea qod   Social Drivers of Health   Financial Resource Strain: Low Risk  (04/24/2023)   Overall Financial Resource Strain (CARDIA)    Difficulty of Paying Living Expenses: Not very hard  Food Insecurity: No Food Insecurity (04/24/2023)   Hunger Vital Sign    Worried About Running Out of Food in the Last Year: Never true    Ran Out of Food in the Last Year: Never true  Transportation Needs: Unknown (04/24/2023)   PRAPARE - Administrator, Civil Service  (Medical): Patient declined    Lack of Transportation (Non-Medical): No  Physical Activity: Insufficiently Active (04/24/2023)   Exercise Vital Sign    Days of Exercise per Week: 2 days    Minutes of Exercise per Session: 20 min  Stress: No Stress Concern Present (04/24/2023)   [employer] of Occupational Health - Occupational Stress Questionnaire    Feeling of Stress : Only a little  Social Connections: Moderately Integrated (04/24/2023)   Social Connection and Isolation Panel [NHANES]    Frequency of Communication with Friends and Family: Once a week    Frequency of Social Gatherings with Friends and Family: More than three times a week    Attends Religious Services: More than 4 times per year    Active Member of [employer] or Organizations: Yes    Attends Banker Meetings: More than 4 times per year    Marital Status: Widowed  Intimate Partner Violence: Not At Risk (04/24/2023)   Humiliation, Afraid, Rape, and Kick questionnaire    Fear of Current or Ex-Partner: No    Emotionally Abused: No    Physically Abused: No    Sexually Abused: No    Family History  Problem Relation Age of Onset   Heart attack Mother    Heart attack Father    Heart attack Sister    Breast cancer Neg Hx     Past Surgical History:  Procedure Laterality Date   ABDOMINAL HYSTERECTOMY     CORONARY ARTERY BYPASS GRAFT     CRANIOTOMY Right 07/30/2019   Right frontal   NEPHRECTOMY Right 10/16/2019   TOTAL HIP ARTHROPLASTY Right 07/11/2021   Procedure: RIGHT TOTAL HIP ARTHROPLASTY ANTERIOR APPROACH;  Surgeon: Liam Lerner, MD;  Location: [facility];  Service: Orthopedics;  Laterality: Right;    ROS: Review of Systems Negative except as stated above  PHYSICAL EXAM: BP 108/70   Pulse 70   Temp 97.7 F (36.5 C) (Oral)   Ht 5' 8 (1.727 m)   Wt 191 lb (86.6 kg)   SpO2 100%   BMI 29.04 kg/m   Physical Exam  General appearance - alert, well appearing, and in no distress Mental status -  normal mood, behavior, speech, dress, motor activity, and thought processes Neck - supple, no significant adenopathy Chest - clear  to auscultation, no wheezes, rales or rhonchi, symmetric air entry Heart -heart sounds regular  Neurological -patient ambulates with cane.  Gait is slow with low foot floor clearance. Extremities - peripheral pulses normal, no pedal edema, no clubbing or cyanosis      Latest Ref Rng & Units 04/09/2023   11:11 AM 01/12/2023    9:41 AM 09/14/2022    9:39 AM  CMP  Glucose 70 - 99 mg/dL 85  98  77   BUN 8 - 23 mg/dL 48  42  50   Creatinine 0.44 - 1.00 mg/dL 7.38  7.49  7.16   Sodium 135 - 145 mmol/L 136  136  139   Potassium 3.5 - 5.1 mmol/L 4.6  4.2  4.6   Chloride 98 - 111 mmol/L 103  103  105   CO2 22 - 32 mmol/L 26  26  27    Calcium 8.9 - 10.3 mg/dL 9.7  9.3  89.8   Total Protein 6.5 - 8.1 g/dL 7.6  7.3  7.8   Total Bilirubin <1.2 mg/dL 0.7  0.5  0.6   Alkaline Phos 38 - 126 U/L 97  88  103   AST 15 - 41 U/L 25  23  20    ALT 0 - 44 U/L 18  19  13     Lipid Panel     Component Value Date/Time   CHOL 181 03/28/2022 1157   TRIG 118 03/28/2022 1157   HDL 64 03/28/2022 1157   CHOLHDL 2.8 03/28/2022 1157   LDLCALC 96 03/28/2022 1157    CBC    Component Value Date/Time   WBC 5.4 04/09/2023 1111   WBC 6.0 09/14/2022 0939   RBC 5.05 04/09/2023 1111   HGB 12.2 04/09/2023 1111   HGB 12.4 08/18/2021 1219   HCT 37.8 04/09/2023 1111   HCT 39.5 08/18/2021 1219   PLT 209 04/09/2023 1111   PLT 292 08/18/2021 1219   MCV 74.9 (L) 04/09/2023 1111   MCV 82 08/18/2021 1219   MCH 24.2 (L) 04/09/2023 1111   MCHC 32.3 04/09/2023 1111   RDW 14.3 04/09/2023 1111   RDW 16.0 (H) 08/18/2021 1219   LYMPHSABS 1.3 04/09/2023 1111   MONOABS 1.0 04/09/2023 1111   EOSABS 0.3 04/09/2023 1111   BASOSABS 0.0 04/09/2023 1111    ASSESSMENT AND PLAN: 1. History of recent fall (Primary) I agree that she would benefit from some physical therapy for gait safety  training.  2. Nausea Patient reports mild nausea in the mornings.  I recommend Zofran  but patient declines any further medications at this time.  3. Word finding problem I agree for some speech therapy for her.  4. CKD (chronic kidney disease) stage 4, GFR 15-29 ml/min (HCC) Stable.  Followed by nephrology.  Not on NSAIDs.  5. Coronary artery disease involving native coronary artery of native heart without angina pectoris Continue metoprolol  25 mg daily and Pravachol  40 mg daily  6. Chronic a-fib (HCC) Rate control.  On Coumadin  and followed by Coumadin  clinic.  Hemoglobin stable.  7. Malignant neoplasm metastatic to brain [facility]) Followed by oncology.  8. Seizures (HCC) Continue Keppra .  No recent seizures  9. 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 Need for shingles vaccine Prescription given for her to get Shingrix vaccine at her pharmacy - Zoster  Vaccine Adjuvanted (SHINGRIX) injection; Inject 0.5 mLs into the muscle once for 1 dose.  Dispense: 0.5 mL; Refill: 0  12. Encounter for immunization - Flu Vaccine Trivalent High Dose (Fluad)    Patient was given the opportunity to ask questions.  Patient verbalized understanding of the plan and was able to repeat key elements of the plan.   This documentation was completed using Paediatric nurse.  Any transcriptional errors are unintentional.  Orders Placed This Encounter  Procedures   Flu Vaccine Trivalent High Dose (Fluad)   TSH     Requested Prescriptions   Signed Prescriptions Disp Refills   Zoster Vaccine Adjuvanted [facility]) injection 0.5 mL 0    Sig: Inject 0.5 mLs into the muscle once for 1 dose.    Return in about 4 months (around 09/28/2023).  Barnie Louder, MD, FACP

## 2023-06-01 LAB — CUP PACEART REMOTE DEVICE CHECK
Battery Remaining Longevity: 24 mo
Battery Remaining Percentage: 32 %
Brady Statistic RA Percent Paced: 0 %
Brady Statistic RV Percent Paced: 100 %
Date Time Interrogation Session: 20250110055600
HighPow Impedance: 74 Ohm
Implantable Lead Connection Status: 753985
Implantable Lead Connection Status: 753985
Implantable Lead Connection Status: 753985
Implantable Lead Implant Date: 20180423
Implantable Lead Implant Date: 20180423
Implantable Lead Implant Date: 20180423
Implantable Lead Location: 753858
Implantable Lead Location: 753859
Implantable Lead Location: 753860
Implantable Lead Model: 292
Implantable Lead Model: 4672
Implantable Lead Model: 7740
Implantable Lead Serial Number: 431793
Implantable Lead Serial Number: 602825
Implantable Lead Serial Number: 703305
Implantable Pulse Generator Implant Date: 20180423
Lead Channel Impedance Value: 373 Ohm
Lead Channel Impedance Value: 472 Ohm
Lead Channel Impedance Value: 996 Ohm
Lead Channel Pacing Threshold Amplitude: 0.5 V
Lead Channel Pacing Threshold Amplitude: 0.6 V
Lead Channel Pacing Threshold Pulse Width: 0.4 ms
Lead Channel Pacing Threshold Pulse Width: 0.4 ms
Lead Channel Setting Pacing Amplitude: 1.6 V
Lead Channel Setting Pacing Amplitude: 2 V
Lead Channel Setting Pacing Pulse Width: 0.4 ms
Lead Channel Setting Pacing Pulse Width: 0.4 ms
Lead Channel Setting Sensing Sensitivity: 0.6 mV
Lead Channel Setting Sensing Sensitivity: 1 mV
Pulse Gen Serial Number: 174046
Zone Setting Status: 755011

## 2023-06-01 LAB — TSH: TSH: 2.53 u[IU]/mL (ref 0.450–4.500)

## 2023-06-07 ENCOUNTER — Ambulatory Visit (HOSPITAL_COMMUNITY)
Admission: RE | Admit: 2023-06-07 | Discharge: 2023-06-07 | Disposition: A | Discharge status: Home / Self Care | Payer: Medicare Other | Source: Ambulatory Visit | Attending: Physician Assistant | Admitting: Physician Assistant

## 2023-06-07 ENCOUNTER — Ambulatory Visit (HOSPITAL_COMMUNITY): Payer: Medicare Other

## 2023-06-07 DIAGNOSIS — Z7901 Long term (current) use of anticoagulants: Secondary | ICD-10-CM | POA: Insufficient documentation

## 2023-06-07 DIAGNOSIS — C641 Malignant neoplasm of right kidney, except renal pelvis: Secondary | ICD-10-CM | POA: Diagnosis present

## 2023-06-07 DIAGNOSIS — I482 Chronic atrial fibrillation, unspecified: Secondary | ICD-10-CM

## 2023-06-07 LAB — POCT INR: INR: 3.2 — AB (ref 2.0–3.0)

## 2023-06-07 NOTE — Patient Instructions (Signed)
Description   Today take 1/2 tablet of warfarin then continue taking Warfarin 1 tablet daily except 1/2 tablet on Sundays.  Recheck INR in 5 weeks.  Coumadin Clinic [phone]

## 2023-06-18 ENCOUNTER — Other Ambulatory Visit: Payer: Self-pay | Admitting: Cardiology

## 2023-06-21 ENCOUNTER — Telehealth: Payer: Self-pay | Admitting: *Deleted

## 2023-06-21 NOTE — Telephone Encounter (Signed)
PT Evaluation & Plan of Treatment received from [facility], signed by Dr Barbaraann Cao & faxed to [phone], fax confirmation received.

## 2023-06-22 ENCOUNTER — Other Ambulatory Visit: Payer: Self-pay | Admitting: Cardiology

## 2023-06-25 ENCOUNTER — Other Ambulatory Visit: Payer: Self-pay | Admitting: Cardiology

## 2023-06-25 NOTE — Patient Instructions (Signed)
Below is our plan:  We will continue levetiracetam 500mg  twice daily.   Please make sure you are consistent with timing of seizure medication. I recommend annual visit with primary care provider (PCP) for complete physical and routine blood work. I recommend daily intake of vitamin D (400-800iu) and calcium (800-1000mg ) for bone health. Discuss Dexa screening with PCP.   According to [place] law, you can not drive unless you are seizure / syncope free for at least 6 months and under physician's care.  Please maintain precautions. Do not participate in activities where a loss of awareness could harm you or someone else. No swimming alone, no tub bathing, no hot tubs, no driving, no operating motorized vehicles (cars, ATVs, motocycles, etc), lawnmowers, power tools or firearms. No standing at heights, such as rooftops, ladders or stairs. Avoid hot objects such as stoves, heaters, open fires. Wear a helmet when riding a bicycle, scooter, skateboard, etc. and avoid areas of traffic. Set your water heater to 120 degrees or less.  SUDEP is the sudden, unexpected death of someone with epilepsy, who was otherwise healthy. In SUDEP cases, no other cause of death is found when an autopsy is done. Each year, more than 1 in 1,000 people with epilepsy die from SUDEP. This is the leading cause of death in people with uncontrolled seizures. Until further answers are available, the best way to prevent SUDEP is to lower your risk by controlling seizures. Research has found that people with all types of epilepsy that experience convulsive seizures can be at risk.  Please make sure you are staying well hydrated. I recommend 50-60 ounces daily. Well balanced diet and regular exercise encouraged. Consistent sleep schedule with 6-8 hours recommended.   Please continue follow up with care team as directed.   Follow up with me in 1 year   You may receive a survey regarding today's visit. I encourage you to leave honest feed  back as I do use this information to improve patient care. Thank you for seeing me today!

## 2023-06-25 NOTE — Progress Notes (Signed)
 PATIENT: Jacqueline Palmer DOB: 28-Jul-1945  REASON FOR VISIT: follow up HISTORY FROM: patient  Virtual Visit via Telephone Note  I connected with Jacqueline Palmer on 06/27/23 at  8:45 AM EST by telephone and verified that I am speaking with the correct person using two identifiers.   I discussed the limitations, risks, security and privacy concerns of performing an evaluation and management service by telephone and the availability of in person appointments. I also discussed with the patient that there may be a patient responsible charge related to this service. The patient expressed understanding and agreed to proceed.   History of Present Illness:  06/27/23 ALL (Mychart): Jacqueline Palmer returns for follow up for seizures. She continues lev 500mg  BID. She reports doing well. No seizure activity. No auras. She continues close follow up with her PCP and oncologist. MRI 02/2023 showed stable posterior right frontal lesion but multiple enhancing lesions in skull. 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 She continues warfarin and pravastatin . She lives at [facility]. She rarely drives.   06/21/2022 ALL: (Mychart): Jacqueline Palmer is a 78 y.o. female here today for follow up seizures. She continues levetiracetam  500mg  BID. She continues to do well. No seizures. She is followed every 3-4 months with oncology with MRIs for monitoring. She sees Dr Vicci for PCP needs. She is followed by cardiology and nephrology as well. She continues to like at [facility] in independent living. She has two nephews in [place] and more family that live about 40 miles away. She is able to walk around her apartment without assistive device. She uses a cane for longer distance walking.   06/21/21 ALL:  Jacqueline Palmer is a 78 y.o. female here today for follow up for seizures in setting of  right frontal brain mets. She remains on Keppra  500 gm BID. She is doing well. No seizures. She lives in  [place]. She does not drive. She continues to see oncology regularly. She has MRI every three months for monitoring. She is followed by cardiology for atrial fib. S/P pace maker. She reports doing well from cardiovascular standpoint. She is planning to have right hip replacement 07/11/21. She is using cane for stability.    Observations/Objective:  Generalized: Well developed, in no acute distress  Mentation: Alert oriented to time, place, history taking. Follows all commands speech and language fluent   Assessment and Plan:  78 y.o. 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 here with    ICD-10-CM   1. Seizures (HCC)  R56.9     2. Malignant neoplasm metastatic to brain (HCC)  C79.31     3. Chronic a-fib (HCC)  I48.20     4. Lacunar infarction [facility])  I63.81       Jacqueline Palmer is doing well. We will continue levetiracetam  500mg  twice daily. Seizure precautions advised. MRI 02/2023 showed stable frontal lesion but multiple enhancing skull lesions. She will continue follow up with care team as advised. Continue warfarin and pravastatin  per PCP for stroke prevention. Healthy lifestyle habits encouraged. She will return for follow up with me in 1 year, sooner if needed.  No orders of the defined types were placed in this encounter.   Meds ordered this encounter  Medications   levETIRAcetam  (KEPPRA ) 500 MG tablet    Sig: Take 1 tablet (500 mg total) by mouth 2 (two) times daily.    Dispense:  180  tablet    Refill:  3    Supervising Provider:   INES ONETHA NOVAK [[id number]]     Follow Up Instructions:  I discussed the assessment and treatment plan with the patient. The patient was provided an opportunity to ask questions and all were answered. The patient agreed with the plan and demonstrated an understanding of the instructions.   The patient was advised to call back or seek an in-person evaluation if the symptoms worsen or if the condition fails to improve as anticipated.  I provided 30 minutes of non-face-to-face time during this encounter. Patient located at their place of residence during Mychart visit. Provider is in the office.    Jacqueline Pena, NP

## 2023-06-26 ENCOUNTER — Telehealth: Payer: Medicare Other | Admitting: Family Medicine

## 2023-06-27 ENCOUNTER — Other Ambulatory Visit: Payer: Self-pay | Admitting: Cardiology

## 2023-06-27 ENCOUNTER — Encounter: Payer: Self-pay | Admitting: Family Medicine

## 2023-06-27 ENCOUNTER — Telehealth (INDEPENDENT_AMBULATORY_CARE_PROVIDER_SITE_OTHER): Payer: Medicare Other | Admitting: Family Medicine

## 2023-06-27 DIAGNOSIS — R569 Unspecified convulsions: Secondary | ICD-10-CM | POA: Diagnosis not present

## 2023-06-27 DIAGNOSIS — C7931 Secondary malignant neoplasm of brain: Secondary | ICD-10-CM

## 2023-06-27 DIAGNOSIS — I482 Chronic atrial fibrillation, unspecified: Secondary | ICD-10-CM

## 2023-06-27 DIAGNOSIS — I6381 Other cerebral infarction due to occlusion or stenosis of small artery: Secondary | ICD-10-CM

## 2023-06-27 MED ORDER — LEVETIRACETAM 500 MG PO TABS: 500.0000 mg | ORAL_TABLET | Freq: Two times a day (BID) | ORAL | 3 refills | Status: DC

## 2023-06-28 ENCOUNTER — Other Ambulatory Visit: Payer: Self-pay | Admitting: Cardiology

## 2023-06-28 DIAGNOSIS — I2510 Atherosclerotic heart disease of native coronary artery without angina pectoris: Secondary | ICD-10-CM

## 2023-06-28 NOTE — Telephone Encounter (Signed)
 Copied from CRM [id number]. Topic: Clinical - Medication Refill >> Jun 28, 2023  1:05 PM Yvone Marda Blow wrote: Most Recent Primary Care Visit:  Provider: VICCI SOBER B  Department: [facility]  Visit Type: OFFICE VISIT  Date: 05/31/2023  Medication: pravastatin  (PRAVACHOL ) 40 MG tablet  Has the patient contacted their pharmacy? Yes (Agent: If no, request that the patient contact the pharmacy for the refill. If patient does not wish to contact the pharmacy document the reason why and proceed with request.) (Agent: If yes, when and what did the pharmacy advise?)  Is this the correct pharmacy for this prescription? Yes If no, delete pharmacy and type the correct one.  This is the patient's preferred pharmacy:  [facility] [address], [place] - [address] [place] [address] Phone: [phone] Fax: [phone]     Is the patient out of the medication? No  two pills left   Has the patient been seen for an appointment in the last year OR does the patient have an upcoming appointment? Yes  Can we respond through MyChart? Yes  Agent: Please be advised that Rx refills may take up to 3 business days. We ask that you follow-up with your pharmacy.

## 2023-06-29 MED ORDER — PRAVASTATIN SODIUM 40 MG PO TABS: 40.0000 mg | ORAL_TABLET | Freq: Every day | ORAL | 0 refills | Status: DC

## 2023-07-10 ENCOUNTER — Inpatient Hospital Stay: Payer: Medicare Other | Attending: Internal Medicine | Admitting: Internal Medicine

## 2023-07-10 VITALS — BP 119/80 | HR 70 | Temp 97.2°F | Resp 16 | Ht 68.0 in | Wt 196.3 lb

## 2023-07-10 DIAGNOSIS — Z905 Acquired absence of kidney: Secondary | ICD-10-CM | POA: Insufficient documentation

## 2023-07-10 DIAGNOSIS — Z923 Personal history of irradiation: Secondary | ICD-10-CM | POA: Diagnosis not present

## 2023-07-10 DIAGNOSIS — C641 Malignant neoplasm of right kidney, except renal pelvis: Secondary | ICD-10-CM | POA: Diagnosis present

## 2023-07-10 DIAGNOSIS — C7931 Secondary malignant neoplasm of brain: Secondary | ICD-10-CM | POA: Diagnosis not present

## 2023-07-10 NOTE — Progress Notes (Signed)
 Remote ICD transmission.

## 2023-07-10 NOTE — Addendum Note (Signed)
Addended by: Elease Etienne A on: 07/10/2023 03:18 PM   Modules accepted: Orders

## 2023-07-10 NOTE — Progress Notes (Signed)
[facility] Telephone:([phone]   Fax:([phone]  OFFICE PROGRESS NOTE  Marcine Matar, MD [address] 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  PRIOR THERAPY:  1) Status post right frontal craniotomy and resection of solitary brain metastasis in March 2021 followed by postoperative SRS at [facility]. 2) Status post right radical nephrectomy on Oct 16, 2019 with the final pathologic stage of pT3a, pN0.  The final pathology showed indeterminant subtype with nuclear grade 3. 3) Status post stereotactic radiotherapy on March 03, 2020 to metastatic brain metastasis when the patient developed 2.0 cm mass in the right frontal lobe consistent with recurrent disease. 4) Status post SRS on February 08, 2021 for another isolated left frontal lobe metastasis. 5) palliative radiotherapy to the soft tissue nodule along the superior margin of the nephrectomy bed that was hypermetabolic on the PET scan under the care of Dr. Mitzi Hansen.  CURRENT THERAPY: Observation.  INTERVAL HISTORY: Jacqueline Palmer 78 y.o. female returns to the clinic today for follow-up visit.Discussed the use of AI scribe software for clinical note transcription with the patient, who gave verbal consent to proceed.  History of Present Illness   Jacqueline Palmer is a 78 year old female with stage four papillary renal cell carcinoma who presents for routine follow-up.  She was diagnosed with stage four papillary renal cell carcinoma in January 2021, initially presenting with brain metastasis. She underwent a right frontal craniotomy for tumor resection and received stereotactic radiosurgery to the brain. She has not received any systemic treatment such as chemotherapy or immunotherapy.  Since her last visit in December, she has been doing well  with no major concerns. A recent scan of the chest, abdomen, and back last month did not show any concerning findings. She continues to be monitored with regular scans.  She experienced a fall when a stool slipped from under her, but clarifies it was not due to dizziness or any other medical issue, describing it as a mechanical issue. No dizziness or other symptoms related to her fall.       MEDICAL HISTORY: Past Medical History:  Diagnosis Date   Acute kidney injury (HCC)    Arthritis    Atrial fibrillation (HCC)    Cancer (HCC)    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   Cardiomyopathy [facility])    CHF (congestive heart failure) (HCC)    Chronic a-fib (HCC) 08/14/2016   Chronic anticoagulation 08/14/2016   Coronary artery disease    4v CABG 6/17   Graves disease    Hypertension    Hypothyroidism    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   SN [id number] Model [id number] [From patient's surgical info card]   Presence of permanent cardiac pacemaker    Seizures (HCC)    Thrombus 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 myocardial infarction    Thyroid disease     ALLERGIES:  is allergic to avocado, banana, plantain, ace inhibitors, codeine, prednisone, tape, latex, and pharmabase cosmetic [aquamed].  MEDICATIONS:  Current Outpatient Medications  Medication Sig Dispense Refill   acetaminophen (TYLENOL) 500 MG tablet Take 1,000 mg by mouth daily with breakfast.     colchicine 0.6 MG tablet TAKE ONE TABLET BY MOUTH DAILY AT BEDTIME 90 tablet 3   hydrALAZINE (APRESOLINE) 10 MG tablet Take  1 tablet (10 mg total) by mouth 3 (three) times daily. 270 tablet 4   levETIRAcetam (KEPPRA) 500 MG tablet Take 1 tablet (500 mg total) by mouth 2 (two) times daily. 180 tablet 3   levothyroxine (SYNTHROID) 25 MCG tablet TAKE 1 TABLET BY MOUTH ONCE DAILY IN THE MORNING 30 TO 60 MINUTES BEFORE BREAKFAST ON AN EMPTY STOMACH WITH A FULL GLASS OF WATER 90 tablet 4   metoprolol succinate  (TOPROL-XL) 25 MG 24 hr tablet Take 1 tablet (25 mg total) by mouth daily. 90 tablet 4   nitroGLYCERIN (NITROSTAT) 0.4 MG SL tablet Place 1 tablet (0.4 mg total) under the tongue every 5 (five) minutes as needed for chest pain. 25 tablet 3   OVER THE COUNTER MEDICATION Apply 1 application topically 2 (two) times daily. Pain cream in groin area     polyethylene glycol (MIRALAX / GLYCOLAX) 17 g packet Take 17 g by mouth daily as needed for mild constipation.     pravastatin (PRAVACHOL) 40 MG tablet Take 1 tablet (40 mg total) by mouth daily. 90 tablet 0   spironolactone (ALDACTONE) 25 MG tablet Take 1 tablet (25 mg total) by mouth daily. Please schedule appointment with Dr. Cristal Deer for refills. 90 tablet 4   torsemide (DEMADEX) 20 MG tablet TAKE 2 TABLETS BY MOUTH EVERY MORNING AND 1 TABLET EVERY EVENING 270 tablet 4   warfarin (COUMADIN) 5 MG tablet TAKE ONE TABLET BY MOUTH DAILY IN THE EVENING OR AS DIRECTED BY THE COUMADIN CLINIC 100 tablet 1   No current facility-administered medications for this visit.    SURGICAL HISTORY:  Past Surgical History:  Procedure Laterality Date   ABDOMINAL HYSTERECTOMY     CORONARY ARTERY BYPASS GRAFT     CRANIOTOMY Right 07/30/2019   Right frontal   NEPHRECTOMY Right 10/16/2019   TOTAL HIP ARTHROPLASTY Right 07/11/2021   Procedure: RIGHT TOTAL HIP ARTHROPLASTY ANTERIOR APPROACH;  Surgeon: Gean Birchwood, MD;  Location: [facility];  Service: Orthopedics;  Laterality: Right;    REVIEW OF SYSTEMS:  Constitutional: positive for fatigue Eyes: negative Ears, nose, mouth, throat, and face: negative Respiratory: negative Cardiovascular: negative Gastrointestinal: negative Genitourinary:negative Integument/breast: negative Hematologic/lymphatic: negative Musculoskeletal:negative Neurological: negative Behavioral/Psych: negative Endocrine: negative Allergic/Immunologic: negative   PHYSICAL EXAMINATION: General appearance: alert, cooperative, fatigued, and no  distress Head: Normocephalic, without obvious abnormality, atraumatic Neck: no adenopathy, no JVD, supple, symmetrical, trachea midline, and thyroid not enlarged, symmetric, no tenderness/mass/nodules Lymph nodes: Cervical, supraclavicular, and axillary nodes normal. Resp: clear to auscultation bilaterally Back: symmetric, no curvature. ROM normal. No CVA tenderness. Cardio: regular rate and rhythm, S1, S2 normal, no murmur, click, rub or gallop GI: soft, non-tender; bowel sounds normal; no masses,  no organomegaly Extremities: extremities normal, atraumatic, no cyanosis or edema Neurologic: Alert and oriented X 3, normal strength and tone. Normal symmetric reflexes. Normal coordination and gait  ECOG PERFORMANCE STATUS: 1 - Symptomatic but completely ambulatory  Blood pressure 119/80, pulse 70, temperature (!) 97.2 F (36.2 C), temperature source Temporal, resp. rate 16, height 5\' 8"  (1.727 m), weight 196 lb 4.8 oz (89 kg), SpO2 100%.  LABORATORY DATA: Lab Results  Component Value Date   WBC 5.4 04/09/2023   HGB 12.2 04/09/2023   HCT 37.8 04/09/2023   MCV 74.9 (L) 04/09/2023   PLT 209 04/09/2023      Chemistry      Component Value Date/Time   NA 136 04/09/2023 1111   NA 141 12/14/2020 1053   K 4.6 04/09/2023 1111  CL 103 04/09/2023 1111   CO2 26 04/09/2023 1111   BUN 48 (H) 04/09/2023 1111   BUN 45 (H) 12/14/2020 1053   CREATININE 2.61 (H) 04/09/2023 1111      Component Value Date/Time   CALCIUM 9.7 04/09/2023 1111   ALKPHOS 97 04/09/2023 1111   AST 25 04/09/2023 1111   ALT 18 04/09/2023 1111   BILITOT 0.7 04/09/2023 1111       RADIOGRAPHIC STUDIES: No results found.  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 The patient has the following treatment: 1) status post right frontal  craniotomy and resection of solitary brain metastasis in March 2021 followed by postoperative SRS at [facility]. 2) Status post right radical nephrectomy on Oct 16, 2019 with the final pathologic stage of pT3a, pN0.  The final pathology showed indeterminant subtype with nuclear grade 3. 3) Status post stereotactic radiotherapy on March 03, 2020 to metastatic brain metastasis when the patient developed 2.0 cm mass in the right frontal lobe consistent with recurrent disease. 4) Status post SRS on February 08, 2021 for another isolated left frontal lobe metastasis. 5) palliative radiotherapy to the hypermetabolic soft tissue nodule in the right nephrectomy bed under the care of Dr. Mitzi Hansen The patient has been in observation with no concerning complaints. He had repeat CT scan of the chest, abdomen and pelvis performed recently.  I personally and independently reviewed the scan and discussed the result with the patient today.    Stage IV Papillary Renal Cell Carcinoma with Brain Metastasis Diagnosed January 2021 with stage IV papillary renal cell carcinoma, presenting with brain metastasis. Underwent right frontal craniotomy and tumor resection, followed by SRS. No systemic treatment to date, which is atypical for stage IV cases. Recent scans (January 2025) of chest, abdomen, and back showed no new findings. Reports no major concerns since December 2024, except for a minor fall due to mechanical issues. - Arrange follow-up scan in July 2025 - Schedule follow-up appointment one week post-scan.   The patient was advised to call immediately if she has any other concerning symptoms in the interval. The patient voices understanding of current disease status and treatment options and is in agreement with the current care plan.  All questions were answered. The patient knows to call the clinic with any problems, questions or concerns. We can certainly see the patient much sooner if  necessary.  The total time spent in the appointment was 30 minutes.  Disclaimer: This note was dictated with voice recognition software. Similar sounding words can inadvertently be transcribed and may not be corrected upon review.

## 2023-07-12 ENCOUNTER — Ambulatory Visit: Payer: Medicare Other | Attending: Cardiology | Admitting: *Deleted

## 2023-07-12 DIAGNOSIS — Z5181 Encounter for therapeutic drug level monitoring: Secondary | ICD-10-CM | POA: Diagnosis not present

## 2023-07-12 DIAGNOSIS — I482 Chronic atrial fibrillation, unspecified: Secondary | ICD-10-CM | POA: Insufficient documentation

## 2023-07-12 DIAGNOSIS — Z7901 Long term (current) use of anticoagulants: Secondary | ICD-10-CM | POA: Insufficient documentation

## 2023-07-12 LAB — POCT INR: POC INR: 3

## 2023-07-12 NOTE — Patient Instructions (Signed)
 Description   Continue taking Warfarin 1 tablet daily except 1/2 tablet on Sundays.  Recheck INR in 5 weeks.  Coumadin Clinic [phone]

## 2023-07-31 ENCOUNTER — Telehealth: Payer: Self-pay

## 2023-07-31 ENCOUNTER — Inpatient Hospital Stay: Payer: Medicare Other | Attending: Internal Medicine

## 2023-07-31 ENCOUNTER — Encounter (HOSPITAL_BASED_OUTPATIENT_CLINIC_OR_DEPARTMENT_OTHER): Payer: Self-pay | Admitting: Emergency Medicine

## 2023-07-31 ENCOUNTER — Ambulatory Visit: Payer: Self-pay | Admitting: Internal Medicine

## 2023-07-31 ENCOUNTER — Emergency Department (HOSPITAL_BASED_OUTPATIENT_CLINIC_OR_DEPARTMENT_OTHER)
Admission: EM | Admit: 2023-07-31 | Discharge: 2023-07-31 | Disposition: A | Discharge status: Home / Self Care | Attending: Emergency Medicine | Admitting: Emergency Medicine

## 2023-07-31 ENCOUNTER — Other Ambulatory Visit: Payer: Self-pay

## 2023-07-31 ENCOUNTER — Emergency Department (HOSPITAL_BASED_OUTPATIENT_CLINIC_OR_DEPARTMENT_OTHER)

## 2023-07-31 DIAGNOSIS — Z85528 Personal history of other malignant neoplasm of kidney: Secondary | ICD-10-CM | POA: Insufficient documentation

## 2023-07-31 DIAGNOSIS — Z85841 Personal history of malignant neoplasm of brain: Secondary | ICD-10-CM | POA: Insufficient documentation

## 2023-07-31 DIAGNOSIS — Z7901 Long term (current) use of anticoagulants: Secondary | ICD-10-CM | POA: Insufficient documentation

## 2023-07-31 DIAGNOSIS — Z951 Presence of aortocoronary bypass graft: Secondary | ICD-10-CM | POA: Insufficient documentation

## 2023-07-31 DIAGNOSIS — R112 Nausea with vomiting, unspecified: Secondary | ICD-10-CM | POA: Diagnosis present

## 2023-07-31 DIAGNOSIS — C641 Malignant neoplasm of right kidney, except renal pelvis: Secondary | ICD-10-CM | POA: Insufficient documentation

## 2023-07-31 DIAGNOSIS — I509 Heart failure, unspecified: Secondary | ICD-10-CM | POA: Insufficient documentation

## 2023-07-31 DIAGNOSIS — Z91040 Latex allergy status: Secondary | ICD-10-CM | POA: Diagnosis not present

## 2023-07-31 DIAGNOSIS — I4821 Permanent atrial fibrillation: Secondary | ICD-10-CM | POA: Diagnosis not present

## 2023-07-31 DIAGNOSIS — C7931 Secondary malignant neoplasm of brain: Secondary | ICD-10-CM | POA: Insufficient documentation

## 2023-07-31 DIAGNOSIS — N184 Chronic kidney disease, stage 4 (severe): Secondary | ICD-10-CM | POA: Insufficient documentation

## 2023-07-31 DIAGNOSIS — K529 Noninfective gastroenteritis and colitis, unspecified: Secondary | ICD-10-CM | POA: Diagnosis not present

## 2023-07-31 LAB — CBC WITH DIFFERENTIAL/PLATELET
Abs Immature Granulocytes: 0.02 10*3/uL (ref 0.00–0.07)
Basophils Absolute: 0 10*3/uL (ref 0.0–0.1)
Basophils Relative: 0 %
Eosinophils Absolute: 0 10*3/uL (ref 0.0–0.5)
Eosinophils Relative: 0 %
HCT: 39.6 % (ref 36.0–46.0)
Hemoglobin: 13.1 g/dL (ref 12.0–15.0)
Immature Granulocytes: 0 %
Lymphocytes Relative: 24 %
Lymphs Abs: 1.4 10*3/uL (ref 0.7–4.0)
MCH: 24.3 pg — ABNORMAL LOW (ref 26.0–34.0)
MCHC: 33.1 g/dL (ref 30.0–36.0)
MCV: 73.6 fL — ABNORMAL LOW (ref 80.0–100.0)
Monocytes Absolute: 0.7 10*3/uL (ref 0.1–1.0)
Monocytes Relative: 12 %
Neutro Abs: 3.6 10*3/uL (ref 1.7–7.7)
Neutrophils Relative %: 64 %
Platelets: 227 10*3/uL (ref 150–400)
RBC: 5.38 MIL/uL — ABNORMAL HIGH (ref 3.87–5.11)
RDW: 14.6 % (ref 11.5–15.5)
WBC: 5.8 10*3/uL (ref 4.0–10.5)
nRBC: 0 % (ref 0.0–0.2)

## 2023-07-31 LAB — CMP (CANCER CENTER ONLY)
ALT: 18 U/L (ref 0–44)
AST: 24 U/L (ref 15–41)
Albumin: 4.7 g/dL (ref 3.5–5.0)
Alkaline Phosphatase: 84 U/L (ref 38–126)
Anion gap: 10 (ref 5–15)
BUN: 35 mg/dL — ABNORMAL HIGH (ref 8–23)
CO2: 23 mmol/L (ref 22–32)
Calcium: 9.6 mg/dL (ref 8.9–10.3)
Chloride: 102 mmol/L (ref 98–111)
Creatinine: 2.95 mg/dL — ABNORMAL HIGH (ref 0.44–1.00)
GFR, Estimated: 16 mL/min — ABNORMAL LOW (ref >60)
Glucose, Bld: 97 mg/dL (ref 70–99)
Potassium: 4.1 mmol/L (ref 3.5–5.1)
Sodium: 135 mmol/L (ref 135–145)
Total Bilirubin: 1 mg/dL (ref 0.0–1.2)
Total Protein: 7.9 g/dL (ref 6.5–8.1)

## 2023-07-31 LAB — CBC WITH DIFFERENTIAL (CANCER CENTER ONLY)
Abs Immature Granulocytes: 0.01 10*3/uL (ref 0.00–0.07)
Basophils Absolute: 0.1 10*3/uL (ref 0.0–0.1)
Basophils Relative: 1 %
Eosinophils Absolute: 0.1 10*3/uL (ref 0.0–0.5)
Eosinophils Relative: 2 %
HCT: 39.9 % (ref 36.0–46.0)
Hemoglobin: 13.3 g/dL (ref 12.0–15.0)
Immature Granulocytes: 0 %
Lymphocytes Relative: 31 %
Lymphs Abs: 1.6 10*3/uL (ref 0.7–4.0)
MCH: 24.2 pg — ABNORMAL LOW (ref 26.0–34.0)
MCHC: 33.3 g/dL (ref 30.0–36.0)
MCV: 72.7 fL — ABNORMAL LOW (ref 80.0–100.0)
Monocytes Absolute: 0.8 10*3/uL (ref 0.1–1.0)
Monocytes Relative: 15 %
Neutro Abs: 2.8 10*3/uL (ref 1.7–7.7)
Neutrophils Relative %: 51 %
Platelet Count: 211 10*3/uL (ref 150–400)
RBC: 5.49 MIL/uL — ABNORMAL HIGH (ref 3.87–5.11)
RDW: 14.7 % (ref 11.5–15.5)
WBC Count: 5.4 10*3/uL (ref 4.0–10.5)
nRBC: 0 % (ref 0.0–0.2)

## 2023-07-31 LAB — RESP PANEL BY RT-PCR (RSV, FLU A&B, COVID)  RVPGX2
Influenza A by PCR: NEGATIVE
Influenza B by PCR: NEGATIVE
Resp Syncytial Virus by PCR: NEGATIVE
SARS Coronavirus 2 by RT PCR: NEGATIVE

## 2023-07-31 LAB — COMPREHENSIVE METABOLIC PANEL
ALT: 16 U/L (ref 0–44)
AST: 22 U/L (ref 15–41)
Albumin: 4.6 g/dL (ref 3.5–5.0)
Alkaline Phosphatase: 75 U/L (ref 38–126)
Anion gap: 12 (ref 5–15)
BUN: 32 mg/dL — ABNORMAL HIGH (ref 8–23)
CO2: 21 mmol/L — ABNORMAL LOW (ref 22–32)
Calcium: 9.4 mg/dL (ref 8.9–10.3)
Chloride: 102 mmol/L (ref 98–111)
Creatinine, Ser: 2.62 mg/dL — ABNORMAL HIGH (ref 0.44–1.00)
GFR, Estimated: 18 mL/min — ABNORMAL LOW (ref >60)
Glucose, Bld: 81 mg/dL (ref 70–99)
Potassium: 4.2 mmol/L (ref 3.5–5.1)
Sodium: 135 mmol/L (ref 135–145)
Total Bilirubin: 1 mg/dL (ref 0.0–1.2)
Total Protein: 7.4 g/dL (ref 6.5–8.1)

## 2023-07-31 LAB — URINALYSIS, ROUTINE W REFLEX MICROSCOPIC
Bilirubin Urine: NEGATIVE
Glucose, UA: NEGATIVE mg/dL
Hgb urine dipstick: NEGATIVE
Ketones, ur: NEGATIVE mg/dL
Leukocytes,Ua: NEGATIVE
Nitrite: NEGATIVE
Protein, ur: NEGATIVE mg/dL
Specific Gravity, Urine: 1.007 (ref 1.005–1.030)
pH: 5.5 (ref 5.0–8.0)

## 2023-07-31 LAB — LIPASE, BLOOD: Lipase: 65 U/L — ABNORMAL HIGH (ref 11–51)

## 2023-07-31 LAB — MAGNESIUM: Magnesium: 2 mg/dL (ref 1.7–2.4)

## 2023-07-31 MED ORDER — ONDANSETRON HCL 4 MG/2ML IJ SOLN
4.0000 mg | Freq: Once | INTRAMUSCULAR | Status: AC
  Administered 2023-07-31: 4 mg via INTRAVENOUS
  Filled 2023-07-31: qty 2

## 2023-07-31 MED ORDER — LACTATED RINGERS IV BOLUS
1000.0000 mL | Freq: Once | INTRAVENOUS | Status: AC
  Administered 2023-07-31: 1000 mL via INTRAVENOUS

## 2023-07-31 MED ORDER — ONDANSETRON 4 MG PO TBDP
4.0000 mg | ORAL_TABLET | Freq: Three times a day (TID) | ORAL | 0 refills | Status: DC | PRN
Start: 2023-07-31 — End: 2023-08-22

## 2023-07-31 NOTE — ED Notes (Signed)
 Pt aware of need for urine sample...wants to try later.

## 2023-07-31 NOTE — Telephone Encounter (Signed)
**Note De-identified  [place] Obfuscation** Please advise 

## 2023-07-31 NOTE — Telephone Encounter (Signed)
 Chief Complaint: Nausea/vomiting Symptoms: Nausea/Vomiting, weakness, dizziness Frequency: since last Tuesday Pertinent Negatives: Patient denies fever Disposition: [x] ED /[] Urgent Care (no appt availability in office) / [] Appointment(In office/virtual)/ []  [facility] [facility]/ [] Home Care/ [x] Refused Recommended Disposition /[] [facility]/ []  Follow-up with PCP Additional Notes: Patient called in stating she has been having nausea and vomiting since last Tuesday, and has become dizzy and weak as of this morning. Patient states there is no odd appearance to her emesis. Patient denies shortness of breath. Patient advised to go to ED for evaluation and rehydration. Patient states she will go to Urgent Care now and if they advise her to go to ED, she will. Patient states she would like to start with UC due to viruses and ill people at ED. Advised patient to follow up with PCP if suggested.   Copied from CRM [id number]. Topic: Clinical - Red Word Triage >> Jul 31, 2023  1:29 PM Martha Clan wrote: Red Word that prompted transfer to Nurse Triage: Patient has Nausea for over a week. Cannot eat, is feeling weak, and dizzy. Reason for Disposition  Unable to walk, or can only walk with assistance (e.g., requires support)  Answer Assessment - Initial Assessment Questions 1. NAUSEA SEVERITY: "How bad is the nausea?" (e.g., mild, moderate, severe; dehydration, weight loss)   - MILD: loss of appetite without change in eating habits   - MODERATE: decreased oral intake without significant weight loss, dehydration, or malnutrition   - SEVERE: inadequate caloric or fluid intake, significant weight loss, symptoms of dehydration     Severe  2. ONSET: "When did the nausea begin?"     Last Tuesday 3. VOMITING: "Any vomiting?" If Yes, ask: "How many times today?"     Yes - twice since 10 am 4. RECURRENT SYMPTOM: "Have you had nausea before?" If Yes, ask: "When was the last time?" "What happened that  time?"     Yes 5. CAUSE: "What do you think is causing the nausea?"     Unknown cause  Protocols used: Nausea-A-AH

## 2023-07-31 NOTE — ED Triage Notes (Signed)
 Pt bib wheelchair, c/o n/v x 1 week. Endorses feeling light-headed since yesterday. Cancer pt. Pt aox4, neg for weakness

## 2023-07-31 NOTE — Telephone Encounter (Deleted)
 Copied from CRM [id number]. Topic: Clinical - Medical Advice >> Jul 31, 2023 11:00 AM Shon Hale wrote: Reason for CRM: Patient has been having nausea for about 4 days now. Patient requesting rx for nausea to be sent in.   Patient has taken zofran in the past.

## 2023-07-31 NOTE — ED Provider Notes (Signed)
 [facility] Provider Note   CSN: [id number] Arrival date & time: 07/31/23  1424     History Chief Complaint  Patient presents with   Emesis    Jacqueline Palmer is a 78 y.o. female.  Patient past history significant for renal cell carcinoma, malignant neoplasm metastatic to the brain, CHF, history of CABG, permanent atrial fibrillation here with concerns of vomiting.  States that she has been experiencing nausea and vomiting off and on for the last week or so.  No clear contacts as far she is aware of but states that she was at the retirement community with everyone around her being sick.  She denies any feelings of chest pain, lightheadedness, dizziness, or weakness.  States that she feels that she is vomiting every 1 and half hours or so.  Denies any hematemesis.   Emesis      Home Medications Prior to Admission medications   Medication Sig Start Date End Date Taking? Authorizing Provider  ondansetron (ZOFRAN-ODT) 4 MG disintegrating tablet Take 1 tablet (4 mg total) by mouth every 8 (eight) hours as needed for nausea or vomiting. 07/31/23  Yes Smitty Knudsen, PA-C  acetaminophen (TYLENOL) 500 MG tablet Take 1,000 mg by mouth daily with breakfast.    [provider]  colchicine 0.6 MG tablet TAKE ONE TABLET BY MOUTH DAILY AT BEDTIME 10/10/22   Marcine Matar, MD  hydrALAZINE (APRESOLINE) 10 MG tablet Take 1 tablet (10 mg total) by mouth 3 (three) times daily. 12/14/22   Marcine Matar, MD  levETIRAcetam (KEPPRA) 500 MG tablet Take 1 tablet (500 mg total) by mouth 2 (two) times daily. 06/27/23 06/21/24  Lomax, Amy, NP  levothyroxine (SYNTHROID) 25 MCG tablet TAKE 1 TABLET BY MOUTH ONCE DAILY IN THE MORNING 30 TO 60 MINUTES BEFORE BREAKFAST ON AN EMPTY STOMACH WITH A FULL GLASS OF WATER 12/14/22   Marcine Matar, MD  metoprolol succinate (TOPROL-XL) 25 MG 24 hr tablet Take 1 tablet (25 mg total) by mouth daily. 12/14/22   Marcine Matar, MD  nitroGLYCERIN (NITROSTAT) 0.4 MG SL tablet Place 1 tablet (0.4 mg total) under the tongue every 5 (five) minutes as needed for chest pain. 11/21/22   Alver Sorrow, NP  OVER THE COUNTER MEDICATION Apply 1 application topically 2 (two) times daily. Pain cream in groin area    [provider]  polyethylene glycol (MIRALAX / GLYCOLAX) 17 g packet Take 17 g by mouth daily as needed for mild constipation.    [provider]  pravastatin (PRAVACHOL) 40 MG tablet Take 1 tablet (40 mg total) by mouth daily. 06/29/23   Marcine Matar, MD  spironolactone (ALDACTONE) 25 MG tablet Take 1 tablet (25 mg total) by mouth daily. Please schedule appointment with Dr. Cristal Deer for refills. 12/14/22   Marcine Matar, MD  torsemide (DEMADEX) 20 MG tablet TAKE 2 TABLETS BY MOUTH EVERY MORNING AND 1 TABLET EVERY EVENING 12/14/22   Marcine Matar, MD  warfarin (COUMADIN) 5 MG tablet TAKE ONE TABLET BY MOUTH DAILY IN THE EVENING OR AS DIRECTED BY THE COUMADIN CLINIC 03/06/23   Jodelle Red, MD      Allergies    Avocado, Banana, Plantain, Ace inhibitors, Codeine, Prednisone, Tape, Latex, and Pharmabase cosmetic [aquamed]    Review of Systems   Review of Systems  Gastrointestinal:  Positive for vomiting.  All other systems reviewed and are negative.   Physical Exam Updated Vital Signs  BP 128/81   Pulse 71   Temp 98.5 F (36.9 C)   Resp 16   Wt 85.7 kg   SpO2 100%   BMI 28.74 kg/m  Physical Exam Vitals and nursing note reviewed.  Constitutional:      General: She is not in acute distress.    Appearance: She is well-developed.  HENT:     Head: Normocephalic and atraumatic.  Eyes:     Conjunctiva/sclera: Conjunctivae normal.  Cardiovascular:     Rate and Rhythm: Normal rate and regular rhythm.     Heart sounds: No murmur heard. Pulmonary:     Effort: Pulmonary effort is normal. No respiratory distress.     Breath sounds: Normal breath sounds.   Abdominal:     General: Abdomen is flat. Bowel sounds are normal. There is no distension.     Palpations: Abdomen is soft.     Tenderness: There is no abdominal tenderness. There is no guarding.  Musculoskeletal:        General: No swelling.     Cervical back: Neck supple.  Skin:    General: Skin is warm and dry.     Capillary Refill: Capillary refill takes less than 2 seconds.  Neurological:     Mental Status: She is alert.  Psychiatric:        Mood and Affect: Mood normal.     ED Results / Procedures / Treatments   Labs (all labs ordered are listed, but only abnormal results are displayed) Labs Reviewed  CBC WITH DIFFERENTIAL/PLATELET - Abnormal; Notable for the following components:      Result Value   RBC 5.38 (*)    MCV 73.6 (*)    MCH 24.3 (*)    All other components within normal limits  URINALYSIS, ROUTINE W REFLEX MICROSCOPIC - Abnormal; Notable for the following components:   Color, Urine COLORLESS (*)    All other components within normal limits  COMPREHENSIVE METABOLIC PANEL - Abnormal; Notable for the following components:   CO2 21 (*)    BUN 32 (*)    Creatinine, Ser 2.62 (*)    GFR, Estimated 18 (*)    All other components within normal limits  LIPASE, BLOOD - Abnormal; Notable for the following components:   Lipase 65 (*)    All other components within normal limits  RESP PANEL BY RT-PCR (RSV, FLU A&B, COVID)  RVPGX2  MAGNESIUM    EKG EKG Interpretation Date/Time:  Tuesday July 31 2023 14:44:26 EDT Ventricular Rate:  70 PR Interval:    QRS Duration:  134 QT Interval:  490 QTC Calculation: 529 R Axis:   79  Text Interpretation: Ventricular-paced rhythm Biventricular pacemaker detected When compared with ECG of 02-Dec-2020 16:44, No significant change was found Confirmed by Gwyneth Sprout ([id number]) on 07/31/2023 4:11:51 PM  Radiology DG Chest Portable 1 View Result Date: 07/31/2023 CLINICAL DATA:  Weakness EXAM: PORTABLE CHEST 1 VIEW  COMPARISON:  Chest x-ray 12/02/2020 FINDINGS: Left-sided ICD, sternotomy wires and prosthetic heart valve are again seen. The heart is enlarged, unchanged. The lungs are clear. There is no pleural effusion or pneumothorax. No acute fractures are seen. IMPRESSION: 1. No active disease. 2. Cardiomegaly. Electronically Signed   By: Darliss Cheney M.D.   On: 07/31/2023 18:20    Procedures Procedures    Medications Ordered in ED Medications  lactated ringers bolus 1,000 mL (0 mLs Intravenous Stopped 07/31/23 1800)  ondansetron (ZOFRAN) injection 4 mg (4 mg Intravenous Given 07/31/23 1548)  ED Course/ Medical Decision Making/ A&P                                 Medical Decision Making Amount and/or Complexity of Data Reviewed Labs: ordered. Radiology: ordered.  Risk Prescription drug management.   This patient presents to the ED for concern of vomiting. Differential diagnosis includes gastroenteritis, bowel obstruction, dehydration, constipation, viral URI   Lab Tests:  I Ordered, and personally interpreted labs.  The pertinent results include: CBC unremarkable, CMP baseline renal function, lipase slightly elevated at 65, magnesium normal at 2.0, respiratory panel normal, UA without signs of infection   Imaging Studies ordered:  I ordered imaging studies including chest x-ray I independently visualized and interpreted imaging which showed no active cardiopulmonary process I agree with the radiologist interpretation   Medicines ordered and prescription drug management:  I ordered medication including fluids, Zofran for dehydration, nausea Reevaluation of the patient after these medicines showed that the patient improved I have reviewed the patients home medicines and have made adjustments as needed   Problem List / ED Course:  Patient with past history significant for renal cell carcinoma, malignant neoplasm metastatic to the brain, CHF, history of CABG, permanent atrial  fibrillation with concerns of vomiting.  Reports numerous sick contacts at her current retirement facility.  She states that she has not had any hematemesis hematochezia.  Denies any significant diarrhea but does have some slightly looser bowel movements. Sickle exam is reassuring.  Normal bowel sounds and no tenderness to palpation.  Capillary refill is unremarkable with color returning less than 2 seconds.  Will workup with labs for assessment of hydration status given numerous episodes of vomiting that she is reporting. Lab workup is reassuring without any acute finding seen.  Lipase slightly elevated likely reactive to vomiting not indicating pancreatitis as patient has no epigastric tenderness.  UA with no evidence of infection.  Respiratory panel negative.  Imaging deferred at this time given no focal abdominal tenderness.  Given reassuring workup and patient's response to Zofran and fluids, she is stable for outpatient follow-up and discharged home.  A refill of Zofran has been sent to her pharmacy.  Advised patient to take this medication as prescribed.  Precautions discussed.  Patient otherwise stable and discharged home with plans for outpatient follow-up  Final Clinical Impression(s) / ED Diagnoses Final diagnoses:  Gastroenteritis  CKD (chronic kidney disease), stage IV (HCC)    Rx / DC Orders ED Discharge Orders          Ordered    ondansetron (ZOFRAN-ODT) 4 MG disintegrating tablet  Every 8 hours PRN        07/31/23 1900              Smitty Knudsen, PA-C 07/31/23 1917    Gwyneth Sprout, MD 08/01/23 2316

## 2023-07-31 NOTE — Discharge Instructions (Addendum)
 You are seen in the emergency department today for concerns of vomiting.  Your labs were thankfully reassuring with no severe signs of dehydration.  You appear to have responded well to Zofran.  I sent a prescription for this medication to your pharmacy to continue using at home.  If you would have any new or worsening symptoms, return the emergency department.  Try to hydrate as best you can over the next few days.  Please follow-up with your primary care provider.

## 2023-07-31 NOTE — Telephone Encounter (Signed)
 Copied from CRM [id number]. Topic: Clinical - Medical Advice >> Jul 31, 2023  1:27 PM Martha Clan wrote: Reason for CRM: Patient is experiencing nausea for 4 days. Is requesting a prescription or some sort advise to move forward.

## 2023-08-01 NOTE — Telephone Encounter (Signed)
 Patient seen in the emergency room for this yesterday and was prescribed Zofran for the nausea.

## 2023-08-08 ENCOUNTER — Telehealth: Payer: Self-pay | Admitting: *Deleted

## 2023-08-08 NOTE — Telephone Encounter (Signed)
 Spoke to the patient, Verified name & DOB. Patient is scheduled for a hospital follow up.

## 2023-08-08 NOTE — Telephone Encounter (Signed)
 Copied from CRM [id number]. Topic: Appointments - Appointment Scheduling >> Aug 07, 2023  4:01 PM Priscille Loveless wrote: Patient needs to make a follow up appointment after being seen in urgent care. I tried to make the appt but it was booked out until April. Please call this patient back and set up appt. I did try to call twice but it went to v/m at the time of the call.

## 2023-08-09 ENCOUNTER — Telehealth: Payer: Self-pay | Admitting: *Deleted

## 2023-08-09 NOTE — Telephone Encounter (Signed)
 Updated therapy plan from [facility] signed by Dr Barbaraann Cao & faxed to [phone].  Fax confirmation received.

## 2023-08-16 ENCOUNTER — Ambulatory Visit: Payer: Medicare Other | Attending: Cardiology | Admitting: *Deleted

## 2023-08-16 DIAGNOSIS — I482 Chronic atrial fibrillation, unspecified: Secondary | ICD-10-CM | POA: Diagnosis present

## 2023-08-16 DIAGNOSIS — Z7901 Long term (current) use of anticoagulants: Secondary | ICD-10-CM | POA: Insufficient documentation

## 2023-08-16 LAB — POCT INR: POC INR: 2.1

## 2023-08-16 NOTE — Patient Instructions (Addendum)
 Description   Continue taking Warfarin 1 tablet daily except 1/2 tablet on Sundays.  Recheck INR in 6 weeks.  Coumadin Clinic [phone]      1st Floor: - Lobby - Registration  - Pharmacy  - Lab - Cafe  2nd Floor: - PV Lab - Diagnostic Testing (echo, CT, nuclear med)  3rd Floor: - Vacant  4th Floor: - TCTS (cardiothoracic surgery) - AFib Clinic - Structural Heart Clinic - Vascular Surgery  - Vascular Ultrasound  5th Floor: - [facility] (general and EP) - Clinical Pharmacy for coumadin, hypertension, lipid, weight-loss medications, and med management appointments    Valet parking services will be available as well.

## 2023-08-22 ENCOUNTER — Ambulatory Visit: Attending: Physician Assistant | Admitting: Physician Assistant

## 2023-08-22 VITALS — BP 118/78 | HR 69 | Temp 97.8°F | Ht 68.0 in | Wt 182.0 lb

## 2023-08-22 DIAGNOSIS — I2510 Atherosclerotic heart disease of native coronary artery without angina pectoris: Secondary | ICD-10-CM

## 2023-08-22 DIAGNOSIS — R110 Nausea: Secondary | ICD-10-CM | POA: Diagnosis not present

## 2023-08-22 DIAGNOSIS — E890 Postprocedural hypothyroidism: Secondary | ICD-10-CM

## 2023-08-22 DIAGNOSIS — I10 Essential (primary) hypertension: Secondary | ICD-10-CM

## 2023-08-22 DIAGNOSIS — Z79899 Other long term (current) drug therapy: Secondary | ICD-10-CM | POA: Diagnosis not present

## 2023-08-22 DIAGNOSIS — C7931 Secondary malignant neoplasm of brain: Secondary | ICD-10-CM

## 2023-08-22 DIAGNOSIS — Z09 Encounter for follow-up examination after completed treatment for conditions other than malignant neoplasm: Secondary | ICD-10-CM | POA: Diagnosis present

## 2023-08-22 DIAGNOSIS — M62838 Other muscle spasm: Secondary | ICD-10-CM

## 2023-08-22 DIAGNOSIS — R569 Unspecified convulsions: Secondary | ICD-10-CM | POA: Diagnosis not present

## 2023-08-22 MED ORDER — LEVETIRACETAM 500 MG PO TABS: 500.0000 mg | ORAL_TABLET | Freq: Two times a day (BID) | ORAL | 3 refills | Status: AC

## 2023-08-22 MED ORDER — COLCHICINE 0.6 MG PO TABS: 0.6000 mg | ORAL_TABLET | Freq: Every day | ORAL | 3 refills | Status: AC

## 2023-08-22 MED ORDER — ONDANSETRON 4 MG PO TBDP: 4.0000 mg | ORAL_TABLET | Freq: Three times a day (TID) | ORAL | 0 refills | Status: AC | PRN

## 2023-08-22 MED ORDER — METOPROLOL SUCCINATE ER 25 MG PO TB24: 25.0000 mg | ORAL_TABLET | Freq: Every day | ORAL | 4 refills | Status: AC

## 2023-08-22 MED ORDER — METHOCARBAMOL 500 MG PO TABS: 1000.0000 mg | ORAL_TABLET | Freq: Three times a day (TID) | ORAL | 0 refills | Status: AC | PRN

## 2023-08-22 MED ORDER — LEVOTHYROXINE SODIUM 25 MCG PO TABS: ORAL_TABLET | ORAL | 4 refills | Status: AC

## 2023-08-22 MED ORDER — HYDRALAZINE HCL 10 MG PO TABS: 10.0000 mg | ORAL_TABLET | Freq: Three times a day (TID) | ORAL | 4 refills | Status: AC

## 2023-08-22 MED ORDER — PRAVASTATIN SODIUM 40 MG PO TABS: 40.0000 mg | ORAL_TABLET | Freq: Every day | ORAL | 0 refills | Status: DC

## 2023-08-22 NOTE — Progress Notes (Signed)
 Patient ID: Jacqueline Palmer, female   DOB: 1945-06-04, 78 y.o.   MRN: [id number]   Jacqueline Palmer, is a 78 y.o. female  MWN:[id number]  QIH:[id number]  DOB - 1946-02-19  Chief Complaint  Patient presents with   Follow-up    ER f/u.  No questions / concerns        Subjective:   Jacqueline Palmer is a 78 y.o. female here today for ED follow up after being seen 07/31/2023 for gastroenteritis.  She has been feeling fine since then.  Appetite is back to baseline.  Bowels moving normally.  She does have some muscle tension in her neck that she uses biofreeze for at times from PT since her brain surgery.  She does need RF on some of her meds.  Sees nephrology in early May.   No problems updated.  ALLERGIES: Allergies  Allergen Reactions   Avocado Shortness Of Breath    Before 2007   Banana Anaphylaxis and Swelling   Plantain Anaphylaxis and Swelling   Ace Inhibitors Cough   Codeine Other (See Comments)    Nausea and Feels Jittery   Prednisone Swelling    Of face and lower extremities   Tape Hives    Tape  Does better with paper tape    Latex Rash   Pharmabase Cosmetic [Aquamed] Swelling and Rash    Some Soap, perfumes,detergent    PAST MEDICAL HISTORY: Past Medical History:  Diagnosis Date   Acute kidney injury (HCC)    Arthritis    Atrial fibrillation (HCC)    Cancer (HCC)    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   Cardiomyopathy (HCC)    CHF (congestive heart failure) (HCC)    Chronic a-fib (HCC) 08/14/2016   Chronic anticoagulation 08/14/2016   Coronary artery disease    4v CABG 6/17   Graves disease    Hypertension    Hypothyroidism    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   SN [id number] Model [id number] [From patient's surgical info card]   Presence of permanent cardiac pacemaker    Seizures (HCC)    Thrombus 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 myocardial infarction    Thyroid disease     MEDICATIONS AT HOME: Prior to Admission medications    Medication Sig Start Date End Date Taking? Authorizing Provider  acetaminophen (TYLENOL) 500 MG tablet Take 1,000 mg by mouth daily with breakfast.   Yes [provider]  methocarbamol (ROBAXIN) 500 MG tablet Take 2 tablets (1,000 mg total) by mouth every 8 (eight) hours as needed. For muscle spasm 08/22/23  Yes Anders Simmonds, PA-C  nitroGLYCERIN (NITROSTAT) 0.4 MG SL tablet Place 1 tablet (0.4 mg total) under the tongue every 5 (five) minutes as needed for chest pain. 11/21/22  Yes Alver Sorrow, NP  OVER THE COUNTER MEDICATION Apply 1 application topically 2 (two) times daily. Pain cream in groin area   Yes [provider]  polyethylene glycol (MIRALAX / GLYCOLAX) 17 g packet Take 17 g by mouth daily as needed for mild constipation.   Yes [provider]  spironolactone (ALDACTONE) 25 MG tablet Take 1 tablet (25 mg total) by mouth daily. Please schedule appointment with Dr. Cristal Deer for refills. 12/14/22  Yes Marcine Matar, MD  torsemide (DEMADEX) 20 MG tablet TAKE 2 TABLETS BY MOUTH EVERY MORNING AND 1 TABLET EVERY EVENING 12/14/22  Yes Marcine Matar, MD  warfarin (COUMADIN) 5 MG tablet TAKE ONE TABLET BY MOUTH DAILY IN  THE EVENING OR AS DIRECTED BY THE COUMADIN CLINIC 03/06/23  Yes Jodelle Red, MD  colchicine 0.6 MG tablet Take 1 tablet (0.6 mg total) by mouth at bedtime. 08/22/23   Anders Simmonds, PA-C  hydrALAZINE (APRESOLINE) 10 MG tablet Take 1 tablet (10 mg total) by mouth 3 (three) times daily. 08/22/23   Anders Simmonds, PA-C  levETIRAcetam (KEPPRA) 500 MG tablet Take 1 tablet (500 mg total) by mouth 2 (two) times daily. 08/22/23 08/16/24  Anders Simmonds, PA-C  levothyroxine (SYNTHROID) 25 MCG tablet TAKE 1 TABLET BY MOUTH ONCE DAILY IN THE MORNING 30 TO 60 MINUTES BEFORE BREAKFAST ON AN EMPTY STOMACH WITH A FULL GLASS OF WATER 08/22/23   Anders Simmonds, PA-C  metoprolol succinate (TOPROL-XL) 25 MG 24 hr tablet Take 1 tablet (25 mg  total) by mouth daily. 08/22/23   Anders Simmonds, PA-C  ondansetron (ZOFRAN-ODT) 4 MG disintegrating tablet Take 1 tablet (4 mg total) by mouth every 8 (eight) hours as needed for nausea or vomiting. 08/22/23   Anders Simmonds, PA-C  pravastatin (PRAVACHOL) 40 MG tablet Take 1 tablet (40 mg total) by mouth daily. 08/22/23   Jacqueline Palmer, Jacqueline Schlein, PA-C    ROS: Neg HEENT Neg resp Neg cardiac Neg GI Neg GU Neg MS Neg psych Neg neuro  Objective:   Vitals:   08/22/23 1336  BP: 118/78  Pulse: 69  Temp: 97.8 F (36.6 C)  TempSrc: Oral  SpO2: 100%  Weight: 182 lb (82.6 kg)  Height: 5\' 8"  (1.727 m)   Exam General appearance : Awake, alert, not in any distress. Speech Clear. Not toxic looking HEENT: Atraumatic and Normocephalic Neck: Supple, no JVD. No cervical lymphadenopathy. Muscle spasm B trapezius into nexk Chest: Good air entry bilaterally, CTAB.  No rales/rhonchi/wheezing CVS: S1 S2 regular, no murmurs.  Extremities: B/L Lower Ext shows no edema, both legs are warm to touch Neurology: Awake alert, and oriented X 3, CN II-XII intact, Non focal Skin: No Rash  Data Review No results found for: "HGBA1C"  Assessment & Plan   1. Coronary artery disease involving native coronary artery of native heart without angina pectoris - pravastatin (PRAVACHOL) 40 MG tablet; Take 1 tablet (40 mg total) by mouth daily.  Dispense: 90 tablet; Refill: 0  2. Essential hypertension Controlled.  Reviewed recent labs - metoprolol succinate (TOPROL-XL) 25 MG 24 hr tablet; Take 1 tablet (25 mg total) by mouth daily.  Dispense: 90 tablet; Refill: 4 - hydrALAZINE (APRESOLINE) 10 MG tablet; Take 1 tablet (10 mg total) by mouth 3 (three) times daily.  Dispense: 270 tablet; Refill: 4  3. Postablative hypothyroidism TSH NL in Jan 2025 - levothyroxine (SYNTHROID) 25 MCG tablet; TAKE 1 TABLET BY MOUTH ONCE DAILY IN THE MORNING 30 TO 60 MINUTES BEFORE BREAKFAST ON AN EMPTY STOMACH WITH A FULL GLASS OF WATER   Dispense: 90 tablet; Refill: 4   spasm - methocarbamol (ROBAXIN) 500 MG tablet; Take 2 tablets (1,000 mg total) by mouth every 8 (eight) hours as needed. For muscle spasm  Dispense: 90 tablet; Refill: 0  6. Nausea  - ondansetron (ZOFRAN-ODT) 4 MG disintegrating tablet; Take 1 tablet (4 mg total) by mouth every 8 (eight) hours as needed for nausea or vomiting.  Dispense: 20 tablet; Refill: 0  7. Malignant neoplasm metastatic to brain (HCC) - levETIRAcetam (KEPPRA) 500 MG tablet; Take 1 tablet (500 mg total) by mouth 2 (two) times daily.  Dispense: 180 tablet; Refill: 3  8. Seizures (  HCC) - levETIRAcetam (KEPPRA) 500 MG tablet; Take 1 tablet (500 mg total) by mouth 2 (two) times daily.  Dispense: 180 tablet; Refill: 3    Return in about 3 months (around 11/21/2023) for PCP for chronic conditions.  The patient was given clear instructions to go to ER or return to medical center if symptoms don't improve, worsen or new problems develop. The patient verbalized understanding. The patient was told to call to get lab results if they haven't heard anything in the next week.      Georgian Co, PA-C [facility] [place], [place] [phone]   08/22/2023, 1:58 PM

## 2023-08-30 ENCOUNTER — Ambulatory Visit (INDEPENDENT_AMBULATORY_CARE_PROVIDER_SITE_OTHER): Payer: Medicare Other

## 2023-08-30 DIAGNOSIS — I428 Other cardiomyopathies: Secondary | ICD-10-CM

## 2023-09-02 LAB — CUP PACEART REMOTE DEVICE CHECK
Battery Remaining Longevity: 24 mo
Battery Remaining Percentage: 27 %
Brady Statistic RA Percent Paced: 0 %
Brady Statistic RV Percent Paced: 99 %
Date Time Interrogation Session: 20250411131300
HighPow Impedance: 76 Ohm
Implantable Lead Connection Status: 753985
Implantable Lead Connection Status: 753985
Implantable Lead Connection Status: 753985
Implantable Lead Implant Date: 20180423
Implantable Lead Implant Date: 20180423
Implantable Lead Implant Date: 20180423
Implantable Lead Location: 753858
Implantable Lead Location: 753859
Implantable Lead Location: 753860
Implantable Lead Model: 292
Implantable Lead Model: 4672
Implantable Lead Model: 7740
Implantable Lead Serial Number: 431793
Implantable Lead Serial Number: 602825
Implantable Lead Serial Number: 703305
Implantable Pulse Generator Implant Date: 20180423
Lead Channel Impedance Value: 378 Ohm
Lead Channel Impedance Value: 463 Ohm
Lead Channel Impedance Value: 985 Ohm
Lead Channel Pacing Threshold Amplitude: 0.5 V
Lead Channel Pacing Threshold Amplitude: 0.5 V
Lead Channel Pacing Threshold Pulse Width: 0.4 ms
Lead Channel Pacing Threshold Pulse Width: 0.4 ms
Lead Channel Setting Pacing Amplitude: 1.6 V
Lead Channel Setting Pacing Amplitude: 2 V
Lead Channel Setting Pacing Pulse Width: 0.4 ms
Lead Channel Setting Pacing Pulse Width: 0.4 ms
Lead Channel Setting Sensing Sensitivity: 0.6 mV
Lead Channel Setting Sensing Sensitivity: 1 mV
Pulse Gen Serial Number: 174046
Zone Setting Status: 755011

## 2023-09-04 ENCOUNTER — Encounter: Payer: Self-pay | Admitting: Cardiology

## 2023-09-12 NOTE — CV Procedure (Signed)
  Device system confirmed to be MRI conditional, with implant date > 6 weeks ago, and no evidence of abandoned or epicardial leads in review of most recent CXR  Device last cleared by EP Provider: Valeri Gate; 09/12/23  Clearance is good through for 1 year as long as parameters remain stable at time of check. If pt undergoes a cardiac device procedure during that time, they should be re-cleared.   Tachy-therapies to be programmed off if applicable with device back to pre-MRI settings after completion of exam.  [employer] was available remotely to assist in programming recommendations.   Arlys Lamer, RT  09/12/2023 11:58 AM

## 2023-09-13 ENCOUNTER — Telehealth: Payer: Self-pay | Admitting: *Deleted

## 2023-09-13 ENCOUNTER — Ambulatory Visit: Payer: Medicare Other | Admitting: Internal Medicine

## 2023-09-13 NOTE — Telephone Encounter (Signed)
 PT Recert request received from [facility] for this patient, signed by Dr Mark Sil & returned by fax to [phone].  Fax confirmation received.

## 2023-09-14 ENCOUNTER — Other Ambulatory Visit: Payer: Self-pay | Admitting: Cardiology

## 2023-09-14 DIAGNOSIS — I482 Chronic atrial fibrillation, unspecified: Secondary | ICD-10-CM

## 2023-09-17 ENCOUNTER — Ambulatory Visit (HOSPITAL_COMMUNITY)
Admission: RE | Admit: 2023-09-17 | Discharge: 2023-09-17 | Disposition: A | Discharge status: Home / Self Care | Source: Ambulatory Visit | Attending: Internal Medicine | Admitting: Internal Medicine

## 2023-09-17 DIAGNOSIS — C7931 Secondary malignant neoplasm of brain: Secondary | ICD-10-CM | POA: Diagnosis present

## 2023-09-17 MED ORDER — GADOBUTROL 1 MMOL/ML IV SOLN
8.0000 mL | Freq: Once | INTRAVENOUS | Status: AC | PRN
  Administered 2023-09-17: 8 mL via INTRAVENOUS

## 2023-09-17 NOTE — Telephone Encounter (Signed)
 Refill request for warfarin:  Last INR was 2.1 on 08/16/23 Next INR due 09/27/23 LOV was 01/25/23  Refill approved.

## 2023-09-20 ENCOUNTER — Inpatient Hospital Stay: Attending: Internal Medicine | Admitting: Internal Medicine

## 2023-09-20 VITALS — BP 107/72 | HR 70 | Temp 97.9°F | Resp 18 | Wt 186.9 lb

## 2023-09-20 DIAGNOSIS — Z87891 Personal history of nicotine dependence: Secondary | ICD-10-CM | POA: Insufficient documentation

## 2023-09-20 DIAGNOSIS — C7931 Secondary malignant neoplasm of brain: Secondary | ICD-10-CM | POA: Insufficient documentation

## 2023-09-20 DIAGNOSIS — C641 Malignant neoplasm of right kidney, except renal pelvis: Secondary | ICD-10-CM | POA: Insufficient documentation

## 2023-09-20 DIAGNOSIS — C7951 Secondary malignant neoplasm of bone: Secondary | ICD-10-CM | POA: Insufficient documentation

## 2023-09-20 NOTE — Progress Notes (Signed)
 [facility] [address]  [place], [place] [address] [phone]   Interval Evaluation  Date of Service: 09/20/23 Patient Name: Jacqueline Palmer Patient MRN: [id number] Patient DOB: Oct 01, 1945 Provider: Mamie Searles, MD  Identifying Statement:  Jacqueline Palmer is a 78 y.o. female with Malignant neoplasm metastatic to brain [facility]) [C79.31]   Primary Cancer: Renal Cell Carcinoma, Stage IV  Oncologic History: 07/30/19: Right frontal craniotomy, resection of solitary brain metastasis at [facility] 10/06/19: Post-operative SRS at [facility] (Dr. Inga Manges) 03/09/20: Salvage SRS to R frontal+LMD 27/3x 02/08/21: Salvage SRS to L frontal Jeryl Moris)  Interval History:  Jacqueline Palmer presents today for follow up after recent MRI brain.  No new or progressive neurologic symptoms. No recent seizures, continues to take Keppra  500mg  twice per day.  Continues to be active mentally and engaging socially.  H+P (02/24/20) Patient presents to review recent changes on brain MRI.  She initially presented in March 2021 with a seizure, described as "left side clenching up, lost awareness for a few minutes".  CNS imaging demonstrated an enhancing right frontal mass, c/w likely metastasis from renal cell carcinoma.  Craniotomy was performed at [facility] and followed with surgical bed radiosurgery at [facility].  She did have one recurrence of seizure activity in late August, at which time she was restarted on Keppra .  Unclear on why this was discontinued.  Recent MRI demonstrated some change and she presents today for treatment recommendations.  No neurologic complaints aside from seizures.  Medications: Current Outpatient Medications on File Prior to Visit  Medication Sig Dispense Refill   acetaminophen  (TYLENOL ) 500 MG tablet Take 1,000 mg by mouth daily with breakfast.     colchicine  0.6 MG tablet Take 1 tablet (0.6 mg total) by mouth at bedtime. 90 tablet 3   hydrALAZINE  (APRESOLINE ) 10  MG tablet Take 1 tablet (10 mg total) by mouth 3 (three) times daily. 270 tablet 4   levETIRAcetam  (KEPPRA ) 500 MG tablet Take 1 tablet (500 mg total) by mouth 2 (two) times daily. 180 tablet 3   levothyroxine  (SYNTHROID ) 25 MCG tablet TAKE 1 TABLET BY MOUTH ONCE DAILY IN THE MORNING 30 TO 60 MINUTES BEFORE BREAKFAST ON AN EMPTY STOMACH WITH A FULL GLASS OF WATER  90 tablet 4   methocarbamol  (ROBAXIN ) 500 MG tablet Take 2 tablets (1,000 mg total) by mouth every 8 (eight) hours as needed. For muscle spasm 90 tablet 0   metoprolol  succinate (TOPROL -XL) 25 MG 24 hr tablet Take 1 tablet (25 mg total) by mouth daily. 90 tablet 4   nitroGLYCERIN  (NITROSTAT ) 0.4 MG SL tablet Place 1 tablet (0.4 mg total) under the tongue every 5 (five) minutes as needed for chest pain. 25 tablet 3   ondansetron  (ZOFRAN -ODT) 4 MG disintegrating tablet Take 1 tablet (4 mg total) by mouth every 8 (eight) hours as needed for nausea or vomiting. 20 tablet 0   OVER THE COUNTER MEDICATION Apply 1 application topically 2 (two) times daily. Pain cream in groin area     polyethylene glycol (MIRALAX  / GLYCOLAX ) 17 g packet Take 17 g by mouth daily as needed for mild constipation.     pravastatin  (PRAVACHOL ) 40 MG tablet Take 1 tablet (40 mg total) by mouth daily. 90 tablet 0   spironolactone  (ALDACTONE ) 25 MG tablet Take 1 tablet (25 mg total) by mouth daily. Please schedule appointment with Dr. Veryl Gottron for refills. 90 tablet 4   torsemide  (DEMADEX ) 20 MG tablet TAKE 2 TABLETS  BY MOUTH EVERY MORNING AND 1 TABLET EVERY EVENING 270 tablet 4   warfarin (COUMADIN ) 5 MG tablet TAKE ONE TABLET BY MOUTH ONCE A DAY IN THE EVENING OR AS DIRECTED BY THE COUMADIN  CLINIC 100 tablet 0   No current facility-administered medications on file prior to visit.    Allergies:  Allergies  Allergen Reactions   Avocado Shortness Of Breath    Before 2007   Banana Anaphylaxis and Swelling   Plantain Anaphylaxis and Swelling   Ace Inhibitors Cough    Codeine Other (See Comments)    Nausea and Feels Jittery   Prednisone  Swelling    Of face and lower extremities   Tape Hives    Tape  Does better with paper tape    Latex Rash   Pharmabase Cosmetic [Aquamed] Swelling and Rash    Some Soap, perfumes,detergent   Past Medical History:  Past Medical History:  Diagnosis Date   Acute kidney injury (HCC)    Arthritis    Atrial fibrillation (HCC)    Cancer (HCC)    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   Cardiomyopathy (HCC)    CHF (congestive heart failure) (HCC)    Chronic a-fib (HCC) 08/14/2016   Chronic anticoagulation 08/14/2016   Coronary artery disease    4v CABG 6/17   Graves disease    Hypertension    Hypothyroidism    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   SN [id number] Model 6900P [From patient's surgical info card]   Presence of permanent cardiac pacemaker    Seizures (HCC)    Thrombus 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 myocardial infarction    Thyroid disease    Past Surgical History:  Past Surgical History:  Procedure Laterality Date   ABDOMINAL HYSTERECTOMY     CORONARY ARTERY BYPASS GRAFT     CRANIOTOMY Right 07/30/2019   Right frontal   NEPHRECTOMY Right 10/16/2019   TOTAL HIP ARTHROPLASTY Right 07/11/2021   Procedure: RIGHT TOTAL HIP ARTHROPLASTY ANTERIOR APPROACH;  Surgeon: Wendolyn Hamburger, MD;  Location: [facility] ORS;  Service: Orthopedics;  Laterality: Right;   Social History:  Social History   Socioeconomic History   Marital status: Widowed    Spouse name: Not on file   Number of children: 0   Years of education: Doctorate   Highest education level: Doctorate  Occupational History   Occupation: Retired Orthoptist  Tobacco Use   Smoking status: Former    Current packs/day: 0.00    Types: Cigarettes    Quit date: 07/30/2014    Years since quitting: 9.1   Smokeless tobacco: Never  Vaping Use   Vaping status: Never Used  Substance and Sexual Activity   Alcohol use: Yes     Comment: 1 glass wine twice weekly   Drug use: No   Sexual activity: Not on file  Other Topics Concern   Not on file  Social History Narrative   Right handed   Coffee daily/tea qod   Social Drivers of Health   Financial Resource Strain: Low Risk  (04/24/2023)   Overall Financial Resource Strain (CARDIA)    Difficulty of Paying Living Expenses: Not very hard  Food Insecurity: No Food Insecurity (04/24/2023)   Hunger Vital Sign    Worried About Running Out of Food in the Last Year: Never true    Ran Out of Food in the Last Year: Never true  Transportation Needs: Unknown (04/24/2023)   PRAPARE - Transportation    Lack of Transportation (  Medical): Patient declined    Lack of Transportation (Non-Medical): No  Physical Activity: Insufficiently Active (04/24/2023)   Exercise Vital Sign    Days of Exercise per Week: 2 days    Minutes of Exercise per Session: 20 min  Stress: No Stress Concern Present (04/24/2023)   [employer] of Occupational Health - Occupational Stress Questionnaire    Feeling of Stress : Only a little  Social Connections: Moderately Integrated (04/24/2023)   Social Connection and Isolation Panel [NHANES]    Frequency of Communication with Friends and Family: Once a week    Frequency of Social Gatherings with Friends and Family: More than three times a week    Attends Religious Services: More than 4 times per year    Active Member of [employer] or Organizations: Yes    Attends Banker Meetings: More than 4 times per year    Marital Status: Widowed  Intimate Partner Violence: Not At Risk (04/24/2023)   Humiliation, Afraid, Rape, and Kick questionnaire    Fear of Current or Ex-Partner: No    Emotionally Abused: No    Physically Abused: No    Sexually Abused: No   Family History:  Family History  Problem Relation Age of Onset   Heart attack Mother    Heart attack Father    Heart attack Sister    Breast cancer Neg Hx     Review of  Systems: Constitutional: Doesn't report fevers, chills or abnormal weight loss Eyes: Doesn't report blurriness of vision Ears, nose, mouth, throat, and face: Doesn't report sore throat Respiratory: Doesn't report cough, dyspnea or wheezes Cardiovascular: Doesn't report palpitation, chest discomfort  Gastrointestinal:  Doesn't report nausea, constipation, diarrhea GU: Doesn't report incontinence Skin: Doesn't report skin rashes Neurological: Per HPI Musculoskeletal: Doesn't report joint pain Behavioral/Psych: Doesn't report anxiety  Physical Exam:    08/22/2023    1:36 PM 07/31/2023    7:00 PM 07/31/2023    6:15 PM  Vitals with BMI  Height 5\' 8"     Weight 182 lbs    BMI 27.68    Systolic 118 118 161  Diastolic 78 71 81  Pulse 69 68 71     KPS: 80. General: Alert, cooperative, pleasant, in no acute distress Head: Normal EENT: No conjunctival injection or scleral icterus.  Lungs: Resp effort normal Cardiac: Regular rate Abdomen: Non-distended abdomen Skin: No rashes cyanosis or petechiae. Extremities: No clubbing or edema  Neurologic Exam: Mental Status: Awake, alert, attentive to examiner. Oriented to self and environment. Language is fluent with intact comprehension.  Cranial Nerves: Visual acuity is grossly normal. Visual fields are full. Extra-ocular movements intact. No ptosis. Face is symmetric Motor: Tone and bulk are normal. Power is full in both arms and legs. Reflexes are symmetric, no pathologic reflexes present.  Sensory: Intact to light touch Gait: Orthopedic limitations  Labs: I have reviewed the data as listed    Component Value Date/Time   NA 135 07/31/2023 1642   NA 141 12/14/2020 1053   K 4.2 07/31/2023 1642   CL 102 07/31/2023 1642   CO2 21 (L) 07/31/2023 1642   GLUCOSE 81 07/31/2023 1642   BUN 32 (H) 07/31/2023 1642   BUN 45 (H) 12/14/2020 1053   CREATININE 2.62 (H) 07/31/2023 1642   CREATININE 2.95 (H) 07/31/2023 1033   CALCIUM 9.4 07/31/2023  1642   PROT 7.4 07/31/2023 1642   PROT 7.7 03/28/2022 1157   ALBUMIN  4.6 07/31/2023 1642   ALBUMIN  4.8 03/28/2022 1157  AST 22 07/31/2023 1642   AST 24 07/31/2023 1033   ALT 16 07/31/2023 1642   ALT 18 07/31/2023 1033   ALKPHOS 75 07/31/2023 1642   BILITOT 1.0 07/31/2023 1642   BILITOT 1.0 07/31/2023 1033   GFRNONAA 18 (L) 07/31/2023 1642   GFRNONAA 16 (L) 07/31/2023 1033   GFRAA 30 (L) 01/20/2020 1433   Lab Results  Component Value Date   WBC 5.8 07/31/2023   NEUTROABS 3.6 07/31/2023   HGB 13.1 07/31/2023   HCT 39.6 07/31/2023   MCV 73.6 (L) 07/31/2023   PLT 227 07/31/2023    Imaging:  [facility] Clinician Interpretation: I have personally reviewed the CNS images as listed.  My interpretation, in the context of the patient's clinical presentation, is stable disease  MR BRAIN W WO CONTRAST Result Date: 09/19/2023 CLINICAL DATA:  Brain/CNS neoplasm, assess treatment response. Renal cell carcinoma and isolated CNS metastasis. EXAM: MRI HEAD WITHOUT AND WITH CONTRAST TECHNIQUE: Multiplanar, multiecho pulse sequences of the brain and surrounding structures were obtained without and with intravenous contrast. CONTRAST:  8mL GADAVIST  GADOBUTROL  1 MMOL/ML IV SOLN COMPARISON:  03/08/2023 and multiple older studies as distant as 01/28/2021. FINDINGS: Brain: No worsening or progressive finding. Chronic small-vessel ischemic changes affect the pons. Few old small vessel cerebellar insults are unchanged. Postsurgical changes at the right frontoparietal vertex are stable with stable gliosis and linear enhancement. No evidence of increasing enhancement, edema or mass effect. Stable small region of cortical enhancement in the right posterior frontal cortex, axial postcontrast image 159. This could be vascular enhancement. It is stable over the last several studies and unlikely to represent viable metastatic disease. Stable small focus of susceptibility at a treated left frontal cortical metastasis, axial  postcontrast image 144. Chronic small-vessel ischemic changes of the thalami and hemispheric white matter are unchanged. 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 No enhancement. Vascular: Major vessels at the base of the brain show flow. Skull and upper cervical spine: No progressive change in a presumably treated right posterior frontal calvarial metastasis over time. No new bone finding. 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 IMPRESSION: 1. Stable examination. No evidence of worsening or progressive disease. 2. Stable postsurgical changes at the right frontoparietal vertex. 3. Stable small region of cortical enhancement in the right posterior frontal cortex, possibly vascular enhancement. This is stable over the last several studies and unlikely to represent viable metastatic disease. 4. Stable small focus of susceptibility at a treated left frontal cortical metastasis. No residual or recurrent disease in this location. 5. Stable chronic small-vessel ischemic changes of the pons, thalami and hemispheric white matter. 6. Stable cortical thickening with increased T2 and FLAIR signal in the right posterior temporoparietal junction region. 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 No enhancement. 7. Stable presumably treated right posterior frontal calvarial metastasis. Electronically Signed   By: Bettylou Brunner M.D.   On: 09/19/2023 10:06   CUP PACEART REMOTE DEVICE CHECK Result Date: 09/02/2023 Scheduled remote reviewed. Normal device function.  Presenting rhythm:  AFL, BIV paced.  HF diagnostics have been normal in this monitoring period. Next remote 91 days. - CS, CVRSMRI Protection last programmed  on Mar 08, 2023. Beeper is OFF.    Assessment/Plan Malignant neoplasm metastatic to  brain [facility]) [C79.31]  ROLENE GORES is clinically and radiographically stable today.  No recurrence or novel disease.  Continues on observation for renal cell carcinoma with Dr. Marguerita Shih.   Recommended continuing Keppra  at 500mg  BID.    We appreciate the opportunity to participate in the care of AHLEAH BERARDINO.    We ask that UBAH LAIDLEY return to clinic in 6 months following next brain MRI, or sooner as needed.  All questions were answered. The patient knows to call the clinic with any problems, questions or concerns. No barriers to learning were detected.  The total time spent in the encounter was 40 minutes and more than 50% was on counseling and review of test results   Mamie Searles, MD Medical Director of Neuro-Oncology [facility] 09/20/23 9:05 AM

## 2023-09-26 ENCOUNTER — Other Ambulatory Visit: Payer: Self-pay | Admitting: Radiation Therapy

## 2023-09-27 ENCOUNTER — Ambulatory Visit: Attending: Cardiology | Admitting: *Deleted

## 2023-09-27 DIAGNOSIS — I482 Chronic atrial fibrillation, unspecified: Secondary | ICD-10-CM

## 2023-09-27 DIAGNOSIS — Z7901 Long term (current) use of anticoagulants: Secondary | ICD-10-CM | POA: Insufficient documentation

## 2023-09-27 DIAGNOSIS — Z5181 Encounter for therapeutic drug level monitoring: Secondary | ICD-10-CM | POA: Diagnosis present

## 2023-09-27 LAB — POCT INR: POC INR: 1.5

## 2023-09-27 NOTE — Patient Instructions (Signed)
 Description   Take 1.5 tablets of warfarin today and tomorrow.  Then continue taking Warfarin 1 tablet daily except 1/2 tablet on Sundays.  Recheck INR in 2 weeks.  Coumadin  Clinic [phone]

## 2023-10-11 ENCOUNTER — Encounter

## 2023-10-11 ENCOUNTER — Ambulatory Visit: Attending: Cardiology | Admitting: *Deleted

## 2023-10-11 DIAGNOSIS — I482 Chronic atrial fibrillation, unspecified: Secondary | ICD-10-CM | POA: Diagnosis present

## 2023-10-11 DIAGNOSIS — Z5181 Encounter for therapeutic drug level monitoring: Secondary | ICD-10-CM | POA: Insufficient documentation

## 2023-10-11 DIAGNOSIS — Z7901 Long term (current) use of anticoagulants: Secondary | ICD-10-CM | POA: Insufficient documentation

## 2023-10-11 LAB — POCT INR: POC INR: 2.5

## 2023-10-11 NOTE — Patient Instructions (Signed)
 Description   Continue taking Warfarin 1 tablet daily except 1/2 tablet on Sundays.  Recheck INR in 4 weeks.  Coumadin Clinic [phone]

## 2023-10-12 NOTE — Addendum Note (Signed)
 Addended by: Lott Rouleau A on: 10/12/2023 08:32 AM   Modules accepted: Orders

## 2023-10-12 NOTE — Progress Notes (Signed)
 Remote ICD transmission.

## 2023-11-05 ENCOUNTER — Telehealth: Payer: Self-pay | Admitting: *Deleted

## 2023-11-05 NOTE — Telephone Encounter (Signed)
 Speech therapy recert & updated therapy plans signed by Dr Mark Sil & faxed to [facility], [phone].  Fax confirmation received.

## 2023-11-08 ENCOUNTER — Ambulatory Visit: Attending: Cardiology

## 2023-11-08 DIAGNOSIS — I482 Chronic atrial fibrillation, unspecified: Secondary | ICD-10-CM

## 2023-11-08 DIAGNOSIS — Z7901 Long term (current) use of anticoagulants: Secondary | ICD-10-CM

## 2023-11-08 LAB — POCT INR: INR: 3.4 — AB (ref 2.0–3.0)

## 2023-11-08 NOTE — Patient Instructions (Signed)
 Description   HOLD today's dose and then continue taking Warfarin 1 tablet daily except 1/2 tablet on Sundays.  Recheck INR in 4 weeks.  Coumadin  Clinic [phone]

## 2023-11-08 NOTE — Progress Notes (Signed)
Please see anticoagulation encounter.

## 2023-11-20 ENCOUNTER — Ambulatory Visit: Attending: Internal Medicine | Admitting: Internal Medicine

## 2023-11-20 ENCOUNTER — Encounter: Payer: Self-pay | Admitting: Internal Medicine

## 2023-11-20 VITALS — BP 99/61 | HR 69 | Temp 97.9°F | Ht 68.0 in | Wt 187.0 lb

## 2023-11-20 DIAGNOSIS — C7931 Secondary malignant neoplasm of brain: Secondary | ICD-10-CM | POA: Diagnosis not present

## 2023-11-20 DIAGNOSIS — Z780 Asymptomatic menopausal state: Secondary | ICD-10-CM | POA: Diagnosis not present

## 2023-11-20 DIAGNOSIS — M8589 Other specified disorders of bone density and structure, multiple sites: Secondary | ICD-10-CM | POA: Insufficient documentation

## 2023-11-20 DIAGNOSIS — I10 Essential (primary) hypertension: Secondary | ICD-10-CM

## 2023-11-20 DIAGNOSIS — Z951 Presence of aortocoronary bypass graft: Secondary | ICD-10-CM | POA: Insufficient documentation

## 2023-11-20 DIAGNOSIS — I4821 Permanent atrial fibrillation: Secondary | ICD-10-CM | POA: Insufficient documentation

## 2023-11-20 DIAGNOSIS — I130 Hypertensive heart and chronic kidney disease with heart failure and stage 1 through stage 4 chronic kidney disease, or unspecified chronic kidney disease: Secondary | ICD-10-CM | POA: Diagnosis not present

## 2023-11-20 DIAGNOSIS — E890 Postprocedural hypothyroidism: Secondary | ICD-10-CM | POA: Diagnosis not present

## 2023-11-20 DIAGNOSIS — I5022 Chronic systolic (congestive) heart failure: Secondary | ICD-10-CM

## 2023-11-20 DIAGNOSIS — N184 Chronic kidney disease, stage 4 (severe): Secondary | ICD-10-CM | POA: Insufficient documentation

## 2023-11-20 DIAGNOSIS — G40909 Epilepsy, unspecified, not intractable, without status epilepticus: Secondary | ICD-10-CM | POA: Insufficient documentation

## 2023-11-20 DIAGNOSIS — Z79899 Other long term (current) drug therapy: Secondary | ICD-10-CM | POA: Diagnosis not present

## 2023-11-20 DIAGNOSIS — Z952 Presence of prosthetic heart valve: Secondary | ICD-10-CM | POA: Diagnosis not present

## 2023-11-20 DIAGNOSIS — E89810 Postprocedural hemorrhage and hematoma of an endocrine system organ or structure following an endocrine system procedure: Secondary | ICD-10-CM

## 2023-11-20 DIAGNOSIS — Z95810 Presence of automatic (implantable) cardiac defibrillator: Secondary | ICD-10-CM | POA: Diagnosis not present

## 2023-11-20 DIAGNOSIS — Z7901 Long term (current) use of anticoagulants: Secondary | ICD-10-CM | POA: Insufficient documentation

## 2023-11-20 DIAGNOSIS — Z79890 Hormone replacement therapy (postmenopausal): Secondary | ICD-10-CM | POA: Insufficient documentation

## 2023-11-20 DIAGNOSIS — M109 Gout, unspecified: Secondary | ICD-10-CM | POA: Diagnosis not present

## 2023-11-20 DIAGNOSIS — I5032 Chronic diastolic (congestive) heart failure: Secondary | ICD-10-CM | POA: Insufficient documentation

## 2023-11-20 DIAGNOSIS — I428 Other cardiomyopathies: Secondary | ICD-10-CM | POA: Insufficient documentation

## 2023-11-20 DIAGNOSIS — M8580 Other specified disorders of bone density and structure, unspecified site: Secondary | ICD-10-CM

## 2023-11-20 DIAGNOSIS — I482 Chronic atrial fibrillation, unspecified: Secondary | ICD-10-CM

## 2023-11-20 DIAGNOSIS — I2510 Atherosclerotic heart disease of native coronary artery without angina pectoris: Secondary | ICD-10-CM | POA: Diagnosis not present

## 2023-11-20 DIAGNOSIS — Z85528 Personal history of other malignant neoplasm of kidney: Secondary | ICD-10-CM | POA: Diagnosis not present

## 2023-11-20 NOTE — Patient Instructions (Signed)
 VISIT SUMMARY:  Today, you had a follow-up appointment to review your physical therapy progress and cancer surveillance. You have successfully completed your physical therapy program and are now using a cane. Your recent brain MRI showed no changes, and you have no new symptoms related to your cardiovascular conditions. Your chronic kidney disease remains stable, and your thyroid levels are well-controlled. We also discussed your general health maintenance and goals of care.  YOUR PLAN:  -POST-HIP SURGERY REHABILITATION: You have successfully completed your physical therapy and are now using a cane. Continue with the recommended exercises such as leg lifts and bridges to maintain your mobility.  -ATRIAL FIBRILLATION AND CONGESTIVE HEART FAILURE: Atrial fibrillation is an irregular heartbeat, and congestive heart failure is when the heart doesn't pump blood as well as it should. You are on medications like Coumadin , metoprolol , pravastatin , spironolactone , torsemide , and hydralazine  to manage these conditions. Please schedule a follow-up with your cardiologist.  -CHRONIC KIDNEY DISEASE STAGE 4: Chronic kidney disease stage 4 means your kidneys are significantly damaged. Your GFR has improved from 18 to 23, indicating your condition is well-managed. Continue regular follow-ups with your nephrologist, Dr. Lamond, every four months.  -BRAIN Metstasis SURVEILLANCE: Your recent brain MRI in April showed no signs of active recurrence, indicating stability. Continue with regular monitoring as advised by your specialist.  -SEIZURE DISORDER: A seizure disorder means you have episodes of abnormal electrical activity in the brain. You are taking Keppra  twice daily and have not had any recent seizures. Continue with your current medication.  -HYPOTHYROIDISM: Hypothyroidism is when your thyroid gland doesn't produce enough hormones. Your thyroid levels are well-controlled with levothyroxine . We will check your  thyroid level to ensure it remains stable.  -GENERAL HEALTH MAINTENANCE: You are not currently taking calcium or vitamin D supplements. Your last bone density scan in 2020 showed mild thinning. We will order a new bone density scan and check your vitamin D level. You have chosen to avoid further colonoscopy or mammogram due to age and personal preference.  INSTRUCTIONS:  Please schedule follow-up appointments with your cardiologist and nephrologist. We will also order a bone density scan and check your vitamin D and thyroid levels.

## 2023-11-20 NOTE — Progress Notes (Signed)
 Patient ID: NDIA SAMPATH, female    DOB: 05-08-1946  MRN: [id number]  CC: Hypertension (HTN f/u./Discuss if eligible for Cologuard. Yes to mammogram. )   Subjective: Jacqueline Palmer is a 78 y.o. female who presents for chronic ds management. Her concerns today include:  Patient with history of CAD with history CABGx4, chronic diastolic CHF/NICM (recovered LVEF) with BV-ICD, HTN, rheumatic heart disease status post MVR replacement, permanent A. 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       Discussed the use of AI scribe software for clinical note transcription with the patient, who gave verbal consent to proceed.  History of Present Illness Jacqueline Palmer is a 78 year old female who presents for follow-up on her physical therapy and chronic ds management  She has completed a physical therapy program at her assisted living facility today, focusing on mobility post-hip surgery. She now uses a cane instead of a walker and will be continuing exercises taught such as leg lifts and bridges. No further falls have occurred.  RCC with brain mets: Her cancer surveillance includes a recent brain MRI in April, which showed stable findings. She was last seen by a cancer specialist in February and has f/u later this mth.  HTN/CAD/A.Fib/CHF:  She continues on Coumadin , metoprolol , pravastatin , spironolactone , torsemide , and hydralazine . She experiences no chest pain, shortness of breath, or leg swelling.  CKD 4: GFR of 23 noted in May on last visit with nephrology Dr. Jennetta.   Seizure disorder: Stable on Keppra  with no seizures since I last saw her in  January.  Hypothyroidism, she remains stable on levothyroxine .  Last TSH was done 6 months ago and was 2.5 which is normal.  She has history of osteopenia with last bone density study done 5 years ago. She is not on calcium or vitamin D  HM: She decided against pursuing colonoscopy any further.  No longer at age where [facility] is recommended.  Pt declines any further screening    Patient Active Problem List   Diagnosis Date Noted   Goals of care, counseling/discussion 10/12/2022   Coronary artery disease of autologous vein bypass graft with stable angina pectoris (HCC) 08/14/2022   Advance directive in chart 04/24/2021   Ascending aortic aneurysm (HCC) 04/24/2021   Gait disturbance 04/19/2021   Acute-on-chronic kidney injury (HCC) 12/03/2020   Subtherapeutic international normalized ratio (INR) 12/03/2020   Chest pain 12/02/2020   Neuropathic pain 11/25/2020   Seizures (HCC) 06/16/2020   Over weight 05/03/2020   Nonischemic cardiomyopathy (HCC) 03/04/2020   Biventricular automatic implantable cardioverter defibrillator in situ 02/19/2020   Chronic diastolic heart failure (HCC) 02/19/2020   Hx of CABG 02/19/2020   Coronary artery disease involving native coronary artery of native heart without angina pectoris 02/19/2020   Essential hypertension 02/19/2020   Permanent atrial fibrillation (HCC) 02/19/2020   CKD (chronic kidney disease) stage 4, GFR 15-29 ml/min (HCC) 02/19/2020   Renal cell carcinoma of right kidney (HCC) 02/12/2020   Malignant neoplasm metastatic to brain (HCC) 02/12/2020   Gout attack 08/14/2016   Chronic a-fib (HCC) 08/14/2016   Chronic anticoagulation 08/14/2016   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     Current Outpatient  Medications on File Prior to Visit  Medication Sig Dispense Refill   acetaminophen  (TYLENOL ) 500 MG tablet Take 1,000 mg by mouth daily with breakfast.     colchicine  0.6 MG tablet Take 1 tablet (0.6 mg total) by mouth at bedtime. 90 tablet 3    hydrALAZINE  (APRESOLINE ) 10 MG tablet Take 1 tablet (10 mg total) by mouth 3 (three) times daily. 270 tablet 4   levETIRAcetam  (KEPPRA ) 500 MG tablet Take 1 tablet (500 mg total) by mouth 2 (two) times daily. 180 tablet 3   levothyroxine  (SYNTHROID ) 25 MCG tablet TAKE 1 TABLET BY MOUTH ONCE DAILY IN THE MORNING 30 TO 60 MINUTES BEFORE BREAKFAST ON AN EMPTY STOMACH WITH A FULL GLASS OF WATER  90 tablet 4   methocarbamol  (ROBAXIN ) 500 MG tablet Take 2 tablets (1,000 mg total) by mouth every 8 (eight) hours as needed. For muscle spasm 90 tablet 0   metoprolol  succinate (TOPROL -XL) 25 MG 24 hr tablet Take 1 tablet (25 mg total) by mouth daily. 90 tablet 4   nitroGLYCERIN  (NITROSTAT ) 0.4 MG SL tablet Place 1 tablet (0.4 mg total) under the tongue every 5 (five) minutes as needed for chest pain. 25 tablet 3   ondansetron  (ZOFRAN -ODT) 4 MG disintegrating tablet Take 1 tablet (4 mg total) by mouth every 8 (eight) hours as needed for nausea or vomiting. 20 tablet 0   OVER THE COUNTER MEDICATION Apply 1 application topically 2 (two) times daily. Pain cream in groin area     polyethylene glycol (MIRALAX  / GLYCOLAX ) 17 g packet Take 17 g by mouth daily as needed for mild constipation.     pravastatin  (PRAVACHOL ) 40 MG tablet Take 1 tablet (40 mg total) by mouth daily. 90 tablet 0   spironolactone  (ALDACTONE ) 25 MG tablet Take 1 tablet (25 mg total) by mouth daily. Please schedule appointment with Dr. Lonni for refills. 90 tablet 4   torsemide  (DEMADEX ) 20 MG tablet TAKE 2 TABLETS BY MOUTH EVERY MORNING AND 1 TABLET EVERY EVENING 270 tablet 4   warfarin (COUMADIN ) 5 MG tablet TAKE ONE TABLET BY MOUTH ONCE A DAY IN THE EVENING OR AS DIRECTED BY THE COUMADIN  CLINIC 100 tablet 0   No current facility-administered medications on file prior to visit.    Allergies  Allergen Reactions   Avocado Shortness Of Breath    Before 2007   Banana Anaphylaxis and Swelling   Plantain Anaphylaxis and Swelling   Ace  Inhibitors Cough   Codeine Other (See Comments)    Nausea and Feels Jittery   Prednisone  Swelling    Of face and lower extremities   Tape Hives    Tape  Does better with paper tape    Latex Rash   Pharmabase Cosmetic [[place]] Swelling and Rash    Some Soap, perfumes,detergent    Social History   Socioeconomic History   Marital status: Widowed    Spouse name: Not on file   Number of children: 0   Years of education: Doctorate   Highest education level: Doctorate  Occupational History   Occupation: Retired Orthoptist  Tobacco Use   Smoking status: Former    Current packs/day: 0.00    Types: Cigarettes    Quit date: 07/30/2014    Years since quitting: 9.3   Smokeless tobacco: Never  Vaping Use   Vaping status: Never Used  Substance and Sexual Activity   Alcohol use: Yes    Comment: 1 glass wine twice weekly   Drug use: No  Sexual activity: Not on file  Other Topics Concern   Not on file  Social History Narrative   Right handed   Coffee daily/tea qod   Social Drivers of Health   Financial Resource Strain: Low Risk  (11/17/2023)   Overall Financial Resource Strain (CARDIA)    Difficulty of Paying Living Expenses: Not very hard  Food Insecurity: No Food Insecurity (11/17/2023)   Hunger Vital Sign    Worried About Running Out of Food in the Last Year: Never true    Ran Out of Food in the Last Year: Never true  Transportation Needs: No Transportation Needs (11/17/2023)   PRAPARE - Administrator, Civil Service (Medical): No    Lack of Transportation (Non-Medical): No  Physical Activity: Insufficiently Active (11/17/2023)   Exercise Vital Sign    Days of Exercise per Week: 2 days    Minutes of Exercise per Session: 30 min  Stress: No Stress Concern Present (11/17/2023)   [employer] of Occupational Health - Occupational Stress Questionnaire    Feeling of Stress: Not at all  Social Connections: Moderately Integrated (11/17/2023)   Social Connection and  Isolation Panel    Frequency of Communication with Friends and Family: More than three times a week    Frequency of Social Gatherings with Friends and Family: More than three times a week    Attends Religious Services: More than 4 times per year    Active Member of [employer] or Organizations: Yes    Attends Banker Meetings: More than 4 times per year    Marital Status: Widowed  Intimate Partner Violence: Not At Risk (04/24/2023)   Humiliation, Afraid, Rape, and Kick questionnaire    Fear of Current or Ex-Partner: No    Emotionally Abused: No    Physically Abused: No    Sexually Abused: No    Family History  Problem Relation Age of Onset   Heart attack Mother    Heart attack Father    Heart attack Sister    Breast cancer Neg Hx     Past Surgical History:  Procedure Laterality Date   ABDOMINAL HYSTERECTOMY     CORONARY ARTERY BYPASS GRAFT     CRANIOTOMY Right 07/30/2019   Right frontal   NEPHRECTOMY Right 10/16/2019   TOTAL HIP ARTHROPLASTY Right 07/11/2021   Procedure: RIGHT TOTAL HIP ARTHROPLASTY ANTERIOR APPROACH;  Surgeon: Liam Lerner, MD;  Location: [facility];  Service: Orthopedics;  Laterality: Right;    ROS: Review of Systems Negative except as stated above  PHYSICAL EXAM: BP 99/61 (BP Location: Right Arm, Patient Position: Sitting, Cuff Size: Normal)   Pulse 69   Temp 97.9 F (36.6 C) (Oral)   Ht 5' 8 (1.727 m)   Wt 187 lb (84.8 kg)   SpO2 100%   BMI 28.43 kg/m   Physical Exam  General appearance - alert, well appearing, older African-American female and in no distress.  She ambulates with a walker Mental status - normal mood, behavior, speech, dress, motor activity, and thought processes Neck - supple, no significant adenopathy Chest - clear to auscultation, no wheezes, rales or rhonchi, symmetric air entry Heart - normal rate, regular rhythm, normal S1, S2, no murmurs, rubs, clicks or gallops Extremities - peripheral pulses normal, no pedal edema,  no clubbing or cyanosis      Latest Ref Rng & Units 07/31/2023    4:42 PM 07/31/2023   10:33 AM 04/09/2023   11:11 AM  CMP  Glucose 70 -  99 mg/dL 81  97  85   BUN 8 - 23 mg/dL 32  35  48   Creatinine 0.44 - 1.00 mg/dL 7.37  7.04  7.38   Sodium 135 - 145 mmol/L 135  135  136   Potassium 3.5 - 5.1 mmol/L 4.2  4.1  4.6   Chloride 98 - 111 mmol/L 102  102  103   CO2 22 - 32 mmol/L 21  23  26    Calcium 8.9 - 10.3 mg/dL 9.4  9.6  9.7   Total Protein 6.5 - 8.1 g/dL 7.4  7.9  7.6   Total Bilirubin 0.0 - 1.2 mg/dL 1.0  1.0  0.7   Alkaline Phos 38 - 126 U/L 75  84  97   AST 15 - 41 U/L 22  24  25    ALT 0 - 44 U/L 16  18  18     Lipid Panel     Component Value Date/Time   CHOL 181 03/28/2022 1157   TRIG 118 03/28/2022 1157   HDL 64 03/28/2022 1157   CHOLHDL 2.8 03/28/2022 1157   LDLCALC 96 03/28/2022 1157    CBC    Component Value Date/Time   WBC 5.8 07/31/2023 1527   RBC 5.38 (H) 07/31/2023 1527   HGB 13.1 07/31/2023 1527   HGB 13.3 07/31/2023 1033   HGB 12.4 08/18/2021 1219   HCT 39.6 07/31/2023 1527   HCT 39.5 08/18/2021 1219   PLT 227 07/31/2023 1527   PLT 211 07/31/2023 1033   PLT 292 08/18/2021 1219   MCV 73.6 (L) 07/31/2023 1527   MCV 82 08/18/2021 1219   MCH 24.3 (L) 07/31/2023 1527   MCHC 33.1 07/31/2023 1527   RDW 14.6 07/31/2023 1527   RDW 16.0 (H) 08/18/2021 1219   LYMPHSABS 1.4 07/31/2023 1527   MONOABS 0.7 07/31/2023 1527   EOSABS 0.0 07/31/2023 1527   BASOSABS 0.0 07/31/2023 1527    ASSESSMENT AND PLAN: 1. Essential hypertension (Primary) At goal.  Continue spironolactone  25 mg daily, metoprolol  25 mg daily, torsemide  20 mg daily, hydralazine  10 mg 3 times a day  2. Coronary artery disease involving native coronary artery of native heart without angina pectoris Stable.  Continue metoprolol  25 mg daily and Pravachol  40 mg daily   3. Postablative hypothyroidism Continue levothyroxine  - TSH  4. CKD (chronic kidney disease) stage 4, GFR 15-29 ml/min  (HCC) Stable.  We will continue to monitor.  5. Chronic a-fib (HCC) Sounds to be in SR at this time on exam.  Stable on Coumadin .  INR being checked through cardiology anticoagulation clinic.  6. Chronic systolic heart failure (HCC) Stable on spironolactone  25 mg daily, Toprol -XL 25 mg daily, hydralazine  10 mg 3 times a day, and torsemide    7. Seizure disorder (HCC) Continue Keppra .  Stable.  8. Osteopenia, unspecified location I will check with her nephrologist to make sure with her CKD, we can have her take Ca 1200mg /Vit D 800 daily and will get back to her. I wanted to check vitamin D level but looks like it is not covered by her insurance - DG Bone Density; Future  9. Postmenopausal estrogen deficiency - DG Bone Density; Future   Patient was given the opportunity to ask questions.  Patient verbalized understanding of the plan and was able to repeat key elements of the plan.   This documentation was completed using Paediatric nurse.  Any transcriptional errors are unintentional.  No orders of the defined types were placed  in this encounter.    Requested Prescriptions    No prescriptions requested or ordered in this encounter    No follow-ups on file.  Barnie Louder, MD, FACP

## 2023-11-21 ENCOUNTER — Telehealth: Payer: Self-pay

## 2023-11-21 ENCOUNTER — Ambulatory Visit: Payer: Self-pay | Admitting: Internal Medicine

## 2023-11-21 ENCOUNTER — Other Ambulatory Visit (HOSPITAL_BASED_OUTPATIENT_CLINIC_OR_DEPARTMENT_OTHER): Payer: Self-pay | Admitting: Internal Medicine

## 2023-11-21 DIAGNOSIS — Z1231 Encounter for screening mammogram for malignant neoplasm of breast: Secondary | ICD-10-CM

## 2023-11-21 LAB — TSH: TSH: 3.46 u[IU]/mL (ref 0.450–4.500)

## 2023-11-21 NOTE — Telephone Encounter (Signed)
 Called patient but no answer. LVM informing patient to call back to schedule for bone density appointment. Currently [facility] is scheduled out to Feb 2026. Their representative stated that there are sooner appointments at either [facility] or [place]. Please inquire patient's location preference if she calls back.

## 2023-11-22 NOTE — Telephone Encounter (Signed)
 Called & spoke tot the patient. Verified name & DOB. Patient confirmed that she would like an appointment at the [place] location.  Awaiting until Monday when their office is open to schedule the appointment.

## 2023-11-27 ENCOUNTER — Inpatient Hospital Stay: Payer: Medicare Other | Attending: Internal Medicine

## 2023-11-27 ENCOUNTER — Ambulatory Visit (HOSPITAL_COMMUNITY)
Admission: RE | Admit: 2023-11-27 | Discharge: 2023-11-27 | Disposition: A | Discharge status: Home / Self Care | Source: Ambulatory Visit | Attending: Internal Medicine | Admitting: Internal Medicine

## 2023-11-27 DIAGNOSIS — C641 Malignant neoplasm of right kidney, except renal pelvis: Secondary | ICD-10-CM | POA: Insufficient documentation

## 2023-11-27 DIAGNOSIS — Z923 Personal history of irradiation: Secondary | ICD-10-CM | POA: Insufficient documentation

## 2023-11-27 DIAGNOSIS — Z905 Acquired absence of kidney: Secondary | ICD-10-CM | POA: Diagnosis not present

## 2023-11-27 DIAGNOSIS — C7931 Secondary malignant neoplasm of brain: Secondary | ICD-10-CM | POA: Diagnosis not present

## 2023-11-27 LAB — CMP (CANCER CENTER ONLY)
ALT: 20 U/L (ref 0–44)
AST: 23 U/L (ref 15–41)
Albumin: 4.3 g/dL (ref 3.5–5.0)
Alkaline Phosphatase: 84 U/L (ref 38–126)
Anion gap: 7 (ref 5–15)
BUN: 38 mg/dL — ABNORMAL HIGH (ref 8–23)
CO2: 23 mmol/L (ref 22–32)
Calcium: 9.7 mg/dL (ref 8.9–10.3)
Chloride: 105 mmol/L (ref 98–111)
Creatinine: 2.23 mg/dL — ABNORMAL HIGH (ref 0.44–1.00)
GFR, Estimated: 22 mL/min — ABNORMAL LOW (ref >60)
Glucose, Bld: 89 mg/dL (ref 70–99)
Potassium: 5 mmol/L (ref 3.5–5.1)
Sodium: 135 mmol/L (ref 135–145)
Total Bilirubin: 0.7 mg/dL (ref 0.0–1.2)
Total Protein: 7.5 g/dL (ref 6.5–8.1)

## 2023-11-27 LAB — CBC WITH DIFFERENTIAL (CANCER CENTER ONLY)
Abs Immature Granulocytes: 0.01 K/uL (ref 0.00–0.07)
Basophils Absolute: 0.1 K/uL (ref 0.0–0.1)
Basophils Relative: 1 %
Eosinophils Absolute: 0.2 K/uL (ref 0.0–0.5)
Eosinophils Relative: 3 %
HCT: 35.2 % — ABNORMAL LOW (ref 36.0–46.0)
Hemoglobin: 11.9 g/dL — ABNORMAL LOW (ref 12.0–15.0)
Immature Granulocytes: 0 %
Lymphocytes Relative: 27 %
Lymphs Abs: 1.5 K/uL (ref 0.7–4.0)
MCH: 24.6 pg — ABNORMAL LOW (ref 26.0–34.0)
MCHC: 33.8 g/dL (ref 30.0–36.0)
MCV: 72.7 fL — ABNORMAL LOW (ref 80.0–100.0)
Monocytes Absolute: 0.8 K/uL (ref 0.1–1.0)
Monocytes Relative: 15 %
Neutro Abs: 3 K/uL (ref 1.7–7.7)
Neutrophils Relative %: 54 %
Platelet Count: 223 K/uL (ref 150–400)
RBC: 4.84 MIL/uL (ref 3.87–5.11)
RDW: 14.6 % (ref 11.5–15.5)
WBC Count: 5.5 K/uL (ref 4.0–10.5)
nRBC: 0 % (ref 0.0–0.2)

## 2023-11-28 NOTE — Telephone Encounter (Signed)
 Called [facility] [place], phone number: [phone].  Scheduled bone density appointment for 12/10/2023 at 10:30 a.m.  Pending prior authorization.

## 2023-11-29 ENCOUNTER — Ambulatory Visit: Payer: Medicare Other

## 2023-11-29 DIAGNOSIS — I428 Other cardiomyopathies: Secondary | ICD-10-CM | POA: Diagnosis not present

## 2023-11-30 LAB — CUP PACEART REMOTE DEVICE CHECK
Battery Remaining Longevity: 18 mo
Battery Remaining Percentage: 24 %
Brady Statistic RA Percent Paced: 0 %
Brady Statistic RV Percent Paced: 99 %
Date Time Interrogation Session: 20250710022600
HighPow Impedance: 77 Ohm
Implantable Lead Connection Status: 753985
Implantable Lead Connection Status: 753985
Implantable Lead Connection Status: 753985
Implantable Lead Implant Date: 20180423
Implantable Lead Implant Date: 20180423
Implantable Lead Implant Date: 20180423
Implantable Lead Location: 753858
Implantable Lead Location: 753859
Implantable Lead Location: 753860
Implantable Lead Model: 292
Implantable Lead Model: 4672
Implantable Lead Model: 7740
Implantable Lead Serial Number: 431793
Implantable Lead Serial Number: 602825
Implantable Lead Serial Number: 703305
Implantable Pulse Generator Implant Date: 20180423
Lead Channel Impedance Value: 1005 Ohm
Lead Channel Impedance Value: 398 Ohm
Lead Channel Impedance Value: 549 Ohm
Lead Channel Pacing Threshold Amplitude: 0.4 V
Lead Channel Pacing Threshold Amplitude: 0.5 V
Lead Channel Pacing Threshold Pulse Width: 0.4 ms
Lead Channel Pacing Threshold Pulse Width: 0.4 ms
Lead Channel Setting Pacing Amplitude: 1.6 V
Lead Channel Setting Pacing Amplitude: 2 V
Lead Channel Setting Pacing Pulse Width: 0.4 ms
Lead Channel Setting Pacing Pulse Width: 0.4 ms
Lead Channel Setting Sensing Sensitivity: 0.6 mV
Lead Channel Setting Sensing Sensitivity: 1 mV
Pulse Gen Serial Number: 174046
Zone Setting Status: 755011

## 2023-12-01 ENCOUNTER — Ambulatory Visit: Payer: Self-pay | Admitting: Cardiology

## 2023-12-04 ENCOUNTER — Inpatient Hospital Stay (HOSPITAL_BASED_OUTPATIENT_CLINIC_OR_DEPARTMENT_OTHER): Payer: Medicare Other | Admitting: Internal Medicine

## 2023-12-04 VITALS — BP 106/75 | HR 70 | Temp 98.2°F | Resp 16 | Ht 68.0 in | Wt 189.0 lb

## 2023-12-04 DIAGNOSIS — C641 Malignant neoplasm of right kidney, except renal pelvis: Secondary | ICD-10-CM

## 2023-12-04 NOTE — Progress Notes (Signed)
 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  PRIOR THERAPY:  1) Status post right frontal craniotomy and resection of solitary brain metastasis in March 2021 followed by postoperative SRS at [facility]. 2) Status post right radical nephrectomy on Oct 16, 2019 with the final pathologic stage of pT3a, pN0.  The final pathology showed indeterminant subtype with nuclear grade 3. 3) Status post stereotactic radiotherapy on March 03, 2020 to metastatic brain metastasis when the patient developed 2.0 cm mass in the right frontal lobe consistent with recurrent disease. 4) Status post SRS on February 08, 2021 for another isolated left frontal lobe metastasis. 5) palliative radiotherapy to the soft tissue nodule along the superior margin of the nephrectomy bed that was hypermetabolic on the PET scan under the care of Dr. Dewey.  CURRENT THERAPY: Observation.  INTERVAL HISTORY: Jacqueline Palmer 78 y.o. female returns to the clinic today for follow-up visit.Discussed the use of AI scribe software for clinical note transcription with the patient, who gave verbal consent to proceed.  History of Present Illness Jacqueline Palmer is a 78 year old female with stage four papillary renal cell carcinoma who presents for evaluation and repeat CT scan for restaging.  Diagnosed with stage four papillary renal cell carcinoma in January 2021, she has undergone several treatments and is currently in an observation period. She is here for evaluation and a repeat CT scan of the chest, abdomen, and pelvis to restage her disease.  Since her last visit in January, she has experienced no new symptoms such as chest pain, shortness  of breath, nausea, vomiting, diarrhea, or headaches. She did have a single episode of nausea that required an ER visit, where she was given medication that she continues to take, with no recurrence of symptoms.     MEDICAL HISTORY: Past Medical History:  Diagnosis Date   Acute kidney injury (HCC)    Arthritis    Atrial fibrillation (HCC)    Cancer (HCC)    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   Cardiomyopathy [facility])    CHF (congestive heart failure) (HCC)    Chronic a-fib (HCC) 08/14/2016   Chronic anticoagulation 08/14/2016   Coronary artery disease    4v CABG 6/17   Graves disease    Hypertension    Hypothyroidism    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   SN [id number] Model [id number] [From patient's surgical info card]   Presence of permanent cardiac pacemaker    Seizures (HCC)    Thrombus 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 myocardial infarction    Thyroid disease     ALLERGIES:  is allergic to avocado, banana, plantain, ace inhibitors, codeine, prednisone , tape, latex, and pharmabase cosmetic [aquamed].  MEDICATIONS:  Current Outpatient Medications  Medication Sig Dispense Refill   acetaminophen  (TYLENOL ) 500 MG tablet Take 1,000 mg by mouth daily with breakfast.     colchicine  0.6 MG tablet Take 1 tablet (0.6 mg total) by mouth at bedtime. 90 tablet 3   hydrALAZINE  (APRESOLINE ) 10 MG tablet Take 1 tablet (10 mg total) by mouth 3 (three) times daily. 270 tablet 4   levETIRAcetam  (KEPPRA ) 500 MG tablet Take 1 tablet (500 mg total) by  mouth 2 (two) times daily. 180 tablet 3   levothyroxine  (SYNTHROID ) 25 MCG tablet TAKE 1 TABLET BY MOUTH ONCE DAILY IN THE MORNING 30 TO 60 MINUTES BEFORE BREAKFAST ON AN EMPTY STOMACH WITH A FULL GLASS OF WATER  90 tablet 4   methocarbamol  (ROBAXIN ) 500 MG tablet Take 2 tablets (1,000 mg total) by mouth every 8 (eight) hours as needed. For muscle spasm 90 tablet 0   metoprolol  succinate (TOPROL -XL) 25 MG 24 hr tablet Take  1 tablet (25 mg total) by mouth daily. 90 tablet 4   nitroGLYCERIN  (NITROSTAT ) 0.4 MG SL tablet Place 1 tablet (0.4 mg total) under the tongue every 5 (five) minutes as needed for chest pain. 25 tablet 3   ondansetron  (ZOFRAN -ODT) 4 MG disintegrating tablet Take 1 tablet (4 mg total) by mouth every 8 (eight) hours as needed for nausea or vomiting. 20 tablet 0   OVER THE COUNTER MEDICATION Apply 1 application topically 2 (two) times daily. Pain cream in groin area     polyethylene glycol (MIRALAX  / GLYCOLAX ) 17 g packet Take 17 g by mouth daily as needed for mild constipation.     pravastatin  (PRAVACHOL ) 40 MG tablet Take 1 tablet (40 mg total) by mouth daily. 90 tablet 0   spironolactone  (ALDACTONE ) 25 MG tablet Take 1 tablet (25 mg total) by mouth daily. Please schedule appointment with Dr. Lonni for refills. 90 tablet 4   torsemide  (DEMADEX ) 20 MG tablet TAKE 2 TABLETS BY MOUTH EVERY MORNING AND 1 TABLET EVERY EVENING 270 tablet 4   warfarin (COUMADIN ) 5 MG tablet TAKE ONE TABLET BY MOUTH ONCE A DAY IN THE EVENING OR AS DIRECTED BY THE COUMADIN  CLINIC 100 tablet 0   No current facility-administered medications for this visit.    SURGICAL HISTORY:  Past Surgical History:  Procedure Laterality Date   ABDOMINAL HYSTERECTOMY     CORONARY ARTERY BYPASS GRAFT     CRANIOTOMY Right 07/30/2019   Right frontal   NEPHRECTOMY Right 10/16/2019   TOTAL HIP ARTHROPLASTY Right 07/11/2021   Procedure: RIGHT TOTAL HIP ARTHROPLASTY ANTERIOR APPROACH;  Surgeon: Liam Lerner, MD;  Location: [facility];  Service: Orthopedics;  Laterality: Right;    REVIEW OF SYSTEMS:  A comprehensive review of systems was negative except for: Constitutional: positive for fatigue Gastrointestinal: positive for nausea Musculoskeletal: positive for arthralgias   PHYSICAL EXAMINATION: General appearance: alert, cooperative, fatigued, and no distress Head: Normocephalic, without obvious abnormality, atraumatic Neck: no  adenopathy, no JVD, supple, symmetrical, trachea midline, and thyroid not enlarged, symmetric, no tenderness/mass/nodules Lymph nodes: Cervical, supraclavicular, and axillary nodes normal. Resp: clear to auscultation bilaterally Back: symmetric, no curvature. ROM normal. No CVA tenderness. Cardio: regular rate and rhythm, S1, S2 normal, no murmur, click, rub or gallop GI: soft, non-tender; bowel sounds normal; no masses,  no organomegaly Extremities: extremities normal, atraumatic, no cyanosis or edema  ECOG PERFORMANCE STATUS: 1 - Symptomatic but completely ambulatory  Blood pressure 106/75, pulse 70, temperature 98.2 F (36.8 C), temperature source Temporal, resp. rate 16, height 5' 8 (1.727 m), weight 189 lb (85.7 kg), SpO2 100%.  LABORATORY DATA: Lab Results  Component Value Date   WBC 5.5 11/27/2023   HGB 11.9 (L) 11/27/2023   HCT 35.2 (L) 11/27/2023   MCV 72.7 (L) 11/27/2023   PLT 223 11/27/2023      Chemistry      Component Value Date/Time   NA 135 11/27/2023 0959   NA 141 12/14/2020 1053   K 5.0 11/27/2023 0959  CL 105 11/27/2023 0959   CO2 23 11/27/2023 0959   BUN 38 (H) 11/27/2023 0959   BUN 45 (H) 12/14/2020 1053   CREATININE 2.23 (H) 11/27/2023 0959      Component Value Date/Time   CALCIUM 9.7 11/27/2023 0959   ALKPHOS 84 11/27/2023 0959   AST 23 11/27/2023 0959   ALT 20 11/27/2023 0959   BILITOT 0.7 11/27/2023 0959       RADIOGRAPHIC STUDIES: CUP PACEART REMOTE DEVICE CHECK Result Date: 11/30/2023 ICD Scheduled remote reviewed. Normal device function.  Presenting rhythm:  AFL/BiV pace Known AFL, Warfarin per PA report Next remote 91 days. LA, CVRSMRI Protection last programmed on Sep 17, 2023. Beeper is OFF.  CT Chest Wo Contrast Result Date: 11/28/2023 CLINICAL DATA:  Renal cell carcinoma.  Restaging. EXAM: CT CHEST, ABDOMEN AND PELVIS WITHOUT CONTRAST TECHNIQUE: Multidetector CT imaging of the chest, abdomen and pelvis was performed following the  standard protocol without IV contrast. RADIATION DOSE REDUCTION: This exam was performed according to the departmental dose-optimization program which includes automated exposure control, adjustment of the mA and/or kV according to patient size and/or use of iterative reconstruction technique. COMPARISON:  CT dated 06/07/2023. FINDINGS: Evaluation of this exam is limited in the absence of intravenous contrast. CT CHEST FINDINGS Cardiovascular: There is no cardiomegaly or pericardial effusion. There is coronary vascular calcification. Mechanical mitral valve. An AICD device is noted. Moderate atherosclerotic calcification of the thoracic aorta. No S1 dilatation. The central pulmonary arteries are grossly unremarkable. Mediastinum/Nodes: No hilar or mediastinal adenopathy. The esophagus is grossly unremarkable. No mediastinal fluid collection. Lungs/Pleura: Mild emphysema. Similar appearance of a 3 mm right middle lobe subpleural nodule. No new or suspicious nodules. No focal consolidation, pleural effusion, or pneumothorax. The central airways are patent. Musculoskeletal: Median sternotomy wires. Osteopenia. No acute osseous pathology. CT ABDOMEN PELVIS FINDINGS No intra-abdominal free air or free fluid. Hepatobiliary: The liver is unremarkable. No biliary dilatation. Small gallstones. No pericholecystic fluid or evidence of acute cholecystitis by CT. Pancreas: Unremarkable. No pancreatic ductal dilatation or surrounding inflammatory changes. Spleen: Normal in size without focal abnormality. Adrenals/Urinary Tract: The adrenal glands are unremarkable. Status post prior right nephrectomy. Similar or minimally decreased size of the right suprarenal nodule. No new nodule. The left kidney, visualized left ureter, and urinary bladder appear unremarkable. Stomach/Bowel: Small hiatal hernia. Mild scattered colonic diverticula. There is no bowel obstruction or active inflammation. The appendix is normal. Vascular/Lymphatic:  Moderate aortoiliac atherosclerotic disease. The IVC is unremarkable. No portal venous gas. There is no adenopathy. Reproductive: Hysterectomy.  No suspicious adnexal masses. Other: None Musculoskeletal: Osteopenia with degenerative changes of the spine. Total right hip arthroplasty. No acute osseous pathology. IMPRESSION: 1. Status post prior right nephrectomy. Similar or minimally decreased size of the right suprarenal nodule. No new nodule. 2. No new metastatic disease in the chest, abdomen, or pelvis. 3. Cholelithiasis. 4. Mild colonic diverticulosis. No bowel obstruction. Normal appendix. 5. Aortic Atherosclerosis (ICD10-I70.0) and Emphysema (ICD10-J43.9). Electronically Signed   By: Vanetta Chou M.D.   On: 11/28/2023 11:21   CT ABDOMEN PELVIS WO CONTRAST Result Date: 11/28/2023 CLINICAL DATA:  Renal cell carcinoma.  Restaging. EXAM: CT CHEST, ABDOMEN AND PELVIS WITHOUT CONTRAST TECHNIQUE: Multidetector CT imaging of the chest, abdomen and pelvis was performed following the standard protocol without IV contrast. RADIATION DOSE REDUCTION: This exam was performed according to the departmental dose-optimization program which includes automated exposure control, adjustment of the mA and/or kV according to patient size and/or use of iterative reconstruction  technique. COMPARISON:  CT dated 06/07/2023. FINDINGS: Evaluation of this exam is limited in the absence of intravenous contrast. CT CHEST FINDINGS Cardiovascular: There is no cardiomegaly or pericardial effusion. There is coronary vascular calcification. Mechanical mitral valve. An AICD device is noted. Moderate atherosclerotic calcification of the thoracic aorta. No S1 dilatation. The central pulmonary arteries are grossly unremarkable. Mediastinum/Nodes: No hilar or mediastinal adenopathy. The esophagus is grossly unremarkable. No mediastinal fluid collection. Lungs/Pleura: Mild emphysema. Similar appearance of a 3 mm right middle lobe subpleural nodule.  No new or suspicious nodules. No focal consolidation, pleural effusion, or pneumothorax. The central airways are patent. Musculoskeletal: Median sternotomy wires. Osteopenia. No acute osseous pathology. CT ABDOMEN PELVIS FINDINGS No intra-abdominal free air or free fluid. Hepatobiliary: The liver is unremarkable. No biliary dilatation. Small gallstones. No pericholecystic fluid or evidence of acute cholecystitis by CT. Pancreas: Unremarkable. No pancreatic ductal dilatation or surrounding inflammatory changes. Spleen: Normal in size without focal abnormality. Adrenals/Urinary Tract: The adrenal glands are unremarkable. Status post prior right nephrectomy. Similar or minimally decreased size of the right suprarenal nodule. No new nodule. The left kidney, visualized left ureter, and urinary bladder appear unremarkable. Stomach/Bowel: Small hiatal hernia. Mild scattered colonic diverticula. There is no bowel obstruction or active inflammation. The appendix is normal. Vascular/Lymphatic: Moderate aortoiliac atherosclerotic disease. The IVC is unremarkable. No portal venous gas. There is no adenopathy. Reproductive: Hysterectomy.  No suspicious adnexal masses. Other: None Musculoskeletal: Osteopenia with degenerative changes of the spine. Total right hip arthroplasty. No acute osseous pathology. IMPRESSION: 1. Status post prior right nephrectomy. Similar or minimally decreased size of the right suprarenal nodule. No new nodule. 2. No new metastatic disease in the chest, abdomen, or pelvis. 3. Cholelithiasis. 4. Mild colonic diverticulosis. No bowel obstruction. Normal appendix. 5. Aortic Atherosclerosis (ICD10-I70.0) and Emphysema (ICD10-J43.9). Electronically Signed   By: Vanetta Chou M.D.   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 The patient has the following treatment: 1) status post right frontal craniotomy and resection of solitary brain metastasis in March 2021 followed by postoperative SRS at [facility]. 2) Status post right radical nephrectomy on Oct 16, 2019 with the final pathologic stage of pT3a, pN0.  The final pathology showed indeterminant subtype with nuclear grade 3. 3) Status post stereotactic radiotherapy on March 03, 2020 to metastatic brain metastasis when the patient developed 2.0 cm mass in the right frontal lobe consistent with recurrent disease. 4) Status post SRS on February 08, 2021 for another isolated left frontal lobe metastasis. 5) palliative radiotherapy to the hypermetabolic soft tissue nodule in the right nephrectomy bed under the care of Dr. Dewey The patient had repeat CT scan of the chest, abdomen pelvis performed recently.  I personally and independently reviewed the scan and discussed the results with the patient today.  Her scan showed no concerning findings for disease progression. Assessment and Plan Assessment & Plan Stage 4 Papillary Renal Cell Carcinoma Stage 4 papillary renal cell carcinoma diagnosed in January 2021. Currently in an observation period following several treatments. Recent CT scan of the chest, abdomen, and pelvis shows well-managed disease with no evidence of growth or metastasis. No new symptoms reported, except for a single episode of nausea managed in the ER, with no recurrence since treatment. - Continue observation period -  Schedule follow-up appointment in 6 months She was advised to call immediately if she has any other concerning symptoms in the interval. The patient voices understanding of current disease status and treatment options and is in agreement with the current care plan.  All questions were answered. The patient knows to call the clinic with any problems, questions or concerns. We can certainly see  the patient much sooner if necessary.  The total time spent in the appointment was 20 minutes.  Disclaimer: This note was dictated with voice recognition software. Similar sounding words can inadvertently be transcribed and may not be corrected upon review.

## 2023-12-05 ENCOUNTER — Telehealth: Payer: Self-pay | Admitting: Internal Medicine

## 2023-12-05 NOTE — Telephone Encounter (Signed)
 Called & spoke to Malaga H at [employer]. No prior authorization required. Please see the following information.   No prior auth req CPT codes: 22919, [id number], [id number] Call ref #: [id number] Representative: Charmaine HILARIO MERLIN.

## 2023-12-05 NOTE — Telephone Encounter (Signed)
Scheduled appointments with the patient

## 2023-12-06 ENCOUNTER — Ambulatory Visit: Attending: Cardiology | Admitting: *Deleted

## 2023-12-06 DIAGNOSIS — I482 Chronic atrial fibrillation, unspecified: Secondary | ICD-10-CM | POA: Insufficient documentation

## 2023-12-06 DIAGNOSIS — Z7901 Long term (current) use of anticoagulants: Secondary | ICD-10-CM | POA: Diagnosis present

## 2023-12-06 DIAGNOSIS — Z5181 Encounter for therapeutic drug level monitoring: Secondary | ICD-10-CM | POA: Insufficient documentation

## 2023-12-06 LAB — POCT INR: POC INR: 2.7

## 2023-12-06 NOTE — Progress Notes (Signed)
Please see anticoagulation encounter.

## 2023-12-06 NOTE — Patient Instructions (Signed)
 Description   Continue taking Warfarin 1 tablet daily except 1/2 tablet on Sundays.  Recheck INR in 5 weeks.  Coumadin Clinic [phone]

## 2023-12-10 ENCOUNTER — Ambulatory Visit (HOSPITAL_BASED_OUTPATIENT_CLINIC_OR_DEPARTMENT_OTHER)
Admission: RE | Admit: 2023-12-10 | Discharge: 2023-12-10 | Disposition: A | Discharge status: Home / Self Care | Source: Ambulatory Visit | Attending: Internal Medicine | Admitting: Internal Medicine

## 2023-12-10 DIAGNOSIS — M8580 Other specified disorders of bone density and structure, unspecified site: Secondary | ICD-10-CM | POA: Insufficient documentation

## 2023-12-10 DIAGNOSIS — Z780 Asymptomatic menopausal state: Secondary | ICD-10-CM | POA: Diagnosis present

## 2023-12-12 ENCOUNTER — Other Ambulatory Visit: Payer: Self-pay | Admitting: Internal Medicine

## 2023-12-12 DIAGNOSIS — M810 Age-related osteoporosis without current pathological fracture: Secondary | ICD-10-CM

## 2023-12-31 ENCOUNTER — Other Ambulatory Visit: Payer: Self-pay | Admitting: Physician Assistant

## 2023-12-31 DIAGNOSIS — I2510 Atherosclerotic heart disease of native coronary artery without angina pectoris: Secondary | ICD-10-CM

## 2024-01-10 ENCOUNTER — Ambulatory Visit: Attending: Cardiology | Admitting: *Deleted

## 2024-01-10 DIAGNOSIS — Z7901 Long term (current) use of anticoagulants: Secondary | ICD-10-CM | POA: Insufficient documentation

## 2024-01-10 DIAGNOSIS — I482 Chronic atrial fibrillation, unspecified: Secondary | ICD-10-CM | POA: Diagnosis present

## 2024-01-10 LAB — POCT INR: INR: 3.4 — AB (ref 2.0–3.0)

## 2024-01-10 NOTE — Patient Instructions (Signed)
 Description   Do not take any warfarin then START taking Warfarin 1 tablet daily except 1/2 tablet on Sundays and Thursdays.  Recheck INR in 4 weeks.  Coumadin  Clinic [phone]

## 2024-01-10 NOTE — Progress Notes (Signed)
 INR-3.4;  Do not take any warfarin then START taking Warfarin 1 tablet daily except 1/2 tablet on Sundays and Thursdays.  Recheck INR in 4 weeks.  Coumadin  Clinic [phone]

## 2024-01-17 ENCOUNTER — Encounter: Payer: Self-pay | Admitting: Internal Medicine

## 2024-01-19 ENCOUNTER — Other Ambulatory Visit: Payer: Self-pay | Admitting: Cardiology

## 2024-01-19 DIAGNOSIS — I482 Chronic atrial fibrillation, unspecified: Secondary | ICD-10-CM

## 2024-01-23 ENCOUNTER — Telehealth: Payer: Self-pay | Admitting: Internal Medicine

## 2024-01-23 ENCOUNTER — Other Ambulatory Visit: Payer: Self-pay | Admitting: Internal Medicine

## 2024-01-23 DIAGNOSIS — I10 Essential (primary) hypertension: Secondary | ICD-10-CM

## 2024-01-23 NOTE — Telephone Encounter (Signed)
 Copied from CRM [id number]. Topic: Clinical - Prescription Issue >> Jan 23, 2024  3:22 PM Willma R wrote: Reason for CRM: Delon from [facility] pharmacy calling in regards to patients prescription for levothyroxine  (SYNTHROID ) 25 MCG tablet. States that manufacturer is changing and need approval from the MD before the switch.  Delon can be reached at [phone]

## 2024-01-24 NOTE — Telephone Encounter (Signed)
 Called [facility] and spoke to Harris, informed that it is ok to make the switch from levothyroxine . Will order blood work in upcoming November appointment. Danielle expressed verbal understanding.

## 2024-01-24 NOTE — Telephone Encounter (Signed)
 Labs in date  Requested Prescriptions  Pending Prescriptions Disp Refills   spironolactone  (ALDACTONE ) 25 MG tablet [Pharmacy Med Name: Spironolactone  Oral Tablet 25 MG] 90 tablet 0    Sig: TAKE ONE TABLET BY MOUTH ONCE A DAY     Cardiovascular: Diuretics - Aldosterone Antagonist Failed - 01/24/2024 10:19 AM      Failed - Cr in normal range and within 180 days    Creatinine  Date Value Ref Range Status  11/27/2023 2.23 (H) 0.44 - 1.00 mg/dL Final         Failed - eGFR is 30 or above and within 180 days    GFR, Est AFR Am  Date Value Ref Range Status  01/20/2020 30 (L) >60 mL/min Final   GFR, Estimated  Date Value Ref Range Status  11/27/2023 22 (L) >60 mL/min Final    Comment:    (NOTE) Calculated using the CKD-EPI Creatinine Equation (2021)    eGFR  Date Value Ref Range Status  07/28/2022 22  Final    Comment:    Abstracted by HIM         Passed - K in normal range and within 180 days    Potassium  Date Value Ref Range Status  11/27/2023 5.0 3.5 - 5.1 mmol/L Final         Passed - Na in normal range and within 180 days    Sodium  Date Value Ref Range Status  11/27/2023 135 135 - 145 mmol/L Final  12/14/2020 141 134 - 144 mmol/L Final         Passed - Last BP in normal range    BP Readings from Last 1 Encounters:  12/04/23 106/75         Passed - Valid encounter within last 6 months    Recent Outpatient Visits           2 months ago Essential hypertension   [facility] [place] [facility] [place]. [facility] Vicci Sober B, MD   5 months ago Coronary artery disease involving native coronary artery of native heart without angina pectoris   [facility] [place] [facility] Of [place]. [facility] [place], Jon M, [place]   7 months ago History of recent fall   [facility] [facility] [place] [facility] Of [place]. [facility] Vicci Sober NOVAK, MD   1 year ago Essential hypertension   [place] [facility] [place] [facility] [place] [facility] Of [place]. [facility] Vicci Sober NOVAK, MD   1 year ago Essential hypertension   [facility] [place] [facility] Of [place]. [facility] Vicci Sober NOVAK, MD       Future Appointments             In 2 months Vicci Sober NOVAK, MD [facility] [place] - A Dept Of [place]. [facility], [place]

## 2024-01-24 NOTE — Telephone Encounter (Signed)
 Let [facility] know that it is okay to make the switch. Pt has appt with me in early November so I would be able to check TSH at that time.

## 2024-01-28 ENCOUNTER — Telehealth: Payer: Self-pay | Admitting: Internal Medicine

## 2024-01-28 NOTE — Telephone Encounter (Signed)
 Copied from CRM [id number]. Topic: General - Other >> Jan 28, 2024  3:30 PM Jasmin G wrote: Reason for CRM: Staff to check on the status of a fax that was sent over to clinic on 8/29 regarding pt's OT treatment, I could not find info to relay, please call back at [phone] to give a status/

## 2024-01-30 ENCOUNTER — Telehealth: Payer: Self-pay

## 2024-01-30 NOTE — Telephone Encounter (Signed)
 Copied from CRM [id number]. Topic: General - Other >> Jan 30, 2024 10:23 AM Pinkey ORN wrote: Reason for CRM: Returning Missed Office Call >> Jan 30, 2024 10:24 AM Pinkey ORN wrote: Patient is returning missed office call from Marvis Bradley, RN.

## 2024-01-30 NOTE — Telephone Encounter (Signed)
 I called the patient to inquire why she is requesting OT and she said they will help her arrange items in her apartment to make it safer for her to walk/move around.  I asked her why PT didn't help with this and she did not provide a direct answer. She said she really needs this order signed so the OT can help her.   Then she said she needs ST also because she needs help completing 3 documents ( a will and tax documents) and the ST has already been working on the documents and she did not want to have to work with anyone else and explain what has already been discussed with the ST.  She then said that the ST may be able to help with the documents and move furniture because  they all do it all.  I explained that they have different roles and she said their jobs can crossover but it will be up to [place], who does the assignments, to approve the ST moving.   I asked her if she has to pay for any part of this and she said no, her insurance has paid for everything.   I told her that I will need to speak to [facility] for more information for Dr Vicci. I then called [facility] and left a message for Earnie requesting a call back

## 2024-01-30 NOTE — Telephone Encounter (Signed)
 The return call to patient is documented in another telephone encounter today

## 2024-01-30 NOTE — Telephone Encounter (Signed)
 I called the patient to confirm that she is requesting OT services and her voicemail was full.

## 2024-01-30 NOTE — Telephone Encounter (Signed)
 Sent to wrong office

## 2024-01-30 NOTE — Telephone Encounter (Signed)
 What is the OT needed for? Jacqueline Palmer would I have to see her prior to signing off on this? Pt was in P.T with them for several mths. I requested a call back from them after about 4 mths of it and they never called back so I quit signing the order. Pt then came to see me 11/2023 and told me that P.T was completed.

## 2024-01-31 NOTE — Telephone Encounter (Unsigned)
 Copied from CRM #[id number]. Topic: General - Other >> Jan 31, 2024  9:35 AM Carlatta H wrote: Reason for CRM: Nurse Slater please call Earnie at [phone] and leave a detailed message if she does not answer

## 2024-01-31 NOTE — Telephone Encounter (Signed)
 I cannot approve speech therapy for her to have help completing a wheel and tax document.  There has to be a legitimate medical reason for speech therapy.

## 2024-02-07 ENCOUNTER — Other Ambulatory Visit (HOSPITAL_COMMUNITY): Payer: Self-pay

## 2024-02-07 ENCOUNTER — Ambulatory Visit: Attending: Cardiology

## 2024-02-07 ENCOUNTER — Other Ambulatory Visit (HOSPITAL_BASED_OUTPATIENT_CLINIC_OR_DEPARTMENT_OTHER): Payer: Self-pay

## 2024-02-07 DIAGNOSIS — Z7901 Long term (current) use of anticoagulants: Secondary | ICD-10-CM | POA: Diagnosis present

## 2024-02-07 DIAGNOSIS — I482 Chronic atrial fibrillation, unspecified: Secondary | ICD-10-CM | POA: Diagnosis not present

## 2024-02-07 LAB — POCT INR: INR: 2.9 (ref 2.0–3.0)

## 2024-02-07 MED ORDER — COMIRNATY 30 MCG/0.3ML IM SUSY
PREFILLED_SYRINGE | INTRAMUSCULAR | 0 refills | Status: AC
  Filled 2024-02-07: qty 0.3, 1d supply, fill #0

## 2024-02-07 NOTE — Patient Instructions (Signed)
 Continue Warfarin 1 tablet daily except 1/2 tablet on Sundays and Thursdays.  Recheck INR in 5 weeks.  Coumadin  Clinic [phone]

## 2024-02-07 NOTE — Telephone Encounter (Signed)
 Can do a short duration of OT to assess and eval for safety in her home/apartment.

## 2024-02-07 NOTE — Progress Notes (Signed)
 INR 2.9 Please see anticoagulation encounter Continue Warfarin 1 tablet daily except 1/2 tablet on Sundays and Thursdays.  Recheck INR in 5 weeks.  Coumadin  Clinic [phone]

## 2024-02-07 NOTE — Telephone Encounter (Signed)
 I called the patient and informed her that Dr Vicci is not going to approve the ST and why she is not approving it.  The patient stated that was okay. I then told her that the OT is being approved for short term services and the patient was very pleased.  Orders for OT efaxed to [employer]: [phone]. I also called Maria/ [facility] and left her a message with my call back number if she has questions.

## 2024-02-08 ENCOUNTER — Telehealth: Payer: Self-pay

## 2024-02-08 NOTE — Telephone Encounter (Signed)
 Copied from CRM [id number]. Topic: General - Other >> Feb 08, 2024  1:51 PM Delon DASEN wrote: Reason for CRM: Jacqueline Palmer with [employer] returning call from [place]- [phone]

## 2024-02-13 NOTE — Progress Notes (Unsigned)
 Cardiology Office Note Date:  02/13/2024  Patient ID:  Danell, Vazquez 1945/08/15, MRN [id number] PCP:  [name], MD  Cardiologist:  Dr. Lonni Electrophysiologist: Dr. Cindie     Chief Complaint: over due, needs device clearance, pre-XRT.  History of Present Illness: CLARITZA JULY is a 78 y.o. 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 Cindie to establish device care.  She saw A. Tillery, PAc, 09/01/21, doing well without device concerns.  No changes were made, advised close monitoring of her renal function, spironolactone  and K+  Following with gen cards team regularly, last with Dr. Lonni, 03/17/22, recovering from a semi-recent THA, no CV concerns, symptoms. With single kidney/her history, not on ACE/ARB, deferred to nephrology clearance and any advancement of her GDMT.  Saw heme-onc 09/21/22, scan showed 2.3 cm soft tissue nodule along the superior aspect of the right nephrectomy bed suspicious for recurrent disease. Planned for PET and early follow up  10/12/22 oncology f/u PET scan showed the soft tissue nodule along the margin exhibits intense FDG uptake which is compatible with local tumor recurrence. Discussed discussed possible biopsy vs. starting systemic immunotherapy with ipilimumab and nivolumab.  Dr. Sherrod discussed the side effects of immunotherapy.  The patient is likely not a surgical candidate due to her comorbidities and CKD. Pt did not want to pursue chemo, though would consider XRT (not felt an ideal option clinically)  Saw neurology 10/10/22, stable with no changes made.  No cardiac concerns/symptoms  She  saw Dr. Cindie   I saw her 10/26/22 All in all doing OK No CP, palpitations,  denies SOB, DOE No bleeding or signs of bleeding, follows with our coumadin  clinic No near syncope or syncope. No device concerns Undergoing XRT for RCC Sees multiple MDs, all do labs,  nephrologist is Dr. Dennise, keeps close track of her as well.  Saw cards team 11/21/22, no cardiac concerns, complaints, adjustment made to her statin.  Saw Dr. Cindie 01/25/24, [facility], walking regularly and participating in social events regularly, no cardiac, device concerns/symptoms Stable device findings   TODAY  *** GORE lead *** programmed OK *** symptoms *** BP% *** AV node ablation *** warfarin, ? Us  *** volume ....     Device information BSCi CRT-D implanted 09/11/2016   Past Medical History:  Diagnosis Date   Acute kidney injury    Arthritis    Atrial fibrillation (HCC)    Cancer (HCC)    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   Cardiomyopathy [facility])    CHF (congestive heart failure) (HCC)    Chronic a-fib (HCC) 08/14/2016   Chronic anticoagulation 08/14/2016   Coronary artery disease    4v CABG 6/17   Graves disease    Hypertension    Hypothyroidism    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   SN [id number] Model [id number] [From patient's surgical info card]   Presence of permanent cardiac pacemaker    Seizures (HCC)    Thrombus 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 myocardial infarction    Thyroid disease     Past Surgical History:  Procedure Laterality Date   ABDOMINAL HYSTERECTOMY  CORONARY ARTERY BYPASS GRAFT     CRANIOTOMY Right 07/30/2019   Right frontal   NEPHRECTOMY Right 10/16/2019   TOTAL HIP ARTHROPLASTY Right 07/11/2021   Procedure: RIGHT TOTAL HIP ARTHROPLASTY ANTERIOR APPROACH;  Surgeon: Liam Lerner, MD;  Location: [facility];  Service: Orthopedics;  Laterality: Right;    Current Outpatient Medications  Medication Sig Dispense Refill    acetaminophen  (TYLENOL ) 500 MG tablet Take 1,000 mg by mouth daily with breakfast.     colchicine  0.6 MG tablet Take 1 tablet (0.6 mg total) by mouth at bedtime. 90 tablet 3   COVID-19 mRNA vaccine, Pfizer, (COMIRNATY ) syringe Inject into the muscle. 0.5 mL 0   hydrALAZINE  (APRESOLINE ) 10 MG tablet Take 1 tablet (10 mg total) by mouth 3 (three) times daily. 270 tablet 4   levETIRAcetam  (KEPPRA ) 500 MG tablet Take 1 tablet (500 mg total) by mouth 2 (two) times daily. 180 tablet 3   levothyroxine  (SYNTHROID ) 25 MCG tablet TAKE 1 TABLET BY MOUTH ONCE DAILY IN THE MORNING 30 TO 60 MINUTES BEFORE BREAKFAST ON AN EMPTY STOMACH WITH A FULL GLASS OF WATER  90 tablet 4   methocarbamol  (ROBAXIN ) 500 MG tablet Take 2 tablets (1,000 mg total) by mouth every 8 (eight) hours as needed. For muscle spasm 90 tablet 0   metoprolol  succinate (TOPROL -XL) 25 MG 24 hr tablet Take 1 tablet (25 mg total) by mouth daily. 90 tablet 4   nitroGLYCERIN  (NITROSTAT ) 0.4 MG SL tablet Place 1 tablet (0.4 mg total) under the tongue every 5 (five) minutes as needed for chest pain. 25 tablet 3   ondansetron  (ZOFRAN -ODT) 4 MG disintegrating tablet Take 1 tablet (4 mg total) by mouth every 8 (eight) hours as needed for nausea or vomiting. 20 tablet 0   OVER THE COUNTER MEDICATION Apply 1 application topically 2 (two) times daily. Pain cream in groin area     polyethylene glycol (MIRALAX  / GLYCOLAX ) 17 g packet Take 17 g by mouth daily as needed for mild constipation.     pravastatin  (PRAVACHOL ) 40 MG tablet TAKE ONE TABLET BY MOUTH ONCE A DAY 90 tablet 2   spironolactone  (ALDACTONE ) 25 MG tablet TAKE ONE TABLET BY MOUTH ONCE A DAY 90 tablet 0   torsemide  (DEMADEX ) 20 MG tablet TAKE 2 TABLETS BY MOUTH EVERY MORNING AND 1 TABLET EVERY EVENING 270 tablet 4   warfarin (COUMADIN ) 5 MG tablet TAKE ONE TABLET BY MOUTH ONCE A DAY IN THE EVENING OR AS DIRECTED BY THE COUMADIN  CLINIC 100 tablet 0   No current facility-administered medications for  this visit.    Allergies:   Avocado, Banana, Plantain, Ace inhibitors, Codeine, Prednisone , Tape, Latex, and Pharmabase cosmetic [aquamed]   Social History:  The patient  reports that she quit smoking about 9 years ago. Her smoking use included cigarettes. She has never used smokeless tobacco. She reports current alcohol use. She reports that she does not use drugs.   Family History:  The patient's family history includes Heart attack in her father, mother, and sister.  ROS:  Please see the history of present illness.    All other systems are reviewed and otherwise negative.   PHYSICAL EXAM:  VS:  There were no vitals taken for this visit. BMI: There is no height or weight on file to calculate BMI. Well nourished, well developed, in no acute distress HEENT: normocephalic, atraumatic Neck: no JVD, carotid bruits or masses Cardiac: *** RRR; (paced)no significant murmurs, no rubs, or gallops Lungs:*** CTA b/l, no wheezing,  rhonchi or rales Abd: soft, nontender MS: no deformity or atrophy Ext: *** no edema Skin: warm and dry, no rash Neuro:  No gross deficits appreciated Psych: euthymic mood, full affect  *** ICD site is stable, no tethering or discomfort   EKG:  not done today  Device interrogation done today and reviewed by myself:  *** Battery and lead measurements are good ***    Echo 12/03/20 1. Left ventricular ejection fraction, by estimation, is 60 to 65%. The  left ventricle has normal function. The left ventricle has no regional  wall motion abnormalities. Left ventricular diastolic function could not  be evaluated.   2. Right ventricular systolic function is normal. The right ventricular  size is not well visualized. There is normal pulmonary artery systolic  pressure. The estimated right ventricular systolic pressure is 25.8 mmHg.   3. Left atrial size was moderately dilated.   4. Right atrial size was mildly dilated.   5. The mitral valve has been  repaired/replaced. No evidence of mitral  valve regurgitation. The mean mitral valve gradient is 3.0 mmHg. There is  a present in the mitral position.   6. The aortic valve is tricuspid. Aortic valve regurgitation is trivial.  No aortic stenosis is present.   7. Aortic dilatation noted. There is mild dilatation of the ascending  aorta, measuring 42 mm.   8. The inferior vena cava is normal in size with greater than 50%  respiratory variability, suggesting right atrial pressure of 3 mmHg.  Recent Labs: 07/31/2023: Magnesium  2.0 11/20/2023: TSH 3.460 11/27/2023: ALT 20; BUN 38; Creatinine 2.23; Hemoglobin 11.9; Platelet Count 223; Potassium 5.0; Sodium 135  No results found for requested labs within last 365 days.   CrCl cannot be calculated (Patient's most recent lab result is older than the maximum 21 days allowed.).   Wt Readings from Last 3 Encounters:  12/04/23 189 lb (85.7 kg)  11/20/23 187 lb (84.8 kg)  09/20/23 186 lb 14.4 oz (84.8 kg)     Other studies reviewed: Additional studies/records reviewed today include: summarized above  ASSESSMENT AND PLAN:  CRT-D *** Intact function *** No programming changes made  RV lead Is on advisory (single coil) *** Ohms *** programmed appropriately    Permanent AFib CHA2DS2Vasc is 6, on warfarin, monitored and managed by us  Hx of AVN ablation *** % paced  CAD *** No anginal symptoms  *** On BB/statin, no ASA w/warfarin C/w Dr. Lucinda  VHD S/p MVR (bioprosthetic)  NICM *** Recovered LVEF (by echo 2022) No symptoms or exam findings of volume OL Heart logic score is *** C/w Dr. Christopher/nephrology/teams  6.  Secondary hypercoagulable state   Disposition: ***     Current medicines are reviewed at length with the patient today.  The patient did not have any concerns regarding medicines.  Bonney Charlies Arthur, PA-C 02/13/2024 1:01 PM     [facility] [address] [place]  [address] [phone] (office)  [phone] (fax)

## 2024-02-14 ENCOUNTER — Telehealth: Payer: Self-pay | Admitting: *Deleted

## 2024-02-14 ENCOUNTER — Ambulatory Visit: Attending: Cardiology | Admitting: Physician Assistant

## 2024-02-14 ENCOUNTER — Encounter: Payer: Self-pay | Admitting: Physician Assistant

## 2024-02-14 VITALS — BP 112/68 | Ht 68.0 in | Wt 184.0 lb

## 2024-02-14 DIAGNOSIS — Z95810 Presence of automatic (implantable) cardiac defibrillator: Secondary | ICD-10-CM | POA: Diagnosis not present

## 2024-02-14 DIAGNOSIS — Z952 Presence of prosthetic heart valve: Secondary | ICD-10-CM | POA: Insufficient documentation

## 2024-02-14 DIAGNOSIS — I428 Other cardiomyopathies: Secondary | ICD-10-CM | POA: Insufficient documentation

## 2024-02-14 DIAGNOSIS — I4821 Permanent atrial fibrillation: Secondary | ICD-10-CM | POA: Insufficient documentation

## 2024-02-14 DIAGNOSIS — I2510 Atherosclerotic heart disease of native coronary artery without angina pectoris: Secondary | ICD-10-CM | POA: Diagnosis present

## 2024-02-14 LAB — CUP PACEART INCLINIC DEVICE CHECK
Date Time Interrogation Session: 20250925104413
HighPow Impedance: 74 Ohm
Implantable Lead Connection Status: 753985
Implantable Lead Connection Status: 753985
Implantable Lead Connection Status: 753985
Implantable Lead Implant Date: 20180423
Implantable Lead Implant Date: 20180423
Implantable Lead Implant Date: 20180423
Implantable Lead Location: 753858
Implantable Lead Location: 753859
Implantable Lead Location: 753860
Implantable Lead Model: 292
Implantable Lead Model: 4672
Implantable Lead Model: 7740
Implantable Lead Serial Number: 431793
Implantable Lead Serial Number: 602825
Implantable Lead Serial Number: 703305
Implantable Pulse Generator Implant Date: 20180423
Lead Channel Impedance Value: 1048 Ohm
Lead Channel Impedance Value: 483 Ohm
Lead Channel Impedance Value: 541 Ohm
Lead Channel Pacing Threshold Amplitude: 0.5 V
Lead Channel Pacing Threshold Amplitude: 0.5 V
Lead Channel Pacing Threshold Pulse Width: 0.4 ms
Lead Channel Pacing Threshold Pulse Width: 0.4 ms
Lead Channel Setting Pacing Amplitude: 1.7 V
Lead Channel Setting Pacing Amplitude: 2 V
Lead Channel Setting Pacing Pulse Width: 0.4 ms
Lead Channel Setting Pacing Pulse Width: 0.4 ms
Lead Channel Setting Sensing Sensitivity: 0.6 mV
Lead Channel Setting Sensing Sensitivity: 1 mV
Pulse Gen Serial Number: 174046
Zone Setting Status: 755011

## 2024-02-14 NOTE — Telephone Encounter (Signed)
 OT Evaluation & Plan of Treatment signed by Dr Buckley & returned to [facility], fax #[phone].  Fax confirmation received.

## 2024-02-14 NOTE — Patient Instructions (Addendum)
 Medication Instructions:   Your physician recommends that you continue on your current medications as directed. Please refer to the Current Medication list given to you today.  *If you need a refill on your cardiac medications before your next appointment, please call your pharmacy*  Lab Work:  NONE ORDERED  TODAY   If you have labs (blood work) drawn today and your tests are completely normal, you will receive your results only by: MyChart Message (if you have MyChart) OR A paper copy in the mail If you have any lab test that is abnormal or we need to change your treatment, we will call you to review the results.  Testing/Procedures: NONE ORDERED  TODAY    Follow-Up: At [facility], you and your health needs are our priority.  As part of our continuing mission to provide you with exceptional heart care, our providers are all part of one team.  This team includes your primary Cardiologist (physician) and Advanced Practice Providers or APPs (Physician Assistants and Nurse Practitioners) who all work together to provide you with the care you need, when you need it.  Your next appointment:   NEXT AVAILABLE  WITH  CHRISTOPHER  OR  CAITLIN    AND  1 year(s) Provider:   You may see OLE ONEIDA HOLTS, MD  or   Charlies Arthur, PA-C   We recommend signing up for the patient portal called MyChart.  Sign up information is provided on this After Visit Summary.  MyChart is used to connect with patients for Virtual Visits (Telemedicine).  Patients are able to view lab/test results, encounter notes, upcoming appointments, etc.  Non-urgent messages can be sent to your provider as well.   To learn more about what you can do with MyChart, go to [email or web address].   Other Instructions

## 2024-02-15 ENCOUNTER — Ambulatory Visit: Payer: Self-pay | Admitting: Cardiology

## 2024-02-20 ENCOUNTER — Other Ambulatory Visit: Payer: Self-pay | Admitting: Internal Medicine

## 2024-02-20 DIAGNOSIS — I10 Essential (primary) hypertension: Secondary | ICD-10-CM

## 2024-02-28 ENCOUNTER — Ambulatory Visit: Payer: Medicare Other

## 2024-02-28 DIAGNOSIS — I428 Other cardiomyopathies: Secondary | ICD-10-CM | POA: Diagnosis not present

## 2024-02-28 LAB — CUP PACEART REMOTE DEVICE CHECK
Battery Remaining Longevity: 12 mo
Battery Remaining Percentage: 17 %
Brady Statistic RA Percent Paced: 0 %
Brady Statistic RV Percent Paced: 100 %
Date Time Interrogation Session: 20251009000200
HighPow Impedance: 73 Ohm
Implantable Lead Connection Status: 753985
Implantable Lead Connection Status: 753985
Implantable Lead Connection Status: 753985
Implantable Lead Implant Date: 20180423
Implantable Lead Implant Date: 20180423
Implantable Lead Implant Date: 20180423
Implantable Lead Location: 753858
Implantable Lead Location: 753859
Implantable Lead Location: 753860
Implantable Lead Model: 292
Implantable Lead Model: 4672
Implantable Lead Model: 7740
Implantable Lead Serial Number: 431793
Implantable Lead Serial Number: 602825
Implantable Lead Serial Number: 703305
Implantable Pulse Generator Implant Date: 20180423
Lead Channel Impedance Value: 369 Ohm
Lead Channel Impedance Value: 457 Ohm
Lead Channel Impedance Value: 988 Ohm
Lead Channel Pacing Threshold Amplitude: 0.5 V
Lead Channel Pacing Threshold Amplitude: 0.6 V
Lead Channel Pacing Threshold Pulse Width: 0.4 ms
Lead Channel Pacing Threshold Pulse Width: 0.4 ms
Lead Channel Setting Pacing Amplitude: 1.6 V
Lead Channel Setting Pacing Amplitude: 2 V
Lead Channel Setting Pacing Pulse Width: 0.4 ms
Lead Channel Setting Pacing Pulse Width: 0.4 ms
Lead Channel Setting Sensing Sensitivity: 0.6 mV
Lead Channel Setting Sensing Sensitivity: 1 mV
Pulse Gen Serial Number: 174046
Zone Setting Status: 755011

## 2024-02-29 NOTE — Progress Notes (Signed)
 Remote ICD Transmission

## 2024-03-03 ENCOUNTER — Ambulatory Visit: Payer: Self-pay | Admitting: Cardiology

## 2024-03-04 NOTE — Progress Notes (Signed)
 Remote ICD Transmission

## 2024-03-13 ENCOUNTER — Ambulatory Visit: Attending: Cardiology | Admitting: *Deleted

## 2024-03-13 DIAGNOSIS — I482 Chronic atrial fibrillation, unspecified: Secondary | ICD-10-CM | POA: Diagnosis present

## 2024-03-13 DIAGNOSIS — Z7901 Long term (current) use of anticoagulants: Secondary | ICD-10-CM | POA: Insufficient documentation

## 2024-03-13 DIAGNOSIS — Z5181 Encounter for therapeutic drug level monitoring: Secondary | ICD-10-CM | POA: Diagnosis present

## 2024-03-13 LAB — POCT INR: POC INR: 2.8

## 2024-03-13 NOTE — Patient Instructions (Addendum)
 Description   INR 2.8; Continue Warfarin 1 tablet daily except 1/2 tablet on Sundays and Thursdays.  Recheck INR in 6 weeks.  Coumadin  Clinic [phone]

## 2024-03-13 NOTE — Progress Notes (Signed)
 Lab Results  Component Value Date   INR 2.8 03/13/2024   INR 2.9 02/07/2024   INR 3.4 (A) 01/10/2024    Description   INR 2.8; Continue Warfarin 1 tablet daily except 1/2 tablet on Sundays and Thursdays.  Recheck INR in 6 weeks.  Coumadin  Clinic [phone]

## 2024-03-18 ENCOUNTER — Ambulatory Visit (HOSPITAL_COMMUNITY)
Admission: RE | Admit: 2024-03-18 | Discharge: 2024-03-18 | Disposition: A | Discharge status: Home / Self Care | Source: Ambulatory Visit | Attending: Internal Medicine | Admitting: Internal Medicine

## 2024-03-18 DIAGNOSIS — C7931 Secondary malignant neoplasm of brain: Secondary | ICD-10-CM | POA: Diagnosis present

## 2024-03-18 MED ORDER — GADOBUTROL 1 MMOL/ML IV SOLN
7.5000 mL | Freq: Once | INTRAVENOUS | Status: AC | PRN
  Administered 2024-03-18: 7.5 mL via INTRAVENOUS

## 2024-03-18 NOTE — Progress Notes (Signed)
 Patient was monitored by this RN during MRI scan due to presence of a pacemaker. Cardiac rhythm was continuously monitored throughout the procedure. Prior to the start of the scan, the pacemaker was placed in MRI-safe mode by the MRI technician and/or pacemaker representative. Following the completion of the scan, the device was returned to its pre-MRI settings. Patient was programmed to VOO 75 bpm. Neurological status and orientation post-procedure were unchanged from baseline.   Pre-procedure Heart Rate (Prior to being placed in MRI safe mode):70 Post-procedure Heart Rate (Once pacemaker is returned to baseline mode): 70

## 2024-03-24 ENCOUNTER — Inpatient Hospital Stay

## 2024-03-25 ENCOUNTER — Ambulatory Visit: Attending: Internal Medicine | Admitting: Internal Medicine

## 2024-03-25 ENCOUNTER — Encounter: Payer: Self-pay | Admitting: Internal Medicine

## 2024-03-25 VITALS — BP 110/72 | HR 71 | Temp 97.6°F | Ht 68.0 in | Wt 187.0 lb

## 2024-03-25 DIAGNOSIS — D509 Iron deficiency anemia, unspecified: Secondary | ICD-10-CM | POA: Insufficient documentation

## 2024-03-25 DIAGNOSIS — I130 Hypertensive heart and chronic kidney disease with heart failure and stage 1 through stage 4 chronic kidney disease, or unspecified chronic kidney disease: Secondary | ICD-10-CM | POA: Diagnosis not present

## 2024-03-25 DIAGNOSIS — M810 Age-related osteoporosis without current pathological fracture: Secondary | ICD-10-CM | POA: Insufficient documentation

## 2024-03-25 DIAGNOSIS — N1832 Chronic kidney disease, stage 3b: Secondary | ICD-10-CM | POA: Diagnosis not present

## 2024-03-25 DIAGNOSIS — Z7901 Long term (current) use of anticoagulants: Secondary | ICD-10-CM | POA: Diagnosis not present

## 2024-03-25 DIAGNOSIS — Z923 Personal history of irradiation: Secondary | ICD-10-CM | POA: Diagnosis not present

## 2024-03-25 DIAGNOSIS — Z95810 Presence of automatic (implantable) cardiac defibrillator: Secondary | ICD-10-CM | POA: Insufficient documentation

## 2024-03-25 DIAGNOSIS — Z85528 Personal history of other malignant neoplasm of kidney: Secondary | ICD-10-CM | POA: Insufficient documentation

## 2024-03-25 DIAGNOSIS — I099 Rheumatic heart disease, unspecified: Secondary | ICD-10-CM | POA: Insufficient documentation

## 2024-03-25 DIAGNOSIS — Z79890 Hormone replacement therapy (postmenopausal): Secondary | ICD-10-CM | POA: Diagnosis not present

## 2024-03-25 DIAGNOSIS — Z952 Presence of prosthetic heart valve: Secondary | ICD-10-CM | POA: Insufficient documentation

## 2024-03-25 DIAGNOSIS — I428 Other cardiomyopathies: Secondary | ICD-10-CM | POA: Insufficient documentation

## 2024-03-25 DIAGNOSIS — Z79899 Other long term (current) drug therapy: Secondary | ICD-10-CM | POA: Insufficient documentation

## 2024-03-25 DIAGNOSIS — I2510 Atherosclerotic heart disease of native coronary artery without angina pectoris: Secondary | ICD-10-CM | POA: Diagnosis not present

## 2024-03-25 DIAGNOSIS — I10 Essential (primary) hypertension: Secondary | ICD-10-CM | POA: Diagnosis present

## 2024-03-25 DIAGNOSIS — G40909 Epilepsy, unspecified, not intractable, without status epilepticus: Secondary | ICD-10-CM | POA: Insufficient documentation

## 2024-03-25 DIAGNOSIS — R238 Other skin changes: Secondary | ICD-10-CM

## 2024-03-25 DIAGNOSIS — M109 Gout, unspecified: Secondary | ICD-10-CM | POA: Diagnosis not present

## 2024-03-25 DIAGNOSIS — I482 Chronic atrial fibrillation, unspecified: Secondary | ICD-10-CM | POA: Diagnosis not present

## 2024-03-25 DIAGNOSIS — I5032 Chronic diastolic (congestive) heart failure: Secondary | ICD-10-CM | POA: Diagnosis not present

## 2024-03-25 DIAGNOSIS — E890 Postprocedural hypothyroidism: Secondary | ICD-10-CM | POA: Insufficient documentation

## 2024-03-25 NOTE — Progress Notes (Signed)
 Patient ID: REN GRASSE, female    DOB: 03-15-1946  MRN: [id number]  CC: Hypertension (HTN f/u. Thompson test - unable to remember what test it was or what it was for/Already received flu vax)   Subjective: Jacqueline Palmer is a 78 y.o. female who presents for chronic ds management. Her concerns today include:  Patient with history of CAD with history CABGx4, chronic diastolic CHF/NICM (recovered LVEF) with BV-ICD, HTN, rheumatic heart disease status post MVR replacement, permanent A. 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  History of Present Illness Jacqueline EVON is a 78 year old female who presents for a four-month follow-up.  She resides in an assisted living facility and has been managing well despite recent staffing changes. No major issues have arisen since her last visit in July.  She has osteoporosis, diagnosed following a bone density study in July, and is currently taking calcium 600 mg twice daily and vitamin D 800 international units daily. An appointment with an endocrinologist is scheduled for December 28th.  CKD 3b: She has chronic kidney disease stage 3, with stable kidney function since her last blood test in July with GFR in 30-40's. Her last nephrology visit was in May.  Sz ds: She has a history of seizures and is taking Keppra  twice daily. No seizures in over a yr. Recently started driving again including a recent trip to [place], [place]  recently.  Hypothyroid: Nl TSH four months ago. I was notification from her pharmacy 2 mths ago that the manufacturer of her levothyroxine  was changed.  She has  a history of renal cell carcinoma, with a repeat CT scan of the chest, abdomen, and pelvis in July showing no signs of recurrence.  CAD/CHF/atrial fibrillation/HTN. No chest pain, shortness of breath, or leg swelling is reported. She is on Coumadin  for atrial fibrillation, with no issues of bruising or bleeding, and her INR is checked every four to five weeks through cardiology anticoag clinic. She is taking Torsemide , spironolactone  25 mg daily, metoprolol  25 mg daily, pravastatin  40 mg, and hydralazine  10 mg three times a day.  She has noticed some white spots on her face, particularly on the left cheek, over the past couple of months.  Community dermatologist comes to her assisted living facility every 3 to 4 months to do on-site visits. She plans to have this eval the next time she comes.  Her hemoglobin level dropped slightly to 11.9 in July.  Previous levels were range from 12.2-13.3.  MCV has been in the 70s.  She is up to date with vaccinations, including flu, pneumonia, RSV, and COVID.    Patient Active Problem List   Diagnosis Date Noted  . Goals of care, counseling/discussion 10/12/2022  . Coronary artery disease of autologous vein bypass graft with stable angina pectoris 08/14/2022  . Advance directive in chart 04/24/2021  . Ascending aortic aneurysm 04/24/2021  . Gait disturbance 04/19/2021  . Acute-on-chronic kidney injury 12/03/2020  . Subtherapeutic international normalized ratio (INR) 12/03/2020  . Chest pain 12/02/2020  . Neuropathic pain 11/25/2020  . Seizures (HCC) 06/16/2020  . Over weight 05/03/2020  . Nonischemic cardiomyopathy (HCC) 03/04/2020  . Biventricular automatic  implantable cardioverter defibrillator in situ 02/19/2020  . Chronic diastolic heart failure (HCC) 02/19/2020  . Hx of CABG 02/19/2020  . Coronary artery disease involving native coronary artery of native heart without angina pectoris 02/19/2020  . Essential hypertension 02/19/2020  . Permanent  atrial fibrillation (HCC) 02/19/2020  . CKD (chronic kidney disease) stage 4, GFR 15-29 ml/min (HCC) 02/19/2020  . Renal cell carcinoma of right kidney (HCC) 02/12/2020  . Malignant neoplasm metastatic to brain (HCC) 02/12/2020  . Gout attack 08/14/2016  . Chronic a-fib (HCC) 08/14/2016  . Chronic anticoagulation 08/14/2016  . 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     Current Outpatient Medications on File Prior to Visit  Medication Sig Dispense Refill  . acetaminophen  (TYLENOL ) 500 MG tablet Take 1,000 mg by mouth daily with breakfast.    . colchicine  0.6 MG tablet Take 1 tablet (0.6 mg total) by mouth at bedtime. 90 tablet 3  . COVID-19 mRNA vaccine, Pfizer, (COMIRNATY ) syringe Inject into the muscle. 0.5 mL 0  . hydrALAZINE  (APRESOLINE ) 10 MG tablet Take 1 tablet (10 mg total) by mouth 3 (three) times daily. 270 tablet 4  . levETIRAcetam  (KEPPRA ) 500 MG tablet Take 1 tablet (500 mg total) by mouth 2 (two) times daily. 180 tablet 3  . levothyroxine  (SYNTHROID ) 25 MCG tablet TAKE 1 TABLET BY MOUTH ONCE DAILY IN THE MORNING 30 TO 60 MINUTES BEFORE BREAKFAST ON AN EMPTY STOMACH WITH A FULL GLASS OF WATER  90 tablet 4  . methocarbamol  (ROBAXIN ) 500 MG tablet Take 2 tablets (1,000 mg total) by mouth every 8 (eight) hours as needed. For muscle spasm 90 tablet 0  . metoprolol  succinate (TOPROL -XL) 25 MG 24 hr tablet Take 1 tablet (25 mg total) by mouth daily. 90 tablet 4  . nitroGLYCERIN  (NITROSTAT ) 0.4 MG SL tablet Place 1 tablet (0.4 mg total) under the tongue every 5 (five) minutes as needed for chest pain. 25 tablet 3  . ondansetron  (ZOFRAN -ODT) 4 MG disintegrating tablet Take 1 tablet (4 mg total) by mouth every 8 (eight) hours as needed for nausea or vomiting. 20 tablet 0  . polyethylene glycol (MIRALAX  / GLYCOLAX ) 17 g packet Take 17 g by mouth daily as needed for mild constipation.    . pravastatin  (PRAVACHOL ) 40 MG tablet TAKE ONE TABLET BY MOUTH ONCE A DAY 90 tablet 2  . spironolactone   (ALDACTONE ) 25 MG tablet TAKE ONE TABLET BY MOUTH ONCE A DAY 90 tablet 0  . torsemide  (DEMADEX ) 20 MG tablet TAKE TWO TABLETS BY MOUTH IN THE MORNING AND ONE AT NIGHT 270 tablet 0  . warfarin (COUMADIN ) 5 MG tablet TAKE ONE TABLET BY MOUTH ONCE A DAY IN THE EVENING OR AS DIRECTED BY THE COUMADIN  CLINIC 100 tablet 0   No current facility-administered medications on file prior to visit.    Allergies  Allergen Reactions  . Avocado Shortness Of Breath    Before 2007  . Banana Anaphylaxis and Swelling  . Plantain Anaphylaxis and Swelling  . Ace Inhibitors Cough  . Codeine Other (See Comments)    Nausea and Feels Jittery  . Prednisone  Swelling    Of face and lower extremities  . Tape Hives    Tape  Does better with paper tape   . Latex Rash  . Pharm[employer] Cosmetic [[place]] Swelling and Rash    Some Soap, perfumes,detergent    Social History   Socioeconomic History  . Marital status: Widowed    Spouse name: Not on file  . Number of children: 0  .  Years of education: Best Boy  . Highest education level: Doctorate  Occupational History  . Occupation: Retired Orthoptist  Tobacco Use  . Smoking status: Former    Current packs/day: 0.00    Types: Cigarettes    Quit date: 07/30/2014    Years since quitting: 9.6  . Smokeless tobacco: Never  Vaping Use  . Vaping status: Never Used  Substance and Sexual Activity  . Alcohol use: Yes    Comment: 1 glass wine twice weekly  . Drug use: No  . Sexual activity: Not on file  Other Topics Concern  . Not on file  Social History Narrative   Right handed   Coffee daily/tea qod   Social Drivers of Health   Financial Resource Strain: Low Risk  (03/24/2024)   Overall Financial Resource Strain (CARDIA)   . Difficulty of Paying Living Expenses: Not hard at all  Food Insecurity: No Food Insecurity (03/24/2024)   Hunger Vital Sign   . Worried About Programme Researcher, Broadcasting/film/video in the Last Year: Never true   . Ran Out of Food in the Last Year: Never  true  Transportation Needs: No Transportation Needs (03/24/2024)   PRAPARE - Transportation   . Lack of Transportation (Medical): No   . Lack of Transportation (Non-Medical): No  Physical Activity: Insufficiently Active (03/24/2024)   Exercise Vital Sign   . Days of Exercise per Week: 2 days   . Minutes of Exercise per Session: 20 min  Stress: No Stress Concern Present (03/24/2024)   [employer] of Occupational Health - Occupational Stress Questionnaire   . Feeling of Stress: Not at all  Social Connections: Moderately Integrated (03/24/2024)   Social Connection and Isolation Panel   . Frequency of Communication with Friends and Family: More than three times a week   . Frequency of Social Gatherings with Friends and Family: More than three times a week   . Attends Religious Services: More than 4 times per year   . Active Member of Clubs or Organizations: Yes   . Attends Banker Meetings: More than 4 times per year   . Marital Status: Widowed  Intimate Partner Violence: Not At Risk (04/24/2023)   Humiliation, Afraid, Rape, and Kick questionnaire   . Fear of Current or Ex-Partner: No   . Emotionally Abused: No   . Physically Abused: No   . Sexually Abused: No    Family History  Problem Relation Age of Onset  . Heart attack Mother   . Heart attack Father   . Heart attack Sister   . Breast cancer Neg Hx     Past Surgical History:  Procedure Laterality Date  . ABDOMINAL HYSTERECTOMY    . CORONARY ARTERY BYPASS GRAFT    . CRANIOTOMY Right 07/30/2019   Right frontal  . NEPHRECTOMY Right 10/16/2019  . TOTAL HIP ARTHROPLASTY Right 07/11/2021   Procedure: RIGHT TOTAL HIP ARTHROPLASTY ANTERIOR APPROACH;  Surgeon: Liam Lerner, MD;  Location: [facility];  Service: Orthopedics;  Laterality: Right;    ROS: Review of Systems Negative except as stated above  PHYSICAL EXAM: BP 110/72 (BP Location: Left Arm, Patient Position: Sitting, Cuff Size: Normal)   Pulse 71   Temp  97.6 F (36.4 C) (Oral)   Ht 5' 8 (1.727 m)   Wt 187 lb (84.8 kg)   SpO2 100%   BMI 28.43 kg/m   Wt Readings from Last 3 Encounters:  03/25/24 187 lb (84.8 kg)  02/14/24 184 lb (83.5 kg)  12/04/23 189  lb (85.7 kg)    Physical Exam  General appearance - alert, well appearing, elderly AAFand in no distress. Ambulates with a cane. Mental status - normal mood, behavior, speech, dress, motor activity, and thought processes Neck - supple, no significant adenopathy Chest - clear to auscultation, no wheezes, rales or rhonchi, symmetric air entry Heart - sounds to be in sinus rhythm - RRR, no murmurs Extremities - no LE edema Skin: Several hypopigmented spots noted on both upper cheeks left greater than right     Latest Ref Rng & Units 11/27/2023    9:59 AM 07/31/2023    4:42 PM 07/31/2023   10:33 AM  CMP  Glucose 70 - 99 mg/dL 89  81  97   BUN 8 - 23 mg/dL 38  32  35   Creatinine 0.44 - 1.00 mg/dL 7.76  7.37  7.04   Sodium 135 - 145 mmol/L 135  135  135   Potassium 3.5 - 5.1 mmol/L 5.0  4.2  4.1   Chloride 98 - 111 mmol/L 105  102  102   CO2 22 - 32 mmol/L 23  21  23    Calcium 8.9 - 10.3 mg/dL 9.7  9.4  9.6   Total Protein 6.5 - 8.1 g/dL 7.5  7.4  7.9   Total Bilirubin 0.0 - 1.2 mg/dL 0.7  1.0  1.0   Alkaline Phos 38 - 126 U/L 84  75  84   AST 15 - 41 U/L 23  22  24    ALT 0 - 44 U/L 20  16  18     Lipid Panel     Component Value Date/Time   CHOL 181 03/28/2022 1157   TRIG 118 03/28/2022 1157   HDL 64 03/28/2022 1157   CHOLHDL 2.8 03/28/2022 1157   LDLCALC 96 03/28/2022 1157    CBC    Component Value Date/Time   WBC 5.5 11/27/2023 0959   WBC 5.8 07/31/2023 1527   RBC 4.84 11/27/2023 0959   HGB 11.9 (L) 11/27/2023 0959   HGB 12.4 08/18/2021 1219   HCT 35.2 (L) 11/27/2023 0959   HCT 39.5 08/18/2021 1219   PLT 223 11/27/2023 0959   PLT 292 08/18/2021 1219   MCV 72.7 (L) 11/27/2023 0959   MCV 82 08/18/2021 1219   MCH 24.6 (L) 11/27/2023 0959   MCHC 33.8 11/27/2023  0959   RDW 14.6 11/27/2023 0959   RDW 16.0 (H) 08/18/2021 1219   LYMPHSABS 1.5 11/27/2023 0959   MONOABS 0.8 11/27/2023 0959   EOSABS 0.2 11/27/2023 0959   BASOSABS 0.1 11/27/2023 0959    ASSESSMENT AND PLAN: 1. Age-related osteoporosis without current pathological fracture (Primary) - Continue calcium 600 mg twice daily and vitamin D supplementation. - Attend endocrinology appointment on December 28th.  2. CKD (chronic kidney disease) stage 3b (HCC) Stable. Continue to monitor  3. Postablative hypothyroidism Continue Levothyroxine  25 mcg daily - TSH  4. Seizure disorder (HCC) Stable and seizure-free on Keppra .  Advised patient that [place]  requires that patient be free of seizure for 6 to 12 months before driving.  She has far exceeded that length of time.  5. Chronic atrial fibrillation (HCC) In sinus rhythm based on auscultation.  Continue Coumadin  and metoprolol .  6. Essential hypertension At goal. Continue spironolactone  25 mg daily, metoprolol  25 mg daily, torsemide  20 mg daily, hydralazine  10 mg 3 times a day   7. Coronary artery disease involving native coronary artery of native heart without angina pectoris Stable.  Continue metoprolol  25 mg daily and Pravachol  40 mg daily   8. Microcytic anemia - CBC - Iron, TIBC and Ferritin Panel  9. Other skin changes Patient plans to have this evaluated by the dermatologist who comes to her assisted living facility.  I told her that this is okay with me.    Patient was given the opportunity to ask questions.  Patient verbalized understanding of the plan and was able to repeat key elements of the plan.   This documentation was completed using Paediatric nurse.  Any transcriptional errors are unintentional.  Orders Placed This Encounter  Procedures  . CBC  . Iron, TIBC and Ferritin Panel  . TSH     Requested Prescriptions    No prescriptions requested or ordered in this encounter     Return in about 4 months (around 07/23/2024).  Barnie Louder, MD, FACP

## 2024-03-25 NOTE — Patient Instructions (Signed)
  VISIT SUMMARY: You had a follow-up appointment today to review your ongoing health conditions. Overall, you are managing well despite recent changes at your assisted living facility. We discussed your chronic kidney disease, heart conditions, seizure disorder, anemia, hypothyroidism, osteoporosis, and history of renal cell carcinoma. Your vaccinations are up to date, and you have several follow-up appointments and lab tests scheduled.  YOUR PLAN: -CHRONIC KIDNEY DISEASE STAGE 4: Chronic kidney disease stage 4 means your kidneys are significantly damaged and not working as well as they should. Your kidney function has remained stable, and you should schedule a follow-up with nephrology if you haven't already.  -CONGESTIVE HEART FAILURE: Congestive heart failure means your heart is not pumping blood as well as it should. Your symptoms are stable, and your blood pressure is controlled. Continue taking spironolactone , metoprolol , hydralazine , and pravastatin  as prescribed. Refill your spironolactone  prescription if needed.  -ATRIAL FIBRILLATION: Atrial fibrillation is an irregular and often rapid heart rate. Your condition is managed with Coumadin , and your INR levels are stable. Continue taking Coumadin  and have your INR monitored regularly.  -SEIZURE DISORDER: A seizure disorder means you have episodes of abnormal electrical activity in your brain. Your seizures are well-controlled with Keppra , and you haven't had any recent seizures. Continue taking Keppra  as prescribed.  -ANEMIA: Anemia means you have a lower than normal number of red blood cells. Your hemoglobin level dropped slightly, so we need to recheck your hemoglobin and iron levels.  -HYPOTHYROIDISM: Hypothyroidism means your thyroid gland is underactive and not producing enough hormones. Your condition is managed with levothyroxine , but a recent manufacturer change may affect your levels. We will check your thyroid levels at the lab  today.  -OSTEOPOROSIS: Osteoporosis means your bones are weak and more likely to break. You are taking calcium and vitamin D to help manage this condition. Continue taking calcium 600 mg twice daily and vitamin D. Attend your endocrinology appointment on December 28th.  -RENAL CELL CARCINOMA: Renal cell carcinoma is a type of kidney cancer. Your recent CT scan showed no signs of recurrence.  -GENERAL HEALTH MAINTENANCE: You are up to date on all recommended vaccinations, including flu, pneumonia, RSV, and COVID.  INSTRUCTIONS: Please stop by the lab today to check your thyroid and hemoglobin/iron levels. Also, call Medicare to address a potential billing error related to urinary catheter supplies.                      Contains text generated by [employer]bridge.                                 Contains text generated by Abridge.

## 2024-03-26 ENCOUNTER — Ambulatory Visit: Payer: Self-pay | Admitting: Internal Medicine

## 2024-03-26 LAB — CBC
Hematocrit: 38.6 % (ref 34.0–46.6)
Hemoglobin: 11.9 g/dL (ref 11.1–15.9)
MCH: 24.7 pg — ABNORMAL LOW (ref 26.6–33.0)
MCHC: 30.8 g/dL — ABNORMAL LOW (ref 31.5–35.7)
MCV: 80 fL (ref 79–97)
Platelets: 231 x10E3/uL (ref 150–450)
RBC: 4.81 x10E6/uL (ref 3.77–5.28)
RDW: 15.4 % (ref 11.7–15.4)
WBC: 6.1 x10E3/uL (ref 3.4–10.8)

## 2024-03-26 LAB — IRON,TIBC AND FERRITIN PANEL
Ferritin: 1180 ng/mL — ABNORMAL HIGH (ref 15–150)
Iron Saturation: 39 % (ref 15–55)
Iron: 113 ug/dL (ref 27–139)
Total Iron Binding Capacity: 288 ug/dL (ref 250–450)
UIBC: 175 ug/dL (ref 118–369)

## 2024-03-26 LAB — TSH: TSH: 2.98 u[IU]/mL (ref 0.450–4.500)

## 2024-03-27 ENCOUNTER — Inpatient Hospital Stay

## 2024-03-27 ENCOUNTER — Inpatient Hospital Stay: Attending: Internal Medicine | Admitting: Internal Medicine

## 2024-03-27 ENCOUNTER — Other Ambulatory Visit: Payer: Self-pay | Admitting: *Deleted

## 2024-03-27 VITALS — BP 113/77 | HR 70 | Temp 97.8°F | Resp 16 | Ht 68.0 in | Wt 186.0 lb

## 2024-03-27 DIAGNOSIS — C7931 Secondary malignant neoplasm of brain: Secondary | ICD-10-CM | POA: Diagnosis not present

## 2024-03-27 DIAGNOSIS — C641 Malignant neoplasm of right kidney, except renal pelvis: Secondary | ICD-10-CM | POA: Insufficient documentation

## 2024-03-27 DIAGNOSIS — C649 Malignant neoplasm of unspecified kidney, except renal pelvis: Secondary | ICD-10-CM | POA: Diagnosis not present

## 2024-03-27 DIAGNOSIS — Z905 Acquired absence of kidney: Secondary | ICD-10-CM | POA: Insufficient documentation

## 2024-03-27 DIAGNOSIS — Z87891 Personal history of nicotine dependence: Secondary | ICD-10-CM | POA: Diagnosis not present

## 2024-03-27 DIAGNOSIS — Z923 Personal history of irradiation: Secondary | ICD-10-CM | POA: Diagnosis not present

## 2024-03-27 NOTE — Progress Notes (Signed)
 [facility] at [facility] [address]  [place], [place] [address] [phone]   Interval Evaluation  Date of Service: 03/27/24 Patient Name: Jacqueline Palmer Patient MRN: [id number] Patient DOB: 20-Nov-1945 Provider: Arthea MARLA Manns, MD  Identifying Statement:  Jacqueline Palmer is a 78 y.o. female with Malignant neoplasm metastatic to brain [facility]) [C79.31]   Primary Cancer: Renal Cell Carcinoma, Stage IV  Oncologic History: 07/30/19: Right frontal craniotomy, resection of solitary brain metastasis at [facility] 10/06/19: Post-operative SRS at [facility] (Dr. Emil) 03/09/20: Salvage SRS to R frontal+LMD 27/3x 02/08/21: Salvage SRS to L frontal Jacqueline Palmer)  Interval History:  Jacqueline Palmer presents today for follow up after recent MRI brain.  She denies any new or progressive neurologic symptoms today. No recent seizures, continues to take Keppra  500mg  twice per day.  Continues to be active mentally and engaging socially.  H+P (02/24/20) Patient presents to review recent changes on brain MRI.  She initially presented in March 2021 with a seizure, described as left side clenching up, lost awareness for a few minutes.  CNS imaging demonstrated an enhancing right frontal mass, c/w likely metastasis from renal cell carcinoma.  Craniotomy was performed at [facility] and followed with surgical bed radiosurgery at [facility].  She did have one recurrence of seizure activity in late August, at which time she was restarted on Keppra .  Unclear on why this was discontinued.  Recent MRI demonstrated some change and she presents today for treatment recommendations.  No neurologic complaints aside from seizures.  Medications: Current Outpatient Medications on File Prior to Visit  Medication Sig Dispense Refill   acetaminophen  (TYLENOL ) 500 MG tablet Take 1,000 mg by mouth daily with breakfast.     colchicine  0.6 MG tablet Take 1 tablet (0.6 mg total) by mouth at bedtime. 90 tablet 3   COVID-19  mRNA vaccine, Pfizer, (COMIRNATY ) syringe Inject into the muscle. 0.5 mL 0   hydrALAZINE  (APRESOLINE ) 10 MG tablet Take 1 tablet (10 mg total) by mouth 3 (three) times daily. 270 tablet 4   levETIRAcetam  (KEPPRA ) 500 MG tablet Take 1 tablet (500 mg total) by mouth 2 (two) times daily. 180 tablet 3   levothyroxine  (SYNTHROID ) 25 MCG tablet TAKE 1 TABLET BY MOUTH ONCE DAILY IN THE MORNING 30 TO 60 MINUTES BEFORE BREAKFAST ON AN EMPTY STOMACH WITH A FULL GLASS OF WATER  90 tablet 4   methocarbamol  (ROBAXIN ) 500 MG tablet Take 2 tablets (1,000 mg total) by mouth every 8 (eight) hours as needed. For muscle spasm 90 tablet 0   metoprolol  succinate (TOPROL -XL) 25 MG 24 hr tablet Take 1 tablet (25 mg total) by mouth daily. 90 tablet 4   nitroGLYCERIN  (NITROSTAT ) 0.4 MG SL tablet Place 1 tablet (0.4 mg total) under the tongue every 5 (five) minutes as needed for chest pain. 25 tablet 3   ondansetron  (ZOFRAN -ODT) 4 MG disintegrating tablet Take 1 tablet (4 mg total) by mouth every 8 (eight) hours as needed for nausea or vomiting. 20 tablet 0   polyethylene glycol (MIRALAX  / GLYCOLAX ) 17 g packet Take 17 g by mouth daily as needed for mild constipation.     pravastatin  (PRAVACHOL ) 40 MG tablet TAKE ONE TABLET BY MOUTH ONCE A DAY 90 tablet 2   spironolactone  (ALDACTONE ) 25 MG tablet TAKE ONE TABLET BY MOUTH ONCE A DAY 90 tablet 0   torsemide  (DEMADEX ) 20 MG tablet TAKE TWO TABLETS BY MOUTH IN THE MORNING AND ONE AT NIGHT 270 tablet 0  warfarin (COUMADIN ) 5 MG tablet TAKE ONE TABLET BY MOUTH ONCE A DAY IN THE EVENING OR AS DIRECTED BY THE COUMADIN  CLINIC 100 tablet 0   No current facility-administered medications on file prior to visit.    Allergies:  Allergies  Allergen Reactions   Avocado Shortness Of Breath    Before 2007   Banana Anaphylaxis and Swelling   Plantain Anaphylaxis and Swelling   Ace Inhibitors Cough   Codeine Other (See Comments)    Nausea and Feels Jittery   Prednisone  Swelling    Of  face and lower extremities   Tape Hives    Tape  Does better with paper tape    Latex Rash   Pharmabase Cosmetic [Aquamed] Swelling and Rash    Some Soap, perfumes,detergent   Past Medical History:  Past Medical History:  Diagnosis Date   Acute kidney injury    Arthritis    Atrial fibrillation (HCC)    Cancer (HCC)    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   Cardiomyopathy (HCC)    CHF (congestive heart failure) (HCC)    Chronic a-fib (HCC) 08/14/2016   Chronic anticoagulation 08/14/2016   Coronary artery disease    4v CABG 6/17   Graves disease    Hypertension    Hypothyroidism    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   SN [id number] Model [id number] [From patient's surgical info card]   Presence of permanent cardiac pacemaker    Seizures (HCC)    Thrombus 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 myocardial infarction    Thyroid disease    Past Surgical History:  Past Surgical History:  Procedure Laterality Date   ABDOMINAL HYSTERECTOMY     CORONARY ARTERY BYPASS GRAFT     CRANIOTOMY Right 07/30/2019   Right frontal   NEPHRECTOMY Right 10/16/2019   TOTAL HIP ARTHROPLASTY Right 07/11/2021   Procedure: RIGHT TOTAL HIP ARTHROPLASTY ANTERIOR APPROACH;  Surgeon: Liam Lerner, MD;  Location: [facility];  Service: Orthopedics;  Laterality: Right;   Social History:  Social History   Socioeconomic History   Marital status: Widowed    Spouse name: Not on file   Number of children: 0   Years of education: Doctorate   Highest education level: Doctorate  Occupational History   Occupation: Retired Orthoptist  Tobacco Use   Smoking status: Former    Current packs/day: 0.00    Types: Cigarettes    Quit date: 07/30/2014    Years since quitting: 9.6   Smokeless tobacco: Never  Vaping Use   Vaping status: Never Used  Substance and Sexual Activity   Alcohol use: Yes    Comment: 1 glass wine twice weekly   Drug use: No   Sexual activity: Not on file  Other Topics  Concern   Not on file  Social History Narrative   Right handed   Coffee daily/tea qod   Social Drivers of Health   Financial Resource Strain: Low Risk  (03/24/2024)   Overall Financial Resource Strain (CARDIA)    Difficulty of Paying Living Expenses: Not hard at all  Food Insecurity: No Food Insecurity (03/24/2024)   Hunger Vital Sign    Worried About Running Out of Food in the Last Year: Never true    Ran Out of Food in the Last Year: Never true  Transportation Needs: No Transportation Needs (03/24/2024)   PRAPARE - Administrator, Civil Service (Medical): No    Lack of Transportation (Non-Medical): No  Physical  Activity: Insufficiently Active (03/24/2024)   Exercise Vital Sign    Days of Exercise per Week: 2 days    Minutes of Exercise per Session: 20 min  Stress: No Stress Concern Present (03/24/2024)   [employer] of Occupational Health - Occupational Stress Questionnaire    Feeling of Stress: Not at all  Social Connections: Moderately Integrated (03/24/2024)   Social Connection and Isolation Panel    Frequency of Communication with Friends and Family: More than three times a week    Frequency of Social Gatherings with Friends and Family: More than three times a week    Attends Religious Services: More than 4 times per year    Active Member of [employer] or Organizations: Yes    Attends Banker Meetings: More than 4 times per year    Marital Status: Widowed  Intimate Partner Violence: Not At Risk (04/24/2023)   Humiliation, Afraid, Rape, and Kick questionnaire    Fear of Current or Ex-Partner: No    Emotionally Abused: No    Physically Abused: No    Sexually Abused: No   Family History:  Family History  Problem Relation Age of Onset   Heart attack Mother    Heart attack Father    Heart attack Sister    Breast cancer Neg Hx     Review of Systems: Constitutional: Doesn't report fevers, chills or abnormal weight loss Eyes: Doesn't report  blurriness of vision Ears, nose, mouth, throat, and face: Doesn't report sore throat Respiratory: Doesn't report cough, dyspnea or wheezes Cardiovascular: Doesn't report palpitation, chest discomfort  Gastrointestinal:  Doesn't report nausea, constipation, diarrhea GU: Doesn't report incontinence Skin: Doesn't report skin rashes Neurological: Per HPI Musculoskeletal: Doesn't report joint pain Behavioral/Psych: Doesn't report anxiety  Physical Exam:    03/25/2024    1:39 PM 02/14/2024    9:59 AM 12/04/2023   10:21 AM  Vitals with BMI  Height 5' 8 5' 8 5' 8  Weight 187 lbs 184 lbs 189 lbs  BMI 28.44 27.98 28.74  Systolic 110 112 893  Diastolic 72 68 75  Pulse 71  70     KPS: 80. General: Alert, cooperative, pleasant, in no acute distress Head: Normal EENT: No conjunctival injection or scleral icterus.  Lungs: Resp effort normal Cardiac: Regular rate Abdomen: Non-distended abdomen Skin: No rashes cyanosis or petechiae. Extremities: No clubbing or edema  Neurologic Exam: Mental Status: Awake, alert, attentive to examiner. Oriented to self and environment. Language is fluent with intact comprehension.  Cranial Nerves: Visual acuity is grossly normal. Visual fields are full. Extra-ocular movements intact. No ptosis. Face is symmetric Motor: Tone and bulk are normal. Power is full in both arms and legs. Reflexes are symmetric, no pathologic reflexes present.  Sensory: Intact to light touch Gait: Orthopedic limitations  Labs: I have reviewed the data as listed    Component Value Date/Time   NA 135 11/27/2023 0959   NA 141 12/14/2020 1053   K 5.0 11/27/2023 0959   CL 105 11/27/2023 0959   CO2 23 11/27/2023 0959   GLUCOSE 89 11/27/2023 0959   BUN 38 (H) 11/27/2023 0959   BUN 45 (H) 12/14/2020 1053   CREATININE 2.23 (H) 11/27/2023 0959   CALCIUM 9.7 11/27/2023 0959   PROT 7.5 11/27/2023 0959   PROT 7.7 03/28/2022 1157   ALBUMIN  4.3 11/27/2023 0959   ALBUMIN  4.8  03/28/2022 1157   AST 23 11/27/2023 0959   ALT 20 11/27/2023 0959   ALKPHOS 84 11/27/2023 0959  BILITOT 0.7 11/27/2023 0959   GFRNONAA 22 (L) 11/27/2023 0959   GFRAA 30 (L) 01/20/2020 1433   Lab Results  Component Value Date   WBC 6.1 03/25/2024   NEUTROABS 3.0 11/27/2023   HGB 11.9 03/25/2024   HCT 38.6 03/25/2024   MCV 80 03/25/2024   PLT 231 03/25/2024    Imaging:  [facility] Clinician Interpretation: I have personally reviewed the CNS images as listed.  My interpretation, in the context of the patient's clinical presentation, is stable disease  MR BRAIN W WO CONTRAST Result Date: 03/18/2024 EXAM: MRI BRAIN WITH AND WITHOUT CONTRAST 03/18/2024 02:33:23 PM TECHNIQUE: Multiplanar multisequence MRI of the head/brain was performed with and without the administration of intravenous contrast. COMPARISON: MRI head 09/17/2023. CLINICAL HISTORY: Brain/CNS neoplasm, assess treatment response. Brain/CNS neoplasm, assess treatment response ; 7.68ml Gadavist  Brain/CNS neoplasm, assess treatment response. Brain/CNS neoplasm, assess treatment response ; 7.54ml Gadavist  FINDINGS: BRAIN AND VENTRICLES: No acute infarct. No acute intracranial hemorrhage. No mass effect or midline shift. No hydrocephalus. The sella is unremarkable. Normal flow voids. No new intracranial lesions noted. Similar postsurgical changes of right frontotemporal craniotomy. There is encephalomalacia in the posterior right frontal lobe with areas of irregular cortical enhancement which are similar to prior. There is no new or enlarging enhancement in this region. Similar susceptibility along the resection cavity in this region. Stable appearance of focal enhancement within the cortex of the posterior right frontal lobe seen on series 16 image 159. Focal susceptibility in the anterior left frontal lobe with associated mild enhancement likely reflecting treated metastatic lesion similar to prior seen on series 16 image 149. Similar appearance  of chronic microvascular ischemic changes. Remote lacunar infarcts in the bilateral thalami and in the right corona radiata. Additional small remote infarcts in the posterior right cerebellum again noted. ORBITS: Chronic deformity of the right lamina papyracea. SINUSES: No acute abnormality. BONES AND SOFT TISSUES: Normal bone marrow signal and enhancement. No acute soft tissue abnormality. IMPRESSION: 1. Stable postsurgical changes of right frontotemporal craniotomy with encephalomalacia and irregular cortical enhancement in the posterior right frontal lobe, without new or enlarging enhancement. 2. Stable cortical enhancement in the posterior right frontal lobe. 3. Stable focal enhancement in the anterior left frontal lobe, likely reflecting a treated metastatic lesion. 4. Stable cortical thickening and FLAIR signal abnormality at the right temporoparietal junction. 5. No new intracranial lesions. Electronically signed by: Donnice Mania MD 03/18/2024 04:33 PM EDT RP Workstation: [id number]   CUP PACEART REMOTE DEVICE CHECK Result Date: 02/28/2024 ICD Scheduled remote reviewed. Normal device function.  Presenting rhythm:  AFL-BIV paced, VVIR.  HL-2. Known permanent AF, on warfarin per Epic. Next remote transmission per protocol. - CS, CVRSMRI Protection last programmed on Sep 17, 2023. Beeper is OFF.    Assessment/Plan Malignant neoplasm metastatic to brain [facility]) [C79.31]  Jacqueline Palmer is clinically and radiographically stable today.  No objective clinical changes.  Continues on observation for renal cell carcinoma with Dr. Sherrod.   Recommended continuing Keppra  at 500mg  BID.    We appreciate the opportunity to participate in the care of Jacqueline Palmer.    We ask that Jacqueline Palmer return to clinic in 9 months following next brain MRI, or sooner as needed.  All questions were answered. The patient knows to call the clinic with any problems, questions or concerns. No barriers to learning were  detected.  The total time spent in the encounter was 40 minutes and more than 50% was on counseling and review  of test results   Arthea MARLA Manns, MD Medical Director of Neuro-Oncology [facility] at [facility] 03/27/24 11:00 AM

## 2024-04-01 ENCOUNTER — Telehealth: Payer: Self-pay | Admitting: Internal Medicine

## 2024-04-01 NOTE — Telephone Encounter (Signed)
 Scheduled patient for next appointment. Called and left a voicemail with the details.

## 2024-04-21 ENCOUNTER — Telehealth: Payer: Self-pay

## 2024-04-21 NOTE — Telephone Encounter (Signed)
 Dr Vicci received orders for out patient OT from [employer].  Dr Vicci had only authorized short term OT 02/07/2024.    The new orders  for OT have a certification period 04/11/2024- 06/08/2023.  Those services were not authorized by Dr Vicci.  I called [facility]: [phone] to inquire why the patient still needs OT.  Dr Vicci has not signed off on these orders. I had to leave a message requesting a call back.

## 2024-04-22 NOTE — Telephone Encounter (Signed)
 I spoke to [employer] and inquired about the need for ongoing OT as Dr Vicci had authorized OT for short term therapy and she has now received a request to re-certify for OT through 06/07/2024.   Earnie explained that they worked with the patient to de-clutter her home and made it safer for her to navigate throughout the home and made it easier to reach items in the home. She said they were getting ready to discharge the patient and she is now asking for OT to continue because the arthritis in her hands is making it difficult for her to get dressed and complete her ADLs.  They would like to work on skills to address this issue   I told Earnie that I would share this with Dr Vicci but there is no guarantee that ongoing OT will be approved.  Earnie said she understood

## 2024-04-23 ENCOUNTER — Other Ambulatory Visit: Payer: Self-pay | Admitting: Internal Medicine

## 2024-04-23 DIAGNOSIS — I10 Essential (primary) hypertension: Secondary | ICD-10-CM

## 2024-04-23 NOTE — Telephone Encounter (Signed)
 I spoke to [employer] and and informed her that Dr Vicci is authorizing OT for 1-2 weeks starting today and then the OT services need to stop.  Earnie said she understood and would email me the order for Dr Vicci to sign

## 2024-04-23 NOTE — Telephone Encounter (Signed)
Thanks Jane

## 2024-04-24 ENCOUNTER — Ambulatory Visit: Attending: Cardiology

## 2024-04-24 DIAGNOSIS — Z7901 Long term (current) use of anticoagulants: Secondary | ICD-10-CM | POA: Diagnosis present

## 2024-04-24 DIAGNOSIS — I482 Chronic atrial fibrillation, unspecified: Secondary | ICD-10-CM | POA: Insufficient documentation

## 2024-04-24 LAB — POCT INR: INR: 3.4 — AB (ref 2.0–3.0)

## 2024-04-24 NOTE — Progress Notes (Signed)
 INR 3.4 Please see anticoagulation encounter Hold tonight only then Continue Warfarin 1 tablet daily except 1/2 tablet on Sundays and Thursdays.  Recheck INR in 6 weeks.  Coumadin  Clinic [phone]

## 2024-04-24 NOTE — Telephone Encounter (Signed)
 Orders for initial OT eval and services from 9/24/-04/11/2024 received and given to Dr Vicci for signature

## 2024-04-24 NOTE — Progress Notes (Deleted)
" °  Cardiology Office Note   Date:  04/24/2024  ID:  Jacqueline Palmer, Jacqueline Palmer 1946-04-08, MRN [id number] PCP: [name], MD  [facility] Providers Cardiologist:  Shelda Bruckner, MD Electrophysiologist:  OLE ONEIDA HOLTS, MD { Click to update primary MD,subspecialty MD or APP then REFRESH:1}    History of Present Illness Jacqueline Palmer is a 78 y.o. female with history of hypertension, CAD s/p 4V CABG, maze, LAA ligation, bioprosthetic MVR (for rheumatic heart disease) 11/17/2015, permanent A-fib (AV node ablation 09/11/2016 at [facility]), BiV-ICD, chronic diastolic heart failure with recovered systolic dysfunction, hypothyroidism, CKD stage IV, metastatic renal cancer s/p radical nephrectomy and craniotomy for resection of brain tumor.    She was previously followed by Dr. Veneda at [facility] for [facility].  She does have permanent atrial fibrillation and is on Coumadin .  She established with Dr. Bruckner September 2021.  Her last echo was 12/03/2020 and shows LVEF of 66 5%, normal LV function, no RWMA, and mild dilation of ascending aorta (42 mm).  Last seen by cardiology 11/21/2022 doing well with some fatigue with XRT therapy.  Last seen by EP 02/14/2024 with no concerns and stable device findings.    Jacqueline Palmer presents today for 31-month follow-up for coronary artery disease.   1.  CRT/D- She is followed by EP clinic and was last seen 02/14/24. They noted she was doing well and device was stable.   2.  Permanent atrial fibrillation/hypercoagulable state- She is followed by Coumadin  clinic and was last seen 12//2025.  Continue Coumadin .   3.  CAD/HLD, LDL goal less than 70- She feels ***denies chest pain, shortness of breath, dizziness, lightheadedness, syncope, and swelling.  Continue hydralazine  10 mg 3 times daily, metoprolol  succinate 25 mg, pravastatin  40 mg, spironolactone  25 mg, and torsemide  80 mg.  4.  Hypertension- BP today ***BP at home***continue hydralazine  10 mg 3 times daily,  metoprolol  succinate 25 mg, and spironolactone  25 mg.  5.  S/p MVR- No evidence of mitral valve regurgitation on echo 12/03/2020.  She reports***and activity levels***  6.  CKD-follows with Dr. Dennise at [facility].  7.  TAA-PET 09/2022 stable 4.3 cm ascending thoracic aortic aneurysm. Continue BP control and avoid flouroquinolones.   8.  NICM with recovered LVEF- Today she appears*** GDMT metoprolol , torsemide , and spironolactone .  SGLT2 due to renal disease.NYHA Class ***   ROS: All systems are negative unless indicated in HPI.  Studies Reviewed      *** Risk Assessment/Calculations {Does this patient have ATRIAL FIBRILLATION?[phone]} No BP recorded.  {Refresh Note OR Click here to enter BP  :1}***       Physical Exam VS:  There were no vitals taken for this visit.       Wt Readings from Last 3 Encounters:  03/27/24 186 lb (84.4 kg)  03/25/24 187 lb (84.8 kg)  02/14/24 184 lb (83.5 kg)    GEN: Well nourished, well developed in no acute distress NECK: No JVD; No carotid bruits CARDIAC: ***RRR, no murmurs, rubs, gallops RESPIRATORY:  Clear to auscultation without rales, wheezing or rhonchi  ABDOMEN: Soft, non-tender, non-distended EXTREMITIES:  No edema; No deformity   ASSESSMENT AND PLAN ***    {Are you ordering a CV Procedure (e.g. stress test, cath, DCCV, TEE, etc)?   Press F2        :[id number]}  Dispo: ***  Signed, Mardy NOVAK Pizza, FNP  "

## 2024-04-24 NOTE — Patient Instructions (Signed)
 Hold tonight only then Continue Warfarin 1 tablet daily except 1/2 tablet on Sundays and Thursdays.  Recheck INR in 6 weeks.  Coumadin  Clinic [phone]

## 2024-04-24 NOTE — Telephone Encounter (Signed)
 Signed initial orders emailed to [facility] :  [email or web address].

## 2024-04-25 ENCOUNTER — Ambulatory Visit (HOSPITAL_BASED_OUTPATIENT_CLINIC_OR_DEPARTMENT_OTHER)

## 2024-04-29 ENCOUNTER — Ambulatory Visit: Payer: Medicare Other | Attending: Internal Medicine

## 2024-04-29 VITALS — Ht 68.0 in | Wt 188.0 lb

## 2024-04-29 DIAGNOSIS — Z0000 Encounter for general adult medical examination without abnormal findings: Secondary | ICD-10-CM

## 2024-04-29 NOTE — Patient Instructions (Signed)
 Jacqueline Palmer,  Thank you for taking the time for your Medicare Wellness Visit. I appreciate your continued commitment to your health goals. Please review the care plan we discussed, and feel free to reach out if I can assist you further.  Please note that Annual Wellness Visits do not include a physical exam. Some assessments may be limited, especially if the visit was conducted virtually. If needed, we may recommend an in-person follow-up with your provider.  Ongoing Care Seeing your primary care provider every 3 to 6 months helps us  monitor your health and provide consistent, personalized care.   Referrals If a referral was made during today's visit and you haven't received any updates within two weeks, please contact the referred provider directly to check on the status.  Recommended Screenings:  Health Maintenance  Topic Date Due   Hepatitis C Screening  Never done   Medicare Annual Wellness Visit  04/23/2024   Colon Cancer Screening  03/25/2025*   COVID-19 Vaccine (6 - Pfizer risk 2025-26 season) 08/06/2024   DTaP/Tdap/Td vaccine (2 - Td or Tdap) 12/15/2030   Pneumococcal Vaccine for age over 32  Completed   Flu Shot  Completed   Osteoporosis screening with Bone Density Scan  Completed   Zoster (Shingles) Vaccine  Completed   Meningitis B Vaccine  Aged Out   Breast Cancer Screening  Discontinued  *Topic was postponed. The date shown is not the original due date.       04/29/2024   10:40 AM  Advanced Directives  Does Patient Have a Medical Advance Directive? Yes  Type of Estate Agent of [place];Living will  Copy of Healthcare Power of Attorney in Chart? Yes - validated most recent copy scanned in chart (See row information)    Vision: Annual vision screenings are recommended for early detection of glaucoma, cataracts, and diabetic retinopathy. These exams can also reveal signs of chronic conditions such as diabetes and high blood pressure.  Dental:  Annual dental screenings help detect early signs of oral cancer, gum disease, and other conditions linked to overall health, including heart disease and diabetes.  Please see the attached documents for additional preventive care recommendations.

## 2024-04-29 NOTE — Progress Notes (Signed)
 Chief Complaint  Patient presents with   Medicare Wellness    SUBSEQUENT     Subjective:   Jacqueline Palmer is a 78 y.o. female who presents for a Medicare Annual Wellness Visit.  Visit info / Clinical Intake: Medicare Wellness Visit Type:: Subsequent Annual Wellness Visit Persons participating in visit and providing information:: patient Medicare Wellness Visit Mode:: Telephone If telephone:: video declined Since this visit was completed virtually, some vitals may be partially provided or unavailable. Missing vitals are due to the limitations of the virtual format.: Documented vitals are patient reported If Telephone or Video please confirm:: I connected with patient using audio/video enable telemedicine. I verified patient identity with two identifiers, discussed telehealth limitations, and patient agreed to proceed. Patient Location:: HOME Provider Location:: HOME OFFICE Interpreter Needed?: No Pre-visit prep was completed: yes AWV questionnaire completed by patient prior to visit?: yes Date:: 04/28/24 Living arrangements:: in retirement community Patient's Overall Health Status Rating: good Typical amount of pain: some Does pain affect daily life?: (!) yes Are you currently prescribed opioids?: no  Dietary Habits and Nutritional Risks How many meals a day?: 2 Eats fruit and vegetables daily?: yes Most meals are obtained by: having others provide food In the last 2 weeks, have you had any of the following?: none Diabetic:: no  Functional Status Activities of Daily Living (to include ambulation/medication): (!) Needs Assist Feeding: Independent Dressing/Grooming: Independent Bathing: Independent Toileting: Independent Transfer: Independent Ambulation: Independent with device- listed below Home Assistive Devices/Equipment: Johna Finder (specify Type); Beside Commode; Elevated toliet seat; Shower/tub chair; Other (Comment); Eyeglasses (GRAB BARS IN SHOWER/TUB, LIFE  ALERTS) Medication Administration: Independent Home Management (perform basic housework or laundry): Needs assistance (comment) Manage your own finances?: yes Primary transportation is: facility / other Concerns about vision?: no *vision screening is required for [facility]* (WEARS EYEGLASSES) Concerns about hearing?: no  Fall Screening Falls in the past year?: 0 Number of falls in past year: 0 Was there an injury with Fall?: 0 Fall Risk Category Calculator: 0 Patient Fall Risk Level: Low Fall Risk  Fall Risk Patient at Risk for Falls Due to: Impaired balance/gait Fall risk Follow up: Falls evaluation completed; Education provided  Home and Transportation Safety: All rugs have non-skid backing?: N/A, no rugs All stairs or steps have railings?: N/A, no stairs Grab bars in the bathtub or shower?: yes Have non-skid surface in bathtub or shower?: yes Good home lighting?: yes Regular seat belt use?: yes Hospital stays in the last year:: no  Cognitive Assessment Difficulty concentrating, remembering, or making decisions? : no Will 6CIT or Mini Cog be Completed: yes What year is it?: 0 points What month is it?: 0 points Give patient an address phrase to remember (5 components): BETTY JONES [address] About what time is it?: 0 points Count backwards from 20 to 1: 0 points Say the months of the year in reverse: 0 points Repeat the address phrase from earlier: 0 points 6 CIT Score: 0 points  Advance Directives (For Healthcare) Does Patient Have a Medical Advance Directive?: Yes Type of Advance Directive: Healthcare Power of [place]; Living will Copy of Healthcare Power of Attorney in Chart?: Yes - validated most recent copy scanned in chart (See row information) Copy of Living Will in Chart?: Yes - validated most recent copy scanned in chart (See row information)  Reviewed/Updated  Reviewed/Updated: Reviewed All (Medical, Surgical, Family, Medications, Allergies, Care Teams,  Patient Goals)    Allergies (verified) Avocado, Banana, Plantain, Ace inhibitors, Codeine, Prednisone ,  Tape, Latex, and Pharmabase cosmetic [aquamed]   Current Medications (verified) Outpatient Encounter Medications as of 04/29/2024  Medication Sig   acetaminophen  (TYLENOL ) 500 MG tablet Take 1,000 mg by mouth daily with breakfast.   colchicine  0.6 MG tablet Take 1 tablet (0.6 mg total) by mouth at bedtime.   COVID-19 mRNA vaccine, Pfizer, (COMIRNATY ) syringe Inject into the muscle.   hydrALAZINE  (APRESOLINE ) 10 MG tablet Take 1 tablet (10 mg total) by mouth 3 (three) times daily.   levETIRAcetam  (KEPPRA ) 500 MG tablet Take 1 tablet (500 mg total) by mouth 2 (two) times daily.   levothyroxine  (SYNTHROID ) 25 MCG tablet TAKE 1 TABLET BY MOUTH ONCE DAILY IN THE MORNING 30 TO 60 MINUTES BEFORE BREAKFAST ON AN EMPTY STOMACH WITH A FULL GLASS OF WATER    methocarbamol  (ROBAXIN ) 500 MG tablet Take 2 tablets (1,000 mg total) by mouth every 8 (eight) hours as needed. For muscle spasm   metoprolol  succinate (TOPROL -XL) 25 MG 24 hr tablet Take 1 tablet (25 mg total) by mouth daily.   nitroGLYCERIN  (NITROSTAT ) 0.4 MG SL tablet Place 1 tablet (0.4 mg total) under the tongue every 5 (five) minutes as needed for chest pain.   ondansetron  (ZOFRAN -ODT) 4 MG disintegrating tablet Take 1 tablet (4 mg total) by mouth every 8 (eight) hours as needed for nausea or vomiting.   polyethylene glycol (MIRALAX  / GLYCOLAX ) 17 g packet Take 17 g by mouth daily as needed for mild constipation.   pravastatin  (PRAVACHOL ) 40 MG tablet TAKE ONE TABLET BY MOUTH ONCE A DAY   spironolactone  (ALDACTONE ) 25 MG tablet TAKE ONE TABLET BY MOUTH ONCE A DAY   torsemide  (DEMADEX ) 20 MG tablet TAKE TWO TABLETS BY MOUTH IN THE MORNING AND ONE AT NIGHT   warfarin (COUMADIN ) 5 MG tablet TAKE ONE TABLET BY MOUTH ONCE A DAY IN THE EVENING OR AS DIRECTED BY THE COUMADIN  CLINIC   No facility-administered encounter medications on file as of  04/29/2024.    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   SN [id number] Model [id number] [From patient's surgical info card]   Presence of permanent cardiac pacemaker    Seizures (HCC)    Thrombus 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 myocardial infarction    Thyroid disease    Past Surgical History:  Procedure Laterality Date   ABDOMINAL HYSTERECTOMY     CORONARY ARTERY BYPASS GRAFT     CRANIOTOMY Right 07/30/2019   Right frontal   NEPHRECTOMY Right 10/16/2019   TOTAL HIP ARTHROPLASTY Right 07/11/2021   Procedure: RIGHT TOTAL HIP ARTHROPLASTY ANTERIOR APPROACH;  Surgeon: Liam Lerner, MD;  Location: [facility];  Service: Orthopedics;  Laterality: Right;   Family History  Problem Relation Age of Onset   Heart attack Mother    Heart attack Father    Heart attack Sister    Breast cancer Neg Hx    Social History   Occupational History   Occupation: Retired Orthoptist  Tobacco Use   Smoking status: Former  Current packs/day: 0.00    Types: Cigarettes    Quit date: 07/30/2014    Years since quitting: 9.7   Smokeless tobacco: Never  Vaping Use   Vaping status: Never Used  Substance and Sexual Activity   Alcohol use: Yes    Comment: 1 glass wine twice weekly   Drug use: No   Sexual activity: Not on file   Tobacco Counseling Counseling given: Not Answered  SDOH Screenings   Food Insecurity: No Food Insecurity (04/29/2024)  Housing: Low Risk  (04/29/2024)  Transportation Needs: No Transportation Needs (04/29/2024)  Utilities: Not At Risk (04/29/2024)  Alcohol  Screen: Low Risk  (03/24/2024)  Depression (PHQ2-9): Low Risk  (04/29/2024)  Financial Resource Strain: Low Risk  (03/24/2024)  Physical Activity: Sufficiently Active (04/29/2024)  Recent Concern: Physical Activity - Insufficiently Active (03/24/2024)  Social Connections: Moderately Integrated (04/29/2024)  Stress: No Stress Concern Present (04/29/2024)  Tobacco Use: Medium Risk (04/29/2024)  Health Literacy: Adequate Health Literacy (04/29/2024)   See flowsheets for full screening details  Depression Screen PHQ 2 & 9 Depression Scale- Over the past 2 weeks, how often have you been bothered by any of the following problems? Little interest or pleasure in doing things: 0 Feeling down, depressed, or hopeless (PHQ Adolescent also includes...irritable): 0 PHQ-2 Total Score: 0 Trouble falling or staying asleep, or sleeping too much: 0 Feeling tired or having little energy: 0 Poor appetite or overeating (PHQ Adolescent also includes...weight loss): 0 Feeling bad about yourself - or that you are a failure or have let yourself or your family down: 0 Trouble concentrating on things, such as reading the newspaper or watching television (PHQ Adolescent also includes...like school work): 0 Moving or speaking so slowly that other people could have noticed. Or the opposite - being so fidgety or restless that you have been moving around a lot more than usual: 0 Thoughts that you would be better off dead, or of hurting yourself in some way: 0 PHQ-9 Total Score: 0 If you checked off any problems, how difficult have these problems made it for you to do your work, take care of things at home, or get along with other people?: Not difficult at all     Goals Addressed             This Visit's Progress    04/29/2024: I would like for my appointments to be at 4-6 time frame.  To be able to walk independently without the use of an assistive device.               Objective:    Today's Vitals   04/29/24 1041   Weight: 188 lb (85.3 kg)  Height: 5' 8 (1.727 m)  PainSc: 0-No pain   Body mass index is 28.59 kg/m.  Hearing/Vision screen No results found. Immunizations and Health Maintenance Health Maintenance  Topic Date Due   Hepatitis C Screening  Never done   Colonoscopy  03/25/2025 (Originally 08/04/2022)   COVID-19 Vaccine (6 - Pfizer risk 2025-26 season) 08/06/2024   Medicare Annual Wellness (AWV)  04/29/2025   DTaP/Tdap/Td (2 - Td or Tdap) 12/15/2030   Pneumococcal Vaccine: 50+ Years  Completed   Influenza Vaccine  Completed   Bone Density Scan  Completed   Zoster Vaccines- Shingrix  Completed   Meningococcal B Vaccine  Aged Out   Mammogram  Discontinued        Assessment/Plan:  This is a routine wellness examination for Ladd.  Patient Care Team: Vicci Sober  B, MD as PCP - General (Internal Medicine) Cindie Ole DASEN, MD as PCP - Electrophysiology (Cardiology) Lonni Slain, MD as PCP - Cardiology (Cardiology) Cary No, NP as Nurse Practitioner (Family Medicine) Buckley Zachary K, MD as Consulting Physician (Psychiatry)  I have personally reviewed and noted the following in the patient's chart:   Medical and social history Use of alcohol, tobacco or illicit drugs  Current medications and supplements including opioid prescriptions. Functional ability and status Nutritional status Physical activity Advanced directives List of other physicians Hospitalizations, surgeries, and ER visits in previous 12 months Vitals Screenings to include cognitive, depression, and falls Referrals and appointments  No orders of the defined types were placed in this encounter.  In addition, I have reviewed and discussed with patient certain preventive protocols, quality metrics, and best practice recommendations. A written personalized care plan for preventive services as well as general preventive health recommendations were provided to patient.   Roz LOISE Fuller,  LPN   [id number]   Return in 1 year (on 04/29/2025).  After Visit Summary: (MyChart) Due to this being a telephonic visit, the after visit summary with patients personalized plan was offered to patient via MyChart   Nurse Notes: None at this time.

## 2024-05-03 ENCOUNTER — Other Ambulatory Visit: Payer: Self-pay | Admitting: Cardiology

## 2024-05-03 DIAGNOSIS — I482 Chronic atrial fibrillation, unspecified: Secondary | ICD-10-CM

## 2024-05-13 ENCOUNTER — Ambulatory Visit: Admitting: "Endocrinology

## 2024-05-27 ENCOUNTER — Ambulatory Visit (HOSPITAL_COMMUNITY)
Admission: RE | Admit: 2024-05-27 | Discharge: 2024-05-27 | Disposition: A | Discharge status: Home / Self Care | Source: Ambulatory Visit | Attending: Internal Medicine | Admitting: Internal Medicine

## 2024-05-27 ENCOUNTER — Encounter (HOSPITAL_COMMUNITY): Payer: Self-pay

## 2024-05-27 ENCOUNTER — Inpatient Hospital Stay: Attending: Internal Medicine

## 2024-05-27 DIAGNOSIS — N189 Chronic kidney disease, unspecified: Secondary | ICD-10-CM | POA: Insufficient documentation

## 2024-05-27 DIAGNOSIS — I482 Chronic atrial fibrillation, unspecified: Secondary | ICD-10-CM | POA: Diagnosis not present

## 2024-05-27 DIAGNOSIS — C7931 Secondary malignant neoplasm of brain: Secondary | ICD-10-CM | POA: Insufficient documentation

## 2024-05-27 DIAGNOSIS — C641 Malignant neoplasm of right kidney, except renal pelvis: Secondary | ICD-10-CM | POA: Diagnosis present

## 2024-05-27 DIAGNOSIS — Z7901 Long term (current) use of anticoagulants: Secondary | ICD-10-CM | POA: Insufficient documentation

## 2024-05-27 DIAGNOSIS — Z923 Personal history of irradiation: Secondary | ICD-10-CM | POA: Diagnosis not present

## 2024-05-27 DIAGNOSIS — Z905 Acquired absence of kidney: Secondary | ICD-10-CM | POA: Insufficient documentation

## 2024-05-27 LAB — CMP (CANCER CENTER ONLY)
ALT: 22 U/L (ref 0–44)
AST: 32 U/L (ref 15–41)
Albumin: 4.4 g/dL (ref 3.5–5.0)
Alkaline Phosphatase: 61 U/L (ref 38–126)
Anion gap: 10 (ref 5–15)
BUN: 37 mg/dL — ABNORMAL HIGH (ref 8–23)
CO2: 25 mmol/L (ref 22–32)
Calcium: 10.1 mg/dL (ref 8.9–10.3)
Chloride: 97 mmol/L — ABNORMAL LOW (ref 98–111)
Creatinine: 3.18 mg/dL — ABNORMAL HIGH (ref 0.44–1.00)
GFR, Estimated: 14 mL/min — ABNORMAL LOW
Glucose, Bld: 105 mg/dL — ABNORMAL HIGH (ref 70–99)
Potassium: 4.7 mmol/L (ref 3.5–5.1)
Sodium: 132 mmol/L — ABNORMAL LOW (ref 135–145)
Total Bilirubin: 1 mg/dL (ref 0.0–1.2)
Total Protein: 7.6 g/dL (ref 6.5–8.1)

## 2024-05-27 LAB — CBC WITH DIFFERENTIAL (CANCER CENTER ONLY)
Abs Immature Granulocytes: 0.03 K/uL (ref 0.00–0.07)
Basophils Absolute: 0.1 K/uL (ref 0.0–0.1)
Basophils Relative: 1 %
Eosinophils Absolute: 0.1 K/uL (ref 0.0–0.5)
Eosinophils Relative: 2 %
HCT: 36.8 % (ref 36.0–46.0)
Hemoglobin: 12.1 g/dL (ref 12.0–15.0)
Immature Granulocytes: 1 %
Lymphocytes Relative: 32 %
Lymphs Abs: 2 K/uL (ref 0.7–4.0)
MCH: 24.2 pg — ABNORMAL LOW (ref 26.0–34.0)
MCHC: 32.9 g/dL (ref 30.0–36.0)
MCV: 73.7 fL — ABNORMAL LOW (ref 80.0–100.0)
Monocytes Absolute: 1.2 K/uL — ABNORMAL HIGH (ref 0.1–1.0)
Monocytes Relative: 20 %
Neutro Abs: 2.8 K/uL (ref 1.7–7.7)
Neutrophils Relative %: 44 %
Platelet Count: 239 K/uL (ref 150–400)
RBC: 4.99 MIL/uL (ref 3.87–5.11)
RDW: 14.6 % (ref 11.5–15.5)
WBC Count: 6.2 K/uL (ref 4.0–10.5)
nRBC: 0 % (ref 0.0–0.2)

## 2024-05-28 NOTE — Progress Notes (Unsigned)
 "    Cardiology Office Note Date:  05/28/2024  Patient ID:  Jacqueline Palmer, Jacqueline Palmer 11-Jul-1945, MRN [id number] PCP:  [name], MD  Cardiologist:  Dr. Lonni Electrophysiologist: Dr. Cindie     Chief Complaint:   annual device visit  History of Present Illness: Jacqueline Palmer is a 79 y.o. female with history of :  HTN, hypothyroidism, HLD CAD/VHD (CABG 2017, MVR (bioprosthetic), MAZE, LAA ligation),  RCC ( s/p R radical nephrectomy w/metastatic disease to brain s/p craniotomy/tumor resection > recurrent disease  > stereotactic radiotherapy in 2021 and 2022)  seizure d/o,  NICM > diastolic, chronic CHF,  AFib (permanent)>> AV node ablation w/ICD  --------------------------------  I saw her 10/26/22 All in all doing OK No CP, palpitations,  denies SOB, DOE No bleeding or signs of bleeding, follows with our coumadin  clinic No near syncope or syncope. No device concerns Undergoing XRT for RCC Sees multiple MDs, all do labs,  nephrologist is Dr. Dennise, keeps close track of her as well.  Saw cards team 11/21/22, no cardiac concerns, complaints, adjustment made to her statin.  Saw Dr. Cindie 01/25/23, living at senior center, walking regularly and participating in social events regularly, no cardiac, device concerns/symptoms Stable device findings  I saw her 02/14/24 She has completed XRT (no plans for chemo, her choice), though so far sounds like scan has been ok. She denies CP, palpitations cardiac awareness No SOB No near syncope or syncope No bleeding or signs of bleeding She inquired about watchman, and if she would be able to come off warfarin/anticoagulation She asks about watchman, wonders if shoe could come off warfarin At the time of her CABG she had LAA ligation (per OR report/record), maintained on anticoagulation. She denies any clotting hx, there are some records that report hx of LAA thrombus (?) her Ca probably does increase her risk/make her  hypercoagulable. We could probably to CT (LA appendage study) to confirm LA ligation is complete She will d/w further with Dr. Lonni Due to see her cards team Device stable, RV lead impedance was OK< and programmed appropriately ~ 1 year to ERI  TODAY   *** device only *** warfarin, bleeding, us  *** lead advisory *** s/p AV node ablation/dependent!  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   SN [id number] Model [id number] [From patient's surgical info card]   Presence of permanent cardiac pacemaker    Seizures (HCC)    Thrombus 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 myocardial infarction    Thyroid disease     Past Surgical History:  Procedure Laterality Date   ABDOMINAL HYSTERECTOMY     CORONARY ARTERY BYPASS GRAFT     CRANIOTOMY Right 07/30/2019   Right frontal   NEPHRECTOMY Right 10/16/2019   TOTAL HIP ARTHROPLASTY Right 07/11/2021   Procedure: RIGHT TOTAL HIP ARTHROPLASTY ANTERIOR APPROACH;  Surgeon: Liam Lerner, MD;  Location: [facility];  Service: Orthopedics;  Laterality: Right;    Current Outpatient Medications  Medication Sig Dispense Refill   acetaminophen  (TYLENOL ) 500 MG tablet Take 1,000 mg by mouth daily with breakfast.     colchicine  0.6 MG tablet Take 1 tablet (0.6 mg total) by mouth at bedtime. 90 tablet 3   COVID-19 mRNA vaccine, Pfizer, (COMIRNATY ) syringe Inject into the muscle. 0.5 mL 0   hydrALAZINE  (APRESOLINE ) 10 MG tablet Take 1 tablet (10 mg total) by mouth 3  (three) times daily. 270 tablet 4   levETIRAcetam  (KEPPRA ) 500 MG tablet Take 1 tablet (500 mg total) by mouth 2 (two) times daily. 180 tablet 3   levothyroxine  (SYNTHROID ) 25 MCG tablet TAKE 1 TABLET BY MOUTH ONCE DAILY IN THE MORNING 30 TO 60 MINUTES BEFORE BREAKFAST ON AN EMPTY STOMACH WITH A FULL GLASS OF WATER  90 tablet 4   methocarbamol  (ROBAXIN ) 500 MG tablet Take 2 tablets (1,000 mg total) by mouth every 8 (eight) hours as needed. For muscle spasm 90 tablet 0   metoprolol  succinate (TOPROL -XL) 25 MG 24 hr tablet Take 1 tablet (25 mg total) by mouth daily. 90 tablet 4   nitroGLYCERIN  (NITROSTAT ) 0.4 MG SL tablet Place 1 tablet (0.4 mg total) under the tongue every 5 (five) minutes as needed for chest pain. 25 tablet 3   ondansetron  (ZOFRAN -ODT) 4 MG disintegrating tablet Take 1 tablet (4 mg total) by mouth every 8 (eight) hours as needed for nausea or vomiting. 20 tablet 0   polyethylene glycol (MIRALAX  / GLYCOLAX ) 17 g packet Take 17 g by mouth daily as needed for mild constipation.     pravastatin  (PRAVACHOL ) 40 MG tablet TAKE ONE TABLET BY MOUTH ONCE A DAY 90 tablet 2   spironolactone  (ALDACTONE ) 25 MG tablet TAKE ONE TABLET BY MOUTH ONCE A DAY 90 tablet 0   torsemide  (DEMADEX ) 20 MG tablet TAKE TWO TABLETS BY MOUTH IN THE MORNING AND ONE AT NIGHT 270 tablet 0   warfarin (COUMADIN ) 5 MG tablet TAKE ONE TABLET BY MOUTH ONCE A DAY IN THE EVENING OR AS DIRECTED BY THE COUMADIN  CLINIC 100 tablet 1   No current facility-administered medications for this visit.    Allergies:   Avocado, Banana, Plantain, Ace inhibitors, Codeine, Prednisone , Tape, Latex, and Pharmabase cosmetic [aquamed]   Social History:  The patient  reports that she quit smoking about 9 years ago. Her smoking use included cigarettes. She has never used smokeless tobacco. She reports current alcohol use. She reports that she does not use drugs.   Family History:  The patient's family history includes Heart attack in her  father, mother, and sister.  ROS:  Please see the history of present illness.    All other systems are reviewed and otherwise negative.   PHYSICAL EXAM:  VS:  There were no vitals taken for this visit. BMI: There is no height or weight on file to calculate BMI. Well nourished, well developed, in no acute distress HEENT: normocephalic, atraumatic Neck: no JVD, carotid bruits or masses Cardiac: *** RRR; (paced), no significant murmurs, no rubs, or gallops Lungs: *** CTA b/l, no wheezing, rhonchi or rales Abd: soft, nontender MS: no deformity or atrophy Ext: *** no edema Skin: warm and dry, no rash Neuro:  No gross deficits appreciated Psych: euthymic mood, full affect  *** ICD site is stable, no tethering or discomfort   EKG:  not done today  Device interrogation *** done today and reviewed by myself:  Battery and *** auto lead measurements are good ~  ***  to ERI *** No VT HV lead impedance is ***  Ohms    Echo 12/03/20 1. Left ventricular ejection fraction, by estimation, is 60 to 65%. The  left ventricle has normal function. The left ventricle has no regional  wall motion abnormalities. Left ventricular diastolic function could not  be evaluated.   2. Right ventricular systolic function is normal. The right ventricular  size is not well visualized. There is normal pulmonary artery systolic  pressure. The estimated right ventricular systolic pressure is 25.8 mmHg.   3. Left atrial size was moderately dilated.   4. Right atrial size was mildly dilated.   5. The mitral valve has been repaired/replaced. No evidence of mitral  valve regurgitation. The mean mitral valve gradient is 3.0 mmHg. There is  a present in the mitral position.   6. The aortic valve is tricuspid. Aortic valve regurgitation is trivial.  No aortic stenosis is present.   7. Aortic dilatation noted. There is mild dilatation of the ascending  aorta, measuring 42 mm.   8. The inferior vena cava is normal in  size with greater than 50%  respiratory variability, suggesting right atrial pressure of 3 mmHg.  Recent Labs: 07/31/2023: Magnesium  2.0 03/25/2024: TSH 2.980 05/27/2024: ALT 22; BUN 37; Creatinine 3.18; Hemoglobin 12.1; Platelet Count 239; Potassium 4.7; Sodium 132  No results found for requested labs within last 365 days.   CrCl cannot be calculated (Unknown ideal weight.).   Wt Readings from Last 3 Encounters:  04/29/24 188 lb (85.3 kg)  03/27/24 186 lb (84.4 kg)  03/25/24 187 lb (84.8 kg)     Other studies reviewed: Additional studies/records reviewed today include: summarized above  ASSESSMENT AND PLAN:  CRT-D *** Intact function *** No programming changes made  RV lead Is on advisory (single coil) *** Ohms *** programmed appropriately    Permanent AFib CHA2DS2Vasc is 6, on warfarin, monitored and managed by us  Hx of AVN ablation *** % paced/dependent  *** She asks about watchman, wonders if shoe could come off warfarin At the time of her CABG she had LAA ligation (per OR report/record), maintained on anticoagulation. She denies any clotting hx, there are some records that report hx of LAA thrombus (?) her Ca probably does increase her risk/make her hypercoagulable. We could probably to CT (LA appendage study) to confirm LA ligation is complete  *** She will d/w further with Dr. Lonni   CAD *** No anginal symptoms  *** On BB/statin, no ASA w/warfarin C/w Dr. Lucinda  VHD S/p MVR (bioprosthetic)  NICM Recovered LVEF (by echo 2022) *** No symptoms or exam findings of volume OL BP ***% Heart logic score is *** C/w Dr. Christopher/nephrology/teams  6.  Secondary hypercoagulable state   Disposition: remotes as usual, in clinic with EP again *** otherwise, sooner if needed.      Current medicines are reviewed at length with the patient today.  The patient did not have any concerns regarding medicines.  Bonney Charlies Arthur,  PA-C 05/28/2024 12:40 PM     [facility] [address] [place] [address] [phone] (office)  [phone] (fax)   "

## 2024-05-29 ENCOUNTER — Ambulatory Visit

## 2024-05-29 ENCOUNTER — Ambulatory Visit: Admitting: Physician Assistant

## 2024-05-29 DIAGNOSIS — I482 Chronic atrial fibrillation, unspecified: Secondary | ICD-10-CM

## 2024-05-30 ENCOUNTER — Telehealth: Payer: Self-pay | Admitting: Internal Medicine

## 2024-06-02 LAB — CUP PACEART REMOTE DEVICE CHECK
Battery Remaining Longevity: 12 mo
Battery Remaining Percentage: 15 %
Brady Statistic RA Percent Paced: 0 %
Brady Statistic RV Percent Paced: 100 %
Date Time Interrogation Session: 20260109151100
HighPow Impedance: 80 Ohm
Implantable Lead Connection Status: 753985
Implantable Lead Connection Status: 753985
Implantable Lead Connection Status: 753985
Implantable Lead Implant Date: 20180423
Implantable Lead Implant Date: 20180423
Implantable Lead Implant Date: 20180423
Implantable Lead Location: 753858
Implantable Lead Location: 753859
Implantable Lead Location: 753860
Implantable Lead Model: 292
Implantable Lead Model: 4672
Implantable Lead Model: 7740
Implantable Lead Serial Number: 431793
Implantable Lead Serial Number: 602825
Implantable Lead Serial Number: 703305
Implantable Pulse Generator Implant Date: 20180423
Lead Channel Impedance Value: 1000 Ohm
Lead Channel Impedance Value: 425 Ohm
Lead Channel Impedance Value: 550 Ohm
Lead Channel Pacing Threshold Amplitude: 0.5 V
Lead Channel Pacing Threshold Amplitude: 0.5 V
Lead Channel Pacing Threshold Pulse Width: 0.4 ms
Lead Channel Pacing Threshold Pulse Width: 0.4 ms
Lead Channel Setting Pacing Amplitude: 1.6 V
Lead Channel Setting Pacing Amplitude: 2 V
Lead Channel Setting Pacing Pulse Width: 0.4 ms
Lead Channel Setting Pacing Pulse Width: 0.4 ms
Lead Channel Setting Sensing Sensitivity: 0.6 mV
Lead Channel Setting Sensing Sensitivity: 1 mV
Pulse Gen Serial Number: 174046
Zone Setting Status: 755011

## 2024-06-03 ENCOUNTER — Ambulatory Visit: Payer: Self-pay | Admitting: Cardiovascular Disease

## 2024-06-03 NOTE — Progress Notes (Signed)
 Remote ICD Transmission

## 2024-06-05 ENCOUNTER — Ambulatory Visit (INDEPENDENT_AMBULATORY_CARE_PROVIDER_SITE_OTHER)

## 2024-06-05 ENCOUNTER — Inpatient Hospital Stay: Admitting: Internal Medicine

## 2024-06-05 ENCOUNTER — Ambulatory Visit: Admitting: Internal Medicine

## 2024-06-05 VITALS — BP 122/72 | HR 70 | Temp 97.7°F | Resp 17 | Ht 68.0 in

## 2024-06-05 DIAGNOSIS — C641 Malignant neoplasm of right kidney, except renal pelvis: Secondary | ICD-10-CM | POA: Diagnosis not present

## 2024-06-05 DIAGNOSIS — I482 Chronic atrial fibrillation, unspecified: Secondary | ICD-10-CM | POA: Insufficient documentation

## 2024-06-05 DIAGNOSIS — Z7901 Long term (current) use of anticoagulants: Secondary | ICD-10-CM | POA: Insufficient documentation

## 2024-06-05 LAB — POCT INR: INR: 1.5 — AB (ref 2.0–3.0)

## 2024-06-05 NOTE — Progress Notes (Signed)
"   INR 1.5 Please see anticoagulation encounter Take 1 tablet today  only then Continue Warfarin 1 tablet daily except 1/2 tablet on Sundays and Thursdays. Greens 2 per week Recheck INR in 4 weeks.  Coumadin  Clinic [phone] "

## 2024-06-05 NOTE — Progress Notes (Signed)
 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  PRIOR THERAPY:  1) Status post right frontal craniotomy and resection of solitary brain metastasis in March 2021 followed by postoperative SRS at [facility]. 2) Status post right radical nephrectomy on Oct 16, 2019 with the final pathologic stage of pT3a, pN0.  The final pathology showed indeterminant subtype with nuclear grade 3. 3) Status post stereotactic radiotherapy on March 03, 2020 to metastatic brain metastasis when the patient developed 2.0 cm mass in the right frontal lobe consistent with recurrent disease. 4) Status post SRS on February 08, 2021 for another isolated left frontal lobe metastasis. 5) palliative radiotherapy to the soft tissue nodule along the superior margin of the nephrectomy bed that was hypermetabolic on the PET scan under the care of Dr. Dewey.  CURRENT THERAPY: Observation.  INTERVAL HISTORY: Jacqueline Palmer 79 y.o. female returns to the clinic today for follow-up visit.Discussed the use of AI scribe software for clinical note transcription with the patient, who gave verbal consent to proceed.  History of Present Illness Jacqueline Palmer is a 79 year old female with stage IV papillary renal cell carcinoma with brain and soft tissue metastases who presents for routine oncology follow-up and disease surveillance.  She was diagnosed with stage IV papillary renal cell carcinoma in January 2021 and has undergone right radical nephrectomy, stereotactic radiotherapy to brain metastasis, and palliative radiotherapy to a soft tissue nodule and the nephrectomy bed. She is currently under observation and presents for evaluation with  repeat CT imaging of the chest, abdomen, and pelvis to assess disease status. She reports no new symptoms or complaints related to her malignancy and feels well overall.  She experienced a self-limited flu-like illness over the past two weeks, which resolved approximately one week ago. She currently feels well.  She inquired about a possible abdominal aneurysm noted on prior imaging. Imaging has shown a stable dilatation of the ascending aorta, which has remained unchanged on serial studies and is asymptomatic.  She is followed by nephrology for chronic kidney disease with worsening renal function. She saw her nephrologist one week ago, at which time her antihypertensive medication was discontinued due to hypotension. Her blood pressure has since improved, and she continues to monitor her renal function with nephrology.     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  MEDICATIONS:  Current Outpatient Medications  Medication Sig Dispense Refill   acetaminophen  (TYLENOL ) 500 MG tablet Take 1,000 mg by mouth daily with breakfast.     colchicine  0.6 MG tablet Take 1 tablet (0.6 mg total) by mouth at bedtime. 90  tablet 3   COVID-19 mRNA vaccine, Pfizer, (COMIRNATY ) syringe Inject into the muscle. 0.5 mL 0   hydrALAZINE  (APRESOLINE ) 10 MG tablet Take 1 tablet (10 mg total) by mouth 3 (three) times daily. 270 tablet 4   levETIRAcetam  (KEPPRA ) 500 MG tablet Take 1 tablet (500 mg total) by mouth 2 (two) times daily. 180 tablet 3   levothyroxine  (SYNTHROID ) 25 MCG tablet TAKE 1 TABLET BY MOUTH ONCE DAILY IN THE MORNING 30 TO 60 MINUTES BEFORE BREAKFAST ON AN EMPTY STOMACH WITH A FULL GLASS OF WATER  90 tablet 4   methocarbamol  (ROBAXIN ) 500 MG tablet Take 2 tablets (1,000 mg total) by mouth every 8 (eight) hours as needed. For muscle spasm 90 tablet 0   metoprolol  succinate (TOPROL -XL) 25 MG 24 hr tablet Take 1 tablet (25 mg total) by mouth daily. 90 tablet 4   nitroGLYCERIN  (NITROSTAT ) 0.4 MG SL tablet Place 1 tablet (0.4 mg total) under the tongue every 5 (five) minutes as needed for chest pain. 25 tablet 3   ondansetron  (ZOFRAN -ODT) 4 MG disintegrating tablet Take 1 tablet (4 mg total) by mouth every 8 (eight) hours as needed for nausea or vomiting. 20 tablet 0   polyethylene glycol (MIRALAX  / GLYCOLAX ) 17 g packet Take 17 g by mouth daily as needed for mild constipation.     pravastatin  (PRAVACHOL ) 40 MG tablet TAKE ONE TABLET BY MOUTH ONCE A DAY 90 tablet 2   spironolactone  (ALDACTONE ) 25 MG tablet TAKE ONE TABLET BY MOUTH ONCE A DAY 90 tablet 0   torsemide  (DEMADEX ) 20 MG tablet TAKE TWO TABLETS BY MOUTH IN THE MORNING AND ONE AT NIGHT 270 tablet 0   warfarin (COUMADIN ) 5 MG tablet TAKE ONE TABLET BY MOUTH ONCE A DAY IN THE EVENING OR AS DIRECTED BY THE COUMADIN  CLINIC 100 tablet 1   No current facility-administered medications for this visit.    SURGICAL HISTORY:  Past Surgical History:  Procedure Laterality Date   ABDOMINAL HYSTERECTOMY     CORONARY ARTERY BYPASS GRAFT     CRANIOTOMY Right 07/30/2019   Right frontal   NEPHRECTOMY Right 10/16/2019   TOTAL HIP ARTHROPLASTY Right 07/11/2021    Procedure: RIGHT TOTAL HIP ARTHROPLASTY ANTERIOR APPROACH;  Surgeon: Liam Lerner, MD;  Location: [facility];  Service: Orthopedics;  Laterality: Right;    REVIEW OF SYSTEMS:  Constitutional: negative Eyes: negative Ears, nose, mouth, throat, and face: negative Respiratory: negative Cardiovascular: negative Gastrointestinal: negative Genitourinary:negative Integument/breast: negative Hematologic/lymphatic: negative Musculoskeletal:negative Neurological: negative Behavioral/Psych: negative Endocrine: negative Allergic/Immunologic: negative   PHYSICAL EXAMINATION: General appearance: alert, cooperative, fatigued, and no distress Head: Normocephalic, without obvious abnormality, atraumatic Neck: no adenopathy, no JVD, supple, symmetrical, trachea midline, and thyroid not enlarged, symmetric, no tenderness/mass/nodules Lymph nodes: Cervical, supraclavicular, and axillary nodes normal. Resp: clear to auscultation bilaterally Back: symmetric, no curvature. ROM normal. No CVA tenderness. Cardio: regular rate and rhythm, S1, S2 normal, no murmur, click, rub or gallop GI: soft, non-tender; bowel sounds normal; no masses,  no organomegaly Extremities: extremities normal, atraumatic, no cyanosis or edema Neurologic: Alert and oriented X 3, normal strength and tone. Normal symmetric reflexes. Normal coordination and gait  ECOG PERFORMANCE STATUS: 1 - Symptomatic but completely ambulatory  Blood pressure 122/72, pulse 70,  temperature 97.7 F (36.5 C), temperature source Temporal, resp. rate 17, height 5' 8 (1.727 m), SpO2 100%.  LABORATORY DATA: Lab Results  Component Value Date   WBC 6.2 05/27/2024   HGB 12.1 05/27/2024   HCT 36.8 05/27/2024   MCV 73.7 (L) 05/27/2024   PLT 239 05/27/2024      Chemistry      Component Value Date/Time   NA 132 (L) 05/27/2024 1006   NA 141 12/14/2020 1053   K 4.7 05/27/2024 1006   CL 97 (L) 05/27/2024 1006   CO2 25 05/27/2024 1006   BUN 37 (H)  05/27/2024 1006   BUN 45 (H) 12/14/2020 1053   CREATININE 3.18 (H) 05/27/2024 1006      Component Value Date/Time   CALCIUM 10.1 05/27/2024 1006   ALKPHOS 61 05/27/2024 1006   AST 32 05/27/2024 1006   ALT 22 05/27/2024 1006   BILITOT 1.0 05/27/2024 1006       RADIOGRAPHIC STUDIES: CUP PACEART REMOTE DEVICE CHECK Result Date: 06/02/2024 ICD Scheduled remote reviewed. Normal device function.  Presenting rhythm: AF-BIV paced.  Known history of AF/AFL, on warfarin per Epic. Next remote transmission per protocol. - CS, CVRSMRI Protection last programmed on Mar 18, 2024. Beeper is OFF.  CT Chest Wo Contrast Result Date: 05/27/2024 CLINICAL DATA:  Kidney cancer, staging. * Tracking Code: BO * EXAM: CT CHEST, ABDOMEN AND PELVIS WITHOUT CONTRAST TECHNIQUE: Multidetector CT imaging of the chest, abdomen and pelvis was performed following the standard protocol without IV contrast. RADIATION DOSE REDUCTION: This exam was performed according to the departmental dose-optimization program which includes automated exposure control, adjustment of the mA and/or kV according to patient size and/or use of iterative reconstruction technique. COMPARISON:  CT scan chest, abdomen and pelvis from 11/27/2023. FINDINGS: CT CHEST FINDINGS Cardiovascular: Normal cardiac size. No pericardial effusion. Dilation of the ascending aorta measuring upto 4.4 cm, grossly similar to the prior study. There are coronary artery calcifications, in keeping with coronary artery disease. There are also moderate to severe peripheral atherosclerotic vascular calcifications of thoracic aorta and its major branches. Sternotomy wires noted from prior CABG. There is a left-sided pacemaker. Prosthetic mitral valve noted. Mediastinum/Nodes: Visualized thyroid gland appears grossly unremarkable. No solid / cystic mediastinal masses. The esophagus is nondistended precluding optimal assessment. No mediastinal or axillary lymphadenopathy by size criteria.  Evaluation of bilateral hila is limited due to lack on intravenous contrast: however, no large hilar lymphadenopathy identified. Lungs/Pleura: The central tracheo-bronchial tree is patent. Minimal emphysematous changes noted. No mass or consolidation. No pleural effusion or pneumothorax. There are multiple stable, sub 4 mm noncalcified nodules throughout bilateral lungs (marked with electronic arrow sign on series 2008). No new or suspicious lung nodule. Musculoskeletal: The visualized soft tissues of the chest wall are grossly unremarkable. No suspicious osseous lesions. There are mild multilevel degenerative changes in the visualized spine. CT ABDOMEN PELVIS FINDINGS Hepatobiliary: The liver is normal in size. Non-cirrhotic configuration. No suspicious mass. No intrahepatic or extrahepatic bile duct dilation. The gallbladder is physiologically distended and contains small volume dependent calcified gallstones without imaging signs of acute cholecystitis. Pancreas: Unremarkable. No pancreatic ductal dilatation or surrounding inflammatory changes. Spleen: Within normal limits. No focal lesion. Adrenals/Urinary Tract: Adrenal glands are unremarkable. Right kidney is surgically absent. Redemonstration of well-circumscribed 1.8 x 1.9 cm soft tissue attenuation nodule in the right nephrectomy bed, abutting the right adrenal gland. Disease grossly unchanged since the prior study from 06/07/2023. Left kidney is within normal limits. No focal mass  within the limitations of this unenhanced exam. No nephroureterolithiasis or obstructive uropathy. Evaluation of urinary bladder is limited due to streak artifacts from right hip arthroplasty hardware. However, visualized portion appears within normal limits. Stomach/Bowel: No disproportionate dilation of the small or large bowel loops. No evidence of abnormal bowel wall thickening or inflammatory changes. The appendix is unremarkable. There are multiple diverticula mainly in the  left hemi colon, without imaging signs of diverticulitis. Vascular/Lymphatic: No ascites or pneumoperitoneum. No abdominal or pelvic lymphadenopathy, by size criteria. No aneurysmal dilation of the major abdominal arteries. There are moderate peripheral atherosclerotic vascular calcifications of the aorta and its major branches. Reproductive: The uterus is surgically absent. No large adnexal mass. Other: The visualized soft tissues and abdominal wall are unremarkable. Musculoskeletal: No suspicious osseous lesions. There are mild multilevel degenerative changes in the visualized spine. Right hip arthroplasty noted. IMPRESSION: 1. Redemonstration of postsurgical changes in the right nephrectomy bed. There is a stable 1.8 x 1.9 cm soft tissue attenuation nodule in the right nephrectomy bed, abutting the right adrenal gland. 2. Otherwise, no metastatic disease identified within the chest, abdomen or pelvis. 3. Multiple other nonacute observations, as described above. Aortic Atherosclerosis (ICD10-I70.0) and Emphysema (ICD10-J43.9). Electronically Signed   By: Ree Molt M.D.   On: 05/27/2024 13:35   CT ABDOMEN PELVIS WO CONTRAST Result Date: 05/27/2024 CLINICAL DATA:  Kidney cancer, staging. * Tracking Code: BO * EXAM: CT CHEST, ABDOMEN AND PELVIS WITHOUT CONTRAST TECHNIQUE: Multidetector CT imaging of the chest, abdomen and pelvis was performed following the standard protocol without IV contrast. RADIATION DOSE REDUCTION: This exam was performed according to the departmental dose-optimization program which includes automated exposure control, adjustment of the mA and/or kV according to patient size and/or use of iterative reconstruction technique. COMPARISON:  CT scan chest, abdomen and pelvis from 11/27/2023. FINDINGS: CT CHEST FINDINGS Cardiovascular: Normal cardiac size. No pericardial effusion. Dilation of the ascending aorta measuring upto 4.4 cm, grossly similar to the prior study. There are coronary  artery calcifications, in keeping with coronary artery disease. There are also moderate to severe peripheral atherosclerotic vascular calcifications of thoracic aorta and its major branches. Sternotomy wires noted from prior CABG. There is a left-sided pacemaker. Prosthetic mitral valve noted. Mediastinum/Nodes: Visualized thyroid gland appears grossly unremarkable. No solid / cystic mediastinal masses. The esophagus is nondistended precluding optimal assessment. No mediastinal or axillary lymphadenopathy by size criteria. Evaluation of bilateral hila is limited due to lack on intravenous contrast: however, no large hilar lymphadenopathy identified. Lungs/Pleura: The central tracheo-bronchial tree is patent. Minimal emphysematous changes noted. No mass or consolidation. No pleural effusion or pneumothorax. There are multiple stable, sub 4 mm noncalcified nodules throughout bilateral lungs (marked with electronic arrow sign on series 2008). No new or suspicious lung nodule. Musculoskeletal: The visualized soft tissues of the chest wall are grossly unremarkable. No suspicious osseous lesions. There are mild multilevel degenerative changes in the visualized spine. CT ABDOMEN PELVIS FINDINGS Hepatobiliary: The liver is normal in size. Non-cirrhotic configuration. No suspicious mass. No intrahepatic or extrahepatic bile duct dilation. The gallbladder is physiologically distended and contains small volume dependent calcified gallstones without imaging signs of acute cholecystitis. Pancreas: Unremarkable. No pancreatic ductal dilatation or surrounding inflammatory changes. Spleen: Within normal limits. No focal lesion. Adrenals/Urinary Tract: Adrenal glands are unremarkable. Right kidney is surgically absent. Redemonstration of well-circumscribed 1.8 x 1.9 cm soft tissue attenuation nodule in the right nephrectomy bed, abutting the right adrenal gland. Disease grossly unchanged since the prior study from  06/07/2023. Left  kidney is within normal limits. No focal mass within the limitations of this unenhanced exam. No nephroureterolithiasis or obstructive uropathy. Evaluation of urinary bladder is limited due to streak artifacts from right hip arthroplasty hardware. However, visualized portion appears within normal limits. Stomach/Bowel: No disproportionate dilation of the small or large bowel loops. No evidence of abnormal bowel wall thickening or inflammatory changes. The appendix is unremarkable. There are multiple diverticula mainly in the left hemi colon, without imaging signs of diverticulitis. Vascular/Lymphatic: No ascites or pneumoperitoneum. No abdominal or pelvic lymphadenopathy, by size criteria. No aneurysmal dilation of the major abdominal arteries. There are moderate peripheral atherosclerotic vascular calcifications of the aorta and its major branches. Reproductive: The uterus is surgically absent. No large adnexal mass. Other: The visualized soft tissues and abdominal wall are unremarkable. Musculoskeletal: No suspicious osseous lesions. There are mild multilevel degenerative changes in the visualized spine. Right hip arthroplasty noted. IMPRESSION: 1. Redemonstration of postsurgical changes in the right nephrectomy bed. There is a stable 1.8 x 1.9 cm soft tissue attenuation nodule in the right nephrectomy bed, abutting the right adrenal gland. 2. Otherwise, no metastatic disease identified within the chest, abdomen or pelvis. 3. Multiple other nonacute observations, as described above. Aortic Atherosclerosis (ICD10-I70.0) and Emphysema (ICD10-J43.9). Electronically Signed   By: Ree Molt M.D.   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 The patient has the following treatment: 1) status post right frontal  craniotomy and resection of solitary brain metastasis in March 2021 followed by postoperative SRS at [facility]. 2) Status post right radical nephrectomy on Oct 16, 2019 with the final pathologic stage of pT3a, pN0.  The final pathology showed indeterminant subtype with nuclear grade 3. 3) Status post stereotactic radiotherapy on March 03, 2020 to metastatic brain metastasis when the patient developed 2.0 cm mass in the right frontal lobe consistent with recurrent disease. 4) Status post SRS on February 08, 2021 for another isolated left frontal lobe metastasis. 5) palliative radiotherapy to the hypermetabolic soft tissue nodule in the right nephrectomy bed under the care of Dr. Dewey The patient is currently on observation and she is feeling fine. She had repeat CT scan of the chest, abdomen and pelvis performed recently.  I personally independently reviewed the scan and discussed the result with the patient today.  Her scan showed no concerning findings for disease progression. Assessment and Plan Assessment & Plan Stage IV papillary renal cell carcinoma with metastases She has advanced papillary renal cell carcinoma, status post right radical nephrectomy, stereotactic radiotherapy to brain metastasis, and palliative radiotherapy to soft tissue and nephrectomy bed. Current imaging demonstrates stable disease without evidence of progression or new metastatic lesions. She is asymptomatic from her malignancy. Chronic kidney disease, managed by nephrology, impacts ongoing surveillance and treatment. - Reviewed recent CT scans of chest, abdomen, and pelvis, which demonstrated stable disease. - Provided copy of scan results to her. - Reinforced importance of ongoing nephrology follow-up for management of chronic kidney disease. - Scheduled follow-up visit in six months. - Planned repeat CT scans of chest, abdomen, and pelvis and blood work at next visit. She was advised to call  immediately if she has any concerning symptoms in the interval.  The patient voices understanding of current disease status and treatment options and  is in agreement with the current care plan.  All questions were answered. The patient knows to call the clinic with any problems, questions or concerns. We can certainly see the patient much sooner if necessary.  The total time spent in the appointment was 30 minutes.  Disclaimer: This note was dictated with voice recognition software. Similar sounding words can inadvertently be transcribed and may not be corrected upon review.        "

## 2024-06-05 NOTE — Patient Instructions (Signed)
 Take 1 tablet today  only then Continue Warfarin 1 tablet daily except 1/2 tablet on Sundays and Thursdays. Greens 2 per week Recheck INR in 4 weeks.  Coumadin  Clinic [phone]

## 2024-06-09 ENCOUNTER — Telehealth: Payer: Self-pay | Admitting: *Deleted

## 2024-06-09 NOTE — Telephone Encounter (Signed)
 OT Recert, Progress Report & Updated Therapy Plan from [facility] signed by Dr Buckley & returned to fax #[phone].  Fax confirmation received.

## 2024-07-03 ENCOUNTER — Ambulatory Visit

## 2024-07-08 ENCOUNTER — Ambulatory Visit: Admitting: "Endocrinology

## 2024-07-24 ENCOUNTER — Ambulatory Visit: Admitting: Internal Medicine

## 2024-08-28 ENCOUNTER — Encounter

## 2024-11-25 ENCOUNTER — Inpatient Hospital Stay

## 2024-11-27 ENCOUNTER — Encounter

## 2024-12-04 ENCOUNTER — Inpatient Hospital Stay: Admitting: Internal Medicine

## 2024-12-30 ENCOUNTER — Inpatient Hospital Stay: Admitting: Internal Medicine

## 2025-02-26 ENCOUNTER — Encounter

## 2025-05-28 ENCOUNTER — Encounter

## 2025-08-27 ENCOUNTER — Encounter
